# Patient Record
Sex: Female | Born: 1970 | Race: White | Hispanic: No | Marital: Married | Smoking: Former smoker
Health system: Southern US, Community
[De-identification: ages and names within clinical notes are randomized; demographics above are authoritative.]

## PROBLEM LIST (undated history)

## (undated) DIAGNOSIS — K829 Disease of gallbladder, unspecified: Secondary | ICD-10-CM

## (undated) DIAGNOSIS — G4730 Sleep apnea, unspecified: Secondary | ICD-10-CM

## (undated) DIAGNOSIS — Z8489 Family history of other specified conditions: Secondary | ICD-10-CM

## (undated) DIAGNOSIS — E282 Polycystic ovarian syndrome: Secondary | ICD-10-CM

## (undated) DIAGNOSIS — E785 Hyperlipidemia, unspecified: Secondary | ICD-10-CM

## (undated) DIAGNOSIS — N979 Female infertility, unspecified: Secondary | ICD-10-CM

## (undated) DIAGNOSIS — K219 Gastro-esophageal reflux disease without esophagitis: Secondary | ICD-10-CM

## (undated) DIAGNOSIS — R112 Nausea with vomiting, unspecified: Secondary | ICD-10-CM

## (undated) DIAGNOSIS — E669 Obesity, unspecified: Secondary | ICD-10-CM

## (undated) DIAGNOSIS — L738 Other specified follicular disorders: Secondary | ICD-10-CM

## (undated) DIAGNOSIS — N946 Dysmenorrhea, unspecified: Secondary | ICD-10-CM

## (undated) DIAGNOSIS — Z9889 Other specified postprocedural states: Secondary | ICD-10-CM

## (undated) DIAGNOSIS — J189 Pneumonia, unspecified organism: Secondary | ICD-10-CM

## (undated) DIAGNOSIS — F32A Depression, unspecified: Secondary | ICD-10-CM

## (undated) DIAGNOSIS — I456 Pre-excitation syndrome: Secondary | ICD-10-CM

## (undated) DIAGNOSIS — Z87442 Personal history of urinary calculi: Secondary | ICD-10-CM

## (undated) DIAGNOSIS — M1990 Unspecified osteoarthritis, unspecified site: Secondary | ICD-10-CM

## (undated) DIAGNOSIS — R519 Headache, unspecified: Secondary | ICD-10-CM

## (undated) HISTORY — DX: Obesity, unspecified: E66.9

## (undated) HISTORY — PX: LAPAROSCOPIC CHOLECYSTECTOMY: SUR755

## (undated) HISTORY — DX: Other specified follicular disorders: L73.8

## (undated) HISTORY — DX: Disease of gallbladder, unspecified: K82.9

## (undated) HISTORY — DX: Hyperlipidemia, unspecified: E78.5

## (undated) HISTORY — DX: Dysmenorrhea, unspecified: N94.6

## (undated) HISTORY — DX: Female infertility, unspecified: N97.9

## (undated) HISTORY — PX: OTHER SURGICAL HISTORY: SHX169

## (undated) HISTORY — DX: Polycystic ovarian syndrome: E28.2

## (undated) HISTORY — PX: SLEEVE GASTROPLASTY: SHX1101

---

## 2006-04-14 ENCOUNTER — Ambulatory Visit: Payer: Self-pay | Admitting: Family Medicine

## 2006-05-19 DIAGNOSIS — I456 Pre-excitation syndrome: Secondary | ICD-10-CM | POA: Insufficient documentation

## 2006-05-19 DIAGNOSIS — F339 Major depressive disorder, recurrent, unspecified: Secondary | ICD-10-CM | POA: Insufficient documentation

## 2006-05-19 DIAGNOSIS — E282 Polycystic ovarian syndrome: Secondary | ICD-10-CM | POA: Insufficient documentation

## 2006-05-19 DIAGNOSIS — E669 Obesity, unspecified: Secondary | ICD-10-CM | POA: Insufficient documentation

## 2006-07-07 ENCOUNTER — Ambulatory Visit: Payer: Self-pay | Admitting: Family Medicine

## 2006-08-21 ENCOUNTER — Encounter: Payer: Self-pay | Admitting: Family Medicine

## 2006-08-21 LAB — CONVERTED CEMR LAB
AST: 20 units/L
Albumin: 3.9 g/dL
Alkaline Phosphatase: 88 units/L
BUN: 7 mg/dL
Calcium: 8.7 mg/dL
HCT: 44 %
Hemoglobin: 15.2 g/dL
LDL Cholesterol: 111 mg/dL
Potassium: 4.4 meq/L
Sodium: 144 meq/L

## 2006-08-24 ENCOUNTER — Ambulatory Visit: Payer: Self-pay | Admitting: Family Medicine

## 2006-09-02 ENCOUNTER — Ambulatory Visit: Payer: Self-pay | Admitting: Cardiology

## 2006-09-02 ENCOUNTER — Encounter: Payer: Self-pay | Admitting: Family Medicine

## 2006-09-09 ENCOUNTER — Ambulatory Visit: Payer: Self-pay | Admitting: Cardiology

## 2006-09-15 ENCOUNTER — Encounter: Payer: Self-pay | Admitting: Cardiology

## 2006-09-15 ENCOUNTER — Ambulatory Visit: Payer: Self-pay

## 2006-09-15 HISTORY — PX: OTHER SURGICAL HISTORY: SHX169

## 2006-10-02 ENCOUNTER — Encounter: Payer: Self-pay | Admitting: Family Medicine

## 2006-10-07 ENCOUNTER — Ambulatory Visit: Payer: Self-pay | Admitting: Family Medicine

## 2006-10-12 ENCOUNTER — Encounter: Payer: Self-pay | Admitting: Family Medicine

## 2006-11-04 ENCOUNTER — Telehealth: Payer: Self-pay | Admitting: Family Medicine

## 2006-11-04 ENCOUNTER — Ambulatory Visit: Payer: Self-pay | Admitting: Cardiology

## 2006-11-05 ENCOUNTER — Telehealth: Payer: Self-pay | Admitting: Family Medicine

## 2006-11-24 ENCOUNTER — Ambulatory Visit: Payer: Self-pay | Admitting: Internal Medicine

## 2006-12-03 ENCOUNTER — Ambulatory Visit: Payer: Self-pay | Admitting: Family Medicine

## 2007-11-02 ENCOUNTER — Encounter: Payer: Self-pay | Admitting: Family Medicine

## 2008-07-26 ENCOUNTER — Encounter: Payer: Self-pay | Admitting: Family Medicine

## 2008-08-31 ENCOUNTER — Ambulatory Visit: Payer: Self-pay | Admitting: Family Medicine

## 2008-08-31 LAB — CONVERTED CEMR LAB: Rapid Strep: NEGATIVE

## 2008-11-29 ENCOUNTER — Ambulatory Visit: Payer: Self-pay | Admitting: Family Medicine

## 2008-11-29 DIAGNOSIS — L919 Hypertrophic disorder of the skin, unspecified: Secondary | ICD-10-CM

## 2008-11-29 DIAGNOSIS — L909 Atrophic disorder of skin, unspecified: Secondary | ICD-10-CM | POA: Insufficient documentation

## 2009-04-20 ENCOUNTER — Ambulatory Visit: Payer: Self-pay | Admitting: Family Medicine

## 2009-05-16 ENCOUNTER — Encounter: Payer: Self-pay | Admitting: Family Medicine

## 2009-05-17 ENCOUNTER — Encounter: Payer: Self-pay | Admitting: Family Medicine

## 2009-05-18 ENCOUNTER — Encounter: Payer: Self-pay | Admitting: Family Medicine

## 2009-06-05 ENCOUNTER — Ambulatory Visit: Payer: Self-pay | Admitting: Family Medicine

## 2009-07-19 ENCOUNTER — Ambulatory Visit: Payer: Self-pay | Admitting: Family Medicine

## 2009-07-25 ENCOUNTER — Ambulatory Visit: Payer: Self-pay | Admitting: Family Medicine

## 2009-07-25 DIAGNOSIS — J029 Acute pharyngitis, unspecified: Secondary | ICD-10-CM | POA: Insufficient documentation

## 2009-07-25 DIAGNOSIS — M546 Pain in thoracic spine: Secondary | ICD-10-CM | POA: Insufficient documentation

## 2009-07-25 LAB — CONVERTED CEMR LAB: Rapid Strep: NEGATIVE

## 2009-07-26 ENCOUNTER — Encounter: Payer: Self-pay | Admitting: Family Medicine

## 2009-07-27 LAB — CONVERTED CEMR LAB
Alkaline Phosphatase: 72 units/L (ref 39–117)
BUN: 8 mg/dL (ref 6–23)
Basophils Absolute: 0 10*3/uL (ref 0.0–0.1)
Basophils Relative: 0 % (ref 0–1)
Creatinine, Ser: 0.87 mg/dL (ref 0.40–1.20)
Eosinophils Absolute: 0.1 10*3/uL (ref 0.0–0.7)
Eosinophils Relative: 2 % (ref 0–5)
Glucose, Bld: 97 mg/dL (ref 70–99)
Hemoglobin: 15.4 g/dL — ABNORMAL HIGH (ref 12.0–15.0)
MCHC: 34.4 g/dL (ref 30.0–36.0)
Monocytes Absolute: 0.4 10*3/uL (ref 0.1–1.0)
Neutro Abs: 4.7 10*3/uL (ref 1.7–7.7)
RDW: 12.5 % (ref 11.5–15.5)
Sodium: 137 meq/L (ref 135–145)
Total Bilirubin: 0.9 mg/dL (ref 0.3–1.2)

## 2009-07-31 ENCOUNTER — Telehealth: Payer: Self-pay | Admitting: Family Medicine

## 2009-08-14 ENCOUNTER — Encounter: Payer: Self-pay | Admitting: Family Medicine

## 2009-08-20 ENCOUNTER — Ambulatory Visit: Payer: Self-pay | Admitting: Family Medicine

## 2009-09-06 ENCOUNTER — Encounter: Payer: Self-pay | Admitting: Family Medicine

## 2009-09-07 LAB — CONVERTED CEMR LAB
Cholesterol: 163 mg/dL (ref 0–200)
HDL: 30 mg/dL — ABNORMAL LOW (ref >39)
Total CHOL/HDL Ratio: 5.4
Triglycerides: 182 mg/dL — ABNORMAL HIGH (ref <150)
VLDL: 36 mg/dL (ref 0–40)

## 2009-09-20 ENCOUNTER — Ambulatory Visit: Payer: Self-pay | Admitting: Family Medicine

## 2009-10-08 ENCOUNTER — Ambulatory Visit: Payer: Self-pay | Admitting: Family Medicine

## 2009-10-18 ENCOUNTER — Ambulatory Visit: Payer: Self-pay | Admitting: Family Medicine

## 2009-11-07 ENCOUNTER — Ambulatory Visit: Payer: Self-pay | Admitting: Internal Medicine

## 2009-11-13 ENCOUNTER — Encounter: Payer: Self-pay | Admitting: Family Medicine

## 2010-02-19 ENCOUNTER — Ambulatory Visit: Payer: Self-pay | Admitting: Family Medicine

## 2010-02-19 DIAGNOSIS — L0291 Cutaneous abscess, unspecified: Secondary | ICD-10-CM | POA: Insufficient documentation

## 2010-02-19 DIAGNOSIS — J209 Acute bronchitis, unspecified: Secondary | ICD-10-CM | POA: Insufficient documentation

## 2010-02-19 DIAGNOSIS — L0390 Cellulitis, unspecified: Secondary | ICD-10-CM

## 2010-03-06 ENCOUNTER — Ambulatory Visit: Payer: Self-pay | Admitting: Family Medicine

## 2010-03-06 DIAGNOSIS — D239 Other benign neoplasm of skin, unspecified: Secondary | ICD-10-CM | POA: Insufficient documentation

## 2010-04-23 ENCOUNTER — Ambulatory Visit: Payer: Self-pay | Admitting: Family Medicine

## 2010-04-23 DIAGNOSIS — J189 Pneumonia, unspecified organism: Secondary | ICD-10-CM | POA: Insufficient documentation

## 2010-05-01 ENCOUNTER — Ambulatory Visit: Payer: Self-pay | Admitting: Family Medicine

## 2010-05-01 ENCOUNTER — Encounter: Admission: RE | Admit: 2010-05-01 | Discharge: 2010-05-01 | Payer: Self-pay | Admitting: Family Medicine

## 2010-06-03 ENCOUNTER — Ambulatory Visit: Payer: Self-pay | Admitting: Family Medicine

## 2010-06-03 DIAGNOSIS — J309 Allergic rhinitis, unspecified: Secondary | ICD-10-CM | POA: Insufficient documentation

## 2010-06-03 DIAGNOSIS — J45909 Unspecified asthma, uncomplicated: Secondary | ICD-10-CM | POA: Insufficient documentation

## 2010-06-03 LAB — CONVERTED CEMR LAB
IgE (Immunoglobulin E), Serum: 66.6 intl units/mL (ref 0.0–180.0)
MCHC: 35.1 g/dL (ref 30.0–36.0)
MCV: 92 fL (ref 78.0–100.0)
Neutrophils Relative %: 66 % (ref 43–77)
Platelets: 252 10*3/uL (ref 150–400)

## 2010-09-10 NOTE — Assessment & Plan Note (Signed)
Summary: cough   Vital Signs:  Patient profile:   40 year old female Height:      65 inches Weight:      279 pounds BMI:     46.60 O2 Sat:      96 % on Room air Temp:     98.8 degrees F oral Pulse rate:   70 / minute BP sitting:   122 / 67  (left arm) Cuff size:   large  Vitals Entered By: Payton Spark CMA (May 01, 2010 9:57 AM)  O2 Flow:  Room air CC: Cough, chest congestion and fever.   Primary Care Provider:  Seymour Bars DO  CC:  Cough and chest congestion and fever.Marland Kitchen  History of Present Illness: Laura [name] is a 40 year-old female with a history of 4/5 of her children having pneumonia who was treated emperically for community acquired pneumonia last week.  She states that she has barely noticed any improvement of her symptoms after completing the anitbiotic course.  The patient notes that her fever has decreased some but still is having a lot of symptoms.  She says that she is starting to cough up yellowish phelgm, still feels very weak, has headaches, chills and chest tightness during her cough and breathing and a sore throat.  She denies any pain while breathing.   Current Medications (verified): 1)  Metformin Hcl 750 Mg Xr24h-Tab (Metformin Hcl) .... Take One Tablet By Mouth Twice Daily. 2)  Multivitamins   Tabs (Multiple Vitamin) .... Take One Tablet By Mouth Once Daily. 3)  Hydrocodone-Homatropine 5-1.5 Mg Tabs (Hydrocodone-Homatropine) .... 5 Ml By Mouth At Bedtime As Needed Cough  Allergies (verified): 1)  ! Pcn 2)  ! Neomycin  Past History:  Past Medical History: Reviewed history from 07/24/2008 and no changes required. 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  WPW FIRST DIAGNOSIS [id number]      SX WITH WPW:  RAPID HR, NO DYSPNEA, NO CHEST PAIN RECOMMENDATION AT [facility] 11/24/06 -- EP STUDY, RFCA OF WPW  --PT WILL LET us KNOW POLYCYSTIC OVARY DISEASE ALLERGIC RHINITIS DYSMENORRHEA DYSLIPIDEMIA OBESITY GALLBLADDER DISEASE INFERTILITY  [id number] - SINGLETON AND  QUADS!  Social History: Reviewed history from 07/24/2008 and no changes required. 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 QUIT SMOKING AFTER 5 PK/YR. UNHAPPY WITH WEIGHT GAIN.  NO EXERCISE.  DOES NOT WANT CONTRACEPTION.  Review of Systems      See HPI  Physical Exam  General:  alert, well-developed, well-nourished, and well-hydrated.   Head:  normocephalic and atraumatic.   Eyes:  conjunctiva clear Nose:  no nasal discharge.   Mouth:  pharynx pink and moist.   Neck:  no masses.   Lungs:  Normal respiratory effort, chest expands symmetrically. Lungs are clear to auscultation, no crackles, dry hacking cough with exp wheeze, nonlabored Heart:  normal rate, regular rhythm, and no murmur.   Skin:  color normal.   Cervical Nodes:  shotty tender submandibular LNs   Impression & Recommendations:  Problem # 1:  PNEUMONIA, ATYPICAL (ICD-486) Clinically, she was treated for atypical pneumonia last visit given her presentation and home exposure.  She took Zithromax from 9-13--> 9/18, so it may still be working for her.  Since she feels worse with SOB, will get a CXR today.  start on Prednisone to help with SOB + use the ProAir HFA given last time 4 x a day for the next wk.  F/U CXR results  this afternoon.   The following medications were removed from the medication list:    Zithromax Z-pak 250 Mg Tabs (Azithromycin) .Marland Kitchen... 2 tabs by mouth x 1 day then 1 tab by mouth daily x 4 days  Orders: T-DG Chest 2 View (71020)  Complete Medication List: 1)  Metformin Hcl 750 Mg Xr24h-tab (Metformin hcl) .... Take one tablet by mouth twice daily. 2)  Multivitamins Tabs (Multiple vitamin) .... Take one tablet by mouth once daily. 3)  Hydrocodone-homatropine 5-1.5 Mg Tabs (Hydrocodone-homatropine) .... 5 ml by mouth at bedtime as needed cough 4)  Prednisone 20 Mg Tabs (Prednisone) .... 2 tabs by mouth qam x 5 days 5)  Mucinex 600 Mg  Xr12h-tab (Guaifenesin) .... 2 tabs by mouth q 12 hrs as needed cough  Patient Instructions: 1)  CXR downstairs today. 2)  Will call you w/ results this afternoon. 3)  Add additional antibiotics if indicated. 4)  Start on Prednsione 40 mg every AM for shortness of breath x 5 days. 5)  Use Inhaler 2 puffs 4 x a day for the next wk. 6)  Use Mucinex 1-2 tabs every 12 hrs for chest congestion. Prescriptions: PREDNISONE 20 MG TABS (PREDNISONE) 2 tabs by mouth qAM x 5 days  #10 x 0   Entered and Authorized by:   Seymour Bars DO   Signed by:   Seymour Bars DO on 05/01/2010   Method used:   Electronically to        [facility] [phone]* (retail)       [address] [place], [place]  [address]       Ph: [phone] or [phone]       Fax: [phone]   RxID:   [id number]

## 2010-09-10 NOTE — Assessment & Plan Note (Signed)
Summary: mole removal   Vital Signs:  Patient profile:   40 year old female Height:      65 inches Weight:      272 pounds BMI:     45.43 O2 Sat:      97 % on Room air Pulse rate:   75 / minute BP sitting:   103 / 62  (left arm) Cuff size:   large  Vitals Entered By: Payton Spark CMA (March 06, 2010 9:57 AM)  O2 Flow:  Room air CC: Mole removal    CC:  Mole removal .  Current Medications (verified): 1)  Metformin Hcl 750 Mg Xr24h-Tab (Metformin Hcl) .... Take One Tablet By Mouth Twice Daily. 2)  Multivitamins   Tabs (Multiple Vitamin) .... Take One Tablet By Mouth Once Daily.  Allergies (verified): 1)  ! Pcn 2)  ! Neomycin   Impression & Recommendations:  Problem # 1:  NEVUS, ATYPICAL (ICD-216.9)  Shave biopsy done today w/o complication. F/U path results on Monday. Local wound care discussed.  Call if any problems with bleeding, redness, pus, fevers.  Orders: Shave Skin Lesion 0.6-1.0 cm/trunk/arm/leg (11301)  Complete Medication List: 1)  Metformin Hcl 750 Mg Xr24h-tab (Metformin hcl) .... Take one tablet by mouth twice daily. 2)  Multivitamins Tabs (Multiple vitamin) .... Take one tablet by mouth once daily.  Procedure Note  Biopsy: Biopsy Type: Skin The patient complains of irritation and changing lesion but denies pain and fever. Indication: changing lesion Consent signed: yes  Procedure # 1: shave biopsy    Size (in cm): 0.5 x 0.6    Region: L upper back    Comment: Changing raised mole, itchy x months.  Lesion cleaned with betadine and alchohol.  1.5 cc of 1% lidocain with epi placed for local anesthesia.  Lesion easily removed with 15 blade.  Hemostasis achieved with aluminum chloride.   Bactroban and a bandage applied.    Instrument used: #15 blade    Anesthesia: 1% lidocaine w/epinephrine [id number] ex 11/12  Cleaned and prepped with: alcohol and betadine Wound dressing: bacitracin and bandaid Instructions: daily dressing changes Additional  Instructions: Lesion sent to path.  Will f/u results next wk.  Clean daily soap and water.  Cover with bacitracin and a bandaid x 10 days.  Call if any sign of infection.

## 2010-09-10 NOTE — Letter (Signed)
Summary: [facility]  [facility]   Imported By: Lanelle Bal 12/05/2009 08:51:04  _____________________________________________________________________  External Attachment:    Type:   Image     Comment:   External Document

## 2010-09-10 NOTE — Assessment & Plan Note (Signed)
Summary: WEIGHT CHECK  Nurse Visit   Vital Signs:  Patient profile:   40 year old female Weight:      265 pounds BMI:     44.26 Pulse rate:   75 / minute BP sitting:   116 / 64  Vitals Entered By: Payton Spark CMA (October 18, 2009 9:04 AM)  Pt states she just restarted phentermine.  Allergies: 1)  ! Pcn 2)  ! Neomycin  Orders Added: 1)  Est. Patient Level I [82956]    Impression & Recommendations:  Problem # 1:  OBESITY, NOS (ICD-278.00) Weight unchanged.  BP good.  I will not RF her Phentermine due to lack of proven wt loss.  Orders: Est. Patient Level I (21308)  Complete Medication List: 1)  Phentermine Hcl 37.5 Mg Caps (Phentermine hcl) .Marland Kitchen.. 1 capsule by mouth daily on empty stomach 2)  Zithromax Z-pak 250 Mg Tabs (Azithromycin) .... 2 tabs by mouth x 1 day then 1 tab by mouth daily x 4 days   Appended Document: WEIGHT CHECK LMOM informing Pt

## 2010-09-10 NOTE — Assessment & Plan Note (Signed)
Summary: NURSE VISIT- weight check  Nurse Visit   Vital Signs:  Patient profile:   40 year old female Height:      65 inches Weight:      269 pounds  Vitals Entered By: Payton Spark CMA (August 20, 2009 9:05 AM)  Allergies: 1)  ! Pcn 2)  ! Neomycin  Orders Added: 1)  Est. Patient Level I [27253] 2)  T-Lipid Profile [[id number]] Prescriptions: PHENTERMINE HCL 37.5 MG CAPS (PHENTERMINE HCL) 1 capsule by mouth daily on empty stomach  #30 x 0   Entered and Authorized by:   Seymour Bars DO   Signed by:   Seymour Bars DO on 08/20/2009   Method used:   Printed then faxed to ...       [facility]  [place] [phone][facility] (retail)       [address] [place], [place]  [address]       Ph: [phone] or [phone]       Fax: [phone]   RxID:   [id number]    Weight check.   Impression & Recommendations:  Problem # 1:  OBESITY, NOS (ICD-278.00) Weight improved from 275--> 269 in 1 month with a good BP. Continue Phentermine for month #2. Schedule OV in 1 month.  Complete Medication List: 1)  Phentermine Hcl 37.5 Mg Caps (Phentermine hcl) .Marland Kitchen.. 1 capsule by mouth daily on empty stomach 2)  Betamethasone Valerate 0.1 % Oint (Betamethasone valerate) .... Apply to hand rash two times a day x 10 days 3)  Dukes Magic Mouthwash  .Marland Kitchen.. 10 ml swish and gargle 4 x a day as needed  Other Orders: T-Lipid Profile ([id number])   Patient Instructions: 1)  6 # lost from 12/9. 2)  275--> 269. 3)  Stay on Phentermine. 4)  Work on [employer], regular exercise 1 hr 5 days/ wk. 5)  Return for an office visit in 1 month.

## 2010-09-10 NOTE — Assessment & Plan Note (Signed)
Summary: dx:wpw/lg   Visit Type:  Follow-up Primary Provider:  Seymour Bars DO   History of Present Illness: Laura [name] returns today for additional evaluation and management of WPW syndrome.  I saw her last two years ago when she was referred by Dr. Jens Som.  She has longstanding palpitations and documented WPW for over 25 yrs.  She has recently become more symptomatic and presented to the outpatient center two weeks ago with SVT which ultimately converted before an ECG could be obtained.  She now would like to proceed with ablation.  Current Medications (verified): 1)  Metformin Hcl 750 Mg Xr24h-Tab (Metformin Hcl) .... Take One Tablet By Mouth Twice Daily. 2)  Multivitamins   Tabs (Multiple Vitamin) .... Take One Tablet By Mouth Once Daily.  Allergies (verified): 1)  ! Pcn 2)  ! 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  WPW FIRST DIAGNOSIS [phone]      SX WITH WPW:  RAPID HR, NO DYSPNEA, NO CHEST PAIN RECOMMENDATION AT [facility] 11/24/06 -- EP STUDY, RFCA OF WPW  --PT WILL LET us KNOW POLYCYSTIC OVARY DISEASE ALLERGIC RHINITIS DYSMENORRHEA DYSLIPIDEMIA OBESITY GALLBLADDER DISEASE INFERTILITY  [id number] - SINGLETON AND QUADS!  Past Surgical History: Last updated: 07/24/2008 lap chole  LTCS x 2  Review of Systems  The patient denies chest pain, syncope, dyspnea on exertion, and peripheral edema.    Vital Signs:  Patient profile:   40 year old female Height:      65 inches Weight:      266 pounds BMI:     44.42 Pulse rate:   84 / minute BP sitting:   102 / 80  (left arm)  Vitals Entered By: Laurance Flatten CMA (November 07, 2009 10:19 AM)  Physical Exam  General:  alert, appropriate dress, cooperative to examination, and overweight-appearing.   Head:  normocephalic and atraumatic.   Eyes:  conjunctiva clear Mouth:  pharyngeal erythema and postnasal drip.   Neck:  no masses.   Lungs:  normal respiratory  effort, no accessory muscle use,  Even chest expansion. normal respiratory effort, no accessory muscle use, normal breath sounds, dry cough with slight exp wheeze.   Heart:  RRR w/o tachycardia. Normal S1 and S2.  PMI is not enlarged or laterally displaced. Abdomen:  Bowel sounds positive,abdomen soft and non-tender without masses, organomegaly or hernias noted. Pulses:  R radial normal and L radial normal.   Extremities:  trace pedal edema Neurologic:  no tremor   EKG  Procedure date:  11/07/2009  Findings:      Normal sinus rhythm with rate of:  84 with WPW syndrome.  Impression & Recommendations:  Problem # 1:  WOLFF (WOLFE)-PARKINSON-WHITE (WPW) SYNDROME (ICD-426.7) The patient remains symptomatic. She wishes to proceed with ablation. I have discussed the risks/benefits/goals/expectations of EPS/RFA of WPW and she wishes to proceed.

## 2010-09-10 NOTE — Assessment & Plan Note (Signed)
Summary: f/u wt   Vital Signs:  Patient profile:   40 year old female Height:      65 inches Weight:      265 pounds BMI:     44.26 O2 Sat:      97 % on Room air Temp:     98.7 degrees F oral Pulse rate:   90 / minute BP sitting:   118 / 72  (right arm) Cuff size:   large  Vitals Entered By: Payton Spark CMA/april (September 20, 2009 10:57 AM)  O2 Flow:  Room air CC: f/u on weight   Primary Care Provider:  Seymour Bars DO  CC:  f/u on weight.  History of Present Illness: Laura [name] presents for f/u weight managment.  Her weight is down from 288 in Sept 2010.  She feels like she has more control over her eating habits.  She has cut back on portion sizes.  She has started going to the gym.  Her energy level has improved.  Eating less junk.  She has done a version of the [facility] Diet.  Denies palpitations, insomnia or tremor.  She has been on Phentermine for 2 mos now.  She was 135 prior to her pregnancy with the Quads.      Allergies: 1)  ! Pcn 2)  ! Neomycin  Past History:  Past Medical History: Reviewed history from 07/24/2008 and no changes required. 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  WPW FIRST DIAGNOSIS [phone]      SX WITH WPW:  RAPID HR, NO DYSPNEA, NO CHEST PAIN RECOMMENDATION AT [facility] 11/24/06 -- EP STUDY, RFCA OF WPW  --PT WILL LET us KNOW POLYCYSTIC OVARY DISEASE ALLERGIC RHINITIS DYSMENORRHEA DYSLIPIDEMIA OBESITY GALLBLADDER DISEASE INFERTILITY  [id number] - SINGLETON AND QUADS!  Social History: Reviewed history from 07/24/2008 and no changes required. 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 QUIT SMOKING AFTER 5 PK/YR. UNHAPPY WITH WEIGHT GAIN.  NO EXERCISE.  DOES NOT WANT CONTRACEPTION.  Review of Systems      See HPI  Physical Exam  General:  alert, well-developed, well-nourished, and well-hydrated.  obese Head:  normocephalic and atraumatic.   Mouth:  pharynx pink and moist.     Neck:  no masses.   Lungs:  Normal respiratory effort, chest expands symmetrically. Lungs are clear to auscultation, no crackles or wheezes. Heart:  Normal rate and regular rhythm. S1 and S2 normal without gallop, murmur, click, rub or other extra sounds. Neurologic:  no tremor Psych:  good eye contact, not anxious appearing, and not depressed appearing.     Impression & Recommendations:  Problem # 1:  OBESITY, NOS (ICD-278.00) Co Morbidities include acid reflux, back pain, PCOS.  BMI is down to 44.  Still class III obesity.  Wt loss has been slow, down 15 lbs in 5 mos.  Just completed 2nd month of Phentermine which has helped curb her appetite w/o adverse SE.  Will continue.  RTC next month for wt/ BP check.  Needs to step up the exercise.  Cotninue healthy diet, small portions, low carb.  Complete Medication List: 1)  Phentermine Hcl 37.5 Mg Caps (Phentermine hcl) .Marland Kitchen.. 1 capsule by mouth daily on empty stomach 2)  Betamethasone Valerate 0.1 % Oint (Betamethasone valerate) .... Apply to hand rash two times a day x 10 days 3)  Dukes Magic Mouthwash  .Marland Kitchen.. 10 ml swish and gargle 4 x a day as needed  Patient Instructions: 1)  RF Phentermine. 2)  Keep up the good work with low carb diet, sticking to  ~1400 kcal/ day.   3)  Try to exercise 1 hr (cardio + wts) 4 days/ wk. 4)  F/U in 1 month for a weight check. Prescriptions: PHENTERMINE HCL 37.5 MG CAPS (PHENTERMINE HCL) 1 capsule by mouth daily on empty stomach  #30 x 0   Entered and Authorized by:   Seymour Bars DO   Signed by:   Seymour Bars DO on 09/20/2009   Method used:   Printed then faxed to ...       [facility]  [place] [phone][facility] (retail)       [address] [place], [place]  [address]       Ph: [phone] or [phone]       Fax: [phone]   RxID:   [id number]

## 2010-09-10 NOTE — Assessment & Plan Note (Signed)
Summary: bronchitis/ boil   Vital Signs:  Patient profile:   40 year old female Height:      65 inches Weight:      269 pounds BMI:     44.93 O2 Sat:      96 % on Room air Temp:     98.9 degrees F oral Pulse rate:   84 / minute BP sitting:   107 / 62  (left arm) Cuff size:   large  Vitals Entered By: Payton Spark CMA (February 19, 2010 11:42 AM)  O2 Flow:  Room air CC: Chest congestion, boil on L breast and check mole.    Primary Care Provider:  Seymour Bars DO  CC:  Chest congestion and boil on L breast and check mole. Marland Kitchen  History of Present Illness: 40 yo WF presents for chest congestion and cough x 5 days.  No sore throat or runny nose.  Had a fever last night - subjective with sweating at night.  Having chills.  No production with cough.  Has some chest tightness.  No SOB.  She is taking only Advil.  has a HA.  Denies any rash or sick contacts.  Denies sinus pressure.  She has a boil on her L breast under the nipple that started about 12 days ago.  It has ruptured and is draining clear fluid.  It is slightly tender.  She is keeping it clean and using topical bacitracin ointment.  She also has a irritating mole on her back.  Current Medications (verified): 1)  Metformin Hcl 750 Mg Xr24h-Tab (Metformin Hcl) .... Take One Tablet By Mouth Twice Daily. 2)  Multivitamins   Tabs (Multiple Vitamin) .... Take One Tablet By Mouth Once Daily.  Allergies (verified): 1)  ! Pcn 2)  ! Neomycin  Past History:  Past Medical History: Reviewed history from 07/24/2008 and no changes required. 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  WPW FIRST DIAGNOSIS [phone]      SX WITH WPW:  RAPID HR, NO DYSPNEA, NO CHEST PAIN RECOMMENDATION AT [facility] 11/24/06 -- EP STUDY, RFCA OF WPW  --PT WILL LET us KNOW POLYCYSTIC OVARY DISEASE ALLERGIC RHINITIS DYSMENORRHEA DYSLIPIDEMIA OBESITY GALLBLADDER DISEASE INFERTILITY  [id number] - SINGLETON AND QUADS!  Past Surgical History: Reviewed history from 07/24/2008  and no changes required. lap chole  LTCS x 2  Social History: Reviewed history from 07/24/2008 and no changes required. 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 QUIT SMOKING AFTER 5 PK/YR. UNHAPPY WITH WEIGHT GAIN.  NO EXERCISE.  DOES NOT WANT CONTRACEPTION.  Review of Systems      See HPI  Physical Exam  General:  alert, appropriate dress, cooperative to examination, and overweight-appearing.   Head:  normocephalic and atraumatic.  sinuses NTTP Eyes:  conjunctiva clear Ears:  EACs patent; TMs translucent and gray with good cone of light and bony landmarks.  Nose:  no nasal discharge.   Mouth:  pharynx pink and moist.   Neck:  no masses.   Chest Wall:  no tenderness.   Lungs:  Normal respiratory effort, chest expands symmetrically. Lungs are clear to auscultation, no crackles or wheezes.  + bibasilar rhonchi with cough Heart:  normal rate, regular rhythm, and no murmur.   Skin:  color normal.   healing abscess under the L areola, scaley and pink, draining clear fluid flesh colored raised mole on the L upper back. Cervical Nodes:  No lymphadenopathy noted   Impression &  Recommendations:  Problem # 1:  ACUTE BRONCHITIS (ICD-466.0) Will cover both bronchitis (though probably viral) + abscess with Doxy x 7 days.  Work on supportive care measures as listed under pt instructions.  Call if cough / breathing worsens or not improved in 7 days. Her updated medication list for this problem includes:    Doxycycline Hyclate 100 Mg Tabs (Doxycycline hyclate) .Marland Kitchen... 1 tab by mouth two times a day x 7 days  Problem # 2:  CELLULITIS AND ABSCESS OF UNSPECIFIED SITE (ICD-682.9) Spontaneously draining abscess on L breast w/o erthema or pain.  Cover with 7 days of Doxy, keep clean with soap and water.  Call if any worsening. Her updated medication list for this problem includes:    Doxycycline Hyclate 100 Mg Tabs (Doxycycline  hyclate) .Marland Kitchen... 1 tab by mouth two times a day x 7 days  Complete Medication List: 1)  Metformin Hcl 750 Mg Xr24h-tab (Metformin hcl) .... Take one tablet by mouth twice daily. 2)  Multivitamins Tabs (Multiple vitamin) .... Take one tablet by mouth once daily. 3)  Doxycycline Hyclate 100 Mg Tabs (Doxycycline hyclate) .Marland Kitchen.. 1 tab by mouth two times a day x 7 days  Patient Instructions: 1)  Take Doxycycline 2 x a day x 7 days for bronchitis and healing breast abscess. 2)  Keep clean with soap and water and use topical bacitracin. 3)  OK to use OTC Tylenol Cold and Flu for symptomatic relief. 4)  Call if not improved in 7 days. 5)  Schedule MOLE REMOVAL ( 30 min visit) in 2 wks. Prescriptions: DOXYCYCLINE HYCLATE 100 MG TABS (DOXYCYCLINE HYCLATE) 1 tab by mouth two times a day x 7 days  #14 x 0   Entered and Authorized by:   Seymour Bars DO   Signed by:   Seymour Bars DO on 02/19/2010   Method used:   Electronically to        [facility] [phone]* (retail)       [address] [place], [place]  [address]       Ph: [phone] or [phone]       Fax: [phone]   RxID:   [id number]

## 2010-09-10 NOTE — Assessment & Plan Note (Signed)
Summary: cough   Vital Signs:  Patient profile:   40 year old female Height:      65 inches Weight:      284 pounds BMI:     47.43 O2 Sat:      97 % on Room air Temp:     97.9 degrees F oral Pulse rate:   72 / minute BP sitting:   123 / 69  (left arm) Cuff size:   large  Vitals Entered By: Payton Spark CMA (June 03, 2010 10:43 AM)  O2 Flow:  Room air  Serial Vital Signs/Assessments:  Comments: 11:15 AM Peak Flows [address] By: Payton Spark CMA   CC: Chest congestion and cough   Primary Care Provider:  Seymour Bars DO  CC:  Chest congestion and cough.  History of Present Illness: 40 yo WF presents for a reoccurence of chest congestion.  She started on Mucinex.  She has a dry cough.  This started 2 days ago.  No fevers or chills.  Has fatigue.  She has some nasal congestion.  No sore throat.  Denies SOB or wheezing.  She is not taking anything for allergies.  She was tested 2-3 yrs ago.  She was treated with antibiotics in the past 4 wks for atypical pneumonia and did improve.  She also took a Prednisone burst which helped.  Denies sputum production or hemoptysis.  She is not a smoker.  Had a CXR done recently that was c/w a viral pneumonia picture.     Current Medications (verified): 1)  Metformin Hcl 750 Mg Xr24h-Tab (Metformin Hcl) .... Take One Tablet By Mouth Twice Daily. 2)  Multivitamins   Tabs (Multiple Vitamin) .... Take One Tablet By Mouth Once Daily. 3)  Hydrocodone-Homatropine 5-1.5 Mg Tabs (Hydrocodone-Homatropine) .... 5 Ml By Mouth At Bedtime As Needed Cough 4)  Mucinex 600 Mg Xr12h-Tab (Guaifenesin) .... 2 Tabs By Mouth Q 12 Hrs As Needed Cough  Allergies (verified): 1)  ! Pcn 2)  ! Neomycin  Past History:  Past Medical History: Reviewed history from 07/24/2008 and no changes required. 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  WPW FIRST DIAGNOSIS [phone]      SX WITH WPW:  RAPID HR, NO DYSPNEA, NO CHEST PAIN RECOMMENDATION AT [facility] 11/24/06 --  EP STUDY, RFCA OF WPW  --PT WILL LET us KNOW POLYCYSTIC OVARY DISEASE ALLERGIC RHINITIS DYSMENORRHEA DYSLIPIDEMIA OBESITY GALLBLADDER DISEASE INFERTILITY  [id number] - SINGLETON AND QUADS!  Social History: Reviewed history from 07/24/2008 and no changes required. 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 QUIT SMOKING AFTER 5 PK/YR. UNHAPPY WITH WEIGHT GAIN.  NO EXERCISE.  DOES NOT WANT CONTRACEPTION.  Review of Systems      See HPI  Physical Exam  General:  alert, well-developed, well-nourished, and well-hydrated.  obese Head:  normocephalic and atraumatic.   Eyes:  conjunctiva clear Ears:  EACs patent; TMs translucent and gray with good cone of light and bony landmarks.  Nose:  nasal congestion with red boggy turbinates present Mouth:  o/p injected diffusely with postnasal drip Neck:  no masses.   Chest Wall:  no tenderness.   Lungs:  dry hacking cough, normal respiratory effort, no accessory muscle use, normal breath sounds, no dullness, no crackles, and no wheezes.   Heart:  normal rate, regular rhythm, and no murmur.   Skin:  color normal.   Cervical Nodes:  No lymphadenopathy noted   Impression & Recommendations:  Problem # 1:  BRONCHITIS, ALLERGIC (ICD-493.90)  Allergies explains why she has continued to have congestion and cough for > 1 month now.  I do think that she has an infectious source at the onset but this was adequately treated with Zithromax and then steroids and her f/u CXR was + for a viral process.  Her PFs are in the green zone today so I held off on inhalers or more steroids.  I think that Mucinex DM and Zyrtec are OK.  She may need to go back to the allergies for PFTs/ allergy testing.    CBC done todya just to r/o a left shift since she has household contact with PNA and an upcoming trip to [place]. Call back if feeling any worse.   The following medications were removed from the medication  list:    Prednisone 20 Mg Tabs (Prednisone) .Marland Kitchen... 2 tabs by mouth qam x 5 days  Orders: Peak Flow Rate (94150)  Complete Medication List: 1)  Metformin Hcl 750 Mg Xr24h-tab (Metformin hcl) .... Take one tablet by mouth twice daily. 2)  Multivitamins Tabs (Multiple vitamin) .... Take one tablet by mouth once daily. 3)  Hydrocodone-homatropine 5-1.5 Mg Tabs (Hydrocodone-homatropine) .... 5 ml by mouth at bedtime as needed cough 4)  Mucinex 600 Mg Xr12h-tab (Guaifenesin) .... 2 tabs by mouth q 12 hrs as needed cough  Other Orders: T-CBC w/Diff ([id number]) T-Immunoglobulin E ([id number])  Patient Instructions: 1)  Will get labs today to help guide treatment: viral vs allergic vs bacterial etiology of your bronchitis. 2)  Will call you w/ results tonight. 3)  Use Mucinex DM for cough/ chest congestion with Zyrtec in the evenings for allergies.     Orders Added: 1)  T-CBC w/Diff [[id number]] 2)  T-Immunoglobulin E [[id number]] 3)  Est. Patient Level III [75643] 4)  Peak Flow Rate [94150]

## 2010-09-10 NOTE — Assessment & Plan Note (Signed)
Summary: flu   Vital Signs:  Patient profile:   40 year old female Height:      65 inches Weight:      265 pounds BMI:     44.26 O2 Sat:      97 % on Room air Temp:     98.5 degrees F oral Pulse rate:   81 / minute BP sitting:   106 / 71  (left arm) Cuff size:   large  Vitals Entered By: Payton Spark CMA (October 08, 2009 10:56 AM)  O2 Flow:  Room air CC: ? FLU. Body aches x 4-5 days   Primary Care Provider:  Seymour Bars DO  CC:  ? FLU. Body aches x 4-5 days.  History of Present Illness: 79 yr WF presents with flu-like symptoms. Headache, sore throat, body aches, chest congestion, and cough x5 days. She has 5 children at home that had the flu last week. She had a fever Thursday and Friday and took ibuprofen. She has taken dayquil daily since Thrusday.  On Friday she took Nyquil which caused her heart to speed up. History of WPW. Marland Kitchen She went to Urgent care Sunday and was not seen, they sent her to the ER. While in route to the ER, she felt her HR slow down and went home. She denies chest pain or SOB.  She say Dr Ladona Ridgel at [facility] in [facility] but wanted to put off ablation.    Current Medications (verified): 1)  Phentermine Hcl 37.5 Mg Caps (Phentermine Hcl) .Marland Kitchen.. 1 Capsule By Mouth Daily On Empty Stomach  Allergies (verified): 1)  ! Pcn 2)  ! Neomycin  Past History:  Past Medical History: Reviewed history from 07/24/2008 and no changes required. 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  WPW FIRST DIAGNOSIS [phone]      SX WITH WPW:  RAPID HR, NO DYSPNEA, NO CHEST PAIN RECOMMENDATION AT [facility] 11/24/06 -- EP STUDY, RFCA OF WPW  --PT WILL LET us KNOW POLYCYSTIC OVARY DISEASE ALLERGIC RHINITIS DYSMENORRHEA DYSLIPIDEMIA OBESITY GALLBLADDER DISEASE INFERTILITY  [id number] - SINGLETON AND QUADS!  Family History: Reviewed history from 07/24/2008 and no changes required.   BROTHER AND SISTER WITH THYROID DISEASE FATHER HTN MOTHER COLON CA AT 57, AMI LATE 50'S, DM,CVA,  DEPRESSION, BREAST CA  Social History: Reviewed history from 07/24/2008 and no changes required. 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 QUIT SMOKING AFTER 5 PK/YR. UNHAPPY WITH WEIGHT GAIN.  NO EXERCISE.  DOES NOT WANT CONTRACEPTION.  Review of Systems      See HPI  Physical Exam  General:  alert, appropriate dress, cooperative to examination, and overweight-appearing.   Head:  normocephalic and atraumatic.   Eyes:  conjunctiva clear Ears:  L ear normal and R TM with pronounced blood vessel (pinpoint) behind the TM. Nose:  nasal congestion Mouth:  pharyngeal erythema and postnasal drip.   Neck:  no masses.   Lungs:  normal respiratory effort, no accessory muscle use,  Even chest expansion. normal respiratory effort, no accessory muscle use, normal breath sounds, dry cough with slight exp wheeze.   Heart:  RRR w/o tachycardia Pulses:  R radial normal and L radial normal.   Skin:  color normal.   Cervical Nodes:  no anterior cervical adenopathy and no posterior cervical adenopathy.   Psych:  Oriented X3 and good eye contact.     Impression & Recommendations:  Problem # 1:  INFLUENZA, WITH RESPIRATORY SYMPTOMS (  ICD-487.1) Assessment New Clinically, appears to be at the tail end of influenza like illness, complicated by dry hacking cough and secondary WPW tachycardia from cold meds.  Due to this, will go ahead and treat her with Zithromax x 5 days.  Avoid systemic decongestants.  Call if not improved in 7 days. Zithromax x 5 days. OTC Robitussin DM and Tyelnol/Advil PRN.  Problem # 2:  WOLFF (WOLFE)-PARKINSON-WHITE (WPW) SYNDROME (ICD-426.7) Will see Dr. Ladona Ridgel to discuss possible ablation.  Last seen 20008.  Stable today.  Complete Medication List: 1)  Phentermine Hcl 37.5 Mg Caps (Phentermine hcl) .Marland Kitchen.. 1 capsule by mouth daily on empty stomach 2)  Zithromax Z-pak 250 Mg Tabs (Azithromycin) .... 2 tabs by mouth x  1 day then 1 tab by mouth daily x 4 days  Patient Instructions: 1)  Take Zithromax x 5 days. 2)  Take OTC Robitussin DM and Tyelnol or Advil as needed for remaining symptoms. 3)  Will get you back in with Dr Ladona Ridgel for WPW. Prescriptions: ZITHROMAX Z-PAK 250 MG TABS (AZITHROMYCIN) 2 tabs by mouth x 1 day then 1 tab by mouth daily x 4 days  #1 pack x 0   Entered and Authorized by:   Seymour Bars DO   Signed by:   Seymour Bars DO on 10/08/2009   Method used:   [facility]        [phone][facility] (retail)       [address] [place], [place]  [address]       Ph: [phone] or [phone]       Fax: [phone]   RxID:   [id number]

## 2010-09-10 NOTE — Consult Note (Signed)
Summary: [facility]  [facility]   Imported By: Lanelle Bal 08/23/2009 12:31:45  _____________________________________________________________________  External Attachment:    Type:   Image     Comment:   External Document

## 2010-09-10 NOTE — Miscellaneous (Signed)
Summary: Consent to Special Procedure  Consent to Special Procedure   Imported By: Lanelle Bal 03/15/2010 11:25:34  _____________________________________________________________________  External Attachment:    Type:   Image     Comment:   External Document

## 2010-09-10 NOTE — Assessment & Plan Note (Signed)
Summary: atypical pneumonia   Vital Signs:  Patient profile:   40 year old female Height:      65 inches Weight:      278 pounds BMI:     46.43 O2 Sat:      96 % on Room air Temp:     98.2 degrees F oral Pulse rate:   79 / minute BP sitting:   117 / 65  (left arm) Cuff size:   large  Vitals Entered By: Payton Spark CMA (April 23, 2010 9:54 AM)  O2 Flow:  Room air CC: fever, cough and ST. Children all w/ strep and pneumonia   Primary Care Aidin Doane:  Seymour Bars DO  CC:  fever and cough and ST. Children all w/ strep and pneumonia.  History of Present Illness: 40 yo WF presents for fever, sore throat, cough and bodyaches x 1 day. Has some runny nose that started today.  Her cough is deep but dry.  She has chest tightness and SOB.  Several of her kids have pneumonia and ST.  She took Delsym last night it did not help.  Taking Advil for fevers.  She has malaise.  Had a migraine last wk.      Current Medications (verified): 1)  Metformin Hcl 750 Mg Xr24h-Tab (Metformin Hcl) .... Take One Tablet By Mouth Twice Daily. 2)  Multivitamins   Tabs (Multiple Vitamin) .... Take One Tablet By Mouth Once Daily.  Allergies (verified): 1)  ! Pcn 2)  ! Neomycin  Past History:  Past Medical History: Reviewed history from 07/24/2008 and no changes required. 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  WPW FIRST DIAGNOSIS [phone]      SX WITH WPW:  RAPID HR, NO DYSPNEA, NO CHEST PAIN RECOMMENDATION AT [facility] 11/24/06 -- EP STUDY, RFCA OF WPW  --PT WILL LET us KNOW POLYCYSTIC OVARY DISEASE ALLERGIC RHINITIS DYSMENORRHEA DYSLIPIDEMIA OBESITY GALLBLADDER DISEASE INFERTILITY  [id number] - SINGLETON AND QUADS!  Past Surgical History: Reviewed history from 07/24/2008 and no changes required. lap chole  LTCS x 2  Social History: Reviewed history from 07/24/2008 and no changes required. 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 QUIT  SMOKING AFTER 5 PK/YR. UNHAPPY WITH WEIGHT GAIN.  NO EXERCISE.  DOES NOT WANT CONTRACEPTION.  Review of Systems      See HPI  Physical Exam  General:  alert, well-developed, well-nourished, and well-hydrated.  obese Head:  normocephalic and atraumatic.   Eyes:  conjunctiva clear Ears:  retracted R TM, o/w normal bilat Nose:  scant  nasal congestion with boggy turbinates Mouth:  o/p injected w/o exudates or vesicles Neck:  no masses.   Lungs:  Normal respiratory effort, chest expands symmetrically. Lungs are clear to auscultation, no crackles, dry hacking cough with exp wheeze, nonlabored Heart:  normal rate, regular rhythm, and no murmur.   Skin:  color normal.   Cervical Nodes:  shotty anterior cervical chain LA   Impression & Recommendations:  Problem # 1:  PNEUMONIA, ATYPICAL (ICD-486) Will treat for presumed atypical PNA given her exposure at home and constellation of symptoms. Treat with 5 days of Zithromax, supportive care and nighttime Hycodan.  RX ProAIr sample given to use 2 puffs 4 x a day for the next wk for wheezing and SOB.   Call if not starting to improve in 48 hrs or if feeling worse.   Her updated medication list for this problem includes:    Zithromax  Z-pak 250 Mg Tabs (Azithromycin) .Marland Kitchen... 2 tabs by mouth x 1 day then 1 tab by mouth daily x 4 days  Complete Medication List: 1)  Metformin Hcl 750 Mg Xr24h-tab (Metformin hcl) .... Take one tablet by mouth twice daily. 2)  Multivitamins Tabs (Multiple vitamin) .... Take one tablet by mouth once daily. 3)  Zithromax Z-pak 250 Mg Tabs (Azithromycin) .... 2 tabs by mouth x 1 day then 1 tab by mouth daily x 4 days 4)  Hydrocodone-homatropine 5-1.5 Mg Tabs (Hydrocodone-homatropine) .... 5 ml by mouth at bedtime as needed cough  Other Orders: Rapid Strep (16109)  Patient Instructions: 1)  Take Zithromax x 5 days for 'walking pneumonia'. 2)  Use OTC Advil Cold and Flu during the day and RX Hycodan at night. 3)  Use  sample PROAIR inhaler 2 puffs 4 x a day for the next wk. 4)  Call if not seeing some improvements after 48 hrs or if feeling worse. Prescriptions: HYDROCODONE-HOMATROPINE 5-1.5 MG TABS (HYDROCODONE-HOMATROPINE) 5 ml by mouth at bedtime as needed cough  #100 ml x 0   Entered and Authorized by:   Seymour Bars DO   Signed by:   Seymour Bars DO on 04/23/2010   Method used:   Printed then faxed to ...       [facility] [phone][facility] (retail)       [address] [place], [place]  [address]       Ph: [phone] or [phone]       Fax: [phone]   RxID:   [id number] ZITHROMAX Z-PAK 250 MG TABS (AZITHROMYCIN) 2 tabs by mouth x 1 day then 1 tab by mouth daily x 4 days  #1 pack x 0   Entered and Authorized by:   Seymour Bars DO   Signed by:   Seymour Bars DO on 04/23/2010   Method used:   Electronically to        [facility] [phone]* (retail)       [address] [place], [place]  [address]       Ph: [phone] or [phone]       Fax: [phone]   RxID:   [id number]   Laboratory Results    Other Tests  Rapid Strep: negative

## 2010-12-27 NOTE — Assessment & Plan Note (Signed)
[facility]                            CARDIOLOGY OFFICE NOTE   [name], [name]                       MRN:          [id number]  DATE:09/02/2006                            DOB:          24-Mar-1971    Laura [name] is a very pleasant 40 year old female with a past medical  history of Wolff-Parkinson-White Syndrome, whom we were asked to  evaluate for that and supraventricular tachycardia.  The patient's  cardiac history dates back to 11.  She apparently was pregnant and had  sustained palpitations with a heart rate up to 300.  She was found to  have WPW on electrocardiogram.  She underwent a full cardiac evaluation  at that time, including Holter monitor, echocardiogram, and stress test,  which was performed in [place].  These apparently were unremarkable  per her report.  She does not know what the results of the Holter were.  She also had increased palpitations on her last pregnancy, which was  quadruplets.  She has mild dyspnea on exertion, which she attributes to  her weight.  However, there is no orthopnea or PND.  She occasionally  has mild pedal edema.  She does not have chest pain.  She has had  intermittent palpitations for most of her life.  They are sudden in  onset and described as her heart racing.  There is no associated  shortness of breath, nausea or vomiting, chest pain, or syncope.  Because of her palpitations and WPW we were asked to further evaluate.  Note, she has these approximately 2 to 3 times per month.  She also  takes propranolol as needed, which seems to help with these problems.   She has an allergy to PENICILLIN and NEOMYCIN.   MEDICATIONS:  1. Include multivitamins.  2. Propranolol 10 mg p.o. b.i.d. p.r.n.  3. Celexa.  4. Metformin 500 mg p.o. b.i.d.   SOCIAL HISTORY:  She does not smoke and only rarely consumes alcohol.   FAMILY HISTORY:  Positive for coronary artery disease.   PAST MEDICAL HISTORY:   Negative for hypertension or diabetes mellitus.  There is mild hyperlipidemia that was recently diagnosed.  She has  polycystic ovarian disease.  She has had a prior cholecystectomy,  Cesarean section.  She has a history of WPW as described in the HPI.   REVIEW OF SYSTEMS:  She denies any headaches, fevers or chills.  There  is no productive cough or hemoptysis.  There is no dysphagia,  odynophagia, melena, or hematochezia.  There is no dysuria or hematuria.  There is no rash or seizure activity.  There is no orthopnea or PND, but  there is mild pedal edema.  The remaining systems are negative.   PHYSICAL EXAM:  Blood pressure 127/88, pulse 70.  She weighs 270 pounds.  She is well-developed, well-nourished in no acute distress.  SKIN:  Warm and dry.  She does not appear to be depressed.  There is no peripheral clubbing.  HEENT:  Unremarkable with normal eyelids.  NECK:  Supple with normal upstroke bilaterally.  I cannot appreciate  bruits.  There is  no jugular venous distension.  No thyromegaly noted.  CHEST:  Clear to auscultation with normal expansion.  CARDIOVASCULAR:  Regular rate and rhythm with normal S1, S2.  I cannot  appreciate murmurs, rubs, or gallops.  Her PMI is nondisplaced.  ABDOMEN:  Nontender.  Nondistended.  Positive bowel sounds.  No  hepatosplenomegaly.  No masses appreciated.  There is no abdominal  bruit.  She does have abdominal striae.  She has 2+ femoral pulses  bilaterally.  No bruits.  EXTREMITIES:  Trace edema bilaterally but there are no cords palpated.  She has 2+ dorsalis pedis pulses bilaterally.  NEUROLOGIC:  Grossly intact.   ELECTROCARDIOGRAM:  Sinus rhythm at a rate of 70.  The axis is normal.  There is early transition and there is evidence of a delta wave in V1  through V4.   DIAGNOSES:  1. Wolff-Parkinson-White Syndrome.  2. Probable supraventricular tachycardia.  3. Polycystic ovarian disease.  4. History of mild hyperlipidemia.   PLAN:   Laura [name] presents for evaluation of WPW and SVT.  She has  palpitations at least 3 times monthly, but these are short-lived, and  she takes propranolol as needed.  Ablation has been recommended in the  past, but she has declined and she is concerned about the possible  risks.  We again discussed this today.  We will check an  electrocardiogram to quantify her left ventricular function, and an  event monitor.  She will consider an ablation and I will see her back in  4 to 6 weeks.  If indeed she is agreeable, then we will refer her to 1  of our Electrophysiologists.     Madolyn Frieze Jens Som, MD, [facility]  Electronically Signed    BSC/MedQ  DD: 09/02/2006  DT: 09/02/2006  Job #: [id number]   cc:   Seymour Bars, D.O.

## 2010-12-27 NOTE — Assessment & Plan Note (Signed)
[facility]                         ELECTROPHYSIOLOGY OFFICE NOTE   Laura [name], NYDAM                       MRN:          [id number]  DATE:11/24/2006                            DOB:          03-25-1971    REFERRING PHYSICIAN:  Madolyn Frieze. Jens Som, MD, [facility]   The patient is referred today by Dr. Olga Millers for evaluation of  WPW syndrome.   HISTORY OF PRESENT ILLNESS:  The patient is a very pleasant, 40 year old  woman with a longstanding history of tachypalpitations and elevated  heart rates. She has a history of polycystic ovary disease and  dyslipidemia and obesity. She was diagnosed with WPW syndrome initially  approximately 15 years ago. At that time even though she was  symptomatic, it was recommended that she not proceed with ablation  because of concerns about the procedure's safety and success rate. Since  then, she has had intermittent episodes of palpitations and take beta  blockers on p.r.n. basis. Despite this, she continued to have  palpitations although they are not particularly severe. The patient  states that she has experienced rapid heart rates for a long time but  she does not get chest pain or shortness of breath. She has never had  syncope with an episode.   MEDICATIONS:  1. Propranolol 10 mg twice a day.  2. Celexa.  3. Metformin.   PAST MEDICAL HISTORY:  As noted above. She has polycystic ovary  syndrome. She has obesity. She has a history of gallbladder disease. She  is status post C section. 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 Her quadruplets  are 41 years old.   FAMILY HISTORY:  Notable for both parents being alive. No significant  medical problems except for a father with hypertension.   SOCIAL HISTORY:  The patient works as a Oceanographer. She denies chest  pain. She has a history of tobacco use but stopped smoking in 2009. She  denies alcohol abuse  and rarely drinks any alcoholic beverages.   REVIEW OF SYSTEMS:  Notable for occasional problems with fatigue,  depression, and dysmenorrhea. She has a history of seasonal allergic  rhinitis. The rest of her review of systems were negative except as  noted in the HPI.   PHYSICAL EXAMINATION:  GENERAL:  She is a pleasant, well-appearing,  obese woman in no acute distress.  VITAL SIGNS:  The blood pressure was 137/89, the pulse 86 and regular,  the respirations were 18, the weight was 282 pounds.  HEENT:  Normocephalic, atraumatic. Pupils equal and round. The  oropharynx was moist. Sclera anicteric. She did have some hirsutism.  NECK:  Revealed no jugular venous distention. There was no thyromegaly.  The trachea was midline, the carotids are 2+ and symmetric.  LUNGS:  Clear bilaterally to auscultation. There are no wheezes, rales  or rhonchi. No increased work of breathing.  CARDIOVASCULAR:  Revealed a regular rate and rhythm with normal S1 and  S2. I did not appreciate an S3 or S4 gallop. The PMI was not enlarged or  laterally displaced.  ABDOMEN:  Obese, nontender, nondistended. There was no organomegaly.  Bowel sounds present. There was no rebound or guarding.  EXTREMITIES:  No cyanosis, clubbing or edema. The pulses were 2+ and  symmetric.  SKIN:  Normal.  NEUROLOGIC:  Alert and oriented x3 with cranial nerves intact. The  strength was 5/5 and symmetric.   The EKG demonstrates sinus rhythm with a short PR interval and marked  ventricular preexcitation consistent with a left lateral accessory  pathway.   IMPRESSION:  1. Manifest Wolff-Parkinson-White syndrome.  2. History of supraventricular tachycardia.  3. Polycystic ovary syndrome.   DISCUSSION:  I discussed the treatment options with the patient with  regard to procedural options. I have recommended proceeding with  electrophysiologic study and catheter ablation to the patient, she is  considering her options and she will  call us if she decides to proceed  with ablation therapy. For now, she will continue on her p.r.n. beta  blockers.     Doylene Canning. Ladona Ridgel, MD  Electronically Signed    GWT/MedQ  DD: 11/24/2006  DT: 11/24/2006  Job #: [id number]   cc:   Madolyn Frieze. Jens Som, MD, [facility]  Seymour Bars, D.O.

## 2010-12-27 NOTE — Assessment & Plan Note (Signed)
[name]                            CARDIOLOGY OFFICE NOTE   Laura, [name]                       MRN:          [id number]  DATE:11/04/2006                            DOB:          1971-02-22    Laura [name] returns for followup today.  Please refer to my note of  September 02, 2006 for details.  We did schedule her to have an echo.  This showed normal LV function as well as mild left atrial enlargement.  She had an event monitor that showed essentially sinus rhythm, and sinus  tachycardia with occasional PACs.  Note, during the monitor she did not  have her sustained palpitations, but merely a skip.  She denies any  dyspnea or chest pain.   MEDICATIONS:  Include:  1. Multivitamin.  2. Propranolol 10 mg p.o. b.i.d. p.r.n.  3. Celexa.  4. Metformin 500 mg p.o. b.i.d.   PHYSICAL EXAMINATION:  Shows a blood pressure of 138/86.  Pulse is 95.  She weighs 276 pounds.  CHEST:  Clear.  CARDIOVASCULAR:  Exam shows a regular rate and rhythm.  EXTREMITIES:  Show no edema.   DIAGNOSES:  1. Wolff-Parkinson-White.  2. Polycystic ovarian disease.  3. History of mild hyperlipidemia.   PLAN:  Laura [name] returns for followup today.  She continues to have  occasional palpitations, but she did not have the sustained palpitations  that she has had in the past when she has described her elevated heart  rates in the 250 to 300 range.  Her previous electrocardiogram clearly  shows preexcitation.  I am concerned about the possibility of sudden  death, albeit small.  She has stated that she would consider ablation if  indicated.  I have asked her see Laura [name] for further evaluation and  management of this issue.  I will see her back in approximately 6  months.  In the meantime, we will arrange the appointment with Dr.  Graciela [name].     Laura [name] Laura Som, MD, [name]  Electronically Signed    BSC/MedQ  DD: 11/04/2006  DT: 11/04/2006  Job #: [id number]   cc:   Laura [name], D.O.

## 2011-06-05 ENCOUNTER — Telehealth: Payer: Self-pay | Admitting: Internal Medicine

## 2011-06-05 NOTE — Telephone Encounter (Signed)
Pt ready to schedule ablation, has had two episodes recently, said she saw the dr a few months ago, emt shows last ov 11-07-2009, does she need another appt first?

## 2011-06-06 NOTE — Telephone Encounter (Signed)
Will have her set up to come in and see Dr Ladona Ridgel

## 2011-07-16 ENCOUNTER — Encounter: Payer: Self-pay | Admitting: Internal Medicine

## 2011-07-18 ENCOUNTER — Ambulatory Visit (INDEPENDENT_AMBULATORY_CARE_PROVIDER_SITE_OTHER): Payer: Self-pay | Admitting: Internal Medicine

## 2011-07-18 ENCOUNTER — Encounter (HOSPITAL_COMMUNITY): Payer: Self-pay | Admitting: Pharmacy Technician

## 2011-07-18 ENCOUNTER — Encounter: Payer: Self-pay | Admitting: Internal Medicine

## 2011-07-18 VITALS — BP 122/84 | HR 60 | Ht 64.0 in | Wt 286.0 lb

## 2011-07-18 DIAGNOSIS — I498 Other specified cardiac arrhythmias: Secondary | ICD-10-CM

## 2011-07-18 DIAGNOSIS — I471 Supraventricular tachycardia: Secondary | ICD-10-CM

## 2011-07-18 DIAGNOSIS — I456 Pre-excitation syndrome: Secondary | ICD-10-CM

## 2011-07-18 LAB — CBC WITH DIFFERENTIAL/PLATELET
Basophils Absolute: 0 10*3/uL (ref 0.0–0.1)
Basophils Relative: 0.4 % (ref 0.0–3.0)
HCT: 42.3 % (ref 36.0–46.0)
Hemoglobin: 14.5 g/dL (ref 12.0–15.0)
Lymphs Abs: 2 10*3/uL (ref 0.7–4.0)
Monocytes Relative: 3.5 % (ref 3.0–12.0)
Neutro Abs: 5 10*3/uL (ref 1.4–7.7)
RBC: 4.47 Mil/uL (ref 3.87–5.11)
RDW: 13.4 % (ref 11.5–14.6)

## 2011-07-18 LAB — BASIC METABOLIC PANEL
CO2: 28 mEq/L (ref 19–32)
GFR: 86.35 mL/min (ref >60.00)
Glucose, Bld: 123 mg/dL — ABNORMAL HIGH (ref 70–99)
Potassium: 3.8 mEq/L (ref 3.5–5.1)
Sodium: 140 mEq/L (ref 135–145)

## 2011-07-18 NOTE — Patient Instructions (Signed)

## 2011-07-21 ENCOUNTER — Other Ambulatory Visit: Payer: Self-pay | Admitting: *Deleted

## 2011-07-21 DIAGNOSIS — I471 Supraventricular tachycardia: Secondary | ICD-10-CM

## 2011-07-22 MED ORDER — SODIUM CHLORIDE 0.9 % IV SOLN: INTRAVENOUS | Status: DC

## 2011-07-22 NOTE — Progress Notes (Signed)
HPI Mr. Turnbaugh is referred today for evaluation of SVT. The patient has a long-standing history of tachycardia palpitations and documented SVT at rates of over 180 beats per minute. These episodes start and stop suddenly. They're associated with palpitations. She has had no frank syncope. Medical therapy has not been particularly successful in preventing her symptoms. She is now referred for consideration for catheter ablation. Allergies  Allergen Reactions  . Neomycin   . Penicillins      Current Outpatient Prescriptions  Medication Sig Dispense Refill  . ibuprofen (ADVIL,MOTRIN) 400 MG tablet Take 400 mg by mouth every 6 (six) hours as needed. For pain/fever         Past Medical History  Diagnosis Date  . 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 Polycystic ovary disease   . Allergic rhinitis   . Dysmenorrhea   . Dyslipidemia   . Obesity   . Gall bladder disease   . Infertility, female     ROS:   All systems reviewed and negative except as noted in the HPI.   Past Surgical History  Procedure Date  . Laparoscopic cholecystectomy   . Transthoracic echcardiogram 09/15/06     Family History  Problem Relation Age of Onset  . Thyroid disease Brother   . Thyroid disease Sister   . Hypertension Father   . Colon cancer Mother 43  . Breast cancer Mother   . Diabetes Mother   . Heart attack Mother     AMI, in late 24's  . Depression Mother   . Heart disease Mother      History   Social History  . Marital Status: Married    Spouse Name: N/A    Number of Children: 5  . Years of Education: N/A   Occupational History  . 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 Smoking status: Former Smoker    Quit date: 07/15/2002  . Smokeless tobacco: Not on file  . Alcohol Use: Not on file  . Drug Use: Not on file  . Sexually Active: Not on file   Other Topics Concern  . Not on file   Social History Narrative  . No narrative on file      BP 122/84  Pulse 60  Ht 5\' 4"  (1.626 m)  Wt 129.729 kg (286 lb)  BMI 49.09 kg/m2  Physical Exam:  Obese, Well appearing NAD HEENT: Unremarkable Neck:  No JVD, no thyromegally Lymphatics:  No adenopathy Back:  No CVA tenderness Lungs:  Clear with no wheezes, rales, or rhonchi. HEART:  Regular rate rhythm, no murmurs, no rubs, no clicks Abd:  Abuse, soft, positive bowel sounds, no organomegally, no rebound, no guarding Ext:  2 plus pulses, no edema, no cyanosis, no clubbing Skin:  No rashes no nodules Neuro:  CN II through XII intact, motor grossly intact  EKG Normal sinus rhythm with minimal ventricular preexcitation.  Assess/Plan:

## 2011-07-22 NOTE — Assessment & Plan Note (Signed)
The patient has evidence of ventricular preexcitation by 12-lead EKG. She has documented SVT. I discussed the treatment options with the patient. The risks, goals, benefits, and expectations of catheter ablation have been discused with the patient and she wishes to proceed.

## 2011-07-23 ENCOUNTER — Ambulatory Visit (HOSPITAL_COMMUNITY)
Admission: RE | Admit: 2011-07-23 | Discharge: 2011-07-24 | Disposition: A | Discharge status: Home / Self Care | Payer: Managed Care, Other (non HMO) | Source: Ambulatory Visit | Attending: Internal Medicine | Admitting: Internal Medicine

## 2011-07-23 ENCOUNTER — Encounter (HOSPITAL_COMMUNITY)
Admission: RE | Disposition: A | Discharge status: Home / Self Care | Payer: Self-pay | Source: Ambulatory Visit | Attending: Internal Medicine

## 2011-07-23 DIAGNOSIS — I498 Other specified cardiac arrhythmias: Secondary | ICD-10-CM | POA: Insufficient documentation

## 2011-07-23 DIAGNOSIS — I471 Supraventricular tachycardia: Secondary | ICD-10-CM

## 2011-07-23 DIAGNOSIS — E669 Obesity, unspecified: Secondary | ICD-10-CM | POA: Insufficient documentation

## 2011-07-23 DIAGNOSIS — I456 Pre-excitation syndrome: Secondary | ICD-10-CM | POA: Insufficient documentation

## 2011-07-23 HISTORY — PX: SUPRAVENTRICULAR TACHYCARDIA ABLATION: SHX5492

## 2011-07-23 LAB — POCT ACTIVATED CLOTTING TIME: Activated Clotting Time: 199 seconds

## 2011-07-23 LAB — APTT: aPTT: 31 seconds (ref 24–37)

## 2011-07-23 SURGERY — SUPRAVENTRICULAR TACHYCARDIA ABLATION
Anesthesia: LOCAL

## 2011-07-23 MED ORDER — FENTANYL CITRATE 0.05 MG/ML IJ SOLN
INTRAMUSCULAR | Status: AC
  Filled 2011-07-23: qty 2

## 2011-07-23 MED ORDER — FENTANYL CITRATE 0.05 MG/ML IJ SOLN: 25.0000 ug | INTRAMUSCULAR | Status: DC | PRN

## 2011-07-23 MED ORDER — ONDANSETRON HCL 4 MG/2ML IJ SOLN: 4.0000 mg | Freq: Four times a day (QID) | INTRAMUSCULAR | Status: DC | PRN

## 2011-07-23 MED ORDER — MIDAZOLAM HCL 5 MG/5ML IJ SOLN
INTRAMUSCULAR | Status: AC
  Filled 2011-07-23: qty 5

## 2011-07-23 MED ORDER — ACETAMINOPHEN 325 MG PO TABS: 650.0000 mg | ORAL_TABLET | ORAL | Status: DC | PRN

## 2011-07-23 MED ORDER — BUPIVACAINE HCL (PF) 0.25 % IJ SOLN
INTRAMUSCULAR | Status: AC
  Filled 2011-07-23: qty 30

## 2011-07-23 MED ORDER — IBUPROFEN 400 MG PO TABS
400.0000 mg | ORAL_TABLET | Freq: Four times a day (QID) | ORAL | Status: DC | PRN
  Filled 2011-07-23: qty 1

## 2011-07-23 MED ORDER — ATROPINE SULFATE 1 MG/ML IJ SOLN
INTRAMUSCULAR | Status: AC
  Filled 2011-07-23: qty 1

## 2011-07-23 MED ORDER — SODIUM CHLORIDE 0.9 % IV SOLN: 250.0000 mL | INTRAVENOUS | Status: AC

## 2011-07-23 NOTE — Op Note (Signed)
EPS/RFA of manifest WPW syndrome with inducible AVRT without immediate complications. [id number].

## 2011-07-23 NOTE — Op Note (Signed)
NAMELEVORA, WERDEN NO.:  [id number]  MEDICAL RECORD NO.:  [id number]  LOCATION:  4703                         FACILITY:  [facility]  PHYSICIAN:  Doylene Canning. 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  INTRODUCTION:  The patient is a 40 year old woman with a longstanding history of tachy palpitations.  She had documented SVT at rates of over 200 beats per minute.  At baseline EKG, she has pre-excitation consistent with a left lateral accessory pathway, and she is now referred for catheter ablation.  PROCEDURE:  After informed consent was obtained, the patient was taken to the diagnostic EP lab in a fasting state.  After usual preparation and draping, intravenous fentanyl and midazolam was given for sedation. A 6-French hexapolar catheter was inserted percutaneously into the right jugular vein and advanced to the coronary sinus.  A 6-French quadripolar catheter was inserted percutaneously in the right femoral vein and advanced to the right ventricle.  A 6-French quadripolar catheter was inserted percutaneously in the right femoral vein and advanced to the His bundle region.  After measurement of the basic intervals, rapid ventricular pacing was carried out from the right ventricle demonstrating VA Wenckebach at 300 msec.  During rapid ventricular pacing, the atrial activation sequence was eccentric and initially nondecremental.  During rapid ventricular pacing, there was inducible SVT.  Next, programmed ventricular stimulation was carried out from the right ventricle at a base drive cycle length of 161 msec.  The S1-S2 interval was stepwise decreased from 440 msec down to 250 msec where ventricular refractoriness was  observed.  During programmed ventricular stimulation, the atrial activation sequence was eccentric and initially nondecremental.  Next, a programmed atrial stimulation was carried out from the coronary sinus at a base drive cycle length of 096 msec.  The S1-S2 interval was stepwise decreased from 440 msec down to atrial refractoriness.  During programmed atrial stimulation, there was inducible SVT.  It should be noted that during induction of SVT, there was block in the accessory pathway  antegrade and conduction through the AV node resulting in the initiation of tachycardia.  Next, rapid atrial pacing was carried out from the coronary sinus at a base drive cycle length of 045 msec and stepwise decreased down to 310 msec resulting in the initiation of SVT.  During rapid atrial pacing, there was maximal pre-excitation until antegrade block in the pathway was demonstrated resulting in the initiation of SVT.  At this point, SVT could be reproducibly induced with rapid atrial pacing and programmed atrial stimulation.  Because the initial coronary sinus catheter could not be advanced far enough out into the coronary sinus due to a valve, a decapolar coronary sinus catheter was advanced by way of the right femoral vein into the coronary sinus.  This allowed for blackening of the patient's SVT to be demonstrated.  Mapping of the patient's accessory pathway demonstrated a far left lateral accessory pathway at a location approximately 3  o'clock on the mitral valve anulus when viewed in the LAO projection.  At this point, the right femoral artery was punctured, and a 7-French quadripolar ablation catheter was advanced into the left ventricle.  Prior to this, heparin 6000 units was administered intravenously.  Mapping was then carried out of the accessory pathway retrograde across the aortic valve along the mitral valve anulus.  Both the atrial and ventricular insertion of the accessory pathway were  mapped.  Again, this demonstrated that the accessory pathway conduction was present at a location between 3 and 4 o'clock on the mitral valve anulus.  A total of 4 RF energy applications including a bonus RF energy applications were subsequently delivered to the accessory pathway.  Both atrial and ventricular insertions of the pathway were targeted.  During RF energy application,  accessory pathway conduction terminated and following this, the patient was observed for over 30 minutes.  Rapid ventricular pacing then demonstrated VA dissociation, and rapid atrial pacing demonstrated no evidence of any residual pre-excitation, and no evidence of any inducible arrhythmias. The catheters were then removed, hemostasis was assured, and the patient was returned to her room in satisfactory condition.  COMPLICATIONS:  There were no immediate procedure complications.  RESULTS:  A.  Baseline ECG.  The baseline ECG demonstrates sinus rhythm with ventricular pre-excitation. B.  Baseline intervals.  Sinus node cycle length was 813 msec, QRS duration initially was 160 msec.  Following ablation was 87 msec.  The HV interval was negative at 8 msec prior to ablation, and 34 msec after ablation.  The AH interval was 115 msec. C.  Rapid ventricular pacing.  Rapid ventricular pacing prior to ablation demonstrated eccentric atrial activation which was initially nondecremental.  Following ablation, there was VA dissociation at 600 msec. D.  Programmed ventricular stimulation.  Programmed ventricular stimulation was carried out from the right ventricle at a base drive cycle length of 956 msec.  The S1-S2 interval was stepwise decreased from 440 msec down to 250 msec where ventricular refractoriness was observed.  During programmed ventricular stimulation prior to ablation, the atrial activation was eccentric and initially nondecremental. Following ablation, there was VA dissociation. E.  Rapid atrial pacing.   Rapid atrial pacing was carried out from the coronary sinus at a pacing cycle length of 590 msec, stepwise decreased down to 300 msec resulting in an AV Wenckebach.  During rapid atrial pacing, the PR interval was never greater than the RR interval.  During rapid atrial pacing prior to ablation, there was maximal pre-excitation demonstrated.  Following ablation, there was no evidence of pre- excitation.  Following ablation, the AV conduction was decremental. F.  Programmed atrial stimulation.  Programmed atrial stimulation was carried out from the coronary sinus at a base drive cycle length of 213 msec.  The S1-S2 interval was stepwise decreased down to 270 msec resulting in the initiation of SVT.  Following ablation, programmed atrial stimulation demonstrated no evidence of any inducible SVT and no evidence of any residual accessory pathway conduction. G.  Arrhythmias observed. 1. AV reentrant tachycardia initiation was with programmed atrial and     ventricular stimulation as well as with rapid atrial pacing, the     duration was sustained, termination was spontaneous.     a.     Mapping.  Mapping of the accessory pathway demonstrated the      pathway to be located at a location approximately 3 o'clock in the      LAO projection on the mitral valve  anulus.     b.     RF energy application.  A total of 4 RF energy applications      were delivered including 1 bonus RF energy application resulting      in termination of accessory pathway conduction, and rendering the      tachycardia noninducible.  CONCLUSION:  This study demonstrates successful electrophysiologic study and RF catheter ablation of a manifest left lateral accessory pathway resulting in easily inducible SVT.  Following catheter ablation, both on the atrial and ventricular insertions of the pathway, there was no inducible SVT and no evidence of any residual accessory pathway conduction.     Doylene Canning. Ladona Ridgel,  MD    GWT/MEDQ  D:  07/23/2011  T:  07/23/2011  Job:  [id number]

## 2011-07-23 NOTE — Progress Notes (Signed)
Addended by: Erin Hearing on: 07/23/2011 03:15 PM   Modules accepted: Orders

## 2011-07-23 NOTE — Interval H&P Note (Signed)
History and Physical Interval Note:  07/23/2011 8:40 AM  Laura [name]  has presented today for surgery, with the diagnosis of svt  The various methods of treatment have been discussed with the patient and family. After consideration of risks, benefits and other options for treatment, the patient has consented to  Procedure(s): SUPRAVENTRICULAR TACHYCARDIA ABLATION as a surgical intervention .  The patients' history has been reviewed, patient examined, no change in status, stable for surgery.  I have reviewed the patients' chart and labs.  Questions were answered to the patient's satisfaction.     Lewayne Bunting

## 2011-07-23 NOTE — Plan of Care (Signed)
Problem: Consults Goal: EP Study Patient Education (See Patient Education module for education specifics.) Outcome: Completed/Met Date Met:  07/23/11 Pt had ablation of SVT 07/23/11 considered successful per progress note and what pt told me MD. Laura [name] her

## 2011-07-23 NOTE — H&P (View-Only) (Signed)
HPI [name] is referred today for evaluation of SVT. The patient has a long-standing history of tachycardia palpitations and documented SVT at rates of over 180 beats per minute. These episodes start and stop suddenly. They're associated with palpitations. She has had no frank syncope. Medical therapy has not been particularly successful in preventing her symptoms. She is now referred for consideration for catheter ablation. Allergies  Allergen Reactions  . Neomycin   . Penicillins      Current Outpatient Prescriptions  Medication Sig Dispense Refill  . ibuprofen (ADVIL,MOTRIN) 400 MG tablet Take 400 mg by mouth every 6 (six) hours as needed. For pain/fever         Past Medical History  Diagnosis Date  . 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 Polycystic ovary disease   . Allergic rhinitis   . Dysmenorrhea   . Dyslipidemia   . Obesity   . Gall bladder disease   . Infertility, female     ROS:   All systems reviewed and negative except as noted in the HPI.   Past Surgical History  Procedure Date  . Laparoscopic cholecystectomy   . Transthoracic echcardiogram 09/15/06     Family History  Problem Relation Age of Onset  . Thyroid disease Brother   . Thyroid disease Sister   . Hypertension Father   . Colon cancer Mother 57  . Breast cancer Mother   . Diabetes Mother   . Heart attack Mother     AMI, in late 50's  . Depression Mother   . Heart disease Mother      History   Social History  . Marital Status: Married    Spouse Name: N/A    Number of Children: 5  . Years of Education: N/A   Occupational History  . 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 Smoking status: Former Smoker    Quit date: 07/15/2002  . Smokeless tobacco: Not on file  . Alcohol Use: Not on file  . Drug Use: Not on file  . Sexually Active: Not on file   Other Topics Concern  . Not on file   Social History Narrative  . No narrative on file      BP 122/84  Pulse 60  Ht 5' 4" (1.626 m)  Wt 129.729 kg (286 lb)  BMI 49.09 kg/m2  Physical Exam:  Obese, Well appearing NAD HEENT: Unremarkable Neck:  No JVD, no thyromegally Lymphatics:  No adenopathy Back:  No CVA tenderness Lungs:  Clear with no wheezes, rales, or rhonchi. HEART:  Regular rate rhythm, no murmurs, no rubs, no clicks Abd:  Abuse, soft, positive bowel sounds, no organomegally, no rebound, no guarding Ext:  2 plus pulses, no edema, no cyanosis, no clubbing Skin:  No rashes no nodules Neuro:  CN II through XII intact, motor grossly intact  EKG Normal sinus rhythm with minimal ventricular preexcitation.  Assess/Plan:    

## 2011-07-24 ENCOUNTER — Other Ambulatory Visit: Payer: Self-pay

## 2011-07-24 NOTE — Discharge Summary (Signed)
ELECTROPHYSIOLOGY PROCEDURE DISCHARGE SUMMARY    Patient ID: Laura [name],  MRN: [id number], DOB/AGE: [birth year] 41 y.o.  Admit date: 07/23/2011 Discharge date: 07/24/2011  Primary Care Physician: Laura [name] Primary Electrophysiologist:  Laura [name]  Primary Discharge Diagnosis:  Wolfe-Parkinson-White syndrome status post ablation this admission  Secondary Discharge Diagnosis:  . 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 Polycystic ovary disease   . Allergic rhinitis   . Dysmenorrhea   . Dyslipidemia   . Obesity   . Gall bladder disease   . Infertility, female     Procedures This Admission:  1.  Electrophysiology study and radiofrequency catheter ablation of supraventricular tachycardia on 07-23-11 by Dr Laura [name].  This demonstrated successful ablation of a manifest left lateral accessory pathway resulting in easily inducible SVT.  Following catheter ablation, both on the atrial and ventricular insertions of the pathway, there was no inducible SVT and no evidence of any residual accessory pathway conduction.   Brief HPI:  Laura [name] is a 40 year old female with a longstanding history of tachypalpitations.  She had documented SVT at rates of over 200 beats per minute.  At baseline EKG, she has pre-excitation consistent with a left lateral accessory pathway and was referred for catheter ablation.  Risks, benefits, and alternatives to ablation were reviewed with the patient and she wished to proceed.  Hospital Course: The patient was admitted on 07-22-11 for planned ablation of SVT.  This was carried out by Dr Laura [name] with details as outlined above.  She was monitored on telemetry overnight which demonstrated sinus rhythm.  Her groin and neck incisions were without hematoma or bruit.  EKG was obtained which demonstrated sinus rhythm with intervals of .15/.08/.46.  Pre-excitation that was noted on previous EKG's is no longer present.  Dr Laura [name] considered the patient stable for  discharge to home.  She was advised not to drive for 2 days and to avoid strenuous activity for 1 week.  She will follow up with Dr Laura [name] in the office in 8 weeks.  The patient verbalized understanding of discharge instructions.   Discharge Vitals: Blood pressure 146/86, pulse 80, temperature 98.5 F (36.9 C), temperature source Oral, resp. rate 16, height 5\' 4"  (1.626 m), weight 279 lb 6.4 oz (126.735 kg), last menstrual period 07/03/2011, SpO2 98.00%.    Labs:   Lab Results  Component Value Date   WBC 7.4 07/18/2011   HGB 14.5 07/18/2011   HCT 42.3 07/18/2011   MCV 94.8 07/18/2011   PLT 246.0 07/18/2011    Lab 07/18/11 1203  NA 140  K 3.8  CL 106  CO2 28  BUN 8  CREATININE 0.8  CALCIUM 8.6  PROT --  BILITOT --  ALKPHOS --  ALT --  AST --  GLUCOSE 123*    Discharge Medications: Current Discharge Medication List    CONTINUE these medications which have NOT CHANGED   Details  ibuprofen (ADVIL,MOTRIN) 400 MG tablet Take 400 mg by mouth every 6 (six) hours as needed. For pain/fever        Disposition:  Discharge Orders    Future Appointments: Provider: Department: Dept Phone: Center:   09/12/2011 12:00 PM Laura Bunting, MD [facility] [phone] [facility]     Future Orders Please Complete By Expires   Diet - low sodium heart healthy      Increase activity slowly      Scheduling Instructions:   No driving for 1 day. No lifting over 5  lbs for 3 days.     Discharge wound care:      Comments:   Keep procedure site clean & dry. If you notice increased pain, swelling, bleeding or pus, call/return!  You may shower, but no soaking baths/hot tubs/pools for 1 week.       Follow-up Information    Follow up with Laura Bunting, MD. (09/12/11 at 12:00pm)    Contact information:   [address] [place] [address] [phone]          Duration of Discharge Encounter: Greater than 30 minutes including physician  time.  Signed, Laura Balsam, RN, BSN 07/24/2011, 8:46 AM

## 2011-07-24 NOTE — Progress Notes (Signed)
   ELECTROPHYSIOLOGY ROUNDING NOTE    Patient Name: Laura [name] Date of Encounter: 07-24-2011    SUBJECTIVE:Patient feels well.  Status post SVT ablation 07-23-11 by Dr Ladona Ridgel.  No chest pain or shortness of breath.  Groin and neck incisions feel okay.  TELEMETRY: Reviewed telemetry pt in sinus rhythm.  EKG:  Sinus rhythm with intervals of .15/.08/.46.  Pre-excitation that was noted on previous EKG's is no longer present.   Filed Vitals:   07/23/11 1730 07/23/11 1900 07/23/11 2033 07/24/11 0300  BP: 108/64  126/75 113/74  Pulse: 74  73 81  Temp:   97.4 F (36.3 C) 98.5 F (36.9 C)  TempSrc:   Oral Oral  Resp: 18  19 20   Height:      Weight:  280 lb 3.3 oz (127.1 kg)  279 lb 6.4 oz (126.735 kg)  SpO2: 95%  96% 97%    Intake/Output Summary (Last 24 hours) at 07/24/11 0744 Last data filed at 07/24/11 1610  Gross per 24 hour  Intake   1150 ml  Output   2375 ml  Net  -1225 ml   Exam - normal. Right groin and neck without hematoma.  Discharge education complete including wound care and restrictions.    A/P 1. WPW with SVT - s/p ablation. Will discharge with usual follow. No additional beta blocker at this point.

## 2011-07-24 NOTE — Plan of Care (Signed)
Problem: Phase I Progression Outcomes Goal: Vascular site scale level 0 - I Vascular Site Scale Level 0: No bruising/bleeding/hematoma Level I (Mild): Bruising/Ecchymosis, minimal bleeding/ooozing, palpable hematoma < 3 cm Level II (Moderate): Bleeding not affecting hemodynamic parameters, pseudoaneurysm, palpable hematoma > 3 cm Outcome: Completed/Met Date Met:  07/24/11 Right femoral/right jugular level 0

## 2011-07-25 ENCOUNTER — Telehealth: Payer: Self-pay | Admitting: Internal Medicine

## 2011-07-25 NOTE — Telephone Encounter (Signed)
New Msg: Pt calling stating that she having slight/faint bruising and wants to discuss it with Md/nurse. Please return pt call to discuss further.

## 2011-07-28 ENCOUNTER — Telehealth: Payer: Self-pay | Admitting: Internal Medicine

## 2011-07-28 NOTE — Telephone Encounter (Signed)
lmom for patient that this is normal and to return my call if needed

## 2011-07-28 NOTE — Telephone Encounter (Signed)
Pt rtn your call

## 2011-07-28 NOTE — Telephone Encounter (Signed)
Called patient again and we discussed the bruising in her leg and groining  She was up a lot on her feet a lot this weekend and this could be part of her problem.  She is going to keep an eye on things and if it does not continue to get better she will call me

## 2011-09-02 ENCOUNTER — Encounter: Payer: Self-pay | Admitting: Family Medicine

## 2011-09-02 ENCOUNTER — Ambulatory Visit (INDEPENDENT_AMBULATORY_CARE_PROVIDER_SITE_OTHER): Payer: Managed Care, Other (non HMO) | Admitting: Family Medicine

## 2011-09-02 VITALS — BP 109/71 | HR 67 | Temp 98.2°F | Wt 288.0 lb

## 2011-09-02 DIAGNOSIS — I456 Pre-excitation syndrome: Secondary | ICD-10-CM

## 2011-09-02 DIAGNOSIS — L738 Other specified follicular disorders: Secondary | ICD-10-CM

## 2011-09-02 DIAGNOSIS — E282 Polycystic ovarian syndrome: Secondary | ICD-10-CM

## 2011-09-02 MED ORDER — METFORMIN HCL 500 MG PO TABS: 500.0000 mg | ORAL_TABLET | Freq: Two times a day (BID) | ORAL | Status: DC

## 2011-09-02 MED ORDER — TRETINOIN MICROSPHERE 0.1 % EX GEL: Freq: Every day | CUTANEOUS | Status: DC

## 2011-09-02 NOTE — Progress Notes (Signed)
  Subjective:    Patient ID: Laura [name], female    DOB: 09-Sep-1970, 41 y.o.   MRN: [id number]  HPI Rash  On face for several months. Seems to be getting worse.  She said she went to her dermatologist every year for very similar lesions but only had a few at the time. She said a couple were present and then one was biopsied. She was given a Retin-A cream. She said she ran out of it a long time ago. She is more concerned because she's had multiple lesions pop up very briskly over the last month. She says typically they're very red and irritated when they start initially until sober period but then they become more flesh-colored. No active drainage.  Would like to restat hre metformin for her PCOS.   I also updated her problem is. She has had ablation therapy for her Wolff-Parkinson-White. Fortunately this went well. She is excited because she is now back in the gym working out.  Review of Systems     Objective:   Physical Exam  She does have some flesh-colored papules diffusely on her face. They appear to be enlarged sebaceous glands. A few of them appear to have a coarse liver but most of them do not. She does have a larger lesion on her right cheek which has more of a warty-like appearance.      Assessment & Plan:  Sebaceous Hyperplasia - we'll restart her topical Retin-A cream and follow up in 6 weeks to see if her symptoms are improving. Can also consider perform electrocautery to treat areas as well.  I reminded her that she is due for her Pap smear. She said she will try to schedule her GYN exam.  PCOS.-she would like to restart her metformin. I sent  a new prescription for her. She is back in the gym and working out now she has had her ablation for her Wolff-Parkinson-White syndrome.

## 2011-09-12 ENCOUNTER — Ambulatory Visit (INDEPENDENT_AMBULATORY_CARE_PROVIDER_SITE_OTHER): Payer: Managed Care, Other (non HMO) | Admitting: Internal Medicine

## 2011-09-12 ENCOUNTER — Encounter: Payer: Self-pay | Admitting: Internal Medicine

## 2011-09-12 VITALS — BP 112/78 | HR 65 | Wt 287.8 lb

## 2011-09-12 DIAGNOSIS — I456 Pre-excitation syndrome: Secondary | ICD-10-CM

## 2011-09-12 DIAGNOSIS — I471 Supraventricular tachycardia: Secondary | ICD-10-CM

## 2011-09-12 NOTE — Patient Instructions (Signed)
Your physician recommends that you schedule a follow-up appointment as needed  

## 2011-09-12 NOTE — Assessment & Plan Note (Signed)
The patient has no evidence of any recurrent WPW. ECG demonstrates no preexcitation.

## 2011-09-12 NOTE — Progress Notes (Signed)
HPI [name] returns today for followup. She is a pleasant 41 year old woman with recurrent SVT and documented WPW syndrome. She underwent electrophysiologic study and catheter ablation of a left lateral accessory pathway several weeks ago. She has done well since. She denies recurrent tachypalpitations or SVT. No chest pain or shortness of breath. Allergies  Allergen Reactions  . Neomycin   . Penicillins      Current Outpatient Prescriptions  Medication Sig Dispense Refill  . ibuprofen (ADVIL,MOTRIN) 400 MG tablet Take 400 mg by mouth every 6 (six) hours as needed. For pain/fever      . metFORMIN (GLUCOPHAGE) 500 MG tablet Take 1 tablet (500 mg total) by mouth 2 (two) times daily with a meal.  60 tablet  3     Past Medical History  Diagnosis Date  . 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 Polycystic ovary disease   . Allergic rhinitis   . Dysmenorrhea   . Dyslipidemia   . Obesity   . Gall bladder disease   . Infertility, female     ROS:   All systems reviewed and negative except as noted in the HPI.   Past Surgical History  Procedure Date  . Laparoscopic cholecystectomy   . Transthoracic echcardiogram 09/15/06     Family History  Problem Relation Age of Onset  . Thyroid disease Brother   . Thyroid disease Sister   . Hypertension Father   . Colon cancer Mother 43  . Breast cancer Mother   . Diabetes Mother   . Heart attack Mother     AMI, in late 70's  . Depression Mother   . Heart disease Mother      History   Social History  . Marital Status: Married    Spouse Name: N/A    Number of Children: 5  . Years of Education: N/A   Occupational History  . 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 Smoking status: Former Smoker    Quit date: 07/15/2002  . Smokeless tobacco: Not on file  . Alcohol Use: Not on file  . Drug Use: Not on file  . Sexually Active: Not on file   Other Topics Concern  . Not on file   Social  History Narrative  . No narrative on file     BP 112/78  Pulse 65  Wt 130.545 kg (287 lb 12.8 oz)  Physical Exam:  Well appearing obese, middle-aged woman, NAD HEENT: Unremarkable Neck:  No JVD, no thyromegally Lymphatics:  No adenopathy Back:  No CVA tenderness Lungs:  Clear with no wheezes, rales, or rhonchi. HEART:  Regular rate rhythm, no murmurs, no rubs, no clicks Abd:  Obese, soft, positive bowel sounds, no organomegally, no rebound, no guarding Ext:  2 plus pulses, no edema, no cyanosis, no clubbing Skin:  No rashes no nodules Neuro:  CN II through XII intact, motor grossly intact  EKG Normal sinus rhythm with no ventricular preexcitation.  Assess/Plan:

## 2011-10-24 ENCOUNTER — Encounter: Payer: Self-pay | Admitting: Family Medicine

## 2011-10-24 ENCOUNTER — Ambulatory Visit (INDEPENDENT_AMBULATORY_CARE_PROVIDER_SITE_OTHER): Payer: Managed Care, Other (non HMO) | Admitting: Family Medicine

## 2011-10-24 VITALS — BP 107/71 | HR 70 | Ht 64.0 in | Wt 288.0 lb

## 2011-10-24 DIAGNOSIS — Z803 Family history of malignant neoplasm of breast: Secondary | ICD-10-CM

## 2011-10-24 DIAGNOSIS — N644 Mastodynia: Secondary | ICD-10-CM

## 2011-10-24 DIAGNOSIS — L738 Other specified follicular disorders: Secondary | ICD-10-CM

## 2011-10-24 NOTE — Progress Notes (Signed)
  Subjective:    Patient ID: Laura [name], female    DOB: [birth year], 41 y.o.   MRN: [id number]  HPI  Left breast pain started about a month ago but then went away.  Says started again 2 weeks ago. No lumps.  Mother with hx of BrCa and she is fearful of this.  Mom dx in her last 60s. Last mammo was 37 for baseline.  No change in breast size per se but has gotten bigger but has put on weight.  Mom also had colon cancer at age 19.  Last colonoscopy was age 14.  No drianage from the nipple.  Not taking any OTC meds. Feels like a stabbing and pinching pain.   Review of Systems     Objective:   Physical Exam  Pulmonary/Chest: Chest wall is not dull to percussion. She exhibits no mass, no tenderness, no bony tenderness, no laceration, no crepitus, no edema, no deformity, no swelling and no retraction. Right breast exhibits no inverted nipple, no mass, no nipple discharge, no skin change and no tenderness. Left breast exhibits no inverted nipple, no mass, no nipple discharge, no skin change and no tenderness. Breasts are symmetrical.   Right breast is larger than left.        Assessment & Plan:  Left breast pain - no lesions on exam.  Discussed will refer for diagnostic mammo. Prefers WS for further evaluation.  Will schedule.  Call if any concners. Alsow make sure wearing good fitting bra and avoid caffeine.

## 2011-10-24 NOTE — Patient Instructions (Signed)
We will call with the referral. 

## 2011-11-05 ENCOUNTER — Encounter: Payer: Self-pay | Admitting: *Deleted

## 2012-01-28 DIAGNOSIS — R3129 Other microscopic hematuria: Secondary | ICD-10-CM | POA: Insufficient documentation

## 2012-01-28 DIAGNOSIS — Z87442 Personal history of urinary calculi: Secondary | ICD-10-CM | POA: Insufficient documentation

## 2012-01-28 DIAGNOSIS — R109 Unspecified abdominal pain: Secondary | ICD-10-CM | POA: Insufficient documentation

## 2012-05-07 ENCOUNTER — Encounter: Payer: Self-pay | Admitting: Physician Assistant

## 2012-05-07 ENCOUNTER — Ambulatory Visit (INDEPENDENT_AMBULATORY_CARE_PROVIDER_SITE_OTHER): Payer: Managed Care, Other (non HMO) | Admitting: Physician Assistant

## 2012-05-07 VITALS — BP 141/74 | HR 80 | Temp 97.6°F | Ht 64.0 in | Wt 299.0 lb

## 2012-05-07 DIAGNOSIS — H6090 Unspecified otitis externa, unspecified ear: Secondary | ICD-10-CM

## 2012-05-07 DIAGNOSIS — R609 Edema, unspecified: Secondary | ICD-10-CM

## 2012-05-07 DIAGNOSIS — R030 Elevated blood-pressure reading, without diagnosis of hypertension: Secondary | ICD-10-CM

## 2012-05-07 DIAGNOSIS — H60399 Other infective otitis externa, unspecified ear: Secondary | ICD-10-CM

## 2012-05-07 DIAGNOSIS — J019 Acute sinusitis, unspecified: Secondary | ICD-10-CM

## 2012-05-07 DIAGNOSIS — R600 Localized edema: Secondary | ICD-10-CM

## 2012-05-07 DIAGNOSIS — IMO0001 Reserved for inherently not codable concepts without codable children: Secondary | ICD-10-CM

## 2012-05-07 MED ORDER — AZITHROMYCIN 250 MG PO TABS: ORAL_TABLET | ORAL | Status: DC

## 2012-05-07 MED ORDER — CIPROFLOXACIN-DEXAMETHASONE 0.3-0.1 % OT SUSP: 4.0000 [drp] | Freq: Two times a day (BID) | OTIC | Status: DC

## 2012-05-07 MED ORDER — HYDROCHLOROTHIAZIDE 12.5 MG PO CAPS: 12.5000 mg | ORAL_CAPSULE | Freq: Every day | ORAL | Status: DC

## 2012-05-07 NOTE — Progress Notes (Signed)
Subjective:    Patient ID: Laura [name], female    DOB: 1970-09-21, 41 y.o.   MRN: [id number]  HPI Patient is a 41 year old female who presents to the clinic with 2 weeks of sinus pressure and upper respiratory symptoms. Her fever is off and on and has been as high as 102.1. she seems to be able to control this with Tylenol and Motrin. She has had occasional shortness of breath mostly at night before bed. She does not have a history of asthma nor does she have a rescue inhaler. She does have some left ear pain and drainage. About one month ago she did have otitis externa and needed eardrops. She does have a history of eczema in her ears. She denies any GI symptoms or nausea vomiting. She's had a lot of sinus pressure her maxillary area. She has tried Mucinex D., decongestant, Tylenol and Motrin. She continues to get worse. She denies any sore throat, chest pain, palpitations. Her blood pressure is slightly elevated today which has not usual for her. She has been taking a lot of decongestants. She's also having intermittent swelling of both of her ankles. She does work third shift and it is worse after she gets off. She has not tried anything to make better. Resting does make her own home. They're not as bad today as they are sometimes. 102.1. No N/V.sinus p. SOB   Review of Systems     Objective:   Physical Exam  Constitutional: She appears well-developed and well-nourished.  HENT:  Head: Normocephalic and atraumatic.  Right Ear: External ear normal.  Nose: Nose normal.  Mouth/Throat: Oropharynx is clear and moist. No oropharyngeal exudate.       TMs normal bilaterally. Right external canal normal. Left external canal presents with erythema and yellowish white drainage with clumps of yellow throughout canal.  Tenderness to palpation of the maxillary sinuses.  Eyes: Conjunctivae normal are normal.  Neck: Normal range of motion. Neck supple.       No tenderness but enlargement of anterior  cervical lymph nodes bilaterally.  Cardiovascular: Normal rate, regular rhythm and normal heart sounds.   Pulmonary/Chest: Effort normal and breath sounds normal. She has no wheezes.  Lymphadenopathy:    She has cervical adenopathy.  Skin: Skin is dry.       1+ pitting edema bilateral legs slightly past ankle. No redness or warmth coming from legs. No skin decomposition. Pedal pulses were 2+ bilaterally.  Psychiatric: She has a normal mood and affect. Her behavior is normal.          Assessment & Plan:   sinusitis/elevated blood pressure-patient is allergic to penicillin therefore we will give Zpak. I suspect blood pressure elevations do to decongestant use. I recommend stopping decongestants. Stay hydrated continue to use Motrin and Tylenol for fever and/or headaches/sinus pressure. Can use Mucinex without D. Consider using a Pott to help clean out the sinuses. Followup if not improving on Monday. May need steroids if not improving.  Left otalgia/external otitis-will treat with Cipro to for 7 days. This should help with history of eczema in the ear and infection.  Bilateral lower leg edema-encouraged patient to get compression stockings and wear on her third shift job. Discussed importance of the elevation once off work.. Discussed importance of low salt diet. Did give low dose HCTZ to take as needed for edema. Followup if swelling worsening or any other changes. 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

## 2012-05-07 NOTE — Patient Instructions (Addendum)
Start Zpak and Ciprodex. Call if not improving Monday.   Use HCTZ for lower leg swelling. Compression stocking and elevation at night.  Sinusitis Sinuses are air pockets within the bones of your face. The growth of bacteria within a sinus leads to infection. The infection prevents the sinuses from draining. This infection is called sinusitis. SYMPTOMS  There will be different areas of pain depending on which sinuses have become infected.  The maxillary sinuses often produce pain beneath the eyes.   Frontal sinusitis may cause pain in the middle of the forehead and above the eyes.  Other problems (symptoms) include:  Toothaches.   Colored, pus-like (purulent) drainage from the nose.   Swelling, warmth, and tenderness over the sinus areas may be signs of infection.  TREATMENT  Sinusitis is most often determined by an exam.X-rays may be taken. If x-rays have been taken, make sure you obtain your results or find out how you are to obtain them. Your caregiver may give you medications (antibiotics). These are medications that will help kill the bacteria causing the infection. You may also be given a medication (decongestant) that helps to reduce sinus swelling.  HOME CARE INSTRUCTIONS   Only take over-the-counter or prescription medicines for pain, discomfort, or fever as directed by your caregiver.   Drink extra fluids. Fluids help thin the mucus so your sinuses can drain more easily.   Applying either moist heat or ice packs to the sinus areas may help relieve discomfort.   Use saline nasal sprays to help moisten your sinuses. The sprays can be found at your local drugstore.  SEEK IMMEDIATE MEDICAL CARE IF:  You have a fever.   You have increasing pain, severe headaches, or toothache.   You have nausea, vomiting, or drowsiness.   You develop unusual swelling around the face or trouble seeing.  MAKE SURE YOU:   Understand these instructions.   Will watch your condition.   Will  get help right away if you are not doing well or get worse.  Document Released: 07/28/2005 Document Revised: 07/17/2011 Document Reviewed: 02/24/2007 [facility] Patient Information 2012 [place], [place].

## 2012-05-18 ENCOUNTER — Ambulatory Visit (INDEPENDENT_AMBULATORY_CARE_PROVIDER_SITE_OTHER): Payer: Managed Care, Other (non HMO) | Admitting: Sports Medicine

## 2012-05-18 ENCOUNTER — Encounter: Payer: Self-pay | Admitting: Sports Medicine

## 2012-05-18 VITALS — BP 125/74 | HR 67 | Temp 97.4°F | Wt 303.0 lb

## 2012-05-18 DIAGNOSIS — N951 Menopausal and female climacteric states: Secondary | ICD-10-CM | POA: Insufficient documentation

## 2012-05-18 DIAGNOSIS — M7989 Other specified soft tissue disorders: Secondary | ICD-10-CM

## 2012-05-18 DIAGNOSIS — J029 Acute pharyngitis, unspecified: Secondary | ICD-10-CM

## 2012-05-18 LAB — CBC
HCT: 41.2 % (ref 36.0–46.0)
Hemoglobin: 13.9 g/dL (ref 12.0–15.0)
MCH: 31.2 pg (ref 26.0–34.0)
MCHC: 33.7 g/dL (ref 30.0–36.0)
MCV: 92.4 fL (ref 78.0–100.0)
Platelets: 244 K/uL (ref 150–400)
RBC: 4.46 MIL/uL (ref 3.87–5.11)
RDW: 12.6 % (ref 11.5–15.5)
WBC: 7.3 10*3/uL (ref 4.0–10.5)

## 2012-05-18 LAB — COMPREHENSIVE METABOLIC PANEL
Albumin: 3.5 g/dL (ref 3.5–5.2)
BUN: 10 mg/dL (ref 6–23)
Calcium: 8.7 mg/dL (ref 8.4–10.5)
Chloride: 105 mEq/L (ref 96–112)
Glucose, Bld: 134 mg/dL — ABNORMAL HIGH (ref 70–99)
Potassium: 4.1 mEq/L (ref 3.5–5.3)
Sodium: 138 mEq/L (ref 135–145)
Total Protein: 6.2 g/dL (ref 6.0–8.3)

## 2012-05-18 LAB — COMPREHENSIVE METABOLIC PANEL WITH GFR
ALT: 28 U/L (ref 0–35)
AST: 23 U/L (ref 0–37)
Alkaline Phosphatase: 71 U/L (ref 39–117)
CO2: 26 meq/L (ref 19–32)
Creat: 0.88 mg/dL (ref 0.50–1.10)
Total Bilirubin: 0.4 mg/dL (ref 0.3–1.2)

## 2012-05-18 LAB — TSH: TSH: 2.676 u[IU]/mL (ref 0.350–4.500)

## 2012-05-18 MED ORDER — FLUTICASONE PROPIONATE 50 MCG/ACT NA SUSP: NASAL | Status: DC

## 2012-05-18 MED ORDER — FLUOCINOLONE ACETONIDE 0.01 % OT OIL: 5.0000 [drp] | TOPICAL_OIL | Freq: Two times a day (BID) | OTIC | Status: DC | PRN

## 2012-05-18 NOTE — Progress Notes (Signed)
Subjective:    CC: Sore throat  HPI: Laura [name] is a very pleasant 41 year old female with a history of PCOS, as well as Wolff-Parkinson-White syndrome status post ablation. who comes in after 4 weeks of symptoms. After 2 weeks of sore throat, and facial pressure, as well as left ear pain she was seen and treated for acute sinusitis, as well as an otitis externa with azithromycin, and Ciprodex. All her symptoms resolved, however until recently she started to have more fullness in her throat as well as runny nose. She also has significant itching in her left ear. Unfortunately, over the past month she's also noted significant swelling of both lower extremities, symmetric, this has never happened before.  She denies any recurrent fevers, chills, night sweats, weight loss, chest pain, or shortness of breath.  Past medical history, Surgical history, Family history, Social history, Allergies, and medications have been entered into the medical record, reviewed, and no changes needed.   Review of Systems: No fevers, chills, night sweats, weight loss, chest pain, or shortness of breath.   Objective:    General: Well Developed, well nourished, and in no acute distress.  Neuro: Alert and oriented x3, extra-ocular muscles intact.  HEENT: Normocephalic, atraumatic, pupils equal round reactive to light, neck supple, no masses, no lymphadenopathy, thyroid nonpalpable. Oropharynx is erythematous with signs of postnasal drip. She also has some erythema, as well as scaling in her left external auditory canal.  Skin: Warm and dry, no rashes. Cardiac: Regular rate and rhythm, no murmurs rubs or gallops.  Respiratory: Clear to auscultation bilaterally. Not using accessory muscles, speaking in full sentences. Lower extremities show 2+ pitting edema to the knees.   Impression and Recommendations:

## 2012-05-18 NOTE — Assessment & Plan Note (Addendum)
Likely due to dependent edema. Will go ahead and check CBC, CMET, BNP, TSH She should wear compression hose in the meantime. She also has some hydrochlorothiazide which she will take for a week. If this doesn't improve her swelling, I would recommend Demadex 20 mg daily for a week.

## 2012-05-18 NOTE — Assessment & Plan Note (Signed)
She has just gotten over a bout of acute sinusitis, and is status post azithromycin. She also is status post Ciprodex treatment for an otitis externa. I think symptoms now are related to postnasal drip. We'll add fluticasone nasal. She also has some atopic dermatitis in her external auditory meatus, I will add otic steroids.

## 2012-05-19 ENCOUNTER — Ambulatory Visit: Payer: Managed Care, Other (non HMO) | Admitting: Family Medicine

## 2012-05-19 ENCOUNTER — Encounter: Payer: Self-pay | Admitting: Sports Medicine

## 2012-05-19 LAB — BRAIN NATRIURETIC PEPTIDE: Brain Natriuretic Peptide: 11.5 pg/mL (ref 0.0–100.0)

## 2012-08-08 ENCOUNTER — Encounter: Payer: Self-pay | Admitting: *Deleted

## 2012-08-08 ENCOUNTER — Emergency Department (INDEPENDENT_AMBULATORY_CARE_PROVIDER_SITE_OTHER)
Admission: EM | Admit: 2012-08-08 | Discharge: 2012-08-08 | Disposition: A | Discharge status: Home / Self Care | Payer: Managed Care, Other (non HMO) | Source: Home / Self Care | Attending: Emergency Medicine | Admitting: Emergency Medicine

## 2012-08-08 DIAGNOSIS — H6092 Unspecified otitis externa, left ear: Secondary | ICD-10-CM

## 2012-08-08 DIAGNOSIS — H60399 Other infective otitis externa, unspecified ear: Secondary | ICD-10-CM

## 2012-08-08 DIAGNOSIS — H9210 Otorrhea, unspecified ear: Secondary | ICD-10-CM

## 2012-08-08 MED ORDER — DOXYCYCLINE HYCLATE 100 MG PO CAPS: 100.0000 mg | ORAL_CAPSULE | Freq: Two times a day (BID) | ORAL | Status: DC

## 2012-08-08 MED ORDER — CIPROFLOXACIN-HYDROCORTISONE 0.2-1 % OT SUSP: 3.0000 [drp] | Freq: Two times a day (BID) | OTIC | Status: DC

## 2012-08-08 NOTE — ED Notes (Signed)
Patient c/o ear drainage, pain changes in hearing x 3 wks. States she is on Augmentin for sinus infection but the ear pain got worse.

## 2012-08-08 NOTE — ED Provider Notes (Signed)
History     CSN: [id number]  Arrival date & time 08/08/12  1137   First MD Initiated Contact with Patient 08/08/12 1152      Chief Complaint  Patient presents with  . Otitis Media     Patient is a 41 y.o. female presenting with ear drainage. The history is provided by the patient.  Ear Drainage This is a new problem. Episode onset: about 10 days ago. The problem occurs hourly. The problem has been gradually worsening. Pertinent negatives include no chest pain, no abdominal pain, no headaches and no shortness of breath. Exacerbated by: laying on left ear. Nothing relieves the symptoms. Treatments tried: completed a seven-day course of PO Augmentin[facility], and sinus symptoms have resolved. The treatment provided mild relief.   She states the symptoms of sinusitis have resolved. No longer has any nasal congestion or rhinorrhea or facial pain. No fever or chills or neck pain or cough. She states her only concern is a persistent drainage from left ear. She states that she had MRSA left external canal 12 years ago, and she requests culture of the discharge from Left ear to rule out MRSA. She's had some chronic problems of dry skin left external canal. Past Medical History  Diagnosis Date  . 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 Polycystic ovary disease   . Allergic rhinitis   . Dysmenorrhea   . Dyslipidemia   . Obesity   . Gall bladder disease   . Infertility, female     Past Surgical History  Procedure Date  . Laparoscopic cholecystectomy   . Transthoracic echcardiogram 09/15/06    Family History  Problem Relation Age of Onset  . Thyroid disease Brother   . Thyroid disease Sister   . Hypertension Father   . Colon cancer Mother 28  . Breast cancer Mother   . Diabetes Mother   . Heart attack Mother     AMI, in late 68's  . Depression Mother   . Heart disease Mother     History  Substance Use Topics  . Smoking status:  Former Smoker    Quit date: 07/15/2002  . Smokeless tobacco: Not on file  . Alcohol Use: Not on file    OB History    Grav Para Term Preterm Abortions TAB SAB Ect Mult Living                  Review of Systems  Constitutional: Negative for fever.  HENT: Negative for nosebleeds, congestion, sore throat, facial swelling, rhinorrhea, sneezing and neck pain.   Eyes: Negative.   Respiratory: Negative for cough, shortness of breath and wheezing.   Cardiovascular: Negative for chest pain, palpitations and leg swelling.  Gastrointestinal: Negative for abdominal pain.  Genitourinary: Negative.   Musculoskeletal: Negative.   Neurological: Negative.  Negative for headaches.  Hematological: Negative.   Psychiatric/Behavioral: Negative.     Allergies  Neomycin  Home Medications   Current Outpatient Rx  Name  Route  Sig  Dispense  Refill  . CIPROFLOXACIN-HYDROCORTISONE 0.2-1 % OT SUSP   Left Ear   Place 3 drops into the left ear 2 (two) times daily. For 7 days   10 mL   0   . DOXYCYCLINE HYCLATE 100 MG PO CAPS   Oral   Take 1 capsule (100 mg total) by mouth 2 (two) times daily.   20 capsule   0   . FLUOCINOLONE ACETONIDE 0.01 %  OT OIL   Otic   Place 5 drops in ear(s) 2 (two) times daily as needed.   20 mL   3   . FLUTICASONE PROPIONATE 50 MCG/ACT NA SUSP      One spray in each nostril twice a day, use left hand for right nostril, and right hand for left nostril.   48 g   3   . HYDROCHLOROTHIAZIDE 12.5 MG PO CAPS   Oral   Take 1 capsule (12.5 mg total) by mouth daily. PRN for lower leg edema.   30 capsule   1   . IBUPROFEN 400 MG PO TABS   Oral   Take 400 mg by mouth every 6 (six) hours as needed. For pain/fever         . METFORMIN HCL 500 MG PO TABS   Oral   Take 1 tablet (500 mg total) by mouth 2 (two) times daily with a meal.   60 tablet   3     BP 117/72  Pulse 78  Temp 97.8 F (36.6 C) (Oral)  Resp 16  Ht 5\' 5"  (1.651 m)  Wt 298 lb (135.172 kg)   BMI 49.59 kg/m2  SpO2 98%  Physical Exam  Nursing note and vitals reviewed. Constitutional: She is oriented to person, place, and time. She appears well-developed and well-nourished. No distress.  HENT:  Head: Normocephalic and atraumatic.  Right Ear: Tympanic membrane normal.  Left Ear: Tympanic membrane normal.  Nose: Nose normal. No rhinorrhea. Right sinus exhibits no maxillary sinus tenderness and no frontal sinus tenderness. Left sinus exhibits no maxillary sinus tenderness and no frontal sinus tenderness.  Mouth/Throat: Oropharynx is clear and moist. No oral lesions.       no Tenderness over RIGHT external ear. no Swelling RIGHT external ear canal. no Discharge/debris in RIGHT external ear canal.  + Tenderness over LEFT external ear. + minimal Swelling LEFT external ear canal. + whitish, yellow Discharge/debris in LEFT external ear canal.   Eyes: Conjunctivae normal are normal.  Neck: Neck supple.  Cardiovascular: Normal heart sounds.   Pulmonary/Chest: Breath sounds normal.  Lymphadenopathy:    She has no cervical adenopathy.  Neurological: She is alert and oriented to person, place, and time.  Skin: Skin is warm and dry. No rash (except slightly dry skin left external canal) noted.    ED Course  Procedures (including critical care time)   Labs Reviewed  WOUND CULTURE   No results found.   1. Left otitis externa   2. Ear drainage       MDM  Discussed the diagnosis of left otitis externa. Workup and treatment options discussed. I swabbed the discharge from left external canal, sent for culture. After risk, benefits, alternative discussed, begin PO doxycycline 100 mg PID for 10 days. This is to particularly cover staph, MRSA, and some other bacterial causes.-She no longer has signs/symptoms of sinusitis. No evidence of otitis media. Cipro Otic HC prescribed,  three drops in left ear BID times seven days.-explained that Cipro has excellent Pseudomonas coverage,  and good coverage of some other bacterial causes. Other symptomatic care discussed. Red flags discussed. If no better in one week, see ENT. She voiced understanding and agreement with the above plans.         Lajean Manes, MD 08/08/12 1901

## 2012-08-12 ENCOUNTER — Telehealth: Payer: Self-pay | Admitting: *Deleted

## 2012-08-12 LAB — WOUND CULTURE
Gram Stain: NONE SEEN
Gram Stain: NONE SEEN

## 2013-04-20 ENCOUNTER — Encounter: Payer: Self-pay | Admitting: Family Medicine

## 2013-04-20 ENCOUNTER — Ambulatory Visit (INDEPENDENT_AMBULATORY_CARE_PROVIDER_SITE_OTHER): Payer: Managed Care, Other (non HMO) | Admitting: Family Medicine

## 2013-04-20 VITALS — BP 110/68 | HR 65 | Temp 97.9°F | Wt 297.0 lb

## 2013-04-20 DIAGNOSIS — J019 Acute sinusitis, unspecified: Secondary | ICD-10-CM

## 2013-04-20 DIAGNOSIS — J209 Acute bronchitis, unspecified: Secondary | ICD-10-CM

## 2013-04-20 MED ORDER — AZITHROMYCIN 250 MG PO TABS: ORAL_TABLET | ORAL | Status: DC

## 2013-04-20 NOTE — Progress Notes (Signed)
  Subjective:    Patient ID: Laura [name], female    DOB: 11/07/1970, 42 y.o.   MRN: [id number]  HPI Nonproductive cough and sinus congestion x 2 weeks. She's using DayQuil.  Vomited the first day.  No HA. No sinus pain  Clogged ear.  ST initially.  Course is worse at night and gets more productive during the night.  No more GI sxs.  No fever. Son was sick too.    Review of Systems     Objective:   Physical Exam  Constitutional: She is oriented to person, place, and time. She appears well-developed and well-nourished.  HENT:  Head: Normocephalic and atraumatic.  Right Ear: External ear normal.  Left Ear: External ear normal.  Nose: Nose normal.  Mouth/Throat: Oropharynx is clear and moist.  TMs and canals are clear.   Eyes: Conjunctivae and EOM are normal. Pupils are equal, round, and reactive to light.  Neck: Neck supple. No thyromegaly present.  Cardiovascular: Normal rate, regular rhythm and normal heart sounds.   Pulmonary/Chest: Effort normal and breath sounds normal. She has no wheezes.  Lymphadenopathy:    She has no cervical adenopathy.  Neurological: She is alert and oriented to person, place, and time.  Skin: Skin is warm and dry.  Psychiatric: She has a normal mood and affect.          Assessment & Plan:  Acute sinusitis/bronchitis - will tx zpack since PCN allergy.  Call if not better in 5-7 days.  Continue symptomatic care.

## 2013-04-20 NOTE — Patient Instructions (Signed)
Sinusitis Sinusitis is redness, soreness, and swelling (inflammation) of the paranasal sinuses. Paranasal sinuses are air pockets within the bones of your face (beneath the eyes, the middle of the forehead, or above the eyes). In healthy paranasal sinuses, mucus is able to drain out, and air is able to circulate through them by way of your nose. However, when your paranasal sinuses are inflamed, mucus and air can become trapped. This can allow bacteria and other germs to grow and cause infection. Sinusitis can develop quickly and last only a short time (acute) or continue over a long period (chronic). Sinusitis that lasts for more than 12 weeks is considered chronic.  CAUSES  Causes of sinusitis include:  Allergies.  Structural abnormalities, such as displacement of the cartilage that separates your nostrils (deviated septum), which can decrease the air flow through your nose and sinuses and affect sinus drainage.  Functional abnormalities, such as when the small hairs (cilia) that line your sinuses and help remove mucus do not work properly or are not present. SYMPTOMS  Symptoms of acute and chronic sinusitis are the same. The primary symptoms are pain and pressure around the affected sinuses. Other symptoms include:  Upper toothache.  Earache.  Headache.  Bad breath.  Decreased sense of smell and taste.  A cough, which worsens when you are lying flat.  Fatigue.  Fever.  Thick drainage from your nose, which often is green and may contain pus (purulent).  Swelling and warmth over the affected sinuses. DIAGNOSIS  Your caregiver will perform a physical exam. During the exam, your caregiver may:  Look in your nose for signs of abnormal growths in your nostrils (nasal polyps).  Tap over the affected sinus to check for signs of infection.  View the inside of your sinuses (endoscopy) with a special imaging device with a light attached (endoscope), which is inserted into your  sinuses. If your caregiver suspects that you have chronic sinusitis, one or more of the following tests may be recommended:  Allergy tests.  Nasal culture A sample of mucus is taken from your nose and sent to a lab and screened for bacteria.  Nasal cytology A sample of mucus is taken from your nose and examined by your caregiver to determine if your sinusitis is related to an allergy. TREATMENT  Most cases of acute sinusitis are related to a viral infection and will resolve on their own within 10 days. Sometimes medicines are prescribed to help relieve symptoms (pain medicine, decongestants, nasal steroid sprays, or saline sprays).  However, for sinusitis related to a bacterial infection, your caregiver will prescribe antibiotic medicines. These are medicines that will help kill the bacteria causing the infection.  Rarely, sinusitis is caused by a fungal infection. In theses cases, your caregiver will prescribe antifungal medicine. For some cases of chronic sinusitis, surgery is needed. Generally, these are cases in which sinusitis recurs more than 3 times per year, despite other treatments. HOME CARE INSTRUCTIONS   Drink plenty of water. Water helps thin the mucus so your sinuses can drain more easily.  Use a humidifier.  Inhale steam 3 to 4 times a day (for example, sit in the bathroom with the shower running).  Apply a warm, moist washcloth to your face 3 to 4 times a day, or as directed by your caregiver.  Use saline nasal sprays to help moisten and clean your sinuses.  Take over-the-counter or prescription medicines for pain, discomfort, or fever only as directed by your caregiver. SEEK IMMEDIATE MEDICAL   CARE IF:  You have increasing pain or severe headaches.  You have nausea, vomiting, or drowsiness.  You have swelling around your face.  You have vision problems.  You have a stiff neck.  You have difficulty breathing. MAKE SURE YOU:   Understand these  instructions.  Will watch your condition.  Will get help right away if you are not doing well or get worse. Document Released: 07/28/2005 Document Revised: 10/20/2011 Document Reviewed: 08/12/2011 [facility] Patient Information 2014 [place], [place]. Acute Bronchitis You have acute bronchitis. This means you have a chest cold. The airways in your lungs are red and sore (inflamed). Acute means it is sudden onset.  CAUSES Bronchitis is most often caused by the same virus that causes a cold. SYMPTOMS   Body aches.  Chest congestion.  Chills.  Cough.  Fever.  Shortness of breath.  Sore throat. TREATMENT  Acute bronchitis is usually treated with rest, fluids, and medicines for relief of fever or cough. Most symptoms should go away after a few days or a week. Increased fluids may help thin your secretions and will prevent dehydration. Your caregiver may give you an inhaler to improve your symptoms. The inhaler reduces shortness of breath and helps control cough. You can take over-the-counter pain relievers or cough medicine to decrease coughing, pain, or fever. A cool-air vaporizer may help thin bronchial secretions and make it easier to clear your chest. Antibiotics are usually not needed but can be prescribed if you smoke, are seriously ill, have chronic lung problems, are elderly, or you are at higher risk for developing complications.Allergies and asthma can make bronchitis worse. Repeated episodes of bronchitis may cause longstanding lung problems. Avoid smoking and secondhand smoke.Exposure to cigarette smoke or irritating chemicals will make bronchitis worse. If you are a cigarette smoker, consider using nicotine gum or skin patches to help control withdrawal symptoms. Quitting smoking will help your lungs heal faster. Recovery from bronchitis is often slow, but you should start feeling better after 2 to 3 days. Cough from bronchitis frequently lasts for 3 to 4 weeks. To prevent  another bout of acute bronchitis:  Quit smoking.  Wash your hands frequently to get rid of viruses or use a hand sanitizer.  Avoid other people with cold or virus symptoms.  Try not to touch your hands to your mouth, nose, or eyes. SEEK IMMEDIATE MEDICAL CARE IF:  You develop increased fever, chills, or chest pain.  You have severe shortness of breath or bloody sputum.  You develop dehydration, fainting, repeated vomiting, or a severe headache.  You have no improvement after 1 week of treatment or you get worse. MAKE SURE YOU:   Understand these instructions.  Will watch your condition.  Will get help right away if you are not doing well or get worse. Document Released: 09/04/2004 Document Revised: 10/20/2011 Document Reviewed: 11/20/2010 [facility] Patient Information 2014 [place], [place].

## 2013-05-17 LAB — HM PAP SMEAR: HM Pap smear: NEGATIVE

## 2013-08-26 ENCOUNTER — Ambulatory Visit (INDEPENDENT_AMBULATORY_CARE_PROVIDER_SITE_OTHER): Payer: Managed Care, Other (non HMO) | Admitting: Family Medicine

## 2013-08-26 ENCOUNTER — Encounter: Payer: Self-pay | Admitting: Family Medicine

## 2013-08-26 VITALS — BP 118/69 | HR 78 | Temp 97.9°F | Wt 297.0 lb

## 2013-08-26 DIAGNOSIS — S161XXA Strain of muscle, fascia and tendon at neck level, initial encounter: Secondary | ICD-10-CM

## 2013-08-26 DIAGNOSIS — J069 Acute upper respiratory infection, unspecified: Secondary | ICD-10-CM

## 2013-08-26 DIAGNOSIS — S139XXA Sprain of joints and ligaments of unspecified parts of neck, initial encounter: Secondary | ICD-10-CM

## 2013-08-26 MED ORDER — METHOCARBAMOL 500 MG PO TABS: 500.0000 mg | ORAL_TABLET | Freq: Three times a day (TID) | ORAL | Status: DC | PRN

## 2013-08-26 NOTE — Progress Notes (Signed)
CC: Laura [name] is a 43 y.o. female is here for Neck Pain   Subjective: HPI:  Complains of right-sided posterior neck pain that has been present for the past 3 days with a gradual onset mild to moderate in severity worse with turning her head to the right described as a pain when doing so. When turning to the left she feels a tightness in the same location. It is nonradiating. Has not responded to ibuprofen 800 mg no other interventions. It is significantly improved temporarily with massages. She's never had this before. She does report low-grade subjective fever over the past 12 hours accompanied by nasal congestion. She denies photophobia, rash, chills, night sweats, dysphagia, cough, shortness of breath.   Review Of Systems Outlined In HPI  Past Medical History  Diagnosis Date  . 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 Polycystic ovary disease   . Allergic rhinitis   . Dysmenorrhea   . Dyslipidemia   . Obesity   . Gall bladder disease   . Infertility, female      Family History  Problem Relation Age of Onset  . Thyroid disease Brother   . Thyroid disease Sister   . Hypertension Father   . Colon cancer Mother 13  . Breast cancer Mother   . Diabetes Mother   . Heart attack Mother     AMI, in late 22's  . Depression Mother   . Heart disease Mother      History  Substance Use Topics  . Smoking status: Former Smoker    Quit date: 07/15/2002  . Smokeless tobacco: Not on file  . Alcohol Use: Not on file     Objective: Filed Vitals:   08/26/13 1458  BP: 118/69  Pulse: 78  Temp: 97.9 F (36.6 C)    General: Alert and Oriented, No Acute Distress HEENT: Pupils equal, round, reactive to light. Conjunctivae clear.  External ears unremarkable, canals clear with intact TMs with appropriate landmarks.  Middle ear appears open without effusion. Pink inferior turbinates.  Moist mucous membranes, pharynx without inflammation nor lesions.  Neck supple without palpable  lymphadenopathy nor abnormal masses. Lungs: Clear to auscultation bilaterally, no wheezing/ronchi/rales.  Comfortable work of breathing. Good air movement. Cardiac: Regular rate and rhythm. Normal S1/S2.  No murmurs, rubs, nor gallops.   Back: Full flexion and extension of the C-spine full range of motion of the C-spine been rotating left however patient hesitant to rotate to the right greater than 45 due to discomfort. No midline spinous process tenderness in the C-spine. Pain is reproduced with palpation of the upper trapezius on the right which is mildly hypertonic. Mental Status: No depression, anxiety, nor agitation. Skin: Warm and dry.  Assessment & Plan: Shilpa was seen today for neck pain.  Diagnoses and associated orders for this visit:  Neck strain - methocarbamol (ROBAXIN) 500 MG tablet; Take 1 tablet (500 mg total) by mouth every 8 (eight) hours as needed (Neck Pain).  Viral URI    Neck strain: Start Robaxin, encouraged heat, she was given a handout on range of motion exercises and she plans on going to get a massage every other day for the next few days which I think is a great idea as long as it does not exacerbate her pain. Viral URI: Symptomatic care including Alka-Seltzer cold and sinus call if symptoms are persistent for greater than one week.  Return if symptoms worsen or fail to improve.

## 2013-08-30 ENCOUNTER — Telehealth: Payer: Self-pay | Admitting: Family Medicine

## 2013-08-30 NOTE — Telephone Encounter (Signed)
I spoke to patient but this message should have been routed to Dr. Ileene Rubens nurse because that is who she seen,  Dr. Darene Lamer has not seen this patient since 2013. Patient has been transferred up front to schedule appt with Dr. Ileene Rubens.

## 2013-08-31 ENCOUNTER — Ambulatory Visit (INDEPENDENT_AMBULATORY_CARE_PROVIDER_SITE_OTHER): Payer: Managed Care, Other (non HMO) | Admitting: Family Medicine

## 2013-08-31 ENCOUNTER — Ambulatory Visit (INDEPENDENT_AMBULATORY_CARE_PROVIDER_SITE_OTHER): Payer: Managed Care, Other (non HMO)

## 2013-08-31 ENCOUNTER — Encounter: Payer: Self-pay | Admitting: Family Medicine

## 2013-08-31 VITALS — BP 136/71 | HR 73 | Wt 297.0 lb

## 2013-08-31 DIAGNOSIS — M542 Cervicalgia: Secondary | ICD-10-CM

## 2013-08-31 DIAGNOSIS — M6520 Calcific tendinitis, unspecified site: Secondary | ICD-10-CM

## 2013-08-31 MED ORDER — MELOXICAM 15 MG PO TABS: 15.0000 mg | ORAL_TABLET | Freq: Every day | ORAL | Status: DC

## 2013-08-31 MED ORDER — HYDROCODONE-ACETAMINOPHEN 5-325 MG PO TABS: 1.0000 | ORAL_TABLET | Freq: Four times a day (QID) | ORAL | Status: DC | PRN

## 2013-08-31 NOTE — Progress Notes (Signed)
CC: Laura [name] is a 43 y.o. female is here for Neck Pain   Subjective: HPI:  Complains of continued posterior neck pain localized mostly on the right side that has not been present for about a week no significant improvement since being seen last week. She has been taking ibuprofen and Robaxin getting 25% relief for one to 2 hours after taking medication. It is helped with range of motion and pain however temporarily. Pain is described as a tightness and soreness radiating from the inferior of her scalp to the base of her neck and to the right posterior shoulder. She denies joint pain. Pain is worse with rotation to the right tightness reproduced with rotation to the left. She denies any decline in symptoms, fevers, chills, photophobia, motor sensory disturbances, rashes, difficulty swallowing nor any pulmonary complaints   Review Of Systems Outlined In HPI  Past Medical History  Diagnosis Date  . 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 Polycystic ovary disease   . Allergic rhinitis   . Dysmenorrhea   . Dyslipidemia   . Obesity   . Gall bladder disease   . Infertility, female      Family History  Problem Relation Age of Onset  . Thyroid disease Brother   . Thyroid disease Sister   . Hypertension Father   . Colon cancer Mother 27  . Breast cancer Mother   . Diabetes Mother   . Heart attack Mother     AMI, in late 43's  . Depression Mother   . Heart disease Mother      History  Substance Use Topics  . Smoking status: Former Smoker    Quit date: 07/15/2002  . Smokeless tobacco: Not on file  . Alcohol Use: Not on file     Objective: Filed Vitals:   08/31/13 1117  BP: 136/71  Pulse: 73    General: Alert and Oriented, No Acute Distress HEENT: Pupils equal, round, reactive to light. Conjunctivae clear.  External ears unremarkable, canals clear with intact TMs with appropriate landmarks.  Middle ear appears open without effusion. Pink inferior turbinates.  Moist  mucous membranes, pharynx without inflammation nor lesions.  Neck supple without palpable lymphadenopathy nor abnormal masses. Back: Mild cervical spine midline tenderness to palpation at approximately C6 however no tenderness otherwise, pain is reproduced with paraspinal musculature palpation especially on the right paracervical musculature, again pain is reproduced with palpation of medial upper trapezius. No overlying skin changes. Spurling's is negative bilaterally Lungs: Clear to auscultation bilaterally, no wheezing/ronchi/rales.  Comfortable work of breathing. Good air movement. Extremities: No peripheral edema.  Strong peripheral pulses. Full range of motion and strength of both upper extremities from the shoulders distally along with C6-C5 DTR two over four bilaterally Mental Status: No depression, anxiety, nor agitation. Skin: Warm and dry.  Assessment & Plan: [name] was seen today for neck pain.  Diagnoses and associated orders for this visit:  Neck pain - DG Cervical Spine Complete; Future - HYDROcodone-acetaminophen (NORCO/VICODIN) 5-325 MG per tablet; Take 1 tablet by mouth every 6 (six) hours as needed for moderate pain. - meloxicam (MOBIC) 15 MG tablet; Take 1 tablet (15 mg total) by mouth daily. For neck pain.    Neck pain: Discussed with patient that pain is still most likely due to muscular strain however given her concern and lack of improvement plain films of the cervical spine were obtained today without any significant abnormality of the spine or discs to contribute to her  pain. I encouraged her to continue with Robaxin, switch from ibuprofen to Mobic, Vicodin to help with getting through home rehabilitation plan. If not improved by Monday or she wishes to seek a second opinion I strongly encouraged her to see Dr. Darene Lamer. in sports medicine for sports medicine referral  25 minutes spent face-to-face during visit today of which at least 50% was counseling or coordinating care  regarding: 1. Neck pain      Return in about 1 week (around 09/07/2013) for Dr. Darene Lamer Sports Medicine Referral.

## 2013-09-28 ENCOUNTER — Encounter: Payer: Self-pay | Admitting: Physician Assistant

## 2013-09-28 ENCOUNTER — Ambulatory Visit (INDEPENDENT_AMBULATORY_CARE_PROVIDER_SITE_OTHER): Payer: Managed Care, Other (non HMO) | Admitting: Physician Assistant

## 2013-09-28 VITALS — BP 120/68 | HR 63 | Wt 300.0 lb

## 2013-09-28 DIAGNOSIS — H00019 Hordeolum externum unspecified eye, unspecified eyelid: Secondary | ICD-10-CM

## 2013-09-28 MED ORDER — ERYTHROMYCIN 5 MG/GM OP OINT: 1.0000 "application " | TOPICAL_OINTMENT | Freq: Four times a day (QID) | OPHTHALMIC | Status: AC

## 2013-09-28 NOTE — Patient Instructions (Addendum)
Warm compresses.  Erythromycin ointment for 5 days.  Sty A sty (hordeolum) is an infection of a gland in the eyelid located at the base of the eyelash. A sty may develop a white or yellow head of pus. It can be puffy (swollen). Usually, the sty will burst and pus will come out on its own. They do not leave lumps in the eyelid once they drain. A sty is often confused with another form of cyst of the eyelid called a chalazion. Chalazions occur within the eyelid and not on the edge where the bases of the eyelashes are. They often are red, sore and then form firm lumps in the eyelid. CAUSES   Germs (bacteria).  Lasting (chronic) eyelid inflammation. SYMPTOMS   Tenderness, redness and swelling along the edge of the eyelid at the base of the eyelashes.  Sometimes, there is a white or yellow head of pus. It may or may not drain. DIAGNOSIS  An ophthalmologist will be able to distinguish between a sty and a chalazion and treat the condition appropriately.  TREATMENT   Styes are typically treated with warm packs (compresses) until drainage occurs.  In rare cases, medicines that kill germs (antibiotics) may be prescribed. These antibiotics may be in the form of drops, cream or pills.  If a hard lump has formed, it is generally necessary to do a small incision and remove the hardened contents of the cyst in a minor surgical procedure done in the office.  In suspicious cases, your caregiver may send the contents of the cyst to the lab to be certain that it is not a rare, but dangerous form of cancer of the glands of the eyelid. HOME CARE INSTRUCTIONS   Wash your hands often and dry them with a clean towel. Avoid touching your eyelid. This may spread the infection to other parts of the eye.  Apply heat to your eyelid for 10 to 20 minutes, several times a day, to ease pain and help to heal it faster.  Do not squeeze the sty. Allow it to drain on its own. Wash your eyelid carefully 3 to 4 times per  day to remove any pus. SEEK IMMEDIATE MEDICAL CARE IF:   Your eye becomes painful or puffy (swollen).  Your vision changes.  Your sty does not drain by itself within 3 days.  Your sty comes back within a short period of time, even with treatment.  You have redness (inflammation) around the eye.  You have a fever. Document Released: 05/07/2005 Document Revised: 10/20/2011 Document Reviewed: 01/09/2009 [facility] Patient Information 2014 [place].

## 2013-09-28 NOTE — Progress Notes (Signed)
   Subjective:    Patient ID: Laura [name], female    DOB: [birth year], 43 y.o.   MRN: [id number]  HPI Pt is a 43 yo female who presents to the clinic with red, swollen right upper eyelid. Yesterday her daughter noticed her eye looked funny. This morning she woke up and felt like her eye was bruised and felt like it was heavy. Vision is a little blurry but no vision loss. No drainage. No fever, chills.     Review of Systems     Objective:   Physical Exam  Constitutional: She is oriented to person, place, and time. She appears well-developed and well-nourished.  HENT:  Head: Normocephalic and atraumatic.  Eyes: Conjunctivae and EOM are normal. Pupils are equal, round, and reactive to light. Right eye exhibits no discharge. Left eye exhibits no discharge.    Neck: Normal range of motion. Neck supple.  Lymphadenopathy:    She has no cervical adenopathy.  Neurological: She is alert and oriented to person, place, and time.  Psychiatric: She has a normal mood and affect. Her behavior is normal.          Assessment & Plan:  Right upper eyelid sty- Reassured pt there is no head today. Recommended warm compresses and sent erythromycin ointment 4 times a day for 5 days. ibuprofen for discomfort of pain. Call if not improving or worsening. There was some redness potential concern for cellulitis developing.

## 2013-10-04 ENCOUNTER — Other Ambulatory Visit: Payer: Self-pay | Admitting: Physician Assistant

## 2013-10-04 ENCOUNTER — Telehealth: Payer: Self-pay | Admitting: *Deleted

## 2013-10-04 MED ORDER — DOXYCYCLINE HYCLATE 100 MG PO TABS
100.0000 mg | ORAL_TABLET | Freq: Two times a day (BID) | ORAL | Status: DC
Start: 2013-10-04 — End: 2014-04-05

## 2013-10-04 NOTE — Telephone Encounter (Signed)
Pt left message that her eye lid got better for a couple of days but now it has gotten worse.  It's red again & really tender & painful.

## 2013-10-04 NOTE — Telephone Encounter (Signed)
Continue with warm compresses. Will send over oral abx. Call if not improving.

## 2013-10-04 NOTE — Telephone Encounter (Signed)
Pt.notified

## 2013-12-07 DIAGNOSIS — R002 Palpitations: Secondary | ICD-10-CM | POA: Insufficient documentation

## 2014-01-11 DIAGNOSIS — E559 Vitamin D deficiency, unspecified: Secondary | ICD-10-CM | POA: Insufficient documentation

## 2014-01-11 DIAGNOSIS — E161 Other hypoglycemia: Secondary | ICD-10-CM | POA: Insufficient documentation

## 2014-03-16 ENCOUNTER — Other Ambulatory Visit: Payer: Self-pay | Admitting: Physician Assistant

## 2014-04-05 ENCOUNTER — Encounter: Payer: Self-pay | Admitting: Family Medicine

## 2014-04-05 ENCOUNTER — Ambulatory Visit (INDEPENDENT_AMBULATORY_CARE_PROVIDER_SITE_OTHER): Payer: Managed Care, Other (non HMO) | Admitting: Family Medicine

## 2014-04-05 VITALS — BP 122/78 | HR 79 | Wt 287.0 lb

## 2014-04-05 DIAGNOSIS — R030 Elevated blood-pressure reading, without diagnosis of hypertension: Secondary | ICD-10-CM

## 2014-04-05 DIAGNOSIS — H539 Unspecified visual disturbance: Secondary | ICD-10-CM

## 2014-04-05 DIAGNOSIS — IMO0001 Reserved for inherently not codable concepts without codable children: Secondary | ICD-10-CM

## 2014-04-05 DIAGNOSIS — F329 Major depressive disorder, single episode, unspecified: Secondary | ICD-10-CM

## 2014-04-05 DIAGNOSIS — M25473 Effusion, unspecified ankle: Secondary | ICD-10-CM

## 2014-04-05 DIAGNOSIS — F3289 Other specified depressive episodes: Secondary | ICD-10-CM

## 2014-04-05 DIAGNOSIS — F4321 Adjustment disorder with depressed mood: Secondary | ICD-10-CM

## 2014-04-05 DIAGNOSIS — F32A Depression, unspecified: Secondary | ICD-10-CM

## 2014-04-05 DIAGNOSIS — M25476 Effusion, unspecified foot: Secondary | ICD-10-CM

## 2014-04-05 MED ORDER — HYDROCHLOROTHIAZIDE 12.5 MG PO CAPS: 12.5000 mg | ORAL_CAPSULE | Freq: Every day | ORAL | Status: DC

## 2014-04-05 MED ORDER — ESCITALOPRAM OXALATE 10 MG PO TABS: 10.0000 mg | ORAL_TABLET | Freq: Every day | ORAL | Status: DC

## 2014-04-05 NOTE — Progress Notes (Signed)
Subjective:    Patient ID: Laura [name], female    DOB: 03/03/1971, 43 y.o.   MRN: [id number]  Hypertension   Patient had one episode of blurry vision. She says it lasted about an hour. She had her daughter took her blood pressure and at that time her systolic blood pressure was in the 190s. They checked a little bit later and it came down to 140. The next day she actually felt fine. Has not happened again since then. She does take Hydrocort Dyazide only as needed when traveling for lower [place] swelling and not on a regular basis.  Onset was very sudden.  She was with her father and they were going over life insurance paperwork.  She does work nights. Thought it could be anxiety or lack of sleep.  Feels like her eye s are strained. Eyes are dry but not painful. Has had more LE swelling. Has been driving more than usual.   She's also been under a lot of stress lately. Her mother passed away and she is selling her home. Her father is moving in with her. She's having difficulty with her teenage son. She feels that her husband has checked out somewhat. Herself and her son is also a type I diabetic and is not really taking good care of himself. She just feels overwhelmed. Plus she's working at night and only getting 3-4 hours of sleep at night because of the jaw. Not because of difficulty sleeping. She says she's really starting to feel down and sad and depressed. She complains of little interest or pleasure in doing things several days a week and feeling down and depressed and hopeless several days a week. She also feels completely exhausted and feels bad about herself is having difficulty concentrating.   Review of Systems     Objective:   Physical Exam  Constitutional: She is oriented to person, place, and time. She appears well-developed and well-nourished.  HENT:  Head: Normocephalic and atraumatic.  Right Ear: External ear normal.  Left Ear: External ear normal.  Nose: Nose normal.   Mouth/Throat: Oropharynx is clear and moist.  TMs and canals are clear.   Eyes: Conjunctivae and EOM are normal. Pupils are equal, round, and reactive to light.  Neck: Neck supple. No thyromegaly present.  Cardiovascular: Normal rate, regular rhythm and normal heart sounds.   Pulmonary/Chest: Effort normal and breath sounds normal. She has no wheezes.  Lymphadenopathy:    She has no cervical adenopathy.  Neurological: She is alert and oriented to person, place, and time.  Skin: Skin is warm and dry.  Psychiatric: She has a normal mood and affect.          Assessment & Plan:  Elevated blood pressure-her blood pressure looks fantastic today. It looks at goal. Not sure me a call the sudden surge in her pressure. It may have actually been a panic attack or anxiety. We will continue to keep an eye on this. Next  Vision change-do recommend a full eye exam especially since she's noticing that she's been straining a little bit more than usual. But certainly the acute blood pressure elevation could have caused vision change and vice versa anxiety or panic may have caused vision changes well. visoin 20/40 but same in both eyes.    Grieving/depression-PHQ 9 score of 9 today. Not well controlled. She said years ago she took Lexapro for postpartum depression and did well with that. We discussed the option of counseling or therapy which I think  would be very helpful versus medication. At this time she said she would like to try starting the Lexapro again but wants to hold off on therapy or counseling. I will see her back in one month.  Ankle swelling-continue hydrochlorothiazide as needed. Nearly fills. She's had one prescription lasts for almost 2 years.

## 2014-04-10 DIAGNOSIS — E782 Mixed hyperlipidemia: Secondary | ICD-10-CM | POA: Insufficient documentation

## 2014-05-04 ENCOUNTER — Other Ambulatory Visit: Payer: Self-pay

## 2014-05-04 MED ORDER — HYDROCHLOROTHIAZIDE 12.5 MG PO CAPS: 12.5000 mg | ORAL_CAPSULE | Freq: Every day | ORAL | Status: DC

## 2014-05-04 MED ORDER — ESCITALOPRAM OXALATE 10 MG PO TABS: 10.0000 mg | ORAL_TABLET | Freq: Every day | ORAL | Status: DC

## 2014-05-08 ENCOUNTER — Ambulatory Visit: Payer: Managed Care, Other (non HMO) | Admitting: Family Medicine

## 2014-05-08 ENCOUNTER — Telehealth: Payer: Self-pay | Admitting: Family Medicine

## 2014-05-08 DIAGNOSIS — Z0289 Encounter for other administrative examinations: Secondary | ICD-10-CM

## 2014-05-08 NOTE — Telephone Encounter (Signed)
Patient has been deemed a "no-show" for today's scheduled appointment.  Based on chief complaint and my chart review:  -Follow-up advised.  Contacting patient and urged to re-schedule. .  If necessary this was communicated to the patient via phone or mail on:

## 2014-05-09 NOTE — Telephone Encounter (Signed)
I left vm for Laura [name] to call back to reschedule her appt.

## 2014-05-11 DIAGNOSIS — Z23 Encounter for immunization: Secondary | ICD-10-CM | POA: Insufficient documentation

## 2014-05-11 DIAGNOSIS — R0683 Snoring: Secondary | ICD-10-CM | POA: Insufficient documentation

## 2014-05-11 DIAGNOSIS — R5383 Other fatigue: Secondary | ICD-10-CM | POA: Insufficient documentation

## 2014-05-11 NOTE — Telephone Encounter (Signed)
Appt scheduled for 10/2 at 9:15.

## 2014-05-12 ENCOUNTER — Encounter: Payer: Self-pay | Admitting: Family Medicine

## 2014-05-12 ENCOUNTER — Ambulatory Visit (INDEPENDENT_AMBULATORY_CARE_PROVIDER_SITE_OTHER): Payer: Managed Care, Other (non HMO) | Admitting: Family Medicine

## 2014-05-12 VITALS — BP 142/69 | HR 71 | Wt 286.0 lb

## 2014-05-12 DIAGNOSIS — Z9289 Personal history of other medical treatment: Secondary | ICD-10-CM

## 2014-05-12 DIAGNOSIS — F32A Depression, unspecified: Secondary | ICD-10-CM

## 2014-05-12 DIAGNOSIS — Z1231 Encounter for screening mammogram for malignant neoplasm of breast: Secondary | ICD-10-CM

## 2014-05-12 DIAGNOSIS — Z803 Family history of malignant neoplasm of breast: Secondary | ICD-10-CM

## 2014-05-12 DIAGNOSIS — F329 Major depressive disorder, single episode, unspecified: Secondary | ICD-10-CM

## 2014-05-12 MED ORDER — METFORMIN HCL 500 MG PO TABS: 500.0000 mg | ORAL_TABLET | Freq: Two times a day (BID) | ORAL | Status: DC

## 2014-05-12 NOTE — Progress Notes (Signed)
   Subjective:    Patient ID: Laura [name], female    DOB: November 06, 1970, 43 y.o.   MRN: [id number]  HPI F/U depression - seen 4 weeks ago and started on Lexapro. She had been on it before.  She does complain of feeling down and depressed several days a week. Difficulty with sleep and trouble concentrating. She also complains of feeling easily annoyed and irritable and trouble relaxing. He denies feeling nervous and anxious. She is crying less. Stil doesn't get much cleep bc of her job.   Due for mammogram due to family history or BrCa.   Obesity/BMI 47-she's been going to the bariatric clinic here locally. She is actually planning on getting weight loss surgery. She needs a notes from 2014. Insulin so that appointment Dr. Stanford Breed to get cleared for surgery.   Review of Systems     Objective:   Physical Exam  Constitutional: She is oriented to person, place, and time. She appears well-developed and well-nourished.  HENT:  Head: Normocephalic and atraumatic.  Cardiovascular: Normal rate, regular rhythm and normal heart sounds.   Pulmonary/Chest: Effort normal and breath sounds normal.  Neurological: She is alert and oriented to person, place, and time.  Skin: Skin is warm and dry.  Psychiatric: She has a normal mood and affect. Her behavior is normal.          Assessment & Plan:  Depression - PHQ-9 score of 6 today, down from previous of 9.  GAD- 7 score of 3.  Will continue Lexapro 22m.  Continue work on sleep.    Obesity/BMI 47-planned for weight loss surgery.   Mammo ordered.

## 2014-05-23 ENCOUNTER — Telehealth: Payer: Self-pay | Admitting: Cardiology

## 2014-05-23 NOTE — Telephone Encounter (Signed)
Pt needs clarence for bariatric surgery. She have not been seen by a cardiologist since 2013. Will she need appt before she is cleared?

## 2014-05-23 NOTE — Telephone Encounter (Signed)
Yes, dr Stanford Breed will not give clearance without being seen in one year.

## 2014-06-15 ENCOUNTER — Ambulatory Visit (INDEPENDENT_AMBULATORY_CARE_PROVIDER_SITE_OTHER): Payer: Managed Care, Other (non HMO)

## 2014-06-15 DIAGNOSIS — Z9289 Personal history of other medical treatment: Secondary | ICD-10-CM

## 2014-06-15 DIAGNOSIS — Z803 Family history of malignant neoplasm of breast: Secondary | ICD-10-CM

## 2014-06-15 DIAGNOSIS — Z1231 Encounter for screening mammogram for malignant neoplasm of breast: Secondary | ICD-10-CM

## 2014-06-19 ENCOUNTER — Encounter: Payer: Self-pay | Admitting: Family Medicine

## 2014-06-19 ENCOUNTER — Telehealth: Payer: Self-pay | Admitting: *Deleted

## 2014-06-19 ENCOUNTER — Telehealth: Payer: Self-pay | Admitting: Family Medicine

## 2014-06-19 ENCOUNTER — Ambulatory Visit (INDEPENDENT_AMBULATORY_CARE_PROVIDER_SITE_OTHER): Payer: Managed Care, Other (non HMO) | Admitting: Family Medicine

## 2014-06-19 VITALS — BP 103/55 | HR 73 | Temp 97.8°F | Ht 64.5 in | Wt 285.0 lb

## 2014-06-19 DIAGNOSIS — Z23 Encounter for immunization: Secondary | ICD-10-CM

## 2014-06-19 DIAGNOSIS — K219 Gastro-esophageal reflux disease without esophagitis: Secondary | ICD-10-CM

## 2014-06-19 DIAGNOSIS — Z01419 Encounter for gynecological examination (general) (routine) without abnormal findings: Secondary | ICD-10-CM

## 2014-06-19 DIAGNOSIS — G4733 Obstructive sleep apnea (adult) (pediatric): Secondary | ICD-10-CM

## 2014-06-19 DIAGNOSIS — Z9989 Dependence on other enabling machines and devices: Secondary | ICD-10-CM | POA: Insufficient documentation

## 2014-06-19 NOTE — Telephone Encounter (Signed)
Please call sleep lab with Dr. Silvano Rusk office and get copy of sleep study.

## 2014-06-19 NOTE — Progress Notes (Signed)
  Subjective:     Laura [name] is a 43 y.o. female and is here for a comprehensive physical exam. The patient reports no problems.She's planning on having gastric sleeve bariatric surgery and the next couple of months. She just has to finish her as day. In the mid may go ahead and put her on a liver shrieking diet. She also has a medical clearance letter for today for me to complete and fax over to her office.  History   Social History  . Marital Status: Married    Spouse Name: Laura [name]    Number of Children: 5  . Years of Education: N/A   Occupational History  . 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 Smoking status: Former Smoker    Quit date: 07/15/2002  . Smokeless tobacco: Not on file  . Alcohol Use: No  . Drug Use: No  . Sexual Activity:    Partners: Male   Other Topics Concern  . Not on file   Social History Narrative   Some exercise.  In bariatric program at [facility].    Health Maintenance  Topic Date Due  . TETANUS/TDAP  08/11/2012  . INFLUENZA VACCINE  03/12/2015  . PAP SMEAR  05/12/2016    The following portions of the patient's history were reviewed and updated as appropriate: allergies, current medications, past family history, past medical history, past social history, past surgical history and problem list.  Review of Systems A comprehensive review of systems was negative.   Objective:    BP 103/55 mmHg  Pulse 73  Temp(Src) 97.8 F (36.6 C)  Ht 5' 4.5" (1.638 m)  Wt 285 lb (129.275 kg)  BMI 48.18 kg/m2  SpO2 96%  LMP 06/01/2014 (Approximate) General appearance: alert, cooperative and appears stated age Head: Normocephalic, without obvious abnormality, atraumatic Eyes: conjunctiva clear, extraocular movements intact, PERRLA Ears: normal TM's and external ear canals both ears Nose: Nares normal. Septum midline. Mucosa normal. No drainage or sinus tenderness. Throat: lips, mucosa, and tongue normal; teeth and gums  normal Neck: no adenopathy, no carotid bruit, no JVD, supple, symmetrical, trachea midline and thyroid not enlarged, symmetric, no tenderness/mass/nodules Back: symmetric, no curvature. ROM normal. No CVA tenderness. Lungs: clear to auscultation bilaterally Heart: regular rate and rhythm, S1, S2 normal, no murmur, click, rub or gallop Abdomen: soft, non-tender; bowel sounds normal; no masses,  no organomegaly Pelvic: deferred Extremities: extremities normal, atraumatic, no cyanosis or edema Pulses: 2+ and symmetric Skin: Skin color, texture, turgor normal. No rashes or lesions Lymph nodes: Cervical adenopathy: Normal and Axillary adenopathy: Normal Neurologic: Alert and oriented X 3, normal strength and tone. Normal symmetric reflexes. Normal coordination and gait    Assessment:    Healthy female exam.      Plan:     See After Visit Summary for Counseling Recommendations  Keep up a regular exercise program and make sure you are eating a healthy diet Try to eat 4 servings of dairy a day, or if you are lactose intolerant take a calcium with vitamin D daily.  Your vaccines are up to date.   Severe obesity, BMI greater than 40-medical clearance letter completed today. Plans on getting back on track with her diet. She was drinking a lot of shakes that sort of got off track.  We held off on labwork today because she said she is actually going to gain a lot of blood work preoperatively. She thinks they will check her cholesterol as well.

## 2014-06-19 NOTE — Addendum Note (Signed)
Addended by: Teddy Spike on: 06/19/2014 05:03 PM   Modules accepted: Orders

## 2014-06-23 ENCOUNTER — Ambulatory Visit: Payer: Managed Care, Other (non HMO) | Admitting: Family Medicine

## 2014-06-23 NOTE — Telephone Encounter (Signed)
Left detailed message for [place] Address: [address] [place], [place] [address] [phone] to fax sleep study or call back.

## 2014-06-26 NOTE — Telephone Encounter (Signed)
Fax received today at 5:34 pm.

## 2014-06-27 NOTE — Telephone Encounter (Signed)
error 

## 2014-07-19 ENCOUNTER — Encounter (HOSPITAL_COMMUNITY): Payer: Self-pay | Admitting: Internal Medicine

## 2014-07-31 ENCOUNTER — Other Ambulatory Visit: Payer: Self-pay | Admitting: Family Medicine

## 2014-08-07 ENCOUNTER — Ambulatory Visit (INDEPENDENT_AMBULATORY_CARE_PROVIDER_SITE_OTHER): Payer: Managed Care, Other (non HMO) | Admitting: Family Medicine

## 2014-08-07 ENCOUNTER — Other Ambulatory Visit: Payer: Self-pay | Admitting: Family Medicine

## 2014-08-07 ENCOUNTER — Encounter: Payer: Self-pay | Admitting: Family Medicine

## 2014-08-07 VITALS — BP 116/69 | HR 69 | Ht 64.5 in | Wt 290.0 lb

## 2014-08-07 DIAGNOSIS — L723 Sebaceous cyst: Secondary | ICD-10-CM

## 2014-08-07 DIAGNOSIS — L089 Local infection of the skin and subcutaneous tissue, unspecified: Secondary | ICD-10-CM

## 2014-08-07 MED ORDER — SULFAMETHOXAZOLE-TRIMETHOPRIM 800-160 MG PO TABS: 1.0000 | ORAL_TABLET | Freq: Two times a day (BID) | ORAL | Status: DC

## 2014-08-07 NOTE — Addendum Note (Signed)
Addended by: Beatrice Lecher D on: 08/07/2014 11:42 AM   Modules accepted: Level of Service

## 2014-08-07 NOTE — Addendum Note (Signed)
Addended by: Teddy Spike on: 08/07/2014 12:25 PM   Modules accepted: Orders

## 2014-08-07 NOTE — Progress Notes (Signed)
   Subjective:    Patient ID: Laura [name], female    DOB: 1971-06-23, 43 y.o.   MRN: [id number]  HPI  Lump near R breast at bra strap she reports that on saturday it had gotten bigger and is now red and warm. Says noticed it today.  No fever, chills or sweats. She is scheduled for bariatric surgery in 3 weeks. Starting her liver shrinking diet today.    Review of Systems     Objective:   Physical Exam  Constitutional: She appears well-developed and well-nourished.  HENT:  Head: Normocephalic and atraumatic.  Abdominal:    Skin: Skin is warm and dry.  Psychiatric: She has a normal mood and affect. Her behavior is normal.          Assessment & Plan:  infected sebaceous cyst vs abscess - recommended I& D today.    Incision and Drainage Procedure Note  Pre-operative Diagnosis: infected seb cyst  Post-operative Diagnosis: same  Indications: pain, redness, swelling   Anesthesia: 1% lidocaine with epinephrine  Procedure Details  The procedure, risks and complications have been discussed in detail (including, but not limited to airway compromise, infection, bleeding) with the patient, and the patient has signed consent to the procedure.  The skin was sterilely prepped and draped over the affected area in the usual fashion. After adequate local anesthesia, I&D with a #11 blade was performed on the right upper abdomen in the epigastric area. Purulent drainage and sebum: present The patient was observed until stable.  Findings: Culture obtained.   EBL: 1 cc's  Drains: gauze placed.    Condition: Tolerated procedure well   Complications: none.

## 2014-08-10 LAB — WOUND CULTURE
Gram Stain: NONE SEEN
Gram Stain: NONE SEEN
ORGANISM ID, BACTERIA: NO GROWTH

## 2014-10-06 DIAGNOSIS — Z903 Acquired absence of stomach [part of]: Secondary | ICD-10-CM | POA: Insufficient documentation

## 2014-11-14 ENCOUNTER — Ambulatory Visit (INDEPENDENT_AMBULATORY_CARE_PROVIDER_SITE_OTHER): Payer: Managed Care, Other (non HMO) | Admitting: Family Medicine

## 2014-11-14 ENCOUNTER — Encounter: Payer: Self-pay | Admitting: Family Medicine

## 2014-11-14 VITALS — BP 114/67 | HR 67 | Temp 97.5°F | Wt 250.0 lb

## 2014-11-14 DIAGNOSIS — R05 Cough: Secondary | ICD-10-CM

## 2014-11-14 DIAGNOSIS — J329 Chronic sinusitis, unspecified: Secondary | ICD-10-CM | POA: Diagnosis not present

## 2014-11-14 DIAGNOSIS — B9689 Other specified bacterial agents as the cause of diseases classified elsewhere: Secondary | ICD-10-CM

## 2014-11-14 DIAGNOSIS — A499 Bacterial infection, unspecified: Secondary | ICD-10-CM

## 2014-11-14 DIAGNOSIS — R059 Cough, unspecified: Secondary | ICD-10-CM

## 2014-11-14 MED ORDER — BENZONATATE 100 MG PO CAPS: 100.0000 mg | ORAL_CAPSULE | Freq: Two times a day (BID) | ORAL | Status: DC | PRN

## 2014-11-14 MED ORDER — DOXYCYCLINE HYCLATE 100 MG PO TABS: ORAL_TABLET | ORAL | Status: AC

## 2014-11-14 NOTE — Progress Notes (Signed)
CC: Laura [name] is a 44 y.o. female is here for Cough   Subjective: HPI:  Complains of facial pressure behind the eyes and beneath the eyes with nasal congestion and postnasal drip. This began a little over a week ago and at rest into a cough over the last 5 days. Cough is nonproductive worse at night when lying down. Worse the more she talks. No benefit from over-the-counter antihistamines or cough and cold medication. Symptoms are moderate in severity and interfering with quality of life. She had subjective fevers and chills yesterday. Denies wheezing, blood in sputum, chest pain, confusion or rash.   Review Of Systems Outlined In HPI  Past Medical History  Diagnosis Date  . 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 Polycystic ovary disease   . Allergic rhinitis   . Dysmenorrhea   . Dyslipidemia   . Obesity   . Gall bladder disease   . Infertility, female     Past Surgical History  Procedure Laterality Date  . Laparoscopic cholecystectomy    . Transthoracic echcardiogram  09/15/06  . Supraventricular tachycardia ablation N/A 07/23/2011    Procedure: SUPRAVENTRICULAR TACHYCARDIA ABLATION;  Surgeon: Evans Lance, MD;  [facility] C[facility] LAB;  Service: Cardiovascular;  Laterality: N/A;   Family History  Problem Relation Age of Onset  . Thyroid disease Brother   . Thyroid disease Sister   . Hypertension Father   . Colon cancer Mother 39  . Breast cancer Mother   . Diabetes Mother   . Heart attack Mother     AMI, in late 34's  . Depression Mother   . Heart disease Mother     History   Social History  . Marital Status: Married    Spouse Name: matthew  . Number of Children: 5  . Years of Education: N/A   Occupational History  . 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 Smoking status: Former Smoker    Quit date: 07/15/2002  . Smokeless tobacco: Not on file  . Alcohol Use: No  . Drug Use: No  . Sexual Activity:    Partners: Male    Other Topics Concern  . Not on file   Social History Narrative   Some exercise.  In bariatric program at [facility].      Objective: BP 114/67 mmHg  Pulse 67  Temp(Src) 97.5 F (36.4 C) (Oral)  Wt 250 lb (113.399 kg)  SpO2 96%  General: Alert and Oriented, No Acute Distress HEENT: Pupils equal, round, reactive to light. Conjunctivae clear.  External ears unremarkable, canals clear with intact TMs with appropriate landmarks.  Middle ear appears open without effusion. Pink inferior turbinates.  Moist mucous membranes, pharynx without inflammation nor lesions other than postnasal drip.  Neck supple without palpable lymphadenopathy nor abnormal masses. Lungs: Clear to auscultation bilaterally, no wheezing/ronchi/rales.  Comfortable work of breathing. Good air movement.frequent coughing Extremities: No peripheral edema.  Strong peripheral pulses.  Mental Status: No depression, anxiety, nor agitation. Skin: Warm and dry.  Assessment & Plan: Laura [name] was seen today for cough.  Diagnoses and all orders for this visit:  Bacterial sinusitis Orders: -     doxycycline (VIBRA-TABS) 100 MG tablet; One by mouth twice a day for ten days. -     benzonatate (TESSALON) 100 MG capsule; Take 1 capsule (100 mg total) by mouth 2 (two) times daily as needed for cough.  Cough Orders: -     doxycycline (VIBRA-TABS)  100 MG tablet; One by mouth twice a day for ten days. -     benzonatate (TESSALON) 100 MG capsule; Take 1 capsule (100 mg total) by mouth 2 (two) times daily as needed for cough.   Bacterial sinusitis: Start doxycycline consider nasal saline washes and Tessalon Perles for cough that is caused by postnasal drip.call if Tessalon Perles are ineffective, she would like to avoid any sedating cough medication  No Follow-up on file.

## 2014-11-25 ENCOUNTER — Other Ambulatory Visit: Payer: Self-pay | Admitting: Family Medicine

## 2014-11-27 NOTE — Telephone Encounter (Signed)
appt needed before future refills

## 2014-12-20 ENCOUNTER — Telehealth: Payer: Self-pay | Admitting: Family Medicine

## 2014-12-20 NOTE — Telephone Encounter (Signed)
Received fax for refill on Escitalopram 10mg , attempted to call Pt to schedule required f/u. No answer, left voicemail and callback information.

## 2015-02-06 DIAGNOSIS — Z9884 Bariatric surgery status: Secondary | ICD-10-CM | POA: Insufficient documentation

## 2015-02-15 ENCOUNTER — Ambulatory Visit (INDEPENDENT_AMBULATORY_CARE_PROVIDER_SITE_OTHER): Payer: Managed Care, Other (non HMO)

## 2015-02-15 ENCOUNTER — Encounter: Payer: Self-pay | Admitting: Sports Medicine

## 2015-02-15 ENCOUNTER — Ambulatory Visit (INDEPENDENT_AMBULATORY_CARE_PROVIDER_SITE_OTHER): Payer: Managed Care, Other (non HMO) | Admitting: Sports Medicine

## 2015-02-15 VITALS — BP 85/53 | HR 65 | Ht 65.0 in | Wt 230.0 lb

## 2015-02-15 DIAGNOSIS — M7061 Trochanteric bursitis, right hip: Secondary | ICD-10-CM | POA: Insufficient documentation

## 2015-02-15 DIAGNOSIS — M1732 Unilateral post-traumatic osteoarthritis, left knee: Secondary | ICD-10-CM

## 2015-02-15 DIAGNOSIS — M25562 Pain in left knee: Secondary | ICD-10-CM

## 2015-02-15 DIAGNOSIS — M7062 Trochanteric bursitis, left hip: Secondary | ICD-10-CM | POA: Diagnosis not present

## 2015-02-15 DIAGNOSIS — M25561 Pain in right knee: Secondary | ICD-10-CM

## 2015-02-15 NOTE — Assessment & Plan Note (Signed)
Formal physical therapy to work extensively on hip abductor's, return in 4 weeks, bilateral injections if no better.

## 2015-02-15 NOTE — Assessment & Plan Note (Signed)
Unable to use NSAIDs due to gastric sleeve surgery.  Tylenol has been ineffective. Left knee injection, formal physical therapy, x-rays, return to see me for custom orthotics.

## 2015-02-15 NOTE — Progress Notes (Signed)
   Subjective:    I'm seeing this patient as a consultation for:  Dr. Madilyn Fireman  CC: left knee pain  HPI: This is a pleasant 44 year old female, she has a history of a spiral tibial fracture that seems to have extended into the joint, she tells me she had to have the joint tapped in the past when she was a teenager. Now she has pain that is moderate, persistent, localized under the kneecap and at the posterior medial joint line, with gelling and stiffness in the mornings, she is post-gastric sleeve surgery and is unable to take oral NSAIDs.  She also asked pain that she localizes on the lateral aspect of both hips, moderate, persistent without radiation, difficult to lay and sleep on the ipsilateral side.  Past medical history, Surgical history, Family history not pertinant except as noted below, Social history, Allergies, and medications have been entered into the medical record, reviewed, and no changes needed.   Review of Systems: No headache, visual changes, nausea, vomiting, diarrhea, constipation, dizziness, abdominal pain, skin rash, fevers, chills, night sweats, weight loss, swollen lymph nodes, body aches, joint swelling, muscle aches, chest pain, shortness of breath, mood changes, visual or auditory hallucinations.   Objective:   General: Well Developed, well nourished, and in no acute distress.  Neuro/Psych: Alert and oriented x3, extra-ocular muscles intact, able to move all 4 extremities, sensation grossly intact. Skin: Warm and dry, no rashes noted.  Respiratory: Not using accessory muscles, speaking in full sentences, trachea midline.  Cardiovascular: Pulses palpable, no extremity edema. Abdomen: Does not appear distended. Left Knee: Minimal palpable effusion, tender to palpation at the joint lines and under the medial and lateral patellar facets ROM normal in flexion and extension and lower leg rotation. Ligaments with solid consistent endpoints including ACL, PCL, LCL,  MCL. Negative Mcmurray's and provocative meniscal tests. Non painful patellar compression. Patellar and quadriceps tendons unremarkable. Hamstring and quadriceps strength is normal. Bilateral hips: ROM IR: 60 Deg, ER: 60 Deg, Flexion: 120 Deg, Extension: 100 Deg, Abduction: 45 Deg, Adduction: 45 Deg Strength IR: 5/5, ER: 5/5, Flexion: 5/5, Extension: 5/5, Abduction: 5/5, Adduction: 5/5 Pelvic alignment unremarkable to inspection and palpation. Standing hip rotation and gait without trendelenburg / unsteadiness. Greater trochanter with tenderness bilaterally No tenderness over piriformis. No SI joint tenderness and normal minimal SI movement.  Procedure: Real-time Ultrasound Guided Injection of left knee Device: GE Logiq E  Verbal informed consent obtained.  Time-out conducted.  Noted no overlying erythema, induration, or other signs of local infection.  Skin prepped in a sterile fashion.  Local anesthesia: Topical Ethyl chloride.  With sterile technique and under real time ultrasound guidance:  2 mL kenalog 40, 4 mL lidocaine injected easily. Completed without difficulty  Pain immediately resolved suggesting accurate placement of the medication.  Advised to call if fevers/chills, erythema, induration, drainage, or persistent bleeding.  Images permanently stored and available for review in the ultrasound unit.  Impression: Technically successful ultrasound guided injection.  X-ray show bicompartmental osteoarthritis.  Impression and Recommendations:   This case required medical decision making of moderate complexity.

## 2015-02-22 ENCOUNTER — Ambulatory Visit: Payer: Managed Care, Other (non HMO) | Admitting: Sports Medicine

## 2015-03-12 ENCOUNTER — Encounter: Payer: Self-pay | Admitting: Sports Medicine

## 2015-03-12 ENCOUNTER — Ambulatory Visit (INDEPENDENT_AMBULATORY_CARE_PROVIDER_SITE_OTHER): Payer: Managed Care, Other (non HMO) | Admitting: Sports Medicine

## 2015-03-12 VITALS — BP 88/53 | HR 66 | Ht 65.0 in | Wt 229.0 lb

## 2015-03-12 DIAGNOSIS — M1732 Unilateral post-traumatic osteoarthritis, left knee: Secondary | ICD-10-CM

## 2015-03-12 NOTE — Progress Notes (Signed)

## 2015-03-12 NOTE — Assessment & Plan Note (Signed)
Excellent response to injection, custom orthotics as above.

## 2015-03-20 ENCOUNTER — Encounter: Payer: Self-pay | Admitting: Physical Therapy

## 2015-03-20 ENCOUNTER — Encounter (INDEPENDENT_AMBULATORY_CARE_PROVIDER_SITE_OTHER): Payer: Self-pay

## 2015-03-20 ENCOUNTER — Ambulatory Visit (INDEPENDENT_AMBULATORY_CARE_PROVIDER_SITE_OTHER): Payer: Managed Care, Other (non HMO) | Admitting: Physical Therapy

## 2015-03-20 DIAGNOSIS — R262 Difficulty in walking, not elsewhere classified: Secondary | ICD-10-CM | POA: Diagnosis not present

## 2015-03-20 DIAGNOSIS — M25562 Pain in left knee: Secondary | ICD-10-CM | POA: Diagnosis not present

## 2015-03-20 DIAGNOSIS — R531 Weakness: Secondary | ICD-10-CM

## 2015-03-20 NOTE — Patient Instructions (Signed)
KNEE: Quadriceps - Prone   [place] [phone]   Place strap around ankle. Bring ankle toward buttocks. Press hip into surface. Hold _30__ seconds. _1__ reps per set, _1__ sets per day, _7__ days per week  Outer Hip Stretch: Reclined IT Band Stretch (Strap)   Strap around opposite foot, pull across only as far as possible with shoulders on mat. Have arms do the work and the leg relax as much as possible Hold for __30__ secs. Repeat _2__ times each leg.  Straight Leg Raise: With External Leg Rotation   Lie on back with right leg straight, opposite leg bent. Rotate straight leg out and lift about half way up to the opposite knee. Repeat _10___ times per set. Do __3__ sets per session. Do __1__ sessions per day.  Strengthening: Hip Abduction (Side-Lying)   Tighten muscles on front of left thigh, then lift leg _10-12___ inches from surface, keeping knee locked.  Repeat _10__ times per set. Do _3___ sets per session. Do _1__ sessions per day. Repeat on the other leg.   Copyright  VHI. All rights reserved.

## 2015-03-20 NOTE — Therapy (Signed)
[place], [place], [address] Phone: [phone]   Fax:  [phone]  Physical Therapy Evaluation  Patient Details  Name: Laura [name] MRN: [id number] Date of Birth: 23-Jan-1971 Referring Provider:  Silverio Decamp,*  Encounter Date: 03/20/2015      PT End of Session - 03/20/15 1156    Visit Number 1   Number of Visits 6   Date for PT Re-Evaluation 05/01/15   PT Start Time 1109   PT Stop Time 1205   PT Time Calculation (min) 56 min      Past Medical History  Diagnosis Date  . 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 Polycystic ovary disease   . Allergic rhinitis   . Dysmenorrhea   . Dyslipidemia   . Obesity   . Gall bladder disease   . Infertility, female     Past Surgical History  Procedure Laterality Date  . Laparoscopic cholecystectomy    . Transthoracic echcardiogram  09/15/06  . Supraventricular tachycardia ablation N/A 07/23/2011    Procedure: SUPRAVENTRICULAR TACHYCARDIA ABLATION;  Surgeon: Evans Lance, MD;  Location[facility] CATH LAB;  Service: Cardiovascular;  Laterality: N/A;  . Sleeve gastroplasty      There were no vitals filed for this visit.  Visit Diagnosis:  Pain in knee joint, left - Plan: PT plan of care cert/re-cert  Weakness generalized - Plan: PT plan of care cert/re-cert  Difficulty walking - Plan: PT plan of care cert/re-cert      Subjective Assessment - 03/20/15 1115    Subjective Pt reports the Lt knee was aggrivated the end of April when she tripped and fell on her knee at home at the gate for her puppies. Had arm and Lt knee pain , the knee got progressively worse with swelling.. Had an injection and this helped   Pertinent History Lt patellar fx and tib/fib spiral fx when 45 yo, was casted for 6 mos and had PT after. 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 . She also had a Rt pelvic fx during labor and delivery 18 yrs ago.  Had PT for this also and still has some soreness there. Pt had multiple birth 64yrs later and this aggrivated a lot of her hip symtoms.    Diagnostic tests x-ray showed arthritis    Patient Stated Goals return to gym, walk dogs, recumbent bike   Currently in Pain? Yes   Pain Score 4    Pain Location Knee   Pain Orientation Left;Medial   Pain Descriptors / Indicators Aching   Pain Type Acute pain   Pain Frequency Constant   Aggravating Factors  being still, sleeping   Pain Relieving Factors moving works the pain out some, can only sleep comfortably on her belly   Multiple Pain Sites --  Rt hip pain is only on initial movement            [facility] PT Assessment - 03/20/15 0001    Assessment   Medical Diagnosis post traumatic OA Lt knee   Onset Date/Surgical Date 11/18/14   Hand Dominance Right   Next MD Visit 04/09/15   Prior Therapy none   Precautions   Precautions None   Balance Screen   Has the patient fallen in the past 6 months Yes   How many times? 1  at time of injury   Has the patient had a decrease in activity level because of  a fear of falling?  No   Is the patient reluctant to leave their home because of a fear of falling?  No   Home Environment   Living Environment Private residence   Additional Comments has some difficulty on steps if she initially gets up, in AM will sometimes take one step at a time   Prior Function   Level of Independence Independent   Vocation Full time employment   Sales executive, works from home.    Leisure take vacation, wishes to return to a gym program. Has a membership at [employer] and she is fearful because of the knee pain.    Observation/Other Assessments   Focus on Therapeutic Outcomes (FOTO)  45% limited   Posture/Postural Control   Posture/Postural Control Postural limitations   Postural Limitations --  extra abdominal girth   Posture Comments wearing orthotics   ROM / Strength   AROM / PROM / Strength  AROM;Strength   AROM   Overall AROM Comments LE WNL   Strength   Overall Strength Comments good contraction of multifidis, poor TA as pt had long incision when she had her quadruplets.    Strength Assessment Site Knee;Hip   Right/Left Hip Right;Left   Right Hip Flexion 4+/5   Right Hip Extension --  5-/5   Right Hip ABduction 4+/5   Left Hip Flexion --  5-/5   Left Hip Extension --  5-/5   Left Hip ABduction 4/5   Right/Left Knee Right;Left   Right Knee Flexion 5/5   Right Knee Extension 5/5   Left Knee Flexion 4+/5   Left Knee Extension 4/5   Flexibility   Soft Tissue Assessment /Muscle Length yes   Hamstrings WNL   Quadriceps tight bilat with prone stretch Rt > Lt   Palpation   Palpation comment tender in Lt vastus lateralis distally. Rt lateral hip and gluts                   [facility] Adult PT Treatment/Exercise - 03/20/15 0001    Exercises   Exercises Knee/Hip   Knee/Hip Exercises: Stretches   Quad Stretch Both;30 seconds  prone with strap   ITB Stretch Both;2 reps;30 seconds  with strap, cross body, supine   Knee/Hip Exercises: Supine   Straight Leg Raise with External Rotation Strengthening;Both;3 sets;10 reps  VC for form   Knee/Hip Exercises: Sidelying   Hip ABduction Strengthening;Both;3 sets;10 reps  VC for form                PT Education - 03/20/15 1155    Education provided Yes   Education Details HEP   Person(s) Educated Patient   Methods Explanation;Handout   Comprehension Verbalized understanding;Returned demonstration             PT Long Term Goals - 03/20/15 1234    PT LONG TERM GOAL #1   Title I with advance HEP to include the gym ( 05/01/15)   Time 6   Period Weeks   Status New   PT LONG TERM GOAL #2   Title increase strength bilat hips =/> 5-/5 ( 05/01/15)   Time 6   Period Weeks   Status New   PT LONG TERM GOAL #3   Title improve prone quad flexibility to allow ankle to touch buttocks ( 05/01/15)    Time 6    Period Weeks   Status New   PT LONG TERM GOAL #4   Title report =/> 50%  reduction in pain/feelings of stiffness in Lt knee (05/01/15)   Time 6   Period Weeks   Status New   PT LONG TERM GOAL #5   Title improve FOTO =/< 36% limited ( 05/01/15)    Time 6   Period Weeks   Status New               Plan - 03/20/15 1223    Clinical Impression Statement 44 yo female presents with Lt knee OA/pain.  She also has had multiple other factors that may be contributing to her knee issues. H/o Lt patellar and tib/fib fx, rt pelvis fx and the fall in April.  She has lower body weakness and tight quads. Pt requests once a week visits due to financial concerns   Pt will benefit from skilled therapeutic intervention in order to improve on the following deficits Decreased strength;Pain;Obesity   Rehab Potential Good   PT Frequency 1x / week  for 3wks then every other if patient needs   PT Duration 6 weeks   PT Treatment/Interventions Moist Heat;Ultrasound;Therapeutic exercise;Vasopneumatic Device;Manual techniques;Neuromuscular re-education;Cryotherapy;Glass blower/designer   PT Next Visit Plan progress strengthening   Consulted and Agree with Plan of Care Patient         Problem List Patient Active Problem List   Diagnosis Date Noted  . Post-traumatic osteoarthritis of left knee 02/15/2015  . Trochanteric bursitis of both hips 02/15/2015  . History of bariatric surgery 02/06/2015  . GERD (gastroesophageal reflux disease) 06/19/2014  . OSA on CPAP 06/19/2014  . Leg swelling 05/18/2012  . Sebaceous hyperplasia 10/24/2011  . BACK PAIN, THORACIC REGION 07/25/2009  . 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/19/2006  . OBESITY, NOS 05/19/2006  . DEPRESSION, MAJOR, RECURRENT 05/19/2006  . 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    Jeral Pinch PT 03/20/2015, 12:40 PM  [facility] [place], [place],  [address] Phone: [phone]   Fax:  [phone]

## 2015-03-28 ENCOUNTER — Ambulatory Visit (INDEPENDENT_AMBULATORY_CARE_PROVIDER_SITE_OTHER): Payer: Managed Care, Other (non HMO) | Admitting: Physical Therapy

## 2015-03-28 DIAGNOSIS — R531 Weakness: Secondary | ICD-10-CM | POA: Diagnosis not present

## 2015-03-28 DIAGNOSIS — R262 Difficulty in walking, not elsewhere classified: Secondary | ICD-10-CM | POA: Diagnosis not present

## 2015-03-28 DIAGNOSIS — M25562 Pain in left knee: Secondary | ICD-10-CM

## 2015-03-28 NOTE — Therapy (Signed)
[place], [place], [address] Phone: [phone]   Fax:  [phone]  Physical Therapy Treatment  Patient Details  Name: Laura [name] MRN: [id number] Date of Birth: 09-Mar-1971 Referring Provider:  Silverio Decamp,*  Encounter Date: 03/28/2015      PT End of Session - 03/28/15 1409    Visit Number 2   Number of Visits 6   Date for PT Re-Evaluation 05/01/15   PT Start Time 0131   PT Stop Time 1445   PT Time Calculation (min) 40 min      Past Medical History  Diagnosis Date  . 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 Polycystic ovary disease   . Allergic rhinitis   . Dysmenorrhea   . Dyslipidemia   . Obesity   . Gall bladder disease   . Infertility, female     Past Surgical History  Procedure Laterality Date  . Laparoscopic cholecystectomy    . Transthoracic echcardiogram  09/15/06  . Supraventricular tachycardia ablation N/A 07/23/2011    Procedure: SUPRAVENTRICULAR TACHYCARDIA ABLATION;  Surgeon: Evans Lance, MD;  Location: [facility] CATH LAB;  Service: Cardiovascular;  Laterality: N/A;  . Sleeve gastroplasty      There were no vitals filed for this visit.  Visit Diagnosis:  Pain in knee joint, left  Weakness generalized  Difficulty walking      Subjective Assessment - 03/28/15 1405    Subjective Pt reports she fell two days after her last visit after sliding on ice in the kitchen.  Iced  and rest to help. She is able to perform her HEP especially likes the prone quad stretch and ITB stretch with strap.    Currently in Pain? No/denies  has pain with transitioning to a stand from sit.  Knee only hurts when it is touched in the bruised area                         [facility] Adult PT Treatment/Exercise - 03/28/15 0001    Exercises   Exercises Knee/Hip   Knee/Hip Exercises: Stretches   ITB Stretch Both;2 reps;30 seconds   Other Knee/Hip Stretches soft  roller on outside of each hip   Knee/Hip Exercises: Aerobic   Nustep L3x6'   Knee/Hip Exercises: Machines for Strengthening   Cybex Leg Press seat 5, 5 plates, 3x10   Knee/Hip Exercises: Standing   Heel Raises Both;2 sets;10 reps  off edge of step.    SLS 10 reps toe taps FWD/side/BWD each side   Knee/Hip Exercises: Seated   Long Arc Quad Strengthening;Both;3 sets;10 reps;Weights   Long Arc Quad Weight 5 lbs.   Knee/Hip Exercises: Supine   Bridges Strengthening;2 sets;10 reps   Other Supine Knee/Hip Exercises iosmetric hip flexion in hooklying for abdominal work   Knee/Hip Exercises: Sidelying   Clams 3x8 with green band around knees each side                     PT Long Term Goals - 03/28/15 1411    PT LONG TERM GOAL #1   Title I with advance HEP to include the gym ( 05/01/15)   Status On-going   PT LONG TERM GOAL #2   Title increase strength bilat hips =/> 5-/5 ( 05/01/15)   Status On-going   PT LONG TERM GOAL #3   Title improve prone quad flexibility to allow ankle to touch buttocks (  05/01/15)    Status On-going   PT LONG TERM GOAL #4   Title report =/> 50% reduction in pain/feelings of stiffness in Lt knee (05/01/15)   Status On-going   PT LONG TERM GOAL #5   Title improve FOTO =/< 36% limited ( 05/01/15)    Status On-going               Plan - 03/28/15 1440    Clinical Impression Statement This is patients second visit, no goals have been met.  She is tolerating her initial HEP well.  Slight set back in her knee pain due to falling two days after evaluation.     Pt will benefit from skilled therapeutic intervention in order to improve on the following deficits Decreased strength;Pain;Obesity   Rehab Potential Good   PT Frequency 1x / week   PT Duration 6 weeks   PT Treatment/Interventions Moist Heat;Ultrasound;Therapeutic exercise;Vasopneumatic Device;Manual techniques;Neuromuscular re-education;Cryotherapy;Glass blower/designer   PT  Next Visit Plan progress strengthening   Consulted and Agree with Plan of Care Patient        Problem List Patient Active Problem List   Diagnosis Date Noted  . Post-traumatic osteoarthritis of left knee 02/15/2015  . Trochanteric bursitis of both hips 02/15/2015  . History of bariatric surgery 02/06/2015  . GERD (gastroesophageal reflux disease) 06/19/2014  . OSA on CPAP 06/19/2014  . Leg swelling 05/18/2012  . Sebaceous hyperplasia 10/24/2011  . BACK PAIN, THORACIC REGION 07/25/2009  . 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/19/2006  . OBESITY, NOS 05/19/2006  . DEPRESSION, MAJOR, RECURRENT 05/19/2006  . 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    Jeral Pinch PT 03/28/2015, 2:49 PM  [facility] [place], [place], [address] Phone: [phone]   Fax:  [phone]

## 2015-04-04 ENCOUNTER — Ambulatory Visit (INDEPENDENT_AMBULATORY_CARE_PROVIDER_SITE_OTHER): Payer: Managed Care, Other (non HMO) | Admitting: Rehabilitative and Restorative Service Providers"

## 2015-04-04 ENCOUNTER — Encounter: Payer: Self-pay | Admitting: Rehabilitative and Restorative Service Providers"

## 2015-04-04 DIAGNOSIS — R262 Difficulty in walking, not elsewhere classified: Secondary | ICD-10-CM | POA: Diagnosis not present

## 2015-04-04 DIAGNOSIS — M25562 Pain in left knee: Secondary | ICD-10-CM

## 2015-04-04 DIAGNOSIS — R531 Weakness: Secondary | ICD-10-CM | POA: Diagnosis not present

## 2015-04-04 NOTE — Therapy (Signed)
[place], [place], [address] Phone: [phone]   Fax:  [phone]  Physical Therapy Treatment  Patient Details  Name: Laura [name] MRN: [id number] Date of Birth: 07/22/1971 Referring Provider:  Silverio Decamp,*  Encounter Date: 04/04/2015      PT End of Session - 04/04/15 1409    Visit Number 3   Number of Visits 6   Date for PT Re-Evaluation 05/01/15   PT Start Time 0208   PT Stop Time 0240   PT Time Calculation (min) 32 min   Activity Tolerance Patient tolerated treatment well      Past Medical History  Diagnosis Date  . 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 Polycystic ovary disease   . Allergic rhinitis   . Dysmenorrhea   . Dyslipidemia   . Obesity   . Gall bladder disease   . Infertility, female     Past Surgical History  Procedure Laterality Date  . Laparoscopic cholecystectomy    . Transthoracic echcardiogram  09/15/06  . Supraventricular tachycardia ablation N/A 07/23/2011    Procedure: SUPRAVENTRICULAR TACHYCARDIA ABLATION;  Surgeon: Evans Lance, MD;  Location: [facility] CATH LAB;  Service: Cardiovascular;  Laterality: N/A;  . Sleeve gastroplasty      There were no vitals filed for this visit.  Visit Diagnosis:  Pain in knee joint, left  Weakness generalized  Difficulty walking      Subjective Assessment - 04/04/15 1410    Subjective Patinet reports that she is doing her exercises at home and generally pain is less. Having pain in the anterior Lt thigh with no known cause. 2/10 nagging type pain.    Currently in Pain? No/denies            [facility] PT Assessment - 04/04/15 0001    Strength   Left Hip Flexion --  5-/5   Left Hip ABduction 4+/5   Left Knee Flexion 4+/5   Left Knee Extension 4+/5                     [facility] Adult PT Treatment/Exercise - 04/04/15 0001    Self-Care   Self-Care --  roller/ball for myofacial release quad  1-2 min   Exercises   Exercises Knee/Hip   Knee/Hip Exercises: Stretches   Passive Hamstring Stretch 2 reps;30 seconds   Quad Stretch 2 reps;30 seconds   ITB Stretch Both;2 reps;30 seconds   Knee/Hip Exercises: Aerobic   Nustep L3x5'   Knee/Hip Exercises: Machines for Strengthening   Cybex Leg Press seat 5, 5 plates, 3x10   Knee/Hip Exercises: Standing   SLS SLS with foam roller 5-6 reps ~20 sec hold   SLS with Vectors 10 toe taps FWD/SIDE   Knee/Hip Exercises: Supine   Bridges Strengthening;2 sets;10 reps                PT Education - 04/04/15 1655    Education provided Yes   Education Details myofacial release using ball and stick, trial of wall slide increased pain ant knee - stopped   Person(s) Educated Patient   Methods Explanation;Demonstration;Verbal cues   Comprehension Verbalized understanding;Returned demonstration             PT Long Term Goals - 04/04/15 1659    PT LONG TERM GOAL #1   Title I with advance HEP to include the gym ( 05/01/15)   Time 6   Period Weeks   Status  On-going   PT LONG TERM GOAL #2   Title increase strength bilat hips =/> 5-/5 ( 05/01/15)   Baseline increasing LE strength    Time 6   Period Weeks   Status On-going   PT LONG TERM GOAL #3   Title improve prone quad flexibility to allow ankle to touch buttocks ( 05/01/15)    Time 6   Period Weeks   Status On-going   PT LONG TERM GOAL #4   Title report =/> 50% reduction in pain/feelings of stiffness in Lt knee (05/01/15)   Time 6   Period Weeks   Status On-going   PT LONG TERM GOAL #5   Title improve FOTO =/< 36% limited ( 05/01/15)    Time 6   Period Weeks   Status On-going               Plan - 04/04/15 1657    Clinical Impression Statement Patient reports that she generally has less pain in Lt knee but continues to have intermittent symptoms. She is working on [employer]. Progressing toward stated goals of therapy.    Pt will benefit from skilled therapeutic  intervention in order to improve on the following deficits Decreased strength;Pain;Obesity   Rehab Potential Good   PT Frequency 1x / week   PT Duration 6 weeks   PT Treatment/Interventions Moist Heat;Ultrasound;Therapeutic exercise;Vasopneumatic Device;Manual techniques;Neuromuscular re-education;Cryotherapy;Glass blower/designer   PT Next Visit Plan progress strengthening   PT Home Exercise Plan add myofacila release work Lt ant/lat quad   Consulted and Agree with Plan of Care Patient        Problem List Patient Active Problem List   Diagnosis Date Noted  . Post-traumatic osteoarthritis of left knee 02/15/2015  . Trochanteric bursitis of both hips 02/15/2015  . History of bariatric surgery 02/06/2015  . GERD (gastroesophageal reflux disease) 06/19/2014  . OSA on CPAP 06/19/2014  . Leg swelling 05/18/2012  . Sebaceous hyperplasia 10/24/2011  . BACK PAIN, THORACIC REGION 07/25/2009  . 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/19/2006  . OBESITY, NOS 05/19/2006  . DEPRESSION, MAJOR, RECURRENT 05/19/2006  . 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    Tomekia Helton Nilda Simmer, PT, MPH 04/04/2015, 5:01 PM  [facility] [place], [place], [address] Phone: [phone]   Fax:  [phone]

## 2015-04-09 ENCOUNTER — Encounter: Payer: Self-pay | Admitting: Sports Medicine

## 2015-04-09 ENCOUNTER — Ambulatory Visit (INDEPENDENT_AMBULATORY_CARE_PROVIDER_SITE_OTHER): Payer: Managed Care, Other (non HMO)

## 2015-04-09 ENCOUNTER — Telehealth: Payer: Self-pay | Admitting: Sports Medicine

## 2015-04-09 ENCOUNTER — Ambulatory Visit (INDEPENDENT_AMBULATORY_CARE_PROVIDER_SITE_OTHER): Payer: Managed Care, Other (non HMO) | Admitting: Sports Medicine

## 2015-04-09 VITALS — BP 104/50 | HR 57 | Ht 64.0 in | Wt 232.0 lb

## 2015-04-09 DIAGNOSIS — M1732 Unilateral post-traumatic osteoarthritis, left knee: Secondary | ICD-10-CM

## 2015-04-09 DIAGNOSIS — M25762 Osteophyte, left knee: Secondary | ICD-10-CM

## 2015-04-09 DIAGNOSIS — M25761 Osteophyte, right knee: Secondary | ICD-10-CM | POA: Diagnosis not present

## 2015-04-09 MED ORDER — TRAMADOL HCL 50 MG PO TABS: ORAL_TABLET | ORAL | Status: DC

## 2015-04-09 NOTE — Assessment & Plan Note (Addendum)
Was doing extremely well with physical therapy, orthotics, and after injection 2 months ago. Unfortunately had a fall now with pain and bruising over the tibial tuberosity. Knee brace applied, x-rays, tramadol given for pain. We are going to get her set up for viscous supplementation. Since the fall she has developed a bit of bilateral trochanteric bursitis. Physical therapy will work on this.

## 2015-04-09 NOTE — Telephone Encounter (Signed)
Submitted for approval on Orth[employer]. Awaiting confirmation.  

## 2015-04-09 NOTE — Progress Notes (Signed)
  Subjective:    CC: left knee pain  HPI: This is a pleasant 44 year old female with left knee osteoarthritis, she was doing well after an injection, orthotics, and physical therapy almost 2 months ago. Unfortunate she had a recent fall, fell on tile directly on her left knee, immediate pain, bruising, has not had x-rays, this was 2 weeks ago. Pain is localized anteriorly over the tibial tubercle. No mechanical symptoms, minimal swelling.  Past medical history, Surgical history, Family history not pertinant except as noted below, Social history, Allergies, and medications have been entered into the medical record, reviewed, and no changes needed.   Review of Systems: No fevers, chills, night sweats, weight loss, chest pain, or shortness of breath.   Objective:    General: Well Developed, well nourished, and in no acute distress.  Neuro: Alert and oriented x3, extra-ocular muscles intact, sensation grossly intact.  HEENT: Normocephalic, atraumatic, pupils equal round reactive to light, neck supple, no masses, no lymphadenopathy, thyroid nonpalpable.  Skin: Warm and dry, no rashes. Cardiac: Regular rate and rhythm, no murmurs rubs or gallops, no lower extremity edema.  Respiratory: Clear to auscultation bilaterally. Not using accessory muscles, speaking in full sentences. Left Knee: Normal to inspection. Tender to palpation directly over the tibial tubercle, only minimal pain at the joint lines, minimal effusion. ROM normal in flexion and extension and lower leg rotation. Ligaments with solid consistent endpoints including ACL, PCL, LCL, MCL. Negative Mcmurray's and provocative meniscal tests. Non painful patellar compression. Patellar and quadriceps tendons unremarkable. Hamstring and quadriceps strength is normal.  Impression and Recommendations:    I spent 25 minutes with this patient, greater than 50% was face-to-face time counseling regarding the above diagnoses

## 2015-04-09 NOTE — Telephone Encounter (Signed)
-----   Message from Silverio Decamp, MD sent at 04/09/2015 11:31 AM EDT ----- [place], [employer] Approval. XR confirmed OA, failed steroid injection. Left knee orthovisc. ___________________________________________ Gwen Her. Dianah Field, M.D., ABFM., CAQSM. Primary Care and [place] Instructor of [employer] [facility] [employer]   Preesh!

## 2015-04-11 ENCOUNTER — Other Ambulatory Visit: Payer: Self-pay | Admitting: Sports Medicine

## 2015-04-11 DIAGNOSIS — M1732 Unilateral post-traumatic osteoarthritis, left knee: Secondary | ICD-10-CM

## 2015-04-11 MED ORDER — SODIUM HYALURONATE (VISCOSUP) 25 MG/2.5ML IX SOSY: PREFILLED_SYRINGE | INTRA_ARTICULAR | Status: DC

## 2015-04-11 NOTE — Telephone Encounter (Signed)
Received benefit detail from [facility]. Pre-Cert required. Called [phone] to begin pre-cert,  Was transferred to [place] (P: [phone], F:[phone]). Spoke with Tammy regarding Pre-Auth and [facility] is not preferred, Jacklyn Shell is the preferred. Went ahead and proceeded with Auth on Sup[employer]. Auth #: [id number]. Valid: 04/11/15-06/11/15.  Will route to Dr. Dianah Field for new Rx order to be faxed to [facility] at [phone].

## 2015-04-11 NOTE — Addendum Note (Signed)
Addended by: Silverio Decamp on: 04/11/2015 12:16 PM   Modules accepted: Orders

## 2015-04-11 NOTE — Telephone Encounter (Signed)
Thanks for doing all the work!  Rx sent to [facility] for supartz.

## 2015-04-12 ENCOUNTER — Telehealth: Payer: Self-pay | Admitting: Sports Medicine

## 2015-04-12 NOTE — Telephone Encounter (Signed)
Received fax from [facility] and as long as the member remains covered by their current prescription plan they are approved for Supartz from 04/11/2015 - 06/10/2015 - CF

## 2015-04-18 ENCOUNTER — Ambulatory Visit (INDEPENDENT_AMBULATORY_CARE_PROVIDER_SITE_OTHER): Payer: Managed Care, Other (non HMO) | Admitting: Rehabilitative and Restorative Service Providers"

## 2015-04-18 ENCOUNTER — Encounter: Payer: Self-pay | Admitting: Rehabilitative and Restorative Service Providers"

## 2015-04-18 DIAGNOSIS — M25562 Pain in left knee: Secondary | ICD-10-CM

## 2015-04-18 DIAGNOSIS — R531 Weakness: Secondary | ICD-10-CM | POA: Diagnosis not present

## 2015-04-18 DIAGNOSIS — R262 Difficulty in walking, not elsewhere classified: Secondary | ICD-10-CM

## 2015-04-18 NOTE — Therapy (Addendum)
[place], [place], [address] Phone: [phone]   Fax:  [phone]  Physical Therapy Treatment  Patient Details  Name: Laura [name] MRN: [id number] Date of Birth: 04-24-1971 Referring Provider:  Silverio Decamp,*  Encounter Date: 04/18/2015      PT End of Session - 04/18/15 1519    Visit Number 4   Number of Visits 6   Date for PT Re-Evaluation 05/01/15   PT Start Time 0230   PT Stop Time 0320   PT Time Calculation (min) 50 min   Activity Tolerance Patient tolerated treatment well      Past Medical History  Diagnosis Date  . 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 Polycystic ovary disease   . Allergic rhinitis   . Dysmenorrhea   . Dyslipidemia   . Obesity   . Gall bladder disease   . Infertility, female     Past Surgical History  Procedure Laterality Date  . Laparoscopic cholecystectomy    . Transthoracic echcardiogram  09/15/06  . Supraventricular tachycardia ablation N/A 07/23/2011    Procedure: SUPRAVENTRICULAR TACHYCARDIA ABLATION;  Surgeon: Evans Lance, MD;  Location: [facility] CATH LAB;  Service: Cardiovascular;  Laterality: N/A;  . Sleeve gastroplasty      There were no vitals filed for this visit.  Visit Diagnosis:  Pain in knee joint, left  Weakness generalized  Difficulty walking      Subjective Assessment - 04/18/15 1430    Subjective patient reports that she was seen by MD for hip pain following fall. He wants her to continue with PT. He also gave her a brace for her knee which she uses at times. She reports that the brace is hot and falls down but does seem to help. Pt reports that she is having less pain and feels that she is getting stronger. She does not hav eht knee paiin in the mornings as much.   Currently in Pain? No/denies            [facility] PT Assessment - 04/18/15 0001    Strength   Right Hip Flexion --  5-/5   Right Hip Extension 5/5    Right Hip ABduction 5/5   Left Hip Flexion --  5-/5   Left Hip Extension --  5-/5   Left Hip ABduction --  5-/5   Left Knee Flexion 4+/5   Left Knee Extension 4+/5                     [facility] Adult PT Treatment/Exercise - 04/18/15 0001    Exercises   Exercises Knee/Hip   Knee/Hip Exercises: Stretches   Passive Hamstring Stretch 2 reps;30 seconds   Quad Stretch 2 reps;30 seconds   ITB Stretch Both;2 reps;30 seconds   Knee/Hip Exercises: Aerobic   Nustep L4x5'   Knee/Hip Exercises: Machines for Strengthening   Cybex Leg Press seat 5,6 plates, 3x10   Knee/Hip Exercises: Standing   SLS SLS with foam roller 5-6 reps ~20 sec hold   SLS with Vectors 10 toe taps FWD/SIDE   Other Standing Knee Exercises heel touch 4 inch step 10 x2 sets   Knee/Hip Exercises: Seated   Long Arc Quad --  short arc quad 10 x 2 sets    Sit to [employer] 10 reps;5 reps;without UE support   Knee/Hip Exercises: Supine   Short Arc Quad Sets Strengthening;Left;2 sets;10 reps   [facility] sets;10 reps  Knee/Hip Exercises: Prone   Hip Extension Strengthening;Both;2 sets;10 reps                PT Education - 04/18/15 1518    Education provided Yes   Education Details core stabilization with HEP; added exercises   Person(s) Educated Patient   Methods Explanation;Demonstration;Tactile cues;Verbal cues;Handout   Comprehension Verbalized understanding;Returned demonstration;Verbal cues required;Tactile cues required             PT Long Term Goals - 04/04/15 1659    PT LONG TERM GOAL #1   Title I with advance HEP to include the gym ( 05/01/15)   Time 6   Period Weeks   Status On-going   PT LONG TERM GOAL #2   Title increase strength bilat hips =/> 5-/5 ( 05/01/15)   Baseline increasing LE strength    Time 6   Period Weeks   Status On-going   PT LONG TERM GOAL #3   Title improve prone quad flexibility to allow ankle to touch buttocks ( 05/01/15)    Time 6   Period Weeks    Status On-going   PT LONG TERM GOAL #4   Title report =/> 50% reduction in pain/feelings of stiffness in Lt knee (05/01/15)   Time 6   Period Weeks   Status On-going   PT LONG TERM GOAL #5   Title improve FOTO =/< 36% limited ( 05/01/15)    Time 6   Period Weeks   Status On-going               Plan - 04/18/15 1519    Clinical Impression Statement Decreased pain; incresaed strength; improveing functional activity level with less limitation. Progressing well. Now working in the gym as well as at home.   Rehab Potential Good   PT Frequency 1x / week   PT Duration 6 weeks   PT Treatment/Interventions Moist Heat;Ultrasound;Therapeutic exercise;Vasopneumatic Device;Manual techniques;Neuromuscular re-education;Cryotherapy;Glass blower/designer   PT Next Visit Plan progress strengthening   PT Home Exercise Plan add myofacila release work Lt ant/lat quad   Consulted and Agree with Plan of Care Patient        Problem List Patient Active Problem List   Diagnosis Date Noted  . Post-traumatic osteoarthritis of left knee 02/15/2015  . Trochanteric bursitis of both hips 02/15/2015  . History of bariatric surgery 02/06/2015  . GERD (gastroesophageal reflux disease) 06/19/2014  . OSA on CPAP 06/19/2014  . Leg swelling 05/18/2012  . Sebaceous hyperplasia 10/24/2011  . BACK PAIN, THORACIC REGION 07/25/2009  . 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/19/2006  . OBESITY, NOS 05/19/2006  . DEPRESSION, MAJOR, RECURRENT 05/19/2006  . 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    Margo Lama Nilda Simmer PT, MPH 04/18/2015, 4:53 PM  [facility] [place], [place], [address] Phone: [phone]   Fax:  [phone]     PHYSICAL THERAPY DISCHARGE SUMMARY  Visits from Start of Care: 4  Current functional level related to goals / functional outcomes: Progressing well toward stated goals of therapy. She was scheduled  to continue PT to progress with strengthening but failed to return for further treatment.   Remaining deficits:  weakness    Education / Equipment: HEP  Plan: Patient agrees to discharge.  Patient goals were partially met. Patient is being discharged due to not returning since the last visit.  ?????   Kieren Adkison P. Helene Kelp PT, MPH 06/06/2015 10:34 AM

## 2015-04-18 NOTE — Patient Instructions (Signed)
Abdominal Bracing With Pelvic Floor (Hook-Lying)   With neutral spine, tighten pelvic floor and abdominals, tighten muscles in back at waist. Hold 10 sec Repeat _10__ times. Do _several__ times a day. Progress to do this exercise in sitting, standing, walking and functional activities. Engage core with exercises for hips and knees!    Strengthening: Hip Extension (Prone)   Tighten muscles on front of left thigh, then lift leg _8-10___ inches from surface, keeping knee locked. Repeat _10___ times per set. Do __1-3__ sets per session. Do _1-2___ sessions per day.   Functional Quadriceps: Sit to Stand   Sit on edge of chair, feet flat on floor. Stand upright, extending knees fully. Repeat _10___ times per set. Do ___1-2_ sets per session. Do __1-2__ sessions per day.   Short Arc Quad   With bolster under knee of sound limb, tighten muscle on top of thigh and straighten leg. Maintain contact with bolster. Hold __10__ seconds. Repeat _10__ times. Do ___1-2_ sessions per day.

## 2015-04-26 ENCOUNTER — Encounter: Payer: Self-pay | Admitting: Family Medicine

## 2015-04-26 ENCOUNTER — Ambulatory Visit (INDEPENDENT_AMBULATORY_CARE_PROVIDER_SITE_OTHER): Payer: Managed Care, Other (non HMO) | Admitting: Family Medicine

## 2015-04-26 VITALS — BP 109/66 | HR 57 | Temp 98.1°F | Wt 229.0 lb

## 2015-04-26 DIAGNOSIS — H60391 Other infective otitis externa, right ear: Secondary | ICD-10-CM

## 2015-04-26 DIAGNOSIS — H60399 Other infective otitis externa, unspecified ear: Secondary | ICD-10-CM | POA: Insufficient documentation

## 2015-04-26 MED ORDER — DEXAMETHASONE 0.1 % OP SUSP: 4.0000 [drp] | Freq: Two times a day (BID) | OPHTHALMIC | Status: DC

## 2015-04-26 MED ORDER — CIPROFLOXACIN-DEXAMETHASONE 0.3-0.1 % OT SUSP: 4.0000 [drp] | Freq: Two times a day (BID) | OTIC | Status: AC

## 2015-04-26 NOTE — Progress Notes (Signed)
Laura [name] is a 44 y.o. female who presents to [facility]  today for right ear crusting and discharge present for the last few days. This is happening in the past. She Ciprodex in the past which tends to help. No fevers chills nausea vomiting diarrhea or decreased hearing.   Past Medical History  Diagnosis Date  . 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 Polycystic ovary disease   . Allergic rhinitis   . Dysmenorrhea   . Dyslipidemia   . Obesity   . Gall bladder disease   . Infertility, female    Past Surgical History  Procedure Laterality Date  . Laparoscopic cholecystectomy    . Transthoracic echcardiogram  09/15/06  . Supraventricular tachycardia ablation N/A 07/23/2011    Procedure: SUPRAVENTRICULAR TACHYCARDIA ABLATION;  Surgeon: Evans Lance, MD;  Location: [facility] CATH LAB;  Service: Cardiovascular;  Laterality: N/A;  . Sleeve gastroplasty     Social History  Substance Use Topics  . Smoking status: Former Smoker    Quit date: 07/15/2002  . Smokeless tobacco: Not on file  . Alcohol Use: No   family history includes Breast cancer in her mother; Colon cancer (age of onset: 31) in her mother; Depression in her mother; Diabetes in her mother; Heart attack in her mother; Heart disease in her mother; Hypertension in her father; Thyroid disease in her brother and sister.  ROS as above Medications: Current Outpatient Prescriptions  Medication Sig Dispense Refill  . escitalopram (LEXAPRO) 10 MG tablet TAKE 1 TABLET BY MOUTH EVERY DAY 30 tablet 0  . fluticasone (FLONASE) 50 MCG/ACT nasal spray One spray in each nostril twice a day, use left hand for right nostril, and right hand for left nostril. 48 g 3  . hydrochlorothiazide (MICROZIDE) 12.5 MG capsule TAKE ONE CAPSULE BY MOUTH EVERY DAY AS NEEDED FOR LOWER LEG EDEMA 90 capsule 0  . metFORMIN (GLUCOPHAGE) 500 MG tablet Take 1 tablet (500 mg total) by mouth 2 (two) times daily with a meal. 180  tablet 1  . metoCLOPramide (REGLAN) 10 MG tablet Take one tab the morning of surgery    . nystatin (MYCOSTATIN) 100000 UNIT/ML suspension   1  . omeprazole (PRILOSEC) 20 MG capsule Take one tab the night before surgery then one tab twice a day    . ondansetron (ZOFRAN-ODT) 8 MG disintegrating tablet Let one tablet melt on your tongue every 6 hours as needed for nausea    . Sodium Hyaluronate (SUPARTZ) 25 MG/2.5ML SOSY Injected intra-articular weekly for 5 weeks, diagnosis: Primary osteoarthritis of the knee 5 Syringe 0  . traMADol (ULTRAM) 50 MG tablet 1-2 tabs by mouth Q8 hours, maximum 6 tabs per day. 30 tablet 0  . tretinoin (RETIN-A) 0.025 % gel Apply topically.    . Vitamin D, Ergocalciferol, (DRISDOL) 50000 UNITS CAPS capsule   0  . ciprofloxacin-dexamethasone (CIPRODEX) otic suspension Place 4 drops into the right ear 2 (two) times daily. 1 mL 3  . dexamethasone (DECADRON) 0.1 % ophthalmic suspension Place 4 drops into the right ear 2 (two) times daily. 15 mL 3   No current facility-administered medications for this visit.   Allergies  Allergen Reactions  . Neomycin   . Penicillins Hives     Exam:  BP 109/66 mmHg  Pulse 57  Temp(Src) 98.1 F (36.7 C) (Oral)  Wt 229 lb (103.874 kg) Gen: Well NAD HEENT: EOMI,  MMM right ear canal is crusted and erythematous with  discharge. Tympanic membrane is barely visible without erythema. Lungs: Normal work of breathing. CTABL Heart: RRR no MRG Abd: NABS, Soft. Nondistended, Nontender Exts: Brisk capillary refill, warm and well perfused.   No results found for this or any previous visit (from the past 24 hour(s)). No results found.   Please see individual assessment and plan sections.

## 2015-04-26 NOTE — Assessment & Plan Note (Signed)
Treat with Ciprodex and topical dexamethasone. Return as needed.

## 2015-04-26 NOTE — Patient Instructions (Signed)
Thank you for coming in today. Use the ciprodex until better.  Then just use the regular dexamethasone daily.   Return as needed.   Otitis Externa Otitis externa is a bacterial or fungal infection of the outer ear canal. This is the area from the eardrum to the outside of the ear. Otitis externa is sometimes called "swimmer's ear." CAUSES  Possible causes of infection include:  Swimming in dirty water.  Moisture remaining in the ear after swimming or bathing.  Mild injury (trauma) to the ear.  Objects stuck in the ear (foreign body).  Cuts or scrapes (abrasions) on the outside of the ear. SIGNS AND SYMPTOMS  The first symptom of infection is often itching in the ear canal. Later signs and symptoms may include swelling and redness of the ear canal, ear pain, and yellowish-white fluid (pus) coming from the ear. The ear pain may be worse when pulling on the earlobe. DIAGNOSIS  Your health care provider will perform a physical exam. A sample of fluid may be taken from the ear and examined for bacteria or fungi. TREATMENT  Antibiotic ear drops are often given for 10 to 14 days. Treatment may also include pain medicine or corticosteroids to reduce itching and swelling. HOME CARE INSTRUCTIONS   Apply antibiotic ear drops to the ear canal as prescribed by your health care provider.  Take medicines only as directed by your health care provider.  If you have diabetes, follow any additional treatment instructions from your health care provider.  Keep all follow-up visits as directed by your health care provider. PREVENTION   Keep your ear dry. Use the corner of a towel to absorb water out of the ear canal after swimming or bathing.  Avoid scratching or putting objects inside your ear. This can damage the ear canal or remove the protective wax that lines the canal. This makes it easier for bacteria and fungi to grow.  Avoid swimming in lakes, polluted water, or poorly chlorinated  pools.  You may use ear drops made of rubbing alcohol and vinegar after swimming. Combine equal parts of white vinegar and alcohol in a bottle. Put 3 or 4 drops into each ear after swimming. SEEK MEDICAL CARE IF:   You have a fever.  Your ear is still red, swollen, painful, or draining pus after 3 days.  Your redness, swelling, or pain gets worse.  You have a severe headache.  You have redness, swelling, pain, or tenderness in the area behind your ear. MAKE SURE YOU:   Understand these instructions.  Will watch your condition.  Will get help right away if you are not doing well or get worse. Document Released: 07/28/2005 Document Revised: 12/12/2013 Document Reviewed: 08/14/2011 [facility] Patient Information 2015 [place], [place]. This information is not intended to replace advice given to you by your health care provider. Make sure you discuss any questions you have with your health care provider.

## 2015-05-02 ENCOUNTER — Encounter: Payer: Self-pay | Admitting: Rehabilitative and Restorative Service Providers"

## 2015-05-30 ENCOUNTER — Ambulatory Visit (INDEPENDENT_AMBULATORY_CARE_PROVIDER_SITE_OTHER): Payer: Managed Care, Other (non HMO) | Admitting: Family Medicine

## 2015-05-30 VITALS — Temp 98.5°F

## 2015-05-30 DIAGNOSIS — Z23 Encounter for immunization: Secondary | ICD-10-CM | POA: Diagnosis not present

## 2015-07-09 NOTE — Telephone Encounter (Signed)
Spoke with [facility], they are putting Supartz in the mail. Injections should arrive on 07/12/15. Pt notified. She will call to schedule beginning next week.

## 2015-07-23 ENCOUNTER — Telehealth: Payer: Self-pay | Admitting: Sports Medicine

## 2015-07-23 ENCOUNTER — Telehealth: Payer: Self-pay | Admitting: Family Medicine

## 2015-07-23 NOTE — Telephone Encounter (Signed)
Received fax from [facility] stating as long as patient remains on current insurance plan the medication will be covered from 07/18/2015 - 09/16/2015. - CF

## 2015-07-23 NOTE — Telephone Encounter (Signed)
Error

## 2015-08-01 ENCOUNTER — Ambulatory Visit (INDEPENDENT_AMBULATORY_CARE_PROVIDER_SITE_OTHER): Payer: Managed Care, Other (non HMO) | Admitting: Sports Medicine

## 2015-08-01 ENCOUNTER — Encounter: Payer: Self-pay | Admitting: Sports Medicine

## 2015-08-01 VITALS — BP 101/61 | HR 64 | Temp 97.8°F | Resp 16 | Wt 227.1 lb

## 2015-08-01 DIAGNOSIS — M1732 Unilateral post-traumatic osteoarthritis, left knee: Secondary | ICD-10-CM

## 2015-08-01 NOTE — Assessment & Plan Note (Signed)
Still have not obtained Supartz syringes, regular injection today, return once obtain Supartz syringes to start Viscosupplementation.

## 2015-08-01 NOTE — Progress Notes (Signed)
  Subjective:    CC: left knee pain  HPI: This is a pleasant 44 year old female, she is here to discuss recurrent knee pain, we injected her about 4 months ago and she did well, subsequently she had a return of pain and we decided to proceed with viscous supplementation, Jacklyn Shell was approved however we have not received to delivery, and neither has she. She thought she would be able to do her injections today. Pain is moderate, persistent and localized at the joint lines with gelling, no mechanical symptoms.  Past medical history, Surgical history, Family history not pertinant except as noted below, Social history, Allergies, and medications have been entered into the medical record, reviewed, and no changes needed.   Review of Systems: No fevers, chills, night sweats, weight loss, chest pain, or shortness of breath.   Objective:    General: Well Developed, well nourished, and in no acute distress.  Neuro: Alert and oriented x3, extra-ocular muscles intact, sensation grossly intact.  HEENT: Normocephalic, atraumatic, pupils equal round reactive to light, neck supple, no masses, no lymphadenopathy, thyroid nonpalpable.  Skin: Warm and dry, no rashes. Cardiac: Regular rate and rhythm, no murmurs rubs or gallops, no lower extremity edema.  Respiratory: Clear to auscultation bilaterally. Not using accessory muscles, speaking in full sentences.  Procedure: Real-time Ultrasound Guided Injection of left knee Device: GE Logiq E  Verbal informed consent obtained.  Time-out conducted.  Noted no overlying erythema, induration, or other signs of local infection.  Skin prepped in a sterile fashion.  Local anesthesia: Topical Ethyl chloride.  With sterile technique and under real time ultrasound guidance:  2 mL lidocaine, 2 mL Marcaine, 2 mL kenalog 40 injected easily. Completed without difficulty  Pain immediately resolved suggesting accurate placement of the medication.  Advised to call if  fevers/chills, erythema, induration, drainage, or persistent bleeding.  Images permanently stored and available for review in the ultrasound unit.  Impression: Technically successful ultrasound guided injection.  Impression and Recommendations:

## 2015-08-02 ENCOUNTER — Telehealth: Payer: Self-pay

## 2015-08-02 ENCOUNTER — Other Ambulatory Visit: Payer: Self-pay | Admitting: Family Medicine

## 2015-08-02 DIAGNOSIS — Z139 Encounter for screening, unspecified: Secondary | ICD-10-CM

## 2015-08-02 NOTE — Telephone Encounter (Signed)
Pt left VM stating her knee injections were recalled and that's why we have not received them yet. Pt states she will let us know when the shipment is mailed.

## 2015-08-08 ENCOUNTER — Ambulatory Visit: Payer: Managed Care, Other (non HMO)

## 2015-08-09 ENCOUNTER — Encounter: Payer: Self-pay | Admitting: Family Medicine

## 2015-08-09 ENCOUNTER — Ambulatory Visit (INDEPENDENT_AMBULATORY_CARE_PROVIDER_SITE_OTHER): Payer: Managed Care, Other (non HMO) | Admitting: Family Medicine

## 2015-08-09 ENCOUNTER — Ambulatory Visit (INDEPENDENT_AMBULATORY_CARE_PROVIDER_SITE_OTHER): Payer: Managed Care, Other (non HMO)

## 2015-08-09 VITALS — BP 130/59 | HR 62 | Wt 224.0 lb

## 2015-08-09 DIAGNOSIS — M5412 Radiculopathy, cervical region: Secondary | ICD-10-CM | POA: Insufficient documentation

## 2015-08-09 DIAGNOSIS — M546 Pain in thoracic spine: Secondary | ICD-10-CM

## 2015-08-09 DIAGNOSIS — M542 Cervicalgia: Secondary | ICD-10-CM | POA: Diagnosis not present

## 2015-08-09 NOTE — Assessment & Plan Note (Signed)
Likely contusion. Treatment with physical therapy.

## 2015-08-09 NOTE — Progress Notes (Signed)
Quick Note:  Normal, no changes. ______ 
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
## 2015-08-09 NOTE — Assessment & Plan Note (Signed)
Likely strain of trapezius. Treat with physical therapy. Return in a few weeks.

## 2015-08-09 NOTE — Patient Instructions (Signed)
Thank you for coming in today. Come back or go to the emergency room if you notice new weakness new numbness problems walking or bowel or bladder problems.  TENS UNIT: This is helpful for muscle pain and spasm.   Search and Purchase a TENS 7000 2nd edition at www.tenspros.com. It should be less than $30.     TENS unit instructions: Do not shower or bathe with the unit on Turn the unit off before removing electrodes or batteries If the electrodes lose stickiness add a drop of water to the electrodes after they are disconnected from the unit and place on plastic sheet. If you continued to have difficulty, call the TENS unit company to purchase more electrodes. Do not apply lotion on the skin area prior to use. Make sure the skin is clean and dry as this will help prolong the life of the electrodes. After use, always check skin for unusual red areas, rash or other skin difficulties. If there are any skin problems, does not apply electrodes to the same area. Never remove the electrodes from the unit by pulling the wires. Do not use the TENS unit or electrodes other than as directed. Do not change electrode placement without consultating your therapist or physician. Keep 2 fingers with between each electrode. Wear time ratio is 2:1, on to off times.    For example on for 30 minutes off for 15 minutes and then on for 30 minutes off for 15 minutes  Attend PT.   Recheck in a few weeks.

## 2015-08-10 ENCOUNTER — Ambulatory Visit (INDEPENDENT_AMBULATORY_CARE_PROVIDER_SITE_OTHER): Payer: Managed Care, Other (non HMO)

## 2015-08-10 DIAGNOSIS — Z1231 Encounter for screening mammogram for malignant neoplasm of breast: Secondary | ICD-10-CM

## 2015-08-10 DIAGNOSIS — Z139 Encounter for screening, unspecified: Secondary | ICD-10-CM

## 2015-08-14 ENCOUNTER — Encounter: Payer: Self-pay | Admitting: Physician Assistant

## 2015-08-14 ENCOUNTER — Ambulatory Visit (INDEPENDENT_AMBULATORY_CARE_PROVIDER_SITE_OTHER): Payer: Managed Care, Other (non HMO) | Admitting: Physician Assistant

## 2015-08-14 VITALS — BP 138/56 | HR 64 | Ht 64.0 in | Wt 224.0 lb

## 2015-08-14 DIAGNOSIS — N921 Excessive and frequent menstruation with irregular cycle: Secondary | ICD-10-CM
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
## 2015-08-15 ENCOUNTER — Encounter: Payer: Self-pay | Admitting: Physician Assistant

## 2015-08-15 ENCOUNTER — Ambulatory Visit (INDEPENDENT_AMBULATORY_CARE_PROVIDER_SITE_OTHER): Payer: Managed Care, Other (non HMO)

## 2015-08-15 DIAGNOSIS — N888 Other specified noninflammatory disorders of cervix uteri: Secondary | ICD-10-CM | POA: Diagnosis not present

## 2015-08-15 DIAGNOSIS — D259 Leiomyoma of uterus, unspecified: Secondary | ICD-10-CM | POA: Insufficient documentation

## 2015-08-15 MED ORDER — MEDROXYPROGESTERONE ACETATE 10 MG PO TABS
10.0000 mg | ORAL_TABLET | Freq: Every day | ORAL | Status: DC
Start: 2015-08-15 — End: 2015-09-19

## 2015-08-15 NOTE — Addendum Note (Signed)
Addended by: Donella Stade on: 08/15/2015 01:12 PM   Modules accepted: Orders

## 2015-09-05 ENCOUNTER — Ambulatory Visit (INDEPENDENT_AMBULATORY_CARE_PROVIDER_SITE_OTHER): Payer: Managed Care, Other (non HMO) | Admitting: Sports Medicine

## 2015-09-05 VITALS — BP 108/64 | HR 70 | Temp 97.7°F | Resp 18 | Wt 226.4 lb

## 2015-09-05 DIAGNOSIS — M1732 Unilateral post-traumatic osteoarthritis, left knee: Secondary | ICD-10-CM

## 2015-09-05 DIAGNOSIS — M545 Low back pain, unspecified: Secondary | ICD-10-CM | POA: Insufficient documentation

## 2015-09-05 NOTE — Addendum Note (Signed)
Addended by: Elizabeth Sauer on: 09/05/2015 09:07 AM   Modules accepted: Orders, Medications

## 2015-09-05 NOTE — Assessment & Plan Note (Signed)
Supartz injection #1 of 5 into the left knee, return in one week for #2.

## 2015-09-05 NOTE — Assessment & Plan Note (Signed)
Started when coming off of progesterone for abnormal uterine bleeding. This suggests that there is an aspect of endometriosis. I would like her to touch base with OB/GYN for further evaluation, and if they do not think there is a gynecologic cause we will additionally workup her lumbar spine, next step Would be an MRI.

## 2015-09-05 NOTE — Progress Notes (Signed)
  Procedure: Real-time Ultrasound Guided Injection of left knee Device: GE Logiq E  Verbal informed consent obtained.  Time-out conducted.  Noted no overlying erythema, induration, or other signs of local infection.  Skin prepped in a sterile fashion.  Local anesthesia: Topical Ethyl chloride.  With sterile technique and under real time ultrasound guidance: 25 mg/2.5 mL of Supartz (sodium hyaluronate) in a prefilled syringe was injected easily into the knee through a 22-gauge needle. Completed without difficulty  Pain immediately resolved suggesting accurate placement of the medication.  Advised to call if fevers/chills, erythema, induration, drainage, or persistent bleeding.  Images permanently stored and available for review in the ultrasound unit.  Impression: Technically successful ultrasound guided injection. 

## 2015-09-06 ENCOUNTER — Telehealth: Payer: Self-pay | Admitting: Sports Medicine

## 2015-09-06 NOTE — Telephone Encounter (Signed)
Received information from [facility] to complete a PA on Supartz injections. Tried to submit this online and Pt could not be located. Left message for Pt to see if her insurance we have on file is still up to date. Callback information provided for return call.

## 2015-09-12 ENCOUNTER — Ambulatory Visit (INDEPENDENT_AMBULATORY_CARE_PROVIDER_SITE_OTHER): Payer: Managed Care, Other (non HMO) | Admitting: Sports Medicine

## 2015-09-12 VITALS — BP 110/69 | HR 64 | Resp 16

## 2015-09-12 DIAGNOSIS — M1732 Unilateral post-traumatic osteoarthritis, left knee: Secondary | ICD-10-CM

## 2015-09-12 NOTE — Addendum Note (Signed)
Addended by: Elizabeth Sauer on: 09/12/2015 09:38 AM   Modules accepted: Medications

## 2015-09-12 NOTE — Assessment & Plan Note (Signed)
Supartz injection #2 of 5 into the left knee, return in one week for #3

## 2015-09-12 NOTE — Progress Notes (Signed)
  Procedure: Real-time Ultrasound Guided Injection of left knee Device: GE Logiq E  Verbal informed consent obtained.  Time-out conducted.  Noted no overlying erythema, induration, or other signs of local infection.  Skin prepped in a sterile fashion.  Local anesthesia: Topical Ethyl chloride.  With sterile technique and under real time ultrasound guidance: 25 mg/2.5 mL of Supartz (sodium hyaluronate) in a prefilled syringe was injected easily into the knee through a 22-gauge needle. Completed without difficulty  Pain immediately resolved suggesting accurate placement of the medication.  Advised to call if fevers/chills, erythema, induration, drainage, or persistent bleeding.  Images permanently stored and available for review in the ultrasound unit.  Impression: Technically successful ultrasound guided injection. 

## 2015-09-19 ENCOUNTER — Ambulatory Visit (INDEPENDENT_AMBULATORY_CARE_PROVIDER_SITE_OTHER): Payer: Managed Care, Other (non HMO) | Admitting: Sports Medicine

## 2015-09-19 ENCOUNTER — Encounter: Payer: Self-pay | Admitting: Sports Medicine

## 2015-09-19 VITALS — BP 108/67 | HR 69 | Resp 18 | Wt 227.4 lb

## 2015-09-19 DIAGNOSIS — M1732 Unilateral post-traumatic osteoarthritis, left knee: Secondary | ICD-10-CM | POA: Diagnosis not present

## 2015-09-19 NOTE — Progress Notes (Signed)
  Procedure: Real-time Ultrasound Guided Injection of left knee Device: GE Logiq E  Verbal informed consent obtained.  Time-out conducted.  Noted no overlying erythema, induration, or other signs of local infection.  Skin prepped in a sterile fashion.  Local anesthesia: Topical Ethyl chloride.  With sterile technique and under real time ultrasound guidance: 25 mg/2.5 mL of Supartz (sodium hyaluronate) in a prefilled syringe was injected easily into the knee through a 22-gauge needle. Completed without difficulty  Pain immediately resolved suggesting accurate placement of the medication.  Advised to call if fevers/chills, erythema, induration, drainage, or persistent bleeding.  Images permanently stored and available for review in the ultrasound unit.  Impression: Technically successful ultrasound guided injection. 

## 2015-09-19 NOTE — Addendum Note (Signed)
Addended by: Elizabeth Sauer on: 09/19/2015 10:09 AM   Modules accepted: Orders, Medications

## 2015-09-19 NOTE — Assessment & Plan Note (Signed)
Supartz injection #3 into the left knee, return in one week for #4 of 5.

## 2015-09-26 ENCOUNTER — Ambulatory Visit (INDEPENDENT_AMBULATORY_CARE_PROVIDER_SITE_OTHER): Payer: Managed Care, Other (non HMO) | Admitting: Sports Medicine

## 2015-09-26 VITALS — BP 115/71 | HR 77 | Wt 233.0 lb

## 2015-09-26 DIAGNOSIS — M1732 Unilateral post-traumatic osteoarthritis, left knee: Secondary | ICD-10-CM | POA: Diagnosis not present

## 2015-09-26 DIAGNOSIS — M5412 Radiculopathy, cervical region: Secondary | ICD-10-CM | POA: Diagnosis not present

## 2015-09-26 NOTE — Assessment & Plan Note (Signed)
Supartz injection #4 into the left knee, currently pain-free. Return in one week for injection #5.
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
## 2015-10-02 LAB — HM PAP SMEAR: HM Pap smear: NEGATIVE

## 2015-10-03 ENCOUNTER — Ambulatory Visit (INDEPENDENT_AMBULATORY_CARE_PROVIDER_SITE_OTHER): Payer: Managed Care, Other (non HMO) | Admitting: Sports Medicine

## 2015-10-03 ENCOUNTER — Encounter: Payer: Self-pay | Admitting: Sports Medicine

## 2015-10-03 VITALS — BP 106/68 | HR 70 | Resp 16

## 2015-10-03 DIAGNOSIS — M1732 Unilateral post-traumatic osteoarthritis, left knee: Secondary | ICD-10-CM

## 2015-10-03 NOTE — Progress Notes (Signed)
  Procedure: Real-time Ultrasound Guided Injection of left knee Device: GE Logiq E  Verbal informed consent obtained.  Time-out conducted.  Noted no overlying erythema, induration, or other signs of local infection.  Skin prepped in a sterile fashion.  Local anesthesia: Topical Ethyl chloride.  With sterile technique and under real time ultrasound guidance:   25 mg/2.5 mL of Supartz (sodium hyaluronate) in a prefilled syringe was injected easily into the knee through a 22-gauge needle. Completed without difficulty  Pain immediately resolved suggesting accurate placement of the medication.  Advised to call if fevers/chills, erythema, induration, drainage, or persistent bleeding.  Images permanently stored and available for review in the ultrasound unit.  Impression: Technically successful ultrasound guided injection.  Impression and Recommendations:

## 2015-10-03 NOTE — Assessment & Plan Note (Signed)
Supartz injection #5, left knee.

## 2016-04-11 ENCOUNTER — Emergency Department (INDEPENDENT_AMBULATORY_CARE_PROVIDER_SITE_OTHER)
Admission: EM | Admit: 2016-04-11 | Discharge: 2016-04-11 | Disposition: A | Discharge status: Other Acute Inpatient Hospital | Payer: Managed Care, Other (non HMO) | Source: Home / Self Care | Attending: Family Medicine | Admitting: Family Medicine

## 2016-04-11 DIAGNOSIS — N201 Calculus of ureter: Secondary | ICD-10-CM | POA: Insufficient documentation

## 2016-04-11 DIAGNOSIS — R1012 Left upper quadrant pain: Secondary | ICD-10-CM | POA: Diagnosis not present

## 2016-04-11 DIAGNOSIS — R110 Nausea: Secondary | ICD-10-CM

## 2016-04-11 DIAGNOSIS — R109 Unspecified abdominal pain: Secondary | ICD-10-CM

## 2016-04-11 MED ORDER — ONDANSETRON 4 MG PO TBDP: 4.0000 mg | ORAL_TABLET | Freq: Once | ORAL | Status: DC

## 2016-04-11 NOTE — ED Triage Notes (Signed)
Pt stated that left lower back pain radiating around to her groin started this am.  She had relief for a few hours then it started back.  She is having pain level 10, nausea, dizziness.  Pt has a history of kidney stones, and she feels that this may be one.

## 2016-04-11 NOTE — ED Notes (Signed)
Dr. Assunta Found sent patient to [facility].

## 2016-04-20 NOTE — ED Provider Notes (Signed)
[name]    CSN: [id number] Arrival date & time: 04/11/16  1312  First Provider Contact:  First MD Initiated Contact with Patient 04/11/16 1406        History   Chief Complaint Chief Complaint  Patient presents with  . Back Pain    left side  . Groin Pain    HPI Laura [name] is a 45 y.o. female.   Patient was awakened at 5:30am today by left flank pain radiating to her left groin.  The pain resolved, and recurred one hour ago.  She has also had nausea without vomiting.  She is presently not able to urinate.  She has a past history of kidney stones, with her last episode two years ago.   The history is provided by the patient.  Flank Pain  This is a recurrent problem. The current episode started 6 to 12 hours ago. The problem occurs constantly. The problem has been gradually worsening. Pertinent negatives include no abdominal pain. Associated symptoms comments: Nausea and dizziness. Nothing aggravates the symptoms. Nothing relieves the symptoms. She has tried nothing for the symptoms.    Past Medical History:  Diagnosis Date  . Allergic rhinitis   . Dyslipidemia   . Dysmenorrhea   . Gall bladder disease   . Infertility, female   . Obesity   . Polycystic ovary disease   . Sebaceous hyperplasia     on tretinoin    Patient Active Problem List   Diagnosis Date Noted  . Low back pain 09/05/2015  . Uterine fibroid 08/15/2015  . Menorrhagia with irregular cycle 08/14/2015  . Radiculitis of left cervical region 08/09/2015  . Post-traumatic osteoarthritis of left knee 02/15/2015  . History of bariatric surgery 02/06/2015  . GERD (gastroesophageal reflux disease) 06/19/2014  . OSA on CPAP 06/19/2014  . Leg swelling 05/18/2012  . Sebaceous hyperplasia 10/24/2011  . BACK PAIN, THORACIC REGION 07/25/2009  . 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/19/2006  . OBESITY, NOS 05/19/2006  . DEPRESSION, MAJOR, RECURRENT 05/19/2006  . 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 LAPAROSCOPIC CHOLECYSTECTOMY    . SLEEVE GASTROPLASTY    . SUPRAVENTRICULAR TACHYCARDIA ABLATION N/A 07/23/2011   Procedure: SUPRAVENTRICULAR TACHYCARDIA ABLATION;  Surgeon: Evans Lance, MD;  Location: [facility] CATH LAB;  Service: Cardiovascular;  Laterality: N/A;  . transthoracic echcardiogram  09/15/06    OB History    No data available       Home Medications    Prior to Admission medications   Medication Sig Start Date End Date Taking? Authorizing Provider  dexamethasone (DECADRON) 0.1 % ophthalmic suspension Place 4 drops into the right ear 2 (two) times daily. 04/26/15   Gregor Hams, MD  fluticasone (FLONASE) 50 MCG/ACT nasal spray One spray in each nostril twice a day, use left hand for right nostril, and right hand for left nostril. 05/18/12   Silverio Decamp, MD  hydrochlorothiazide (MICROZIDE) 12.5 MG capsule TAKE ONE CAPSULE BY MOUTH EVERY DAY AS NEEDED FOR LOWER LEG EDEMA 11/27/14   Hali Marry, MD  Sodium Hyaluronate (SUPARTZ) 25 MG/2.5ML SOSY Injected intra-articular weekly for 5 weeks, diagnosis: Primary osteoarthritis of the knee 04/11/15   Silverio Decamp, MD  traMADol (ULTRAM) 50 MG tablet 1-2 tabs by mouth Q8 hours, maximum 6 tabs per day. 04/09/15   Silverio Decamp, MD  Vitamin D, Ergocalciferol, (DRISDOL) 50000 UNITS CAPS capsule Reported on 08/14/2015 05/05/14   Historical Provider, MD  Family History Family History  Problem Relation Age of Onset  . Colon cancer Mother 55  . Breast cancer Mother   . Diabetes Mother   . Heart attack Mother     AMI, in late 65's  . Depression Mother   . Heart disease Mother   . Hypertension Father   . Thyroid disease Brother   . Thyroid disease Sister     Social History Social History  Substance Use Topics  . Smoking status: Former Smoker    Quit date: 07/15/2002  . Smokeless tobacco: Not on file  . Alcohol use No     Allergies    Neomycin and Penicillins   Review of Systems Review of Systems  Gastrointestinal: Negative for abdominal pain.  Genitourinary: Positive for flank pain.     Physical Exam Triage Vital Signs ED Triage Vitals  Enc Vitals Group     BP 04/11/16 1359 124/77     Pulse Rate 04/11/16 1359 60     Resp --      Temp --      Temp src --      SpO2 --      Weight 04/11/16 1400 230 lb (104.3 kg)     Height 04/11/16 1400 5\' 4"  (1.626 m)     Head Circumference --      Peak Flow --      Pain Score 04/11/16 1405 10     Pain Loc --      Pain Edu? --      Excl. in [place]? --    No data found.   Updated Vital Signs BP 124/77 (BP Location: Right Arm)   Pulse 60   Ht 5\' 4"  (1.626 m)   Wt 230 lb (104.3 kg)   BMI 39.48 kg/m   Visual Acuity Right Eye Distance:   Left Eye Distance:   Bilateral Distance:    Right Eye Near:   Left Eye Near:    Bilateral Near:     Physical Exam  Constitutional: She appears well-developed and well-nourished. She appears distressed.  HENT:  Head: Normocephalic.  Right Ear: External ear normal.  Left Ear: External ear normal.  Nose: Nose normal.  Mouth/Throat: Oropharynx is clear and moist.  Eyes: Conjunctivae are normal. Pupils are equal, round, and reactive to light.  Neck: Neck supple.  Cardiovascular: Normal heart sounds.   Pulmonary/Chest: Breath sounds normal.  Abdominal: Bowel sounds are normal. There is tenderness.  Left flank tenderness present  Musculoskeletal: She exhibits no edema.  Neurological: She is alert.  Skin: Skin is warm and dry. She is not diaphoretic.  Nursing note and vitals reviewed.    UC Treatments / Results  Labs (all labs ordered are listed, but only abnormal results are displayed) Labs Reviewed - No data to display  EKG  EKG Interpretation None       Radiology No results found.  Procedures Procedures (including critical care time)  Medications Ordered in UC Medications - No data to display   Initial  Impression / Assessment and Plan / UC Course  I have reviewed the triage vital signs and the nursing notes.  Pertinent labs & imaging results that were available during my care of the patient were reviewed by me and considered in my medical decision making (see chart for details).  Clinical Course     Suspect recurrent nephrolithiasis.  Patient unable to urinate.  Recommend that she proceed to local hospital emergency department for further evaluation and treatment.  Her  vital signs are stable and she is safe to be transported by private vehicle.    Final Clinical Impressions(s) / UC Diagnoses   Final diagnoses:  Acute left flank pain  Nausea    New Prescriptions Discharge Medication List as of 04/11/2016  2:10 PM       Kandra Nicolas, MD 04/20/16 1428

## 2016-07-18 ENCOUNTER — Ambulatory Visit (INDEPENDENT_AMBULATORY_CARE_PROVIDER_SITE_OTHER): Payer: Managed Care, Other (non HMO)

## 2016-07-18 DIAGNOSIS — Z23 Encounter for immunization: Secondary | ICD-10-CM

## 2016-07-23 ENCOUNTER — Telehealth: Payer: Self-pay

## 2016-07-23 DIAGNOSIS — M1732 Unilateral post-traumatic osteoarthritis, left knee: Secondary | ICD-10-CM

## 2016-07-23 NOTE — Telephone Encounter (Signed)
Pt left VM stating that she would like to have Supartz reordered for her left knee. Please assist.

## 2016-07-24 MED ORDER — SODIUM HYALURONATE (VISCOSUP) 25 MG/2.5ML IX SOSY: PREFILLED_SYRINGE | INTRA_ARTICULAR | 0 refills | Status: DC

## 2016-07-24 NOTE — Telephone Encounter (Signed)
Pt notified order sent and we're awaiting approval.

## 2016-07-24 NOTE — Telephone Encounter (Signed)
Reordered and sent to mail order pharmacy

## 2016-07-29 NOTE — Telephone Encounter (Signed)
Received fax from pharmacy requiring completion of PA request. Form completed, signed, and faxed back.

## 2016-09-25 NOTE — Telephone Encounter (Signed)
[facility], spoke with Olivia Mackie, regarding status update on Supartz. Was advised the Supartz order was cancelled on 08/06/16 due to the Pt not enrolling in the [facility] mail [facility]. There is documentation that the Pt was informed of cancelled order.

## 2016-10-17 ENCOUNTER — Ambulatory Visit (INDEPENDENT_AMBULATORY_CARE_PROVIDER_SITE_OTHER): Payer: Managed Care, Other (non HMO) | Admitting: Sports Medicine

## 2016-10-17 DIAGNOSIS — M1611 Unilateral primary osteoarthritis, right hip: Secondary | ICD-10-CM | POA: Insufficient documentation

## 2016-10-17 DIAGNOSIS — M7062 Trochanteric bursitis, left hip: Secondary | ICD-10-CM | POA: Diagnosis not present

## 2016-10-17 DIAGNOSIS — M25551 Pain in right hip: Secondary | ICD-10-CM | POA: Insufficient documentation

## 2016-10-17 DIAGNOSIS — M7061 Trochanteric bursitis, right hip: Secondary | ICD-10-CM | POA: Insufficient documentation

## 2016-10-17 NOTE — Assessment & Plan Note (Signed)
Bursa injection as above, hip abductor rehabilitation. Return to see me in one month retest hip strength.

## 2016-10-17 NOTE — Progress Notes (Signed)
   Subjective:    I'm seeing this patient as a consultation for:  Dr. Beatrice Lecher  CC: Left hip pain  HPI:  For weeks this pleasant 46 year old female has had increasing pain over the lateral hip, moderate, persistent without radiation, worse with laying on the lateral side, she has been in the gym doing a lot of exercising.  Past medical history:  Negative.  See flowsheet/record as well for more information.  Surgical history: Negative.  See flowsheet/record as well for more information.  Family history: Negative.  See flowsheet/record as well for more information.  Social history: Negative.  See flowsheet/record as well for more information.  Allergies, and medications have been entered into the medical record, reviewed, and no changes needed.   Review of Systems: No headache, visual changes, nausea, vomiting, diarrhea, constipation, dizziness, abdominal pain, skin rash, fevers, chills, night sweats, weight loss, swollen lymph nodes, body aches, joint swelling, muscle aches, chest pain, shortness of breath, mood changes, visual or auditory hallucinations.   Objective:   General: Well Developed, well nourished, and in no acute distress.  Neuro/Psych: Alert and oriented x3, extra-ocular muscles intact, able to move all 4 extremities, sensation grossly intact. Skin: Warm and dry, no rashes noted.  Respiratory: Not using accessory muscles, speaking in full sentences, trachea midline.  Cardiovascular: Pulses palpable, no extremity edema. Abdomen: Does not appear distended. Left Hip: ROM IR: 60 Deg, ER: 60 Deg, Flexion: 120 Deg, Extension: 100 Deg, Abduction: 45 Deg, Adduction: 45 Deg Strength IR: 5/5, ER: 5/5, Flexion: 5/5, Extension: 5/5, Abduction: 5/5, Adduction: 5/5 Pelvic alignment unremarkable to inspection and palpation. Standing hip rotation and gait without trendelenburg / unsteadiness. Greater trochanter with tenderness to palpation, hip abductor's are weak. No  tenderness over piriformis. No SI joint tenderness and normal minimal SI movement.  Procedure: Real-time Ultrasound Guided Injection of left greater trochanteric bursa Device: GE Logiq E  Verbal informed consent obtained.  Time-out conducted.  Noted no overlying erythema, induration, or other signs of local infection.  Skin prepped in a sterile fashion.  Local anesthesia: Topical Ethyl chloride.  With sterile technique and under real time ultrasound guidance:  25-gauge needle advanced to the greater trochanter, 1 mL kenalog 40, 2 mL lidocaine, 2 mL Marcaine injected easily. Completed without difficulty  Pain immediately resolved suggesting accurate placement of the medication.  Advised to call if fevers/chills, erythema, induration, drainage, or persistent bleeding.  Images permanently stored and available for review in the ultrasound unit.  Impression: Technically successful ultrasound guided injection.  Impression and Recommendations:   This case required medical decision making of moderate complexity.  Trochanteric bursitis, left hip Bursa injection as above, hip abductor rehabilitation. Return to see me in one month retest hip strength.

## 2016-11-14 ENCOUNTER — Ambulatory Visit (INDEPENDENT_AMBULATORY_CARE_PROVIDER_SITE_OTHER): Payer: Managed Care, Other (non HMO)

## 2016-11-14 ENCOUNTER — Ambulatory Visit (INDEPENDENT_AMBULATORY_CARE_PROVIDER_SITE_OTHER): Payer: Managed Care, Other (non HMO) | Admitting: Sports Medicine

## 2016-11-14 DIAGNOSIS — M7062 Trochanteric bursitis, left hip: Secondary | ICD-10-CM

## 2016-11-14 DIAGNOSIS — M25552 Pain in left hip: Secondary | ICD-10-CM | POA: Diagnosis not present

## 2016-11-14 DIAGNOSIS — M1612 Unilateral primary osteoarthritis, left hip: Secondary | ICD-10-CM | POA: Diagnosis not present

## 2016-11-14 NOTE — Assessment & Plan Note (Signed)
Hip joint injection, pelvic x-rays.  Return to see me in one month.

## 2016-11-14 NOTE — Progress Notes (Signed)
  Subjective:    CC: Follow-up  HPI: Rheda returns, we injected her left trochanteric bursa at the last visit, she had a good response to some degree but had persistent pain that she localized over the anterior thigh and groin, worse with going up stairs. Moderate, persistent with radiation to the buttock. No mechanical symptoms, no trauma.  Past medical history:  Negative.  See flowsheet/record as well for more information.  Surgical history: Negative.  See flowsheet/record as well for more information.  Family history: Negative.  See flowsheet/record as well for more information.  Social history: Negative.  See flowsheet/record as well for more information.  Allergies, and medications have been entered into the medical record, reviewed, and no changes needed.   Review of Systems: No fevers, chills, night sweats, weight loss, chest pain, or shortness of breath.   Objective:    General: Well Developed, well nourished, and in no acute distress.  Neuro: Alert and oriented x3, extra-ocular muscles intact, sensation grossly intact.  HEENT: Normocephalic, atraumatic, pupils equal round reactive to light, neck supple, no masses, no lymphadenopathy, thyroid nonpalpable.  Skin: Warm and dry, no rashes. Cardiac: Regular rate and rhythm, no murmurs rubs or gallops, no lower extremity edema.  Respiratory: Clear to auscultation bilaterally. Not using accessory muscles, speaking in full sentences. Left Hip: ROM IR: 60 Deg, ER: 60 Deg, Flexion: 120 Deg, Extension: 100 Deg, Abduction: 45 Deg, Adduction: 10 Deg, reproduction of pain with internal rotation, this time at the groin Strength IR: 5/5, ER: 5/5, Flexion: 5/5, Extension: 5/5, Abduction: 5/5, Adduction: 5/5 Pelvic alignment unremarkable to inspection and palpation. Standing hip rotation and gait without trendelenburg / unsteadiness. Greater trochanter without tenderness to palpation. No tenderness over piriformis. No SI joint tenderness and  normal minimal SI movement.  Procedure: Real-time Ultrasound Guided Injection of left hip joint Device: GE Logiq E  Verbal informed consent obtained.  Time-out conducted.  Noted no overlying erythema, induration, or other signs of local infection.  Skin prepped in a sterile fashion.  Local anesthesia: Topical Ethyl chloride.  With sterile technique and under real time ultrasound guidance:  22-gauge spinal needle advanced to the femoral head/neck junction, bone contacted and 1 mL kenalog 40, 2 mL lidocaine, 2 mL bupivacaine injected easily. Completed without difficulty  Pain immediately resolved suggesting accurate placement of the medication.  Advised to call if fevers/chills, erythema, induration, drainage, or persistent bleeding.  Images permanently stored and available for review in the ultrasound unit.  Impression: Technically successful ultrasound guided injection.  Impression and Recommendations:    Primary osteoarthritis of left hip Hip joint injection, pelvic x-rays.  Return to see me in one month.  Trochanteric bursitis, left hip Resolved with injection and rehabilitation exercises.

## 2016-11-14 NOTE — Assessment & Plan Note (Signed)
Resolved with injection and rehabilitation exercises.

## 2016-12-05 ENCOUNTER — Emergency Department (INDEPENDENT_AMBULATORY_CARE_PROVIDER_SITE_OTHER)
Admission: EM | Admit: 2016-12-05 | Discharge: 2016-12-05 | Disposition: A | Discharge status: Home / Self Care | Payer: Managed Care, Other (non HMO) | Source: Home / Self Care | Attending: Family Medicine | Admitting: Family Medicine

## 2016-12-05 ENCOUNTER — Encounter: Payer: Self-pay | Admitting: *Deleted

## 2016-12-05 DIAGNOSIS — J302 Other seasonal allergic rhinitis: Secondary | ICD-10-CM

## 2016-12-05 DIAGNOSIS — J069 Acute upper respiratory infection, unspecified: Secondary | ICD-10-CM

## 2016-12-05 DIAGNOSIS — B9789 Other viral agents as the cause of diseases classified elsewhere: Secondary | ICD-10-CM

## 2016-12-05 LAB — POCT RAPID STREP A (OFFICE): RAPID STREP A SCREEN: NEGATIVE

## 2016-12-05 MED ORDER — BENZONATATE 200 MG PO CAPS: ORAL_CAPSULE | ORAL | 0 refills | Status: DC

## 2016-12-05 MED ORDER — PREDNISONE 20 MG PO TABS: ORAL_TABLET | ORAL | 0 refills | Status: DC

## 2016-12-05 MED ORDER — AZITHROMYCIN 250 MG PO TABS: ORAL_TABLET | ORAL | 0 refills | Status: DC

## 2016-12-05 NOTE — ED Provider Notes (Signed)
[name]    CSN: [id number] Arrival date & time: 12/05/16  0901     History   Chief Complaint Chief Complaint  Patient presents with  . Sore Throat  . Nasal Congestion  . Cough    HPI Laura [name] is a 46 y.o. female.   Patient complains of three day history of typical cold-like symptoms developing over several days, including mild sore throat, sinus congestion, headache, fatigue, and cough.  She has felt somewhat winded with activity but denies wheezing or pleuritic pain.  Her ears feel clogged without earache.  She has a history of seasonal rhinitis, but has had no improvement with Zyrtec and Nasacort. She has a past history of pneumonia six years ago.   The history is provided by the patient.    Past Medical History:  Diagnosis Date  . Allergic rhinitis   . Dyslipidemia   . Dysmenorrhea   . Gall bladder disease   . Infertility, female   . Obesity   . Polycystic ovary disease   . Sebaceous hyperplasia     on tretinoin    Patient Active Problem List   Diagnosis Date Noted  . Primary osteoarthritis of left hip 11/14/2016  . Trochanteric bursitis, left hip 10/17/2016  . Low back pain 09/05/2015  . Uterine fibroid 08/15/2015  . Menorrhagia with irregular cycle 08/14/2015  . Radiculitis of left cervical region 08/09/2015  . Post-traumatic osteoarthritis of left knee 02/15/2015  . History of bariatric surgery 02/06/2015  . GERD (gastroesophageal reflux disease) 06/19/2014  . OSA on CPAP 06/19/2014  . Leg swelling 05/18/2012  . Sebaceous hyperplasia 10/24/2011  . BACK PAIN, THORACIC REGION 07/25/2009  . 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/19/2006  . OBESITY, NOS 05/19/2006  . DEPRESSION, MAJOR, RECURRENT 05/19/2006  . 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    Past Surgical History:  Procedure Laterality Date  . LAPAROSCOPIC CHOLECYSTECTOMY    . SLEEVE GASTROPLASTY    . SUPRAVENTRICULAR TACHYCARDIA ABLATION N/A 07/23/2011   Procedure:  SUPRAVENTRICULAR TACHYCARDIA ABLATION;  Surgeon: Evans Lance, MD;  Location: [facility] CATH [facility];  Service: Cardiovascular;  Laterality: N/A;  . transthoracic echcardiogram  09/15/06    OB History    No data available       Home Medications    Prior to Admission medications   Medication Sig Start Date End Date Taking? Authorizing Provider  azithromycin (ZITHROMAX Z-PAK) 250 MG tablet Take 2 tabs today; then begin one tab once daily for 4 more days. (Rx void after 12/13/16) 12/05/16   Kandra Nicolas, MD  benzonatate (TESSALON) 200 MG capsule Take one cap by mouth at bedtime as needed for cough.  May repeat in 4 to 6 hours 12/05/16   Kandra Nicolas, MD  dexamethasone (DECADRON) 0.1 % ophthalmic suspension Place 4 drops into the right ear 2 (two) times daily. 04/26/15   Gregor Hams, MD  fluticasone (FLONASE) 50 MCG/ACT nasal spray One spray in each nostril twice a day, use left hand for right nostril, and right hand for left nostril. 05/18/12   Silverio Decamp, MD  iron polysaccharides (NIFEREX) 150 MG capsule Take by mouth. 10/10/16 10/10/17  Historical Provider, MD  Multiple Vitamin (THERA) TABS Take by mouth.    Historical Provider, MD  predniSONE (DELTASONE) 20 MG tablet Take one tab by mouth twice daily for 5 days, then one daily for 3 days. Take with food. 12/05/16   Kandra Nicolas, MD  Vitamin D, Ergocalciferol, (DRISDOL) 50000 UNITS CAPS capsule Reported  on 08/14/2015 05/05/14   Historical Provider, MD    Family History Family History  Problem Relation Age of Onset  . Colon cancer Mother 23  . Breast cancer Mother   . Diabetes Mother   . Heart attack Mother     AMI, in late 53's  . Depression Mother   . Heart disease Mother   . Hypertension Father   . Thyroid disease Brother   . Thyroid disease Sister     Social History Social History  Substance Use Topics  . Smoking status: Former Smoker    Quit date: 07/15/2002  . Smokeless tobacco: Never Used  . Alcohol use No      Allergies   Neomycin and Penicillins   Review of Systems Review of Systems + sore throat + cough No pleuritic pain No wheezing + nasal congestion + post-nasal drainage No sinus pain/pressure No itchy/red eyes No earache, but ears feel clogged No hemoptysis ? SOB No fever, + chills No nausea No vomiting No abdominal pain No diarrhea No urinary symptoms No skin rash + fatigue No myalgias + headache Used OTC meds without relief   Physical Exam Triage Vital Signs ED Triage Vitals  Enc Vitals Group     BP 12/05/16 0924 122/82     Pulse Rate 12/05/16 0924 84     Resp 12/05/16 0924 16     Temp 12/05/16 0924 99.5 F (37.5 C)     Temp Source 12/05/16 0924 Oral     SpO2 12/05/16 0924 98 %     Weight 12/05/16 0926 238 lb (108 kg)     Height --      Head Circumference --      Peak Flow --      Pain Score 12/05/16 0926 4     Pain Loc --      Pain Edu? --      Excl. in [place]? --    No data found.   Updated Vital Signs BP 122/82 (BP Location: Left Arm)   Pulse 84   Temp 99.5 F (37.5 C) (Oral)   Resp 16   Wt 238 lb (108 kg)   LMP 11/15/2016   SpO2 98%   BMI 40.85 kg/m   Visual Acuity Right Eye Distance:   Left Eye Distance:   Bilateral Distance:    Right Eye Near:   Left Eye Near:    Bilateral Near:     Physical Exam Nursing notes and Vital Signs reviewed. Appearance:  Patient appears stated age, and in no acute distress Eyes:  Pupils are equal, round, and reactive to light and accomodation.  Extraocular movement is intact.  Conjunctivae are not inflamed  Ears:  Canals normal.  Tympanic membranes normal.  Nose:  Congested turbinates.  No sinus tenderness. Pharynx:  Erythematous uvula. Neck:  Supple.  Tender enlarged posterior/lateral nodes are palpated bilaterally.  Tender prominent tonsillar nodes.  Lungs:  Clear to auscultation.  Breath sounds are equal.  Moving air well. Heart:  Regular rate and rhythm without murmurs, rubs, or gallops.   Abdomen:  Nontender without masses or hepatosplenomegaly.  Bowel sounds are present.  No CVA or flank tenderness.  Extremities:  No edema.  Skin:  No rash present.    UC Treatments / Results  Labs (all labs ordered are listed, but only abnormal results are displayed) Labs Reviewed  POCT RAPID STREP A (OFFICE) negative Tympanometry:  Right ear tympanogram normal; Left ear tympanogram normal    EKG  EKG Interpretation  None       Radiology No results found.  Procedures Procedures (including critical care time)  Medications Ordered in UC Medications - No data to display   Initial Impression / Assessment and Plan / UC Course  I have reviewed the triage vital signs and the nursing notes.  Pertinent labs & imaging results that were available during my care of the patient were reviewed by me and considered in my medical decision making (see chart for details).    There is no evidence of bacterial infection today.   Treat symptomatically for now  Prescription written for Benzonatate (Tessalon) to take at bedtime for night-time cough.  Begin prednisone burst/taper. Take plain guaifenesin (1200mg  extended release tabs such as Mucinex) twice daily, with plenty of water, for cough and congestion.  May add Pseudoephedrine (30mg , one or two every 4 to 6 hours) for sinus congestion.  Get adequate rest.   May use Afrin nasal spray (or generic oxymetazoline) each morning for about 5 days and then discontinue.  Also recommend using saline nasal spray several times daily and saline nasal irrigation (AYR is a common brand).  Use Nasacort nasal spray each morning after using Afrin nasal spray and saline nasal irrigation. Try warm salt water gargles for sore throat.  Stop all antihistamines for now, and other non-prescription cough/cold preparations. Begin Azithromycin if not improving about one week or if persistent fever develops (Given a prescription to hold, with an expiration date)   Follow-up with family doctor if not improving about10 days.     Final Clinical Impressions(s) / UC Diagnoses   Final diagnoses:  Viral URI with cough  Seasonal allergic rhinitis, unspecified trigger    New Prescriptions New Prescriptions   AZITHROMYCIN (ZITHROMAX Z-PAK) 250 MG TABLET    Take 2 tabs today; then begin one tab once daily for 4 more days. (Rx void after 12/13/16)   BENZONATATE (TESSALON) 200 MG CAPSULE    Take one cap by mouth at bedtime as needed for cough.  May repeat in 4 to 6 hours   PREDNISONE (DELTASONE) 20 MG TABLET    Take one tab by mouth twice daily for 5 days, then one daily for 3 days. Take with food.     Kandra Nicolas, MD 12/05/16 1030

## 2016-12-05 NOTE — ED Triage Notes (Signed)
Patient c/o 2 days of burning sinuses and throat and dry cough. T-max 100.3. H/o seasonal allergies.

## 2016-12-05 NOTE — Discharge Instructions (Signed)
Take plain guaifenesin (1200mg  extended release tabs such as Mucinex) twice daily, with plenty of water, for cough and congestion.  May add Pseudoephedrine (30mg , one or two every 4 to 6 hours) for sinus congestion.  Get adequate rest.   May use Afrin nasal spray (or generic oxymetazoline) each morning for about 5 days and then discontinue.  Also recommend using saline nasal spray several times daily and saline nasal irrigation (AYR is a common brand).  Use Nasacort nasal spray each morning after using Afrin nasal spray and saline nasal irrigation. Try warm salt water gargles for sore throat.  Stop all antihistamines for now, and other non-prescription cough/cold preparations. Begin Azithromycin if not improving about one week or if persistent fever develops   Follow-up with family doctor if not improving about10 days.

## 2016-12-10 ENCOUNTER — Encounter: Payer: Self-pay | Admitting: Sports Medicine

## 2016-12-10 ENCOUNTER — Ambulatory Visit (INDEPENDENT_AMBULATORY_CARE_PROVIDER_SITE_OTHER): Payer: Managed Care, Other (non HMO) | Admitting: Sports Medicine

## 2016-12-10 DIAGNOSIS — M7062 Trochanteric bursitis, left hip: Secondary | ICD-10-CM | POA: Diagnosis not present

## 2016-12-10 DIAGNOSIS — M1612 Unilateral primary osteoarthritis, left hip: Secondary | ICD-10-CM

## 2016-12-10 MED ORDER — BENZONATATE 200 MG PO CAPS: 200.0000 mg | ORAL_CAPSULE | Freq: Three times a day (TID) | ORAL | 0 refills | Status: DC | PRN

## 2016-12-10 NOTE — Progress Notes (Signed)
  Subjective:    CC: Follow-up  HPI: Left hip pain: Anterior joint related pain has resolved after hip joint injection. She still has some pain over the lateral hip. We did a trochanteric bursa injection about 2 months ago. She is currently working on her home hip abductor rehabilitation regimen but things are going slowly. No mechanical symptoms, pain shoots down laterally from the trochanteric bursa to the lateral thigh when going up and down stairs.  Past medical history:  Negative.  See flowsheet/record as well for more information.  Surgical history: Negative.  See flowsheet/record as well for more information.  Family history: Negative.  See flowsheet/record as well for more information.  Social history: Negative.  See flowsheet/record as well for more information.  Allergies, and medications have been entered into the medical record, reviewed, and no changes needed.   Review of Systems: No fevers, chills, night sweats, weight loss, chest pain, or shortness of breath.   Objective:    General: Well Developed, well nourished, and in no acute distress.  Neuro: Alert and oriented x3, extra-ocular muscles intact, sensation grossly intact.  HEENT: Normocephalic, atraumatic, pupils equal round reactive to light, neck supple, no masses, no lymphadenopathy, thyroid nonpalpable.  Skin: Warm and dry, no rashes. Cardiac: Regular rate and rhythm, no murmurs rubs or gallops, no lower extremity edema.  Respiratory: Clear to auscultation bilaterally. Not using accessory muscles, speaking in full sentences. Left Hip: ROM IR: 60 Deg, ER: 60 Deg, Flexion: 120 Deg, Extension: 100 Deg, Abduction: 45 Deg, Adduction: 45 Deg Strength IR: 5/5, ER: 5/5, Flexion: 5/5, Extension: 5/5, Abduction: 5/5, Adduction: 5/5 Pelvic alignment unremarkable to inspection and palpation. Standing hip rotation and gait without trendelenburg / unsteadiness. Greater trochanter with tenderness to palpation. No tenderness over  piriformis. No SI joint tenderness and normal minimal SI movement. She did have severe pain with attempted side step up on the affected side.  Impression and Recommendations:    Primary osteoarthritis of left hip Hip joint pain is resolved after injection.  Trochanteric bursitis, left hip Recurrence of pain after injection 2 months ago. I did advise months of aggressive hip abductor rehabilitation, she did have significant pain with attempting side step. Protocol given, return in 6 weeks for this.

## 2016-12-10 NOTE — Patient Instructions (Signed)
Hip Rehabilitation Protocol:  1.  Side leg raises.  3x30 with no weight, then 3x15 with 2 lb ankle weight, then 3x15 with 5 lb ankle weight 2.  Standing hip rotation.  3x30 with no weight, then 3x15 with 2 lb ankle weight, then 3x15 with 5 lb ankle weight. 3.  Side step ups.  3x30 with no weight, then 3x15 with 5 lbs in backpack, then 3x15 with 10 lbs in backpack. 

## 2016-12-10 NOTE — Assessment & Plan Note (Signed)
Hip joint pain is resolved after injection.

## 2016-12-10 NOTE — Assessment & Plan Note (Addendum)
Recurrence of pain after injection 2 months ago. I did advise months of aggressive hip abductor rehabilitation, she did have significant pain with attempting side step. Protocol given, return in 6 weeks for this.

## 2017-01-21 LAB — HM PAP SMEAR: HM Pap smear: NEGATIVE

## 2017-01-22 ENCOUNTER — Ambulatory Visit (INDEPENDENT_AMBULATORY_CARE_PROVIDER_SITE_OTHER): Payer: Managed Care, Other (non HMO) | Admitting: Sports Medicine

## 2017-01-22 DIAGNOSIS — M7062 Trochanteric bursitis, left hip: Secondary | ICD-10-CM

## 2017-01-22 DIAGNOSIS — E6609 Other obesity due to excess calories: Secondary | ICD-10-CM | POA: Diagnosis not present

## 2017-01-22 MED ORDER — TOPIRAMATE 50 MG PO TABS: ORAL_TABLET | ORAL | 3 refills | Status: DC

## 2017-01-22 NOTE — Assessment & Plan Note (Signed)
Post gastric sleep, good weight loss, gain some of it back. Now on Belviq with bariatrics, adding Topamax to help her with weight loss. Ultimately she is going to proceed with plastic surgery referral for abdominoplasty.

## 2017-01-22 NOTE — Progress Notes (Signed)
  Subjective:    CC: Follow-up  HPI: Trochanteric bursitis: Fantastic improvements with the injection 3-1/2 months ago and aggressive home physical therapy. She has not yet plateaued in terms of her pain relief.  Obesity: Is post Pfannenstiel incision for cesarean section as well as longitudinal laparotomy for emergency cesarean, she's also had a gastric sleep. Has lost a lot of weight, would like to lose some more weight and then consider abdominoplasty.  Past medical history:  Negative.  See flowsheet/record as well for more information.  Surgical history: Negative.  See flowsheet/record as well for more information.  Family history: Negative.  See flowsheet/record as well for more information.  Social history: Negative.  See flowsheet/record as well for more information.  Allergies, and medications have been entered into the medical record, reviewed, and no changes needed.   Review of Systems: No fevers, chills, night sweats, weight loss, chest pain, or shortness of breath.   Objective:    General: Well Developed, well nourished, and in no acute distress.  Neuro: Alert and oriented x3, extra-ocular muscles intact, sensation grossly intact.  HEENT: Normocephalic, atraumatic, pupils equal round reactive to light, neck supple, no masses, no lymphadenopathy, thyroid nonpalpable.  Skin: Warm and dry, no rashes. Cardiac: Regular rate and rhythm, no murmurs rubs or gallops, no lower extremity edema.  Respiratory: Clear to auscultation bilaterally. Not using accessory muscles, speaking in full sentences.  Impression and Recommendations:    Trochanteric bursitis, left hip Fantastic improvements, previous injection was 3-1/2 months ago. She will continue with aggressive rehabilitation exercises which seem to be taking her most of the way. Return as needed for this, if she does plateau in terms of pain relief I do think we need to get an MRI for pelvis before proceeding with any further  interventional treatment.  OBESITY, NOS Post gastric sleep, good weight loss, gain some of it back. Now on Belviq with bariatrics, adding Topamax to help her with weight loss. Ultimately she is going to proceed with plastic surgery referral for abdominoplasty.  I spent 25 minutes with this patient, greater than 50% was face-to-face time counseling regarding the above diagnoses

## 2017-01-22 NOTE — Assessment & Plan Note (Signed)
Fantastic improvements, previous injection was 3-1/2 months ago. She will continue with aggressive rehabilitation exercises which seem to be taking her most of the way. Return as needed for this, if she does plateau in terms of pain relief I do think we need to get an MRI for pelvis before proceeding with any further interventional treatment.

## 2017-02-21 ENCOUNTER — Encounter: Payer: Self-pay | Admitting: Family Medicine

## 2017-03-23 ENCOUNTER — Other Ambulatory Visit: Payer: Self-pay | Admitting: Sports Medicine

## 2017-03-23 DIAGNOSIS — E6609 Other obesity due to excess calories: Secondary | ICD-10-CM

## 2017-03-23 MED ORDER — TOPIRAMATE 50 MG PO TABS: 50.0000 mg | ORAL_TABLET | Freq: Two times a day (BID) | ORAL | 3 refills | Status: DC

## 2017-06-15 ENCOUNTER — Ambulatory Visit (INDEPENDENT_AMBULATORY_CARE_PROVIDER_SITE_OTHER): Payer: Managed Care, Other (non HMO) | Admitting: Sports Medicine

## 2017-06-15 ENCOUNTER — Encounter: Payer: Self-pay | Admitting: Sports Medicine

## 2017-06-15 DIAGNOSIS — M7062 Trochanteric bursitis, left hip: Secondary | ICD-10-CM

## 2017-06-15 DIAGNOSIS — M7061 Trochanteric bursitis, right hip: Secondary | ICD-10-CM | POA: Diagnosis not present

## 2017-06-15 NOTE — Assessment & Plan Note (Signed)
Bilateral injections, previous injection on the left was about 8 months ago. Advised aggressive rehabilitation exercises, can return to see me in a month.

## 2017-06-15 NOTE — Progress Notes (Signed)
Subjective:    CC: Bilateral hip pain  HPI: This is a pleasant 46 year old female with known bilateral trochanteric bursitis, I injected both of her hips back in March of this year, she did well and is recently having recurrence of pain.  Has really not been all that diligent with the rehab exercises, pain is bilateral over the greater trochanters, moderate, persistent without radiation.  Desires repeat interventional treatment today.  Past medical history:  Negative.  See flowsheet/record as well for more information.  Surgical history: Negative.  See flowsheet/record as well for more information.  Family history: Negative.  See flowsheet/record as well for more information.  Social history: Negative.  See flowsheet/record as well for more information.  Allergies, and medications have been entered into the medical record, reviewed, and no changes needed.   Review of Systems: No fevers, chills, night sweats, weight loss, chest pain, or shortness of breath.   Objective:    General: Well Developed, well nourished, and in no acute distress.  Neuro: Alert and oriented x3, extra-ocular muscles intact, sensation grossly intact.  HEENT: Normocephalic, atraumatic, pupils equal round reactive to light, neck supple, no masses, no lymphadenopathy, thyroid nonpalpable.  Skin: Warm and dry, no rashes. Cardiac: Regular rate and rhythm, no murmurs rubs or gallops, no lower extremity edema.  Respiratory: Clear to auscultation bilaterally. Not using accessory muscles, speaking in full sentences. Hip bilateral hips: ROM IR: 60 Deg, ER: 60 Deg, Flexion: 120 Deg, Extension: 100 Deg, Abduction: 45 Deg, Adduction: 45 Deg Strength IR: 5/5, ER: 5/5, Flexion: 5/5, Extension: 5/5, Abduction: 5/5, Adduction: 5/5 Pelvic alignment unremarkable to inspection and palpation. Standing hip rotation and gait without trendelenburg / unsteadiness. Greater trochanter with severe tenderness to palpation. No tenderness  over piriformis. No SI joint tenderness and normal minimal SI movement.  Procedure: Real-time Ultrasound Guided Injection of left greater trochanteric bursa Device: GE Logiq E  Verbal informed consent obtained.  Time-out conducted.  Noted no overlying erythema, induration, or other signs of local infection.  Skin prepped in a sterile fashion.  Local anesthesia: Topical Ethyl chloride.  With sterile technique and under real time ultrasound guidance: Using a 22-gauge spinal needle advanced into the greater trochanteric bursa, I then injected 1 cc kenalog 40, 2 cc lidocaine, 2 cc bupivacaine. Completed without difficulty  Pain immediately resolved suggesting accurate placement of the medication.  Advised to call if fevers/chills, erythema, induration, drainage, or persistent bleeding.  Images permanently stored and available for review in the ultrasound unit.  Impression: Technically successful ultrasound guided injection.  Procedure: Real-time Ultrasound Guided Injection of right greater trochanteric bursa Device: GE Logiq E  Verbal informed consent obtained.  Time-out conducted.  Noted no overlying erythema, induration, or other signs of local infection.  Skin prepped in a sterile fashion.  Local anesthesia: Topical Ethyl chloride.  With sterile technique and under real time ultrasound guidance: Using a 22-gauge spinal needle advanced into the greater trochanteric bursa, I then injected 1 cc kenalog 40, 2 cc lidocaine, 2 cc bupivacaine. Completed without difficulty  Pain immediately resolved suggesting accurate placement of the medication.  Advised to call if fevers/chills, erythema, induration, drainage, or persistent bleeding.  Images permanently stored and available for review in the ultrasound unit.  Impression: Technically successful ultrasound guided injection.  Impression and Recommendations:    Trochanteric bursitis of both hips Bilateral injections, previous injection on the  left was about 8 months ago. Advised aggressive rehabilitation exercises, can return to see me in a month.  ___________________________________________  Gwen Her. Dianah Field, M.D., ABFM., CAQSM. Primary Care and [place] Instructor of [employer] [facility] [employer]

## 2017-10-27 ENCOUNTER — Encounter: Payer: Self-pay | Admitting: Family Medicine

## 2017-10-27 ENCOUNTER — Ambulatory Visit (INDEPENDENT_AMBULATORY_CARE_PROVIDER_SITE_OTHER): Payer: Managed Care, Other (non HMO) | Admitting: Family Medicine

## 2017-10-27 VITALS — BP 138/72 | HR 84 | Ht 64.0 in | Wt 249.0 lb

## 2017-10-27 DIAGNOSIS — R208 Other disturbances of skin sensation: Secondary | ICD-10-CM | POA: Diagnosis not present

## 2017-10-27 DIAGNOSIS — H6121 Impacted cerumen, right ear: Secondary | ICD-10-CM | POA: Diagnosis not present

## 2017-10-27 NOTE — Patient Instructions (Signed)
Call if nasal symptoms get worse over the week or if not improving with the use of Claritin.

## 2017-10-27 NOTE — Progress Notes (Signed)
   Subjective:    Patient ID: Laura [name], female    DOB: Jul 31, 1971, 47 y.o.   MRN: [id number]  HPI 47 year old female comes in today for upper respiratory symptoms.  She said last night she started getting a deep burning in her nose no fevers.  But then she noticed when she woke up this morning she could not hear out of her right ear.  It just feels full.  No pain.  She did take Claritin and feels like the burning sensation in her nose is actually a little better.  No fevers chills or sweats.   Review of Systems     Objective:   Physical Exam  Constitutional: She is oriented to person, place, and time. She appears well-developed and well-nourished.  HENT:  Head: Normocephalic and atraumatic.  Right Ear: External ear normal.  Left Ear: External ear normal.  Nose: Nose normal.  Mouth/Throat: Oropharynx is clear and moist.  TMs and canals are clear.   Eyes: Conjunctivae and EOM are normal. Pupils are equal, round, and reactive to light.  Neck: Neck supple. No thyromegaly present.  Cardiovascular: Normal rate, regular rhythm and normal heart sounds.  Pulmonary/Chest: Effort normal and breath sounds normal. She has no wheezes.  Lymphadenopathy:    She has no cervical adenopathy.  Neurological: She is alert and oriented to person, place, and time.  Skin: Skin is warm and dry.  Psychiatric: She has a normal mood and affect.          Assessment & Plan:  Burning in the nose could be allergic rhinitis versus the beginning of an upper respiratory infection.  Just encouraged her to keep an eye on symptoms and okay to use the Claritin daily for the next couple of weeks.  Hearing loss secondary to cerumen impaction in the right ear-exam confirms cerumen impaction-irrigation performed.  Patient tolerated well.  If Symptoms do not improve then please let us know. She felt some immediate improvement after irrigation.   Indication: Cerumen impaction of the ear(s) Medical necessity  statement: On physical examination, cerumen impairs clinically significant portions of the external auditory canal, and tympanic membrane. Noted obstructive, copious cerumen that cannot be removed without magnification and instrumentations requiring physician skills Consent: Discussed benefits and risks of procedure and verbal consent obtained Procedure: Patient was prepped for the procedure. Utilized an otoscope to assess and take note of the ear canal, the tympanic membrane, and the presence, amount, and placement of the cerumen. Gentle water irrigation and  was utilized to remove cerumen.  Post procedure examination: shows cerumen was completely removed. Patient tolerated procedure well. The patient is made aware that they may experience temporary vertigo, temporary hearing loss, and temporary discomfort. If these symptom last for more than 24 hours to call the clinic or proceed to the ED.

## 2017-12-03 DIAGNOSIS — E46 Unspecified protein-calorie malnutrition: Secondary | ICD-10-CM | POA: Insufficient documentation

## 2017-12-14 ENCOUNTER — Ambulatory Visit (INDEPENDENT_AMBULATORY_CARE_PROVIDER_SITE_OTHER): Payer: Managed Care, Other (non HMO) | Admitting: Sports Medicine

## 2017-12-14 ENCOUNTER — Ambulatory Visit (INDEPENDENT_AMBULATORY_CARE_PROVIDER_SITE_OTHER): Payer: Managed Care, Other (non HMO)

## 2017-12-14 ENCOUNTER — Encounter: Payer: Self-pay | Admitting: Sports Medicine

## 2017-12-14 DIAGNOSIS — R1013 Epigastric pain: Secondary | ICD-10-CM

## 2017-12-14 DIAGNOSIS — Z87442 Personal history of urinary calculi: Secondary | ICD-10-CM

## 2017-12-14 DIAGNOSIS — R1032 Left lower quadrant pain: Secondary | ICD-10-CM | POA: Diagnosis not present

## 2017-12-14 DIAGNOSIS — M76891 Other specified enthesopathies of right lower limb, excluding foot: Secondary | ICD-10-CM | POA: Insufficient documentation

## 2017-12-14 LAB — CBC WITH DIFFERENTIAL/PLATELET
Basophils Absolute: 20 cells/uL (ref 0–200)
Basophils Relative: 0.3 %
Eosinophils Absolute: 20 cells/uL (ref 15–500)
Eosinophils Relative: 0.3 %
HCT: 38.8 % (ref 35.0–45.0)
Hemoglobin: 13.3 g/dL (ref 11.7–15.5)
Lymphs Abs: 1670 cells/uL (ref 850–3900)
MCH: 30.4 pg (ref 27.0–33.0)
MCHC: 34.3 g/dL (ref 32.0–36.0)
MCV: 88.8 fL (ref 80.0–100.0)
MPV: 9.7 fL (ref 7.5–12.5)
Monocytes Relative: 4.3 %
Neutro Abs: 4607 cells/uL (ref 1500–7800)
Neutrophils Relative %: 69.8 %
Platelets: 221 Thousand/uL (ref 140–400)
RBC: 4.37 Million/uL (ref 3.80–5.10)
RDW: 11.8 % (ref 11.0–15.0)
Total Lymphocyte: 25.3 %
WBC mixed population: 284 cells/uL (ref 200–950)
WBC: 6.6 Thousand/uL (ref 3.8–10.8)

## 2017-12-14 LAB — COMPREHENSIVE METABOLIC PANEL WITH GFR
AST: 17 U/L (ref 10–35)
Albumin: 3.6 g/dL (ref 3.6–5.1)
Alkaline phosphatase (APISO): 60 U/L (ref 33–115)
BUN: 9 mg/dL (ref 7–25)
Glucose, Bld: 93 mg/dL (ref 65–99)

## 2017-12-14 LAB — COMPREHENSIVE METABOLIC PANEL
AG Ratio: 1.6 (calc) (ref 1.0–2.5)
ALT: 21 U/L (ref 6–29)
CO2: 30 mmol/L (ref 20–32)
Calcium: 8.5 mg/dL — ABNORMAL LOW (ref 8.6–10.2)
Chloride: 105 mmol/L (ref 98–110)
Creat: 0.88 mg/dL (ref 0.50–1.10)
Globulin: 2.2 g/dL (calc) (ref 1.9–3.7)
Potassium: 4.5 mmol/L (ref 3.5–5.3)
Sodium: 139 mmol/L (ref 135–146)
Total Bilirubin: 0.5 mg/dL (ref 0.2–1.2)
Total Protein: 5.8 g/dL — ABNORMAL LOW (ref 6.1–8.1)

## 2017-12-14 LAB — POCT URINALYSIS DIPSTICK
Bilirubin, UA: NEGATIVE
Blood, UA: NEGATIVE
Glucose, UA: NEGATIVE
Ketones, UA: NEGATIVE
Leukocytes, UA: NEGATIVE
Nitrite, UA: NEGATIVE
Protein, UA: NEGATIVE
Spec Grav, UA: 1.02 (ref 1.010–1.025)
Urobilinogen, UA: 1 U/dL
pH, UA: 7 (ref 5.0–8.0)

## 2017-12-14 LAB — LIPASE: Lipase: 24 U/L (ref 7–60)

## 2017-12-14 LAB — AMYLASE: Amylase: 27 U/L (ref 21–101)

## 2017-12-14 MED ORDER — TAMSULOSIN HCL 0.4 MG PO CAPS: 0.4000 mg | ORAL_CAPSULE | Freq: Every day | ORAL | 3 refills | Status: DC

## 2017-12-14 MED ORDER — KETOROLAC TROMETHAMINE 30 MG/ML IJ SOLN
30.0000 mg | Freq: Once | INTRAMUSCULAR | Status: AC
  Administered 2017-12-14: 30 mg via INTRAMUSCULAR

## 2017-12-14 MED ORDER — GI COCKTAIL ~~LOC~~
30.0000 mL | Freq: Once | ORAL | Status: AC
  Administered 2017-12-14: 30 mL via ORAL

## 2017-12-14 MED ORDER — HYDROCODONE-ACETAMINOPHEN 10-325 MG PO TABS: 1.0000 | ORAL_TABLET | Freq: Three times a day (TID) | ORAL | 0 refills | Status: DC | PRN

## 2017-12-14 NOTE — Assessment & Plan Note (Addendum)
Right loin to groin. She does have a history of nephrolithiasis, no blood in urinalysis in the emergency department, bilateral nonobstructing nephroliths were seen on CT without contrast but these may be moving to the ureter. Normal CBC, CMP, urinalysis in the emergency department. Toradol 30 for severe pain, increasing to hydrocodone 10/325. Continue Zofran for nausea. GI cocktail here to rule out gastric cause. I do still think there may be some nephrolithiasis considering her loin to groin pain. Adding Flomax, repeat urinalysis was negative. Repeat labs including amylase and lipase KUB.  In addition considering reproduction of pain with resisted hip flexion there may be an element of hip flexor tendinopathy.

## 2017-12-14 NOTE — Progress Notes (Signed)
Subjective:    CC: Acute abdominal pain  HPI: This is a pleasant 47 year old female, she has a history of nephrolithiasis.  Late last week and over the weekend she developed severe and worsening abdominal pain, right flank radiating to the right groin.  No hematuria, mild nausea, no vomiting, diarrhea, constitutional symptoms.  As the pain worsened she went to the emergency department.  CBC, CMP were both negative, she had a CT of the abdomen and pelvis without contrast, as well as urinalysis both of which were negative with the exception of nonobstructive renal nephrolithiasis without ureterolithiasis.  She was discharged with some pain medication appropriately.  On further questioning her pain is in the right groin, severe enough to cause nausea, and is worse when trying to take a step and lift her right leg as in getting into the car.  I reviewed the past medical history, family history, social history, surgical history, and allergies today and no changes were needed.  Please see the problem list section below in epic for further details.  Past Medical History: Past Medical History:  Diagnosis Date  . Allergic rhinitis   . Dyslipidemia   . Dysmenorrhea   . Gall bladder disease   . Infertility, female   . Obesity   . Polycystic ovary disease   . 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 LAPAROSCOPIC CHOLECYSTECTOMY    . SLEEVE GASTROPLASTY    . SUPRAVENTRICULAR TACHYCARDIA ABLATION N/A 07/23/2011   Procedure: SUPRAVENTRICULAR TACHYCARDIA ABLATION;  Surgeon: Evans Lance, MD;  Location: [facility] CATH LAB;  Service: Cardiovascular;  Laterality: N/A;  . transthoracic echcardiogram  09/15/06   Social History: Social History   Socioeconomic History  . Marital status: Married    Spouse name: matthew  . Number of children: 5  . Years of education: Not on file  . Highest education level: Not on file  Occupational  History  . Occupation: Surveyor, quantity: [employer]  . Financial resource strain: Not on file  . Food insecurity:    Worry: Not on file    Inability: Not on file  . Transportation needs:    Medical: Not on file    Non-medical: Not on file  Tobacco Use  . Smoking status: Former Smoker    Last attempt to quit: 07/15/2002    Years since quitting: 15.4  . Smokeless tobacco: Never Used  Substance and Sexual Activity  . Alcohol use: No    Alcohol/week: 0.0 oz  . Drug use: No  . Sexual activity: Yes    Partners: Male  Lifestyle  . Physical activity:    Days per week: Not on file    Minutes per session: Not on file  . Stress: Not on file  Relationships  . Social connections:    Talks on phone: Not on file    Gets together: Not on file    Attends religious service: Not on file    Active member of club or organization: Not on file    Attends meetings of clubs or organizations: Not on file    Relationship status: Not on file  Other Topics Concern  . Not on file  Social History Narrative   Some exercise.  In bariatric program at [facility].    Family History: Family History  Problem Relation Age of Onset  . Colon cancer Mother 24  . Breast cancer  Mother   . Diabetes Mother   . Heart attack Mother        AMI, in late 85's  . Depression Mother   . Heart disease Mother   . Hypertension Father   . Thyroid disease Brother   . Thyroid disease Sister    Allergies: Allergies  Allergen Reactions  . Morphine Itching  . Neomycin   . Penicillins Hives   Medications: See med rec.  Review of Systems: No fevers, chills, night sweats, weight loss, chest pain, or shortness of breath.   Objective:    General: Well Developed, well nourished, and in no acute distress.  Neuro: Alert and oriented x3, extra-ocular muscles intact, sensation grossly intact.  HEENT: Normocephalic, atraumatic, pupils equal round reactive to light, neck supple, no masses, no  lymphadenopathy, thyroid nonpalpable.  Skin: Warm and dry, no rashes. Cardiac: Regular rate and rhythm, no murmurs rubs or gallops, no lower extremity edema.  Respiratory: Clear to auscultation bilaterally. Not using accessory muscles, speaking in full sentences. Abdomen: Soft, uncomfortable to palpation in the epigastrium with reproduction of nausea.  Otherwise no tenderness in the right lower quadrant, no guarding, rigidity, rebound tenderness, minimal right costovertebral angle pain. Right hip: ROM IR: 68 Deg with no pain, ER: 60 Deg, flexion: Limited to about 90 degrees, Extension: 100 Deg, Abduction: 45 Deg, Adduction: 45 Deg Strength IR: 5/5 with no pain, ER: 5/5, Flexion: 3/5, resisted flexion does reproduce severe right groin pain, Extension: 5/5, Abduction: 5/5, Adduction: 5/5 Pelvic alignment unremarkable to inspection and palpation. Standing hip rotation and gait without trendelenburg / unsteadiness. Greater trochanter without tenderness to palpation. No tenderness over piriformis. No SI joint tenderness and normal minimal SI movement.  Urinalysis reviewed and completely negative.  GI cocktail did not improve any of her pain  Impression and Recommendations:    Abdominal pain Right loin to groin. She does have a history of nephrolithiasis, no blood in urinalysis in the emergency department, bilateral nonobstructing nephroliths were seen on CT without contrast but these may be moving to the ureter. Normal CBC, CMP, urinalysis in the emergency department. Toradol 30 for severe pain, increasing to hydrocodone 10/325. Continue Zofran for nausea. GI cocktail here to rule out gastric cause. I do still think there may be some nephrolithiasis considering her loin to groin pain. Adding Flomax, repeat urinalysis was negative. Repeat labs including amylase and lipase KUB.  In addition considering reproduction of pain with resisted hip flexion there may be an element of hip flexor  tendinopathy.  I spent 40 minutes with this patient, greater than 50% was face-to-face time counseling regarding the above diagnoses ___________________________________________ Gwen Her. Dianah Field, M.D., ABFM., CAQSM. Primary Care and [place] Instructor of [place] of [facility] of Medicine

## 2017-12-17 ENCOUNTER — Encounter: Payer: Self-pay | Admitting: Sports Medicine

## 2017-12-17 ENCOUNTER — Ambulatory Visit (INDEPENDENT_AMBULATORY_CARE_PROVIDER_SITE_OTHER): Payer: Managed Care, Other (non HMO) | Admitting: Sports Medicine

## 2017-12-17 DIAGNOSIS — M25551 Pain in right hip: Secondary | ICD-10-CM

## 2017-12-17 DIAGNOSIS — R1031 Right lower quadrant pain: Secondary | ICD-10-CM

## 2017-12-17 NOTE — Assessment & Plan Note (Signed)
Pain seems to be declaring itself as more hip flexor. Because of failure of the previous conservative treatment and the severity of her pain we are doing a right hip joint and right iliopsoas tendon sheath injection. Return to see me next week, if insufficient relief we will get an MRI of her pelvis.

## 2017-12-17 NOTE — Progress Notes (Signed)
Subjective:    CC: Right hip pain  HPI: Laura [name] returns, we saw her earlier in the week for severe right lower quadrant abdominal pain, epigastric pain, she had somewhat of a GI work-up including a CT of the abdomen and pelvis with no contrast that was overall negative, KUB that was negative, labs including urinalysis and urine culture were both negative.  On exam she had more pain in the right lower quadrant, worsened with resisted hip flexion.  We started some pain medication, and treated her conservatively.  Unfortunately she returns today with almost no pain relief.  Pain radiates from the back to the anterior groin.  I reviewed the past medical history, family history, social history, surgical history, and allergies today and no changes were needed.  Please see the problem list section below in epic for further details.  Past Medical History: Past Medical History:  Diagnosis Date  . Allergic rhinitis   . Dyslipidemia   . Dysmenorrhea   . Gall bladder disease   . Infertility, female   . Obesity   . Polycystic ovary disease   . 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 LAPAROSCOPIC CHOLECYSTECTOMY    . SLEEVE GASTROPLASTY    . SUPRAVENTRICULAR TACHYCARDIA ABLATION N/A 07/23/2011   Procedure: SUPRAVENTRICULAR TACHYCARDIA ABLATION;  Surgeon: Evans Lance, MD;  Location: [facility] CATH LAB;  Service: Cardiovascular;  Laterality: N/A;  . transthoracic echcardiogram  09/15/06   Social History: Social History   Socioeconomic History  . Marital status: Married    Spouse name: matthew  . Number of children: 5  . Years of education: Not on file  . Highest education level: Not on file  Occupational History  . Occupation: Surveyor, quantity: [employer]  . Financial resource strain: Not on file  . Food insecurity:    Worry: Not on file    Inability: Not on file  .  Transportation needs:    Medical: Not on file    Non-medical: Not on file  Tobacco Use  . Smoking status: Former Smoker    Last attempt to quit: 07/15/2002    Years since quitting: 15.4  . Smokeless tobacco: Never Used  Substance and Sexual Activity  . Alcohol use: No    Alcohol/week: 0.0 oz  . Drug use: No  . Sexual activity: Yes    Partners: Male  Lifestyle  . Physical activity:    Days per week: Not on file    Minutes per session: Not on file  . Stress: Not on file  Relationships  . Social connections:    Talks on phone: Not on file    Gets together: Not on file    Attends religious service: Not on file    Active member of club or organization: Not on file    Attends meetings of clubs or organizations: Not on file    Relationship status: Not on file  Other Topics Concern  . Not on file  Social History Narrative   Some exercise.  In bariatric program at [facility].    Family History: Family History  Problem Relation Age of Onset  . Colon cancer Mother 33  . Breast cancer Mother   . Diabetes Mother   . Heart attack Mother        AMI, in late 23's  . Depression Mother   . Heart disease Mother   .  Hypertension Father   . Thyroid disease Brother   . Thyroid disease Sister    Allergies: Allergies  Allergen Reactions  . Morphine Itching  . Neomycin   . Penicillins Hives   Medications: See med rec.  Review of Systems: No fevers, chills, night sweats, weight loss, chest pain, or shortness of breath.   Objective:    General: Well Developed, well nourished, and in no acute distress.  Neuro: Alert and oriented x3, extra-ocular muscles intact, sensation grossly intact.  HEENT: Normocephalic, atraumatic, pupils equal round reactive to light, neck supple, no masses, no lymphadenopathy, thyroid nonpalpable.  Skin: Warm and dry, no rashes. Cardiac: Regular rate and rhythm, no murmurs rubs or gallops, no lower extremity edema.  Respiratory: Clear to auscultation  bilaterally. Not using accessory muscles, speaking in full sentences. Right hip: ROM IR: 60 Deg, ER: 60 Deg, Flexion: 120 Deg, Extension: 100 Deg, Abduction: 45 Deg, Adduction: 45 Deg Strength IR: 5/5, ER: 5/5, Flexion: 4-/5, with reproduction of pain, Extension: 5/5, Abduction: 5/5, Adduction: 5/5 Pelvic alignment unremarkable to inspection and palpation. Standing hip rotation and gait without trendelenburg / unsteadiness. Greater trochanter without tenderness to palpation. No tenderness over piriformis. No SI joint tenderness and normal minimal SI movement.  Procedure: Real-time Ultrasound Guided Injection of right hip joint and iliopsoas tendon sheath Device: GE Logiq E  Verbal informed consent obtained.  Time-out conducted.  Noted no overlying erythema, induration, or other signs of local infection.  Skin prepped in a sterile fashion.  Local anesthesia: Topical Ethyl chloride.  With sterile technique and under real time ultrasound guidance: I visualize the neurovascular structures medial to the site of injection, shifted the probe laterally and visualize the hip joint, only minimal effusion in the hip joint itself.  I advanced a 7 inch 22-gauge spinal needle towards the joint, contacted bone and injected some medication, I then withdrew the needle, advanced into the iliopsoas tendon sheath overlying the hip joint capsule and injected further medication for a total of 1 cc kenalog 40, 2 cc lidocaine, 2 cc bupivacaine. Completed without difficulty  Approximately 5 minutes after injection the majority of her pain was gone and she was able to flex her hip without much discomfort. Advised to call if fevers/chills, erythema, induration, drainage, or persistent bleeding.  Images permanently stored and available for review in the ultrasound unit.  Impression: Technically successful ultrasound guided injection.  Impression and Recommendations:    Abdominal pain Pain seems to be declaring  itself as more hip flexor. Because of failure of the previous conservative treatment and the severity of her pain we are doing a right hip joint and right iliopsoas tendon sheath injection. Return to see me next week, if insufficient relief we will get an MRI of her pelvis. ___________________________________________ Gwen Her. Dianah Field, M.D., ABFM., CAQSM. Primary Care and [place] Instructor of [place] of [facility] of Medicine

## 2017-12-31 ENCOUNTER — Encounter: Payer: Self-pay | Admitting: Sports Medicine

## 2017-12-31 ENCOUNTER — Ambulatory Visit (INDEPENDENT_AMBULATORY_CARE_PROVIDER_SITE_OTHER): Payer: Managed Care, Other (non HMO) | Admitting: Sports Medicine

## 2017-12-31 DIAGNOSIS — M76891 Other specified enthesopathies of right lower limb, excluding foot: Secondary | ICD-10-CM | POA: Diagnosis not present

## 2017-12-31 NOTE — Assessment & Plan Note (Signed)
Pain now resolved after iliopsoas flexor tendon sheath injection, I advanced the needle through the iliopsoas and into the hip joint as well at the last visit, adding hip flexor rehab exercises, return as needed.

## 2017-12-31 NOTE — Progress Notes (Signed)
Subjective:    CC: Follow-up  HPI: Laura [name] returns, I sort the last visit, she had been to the emergency department for severe right lower quadrant abdominal pain, initially suspected to be nephrolithiasis, CT was negative.  At the last visit her pain was more reproducible with hip flexion, I did an iliopsoas flexor tendon sheath as well as hip joint injection, she returns today with pain essentially completely resolved.  I reviewed the past medical history, family history, social history, surgical history, and allergies today and no changes were needed.  Please see the problem list section below in epic for further details.  Past Medical History: Past Medical History:  Diagnosis Date  . Allergic rhinitis   . Dyslipidemia   . Dysmenorrhea   . Gall bladder disease   . Infertility, female   . Obesity   . Polycystic ovary disease   . 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 LAPAROSCOPIC CHOLECYSTECTOMY    . SLEEVE GASTROPLASTY    . SUPRAVENTRICULAR TACHYCARDIA ABLATION N/A 07/23/2011   Procedure: SUPRAVENTRICULAR TACHYCARDIA ABLATION;  Surgeon: Evans Lance, MD;  Location: [facility] CATH LAB;  Service: Cardiovascular;  Laterality: N/A;  . transthoracic echcardiogram  09/15/06   Social History: Social History   Socioeconomic History  . Marital status: Married    Spouse name: matthew  . Number of children: 5  . Years of education: Not on file  . Highest education level: Not on file  Occupational History  . Occupation: Surveyor, quantity: [employer]  . Financial resource strain: Not on file  . Food insecurity:    Worry: Not on file    Inability: Not on file  . Transportation needs:    Medical: Not on file    Non-medical: Not on file  Tobacco Use  . Smoking status: Former Smoker    Last attempt to quit: 07/15/2002    Years since quitting: 15.4  . Smokeless tobacco:  Never Used  Substance and Sexual Activity  . Alcohol use: No    Alcohol/week: 0.0 oz  . Drug use: No  . Sexual activity: Yes    Partners: Male  Lifestyle  . Physical activity:    Days per week: Not on file    Minutes per session: Not on file  . Stress: Not on file  Relationships  . Social connections:    Talks on phone: Not on file    Gets together: Not on file    Attends religious service: Not on file    Active member of club or organization: Not on file    Attends meetings of clubs or organizations: Not on file    Relationship status: Not on file  Other Topics Concern  . Not on file  Social History Narrative   Some exercise.  In bariatric program at [facility].    Family History: Family History  Problem Relation Age of Onset  . Colon cancer Mother 43  . Breast cancer Mother   . Diabetes Mother   . Heart attack Mother        AMI, in late 58's  . Depression Mother   . Heart disease Mother   . Hypertension Father   . Thyroid disease Brother   . Thyroid disease Sister    Allergies: Allergies  Allergen Reactions  . Morphine Itching  . Neomycin   . Penicillins Hives   Medications: See  med rec.  Review of Systems: No fevers, chills, night sweats, weight loss, chest pain, or shortness of breath.   Objective:    General: Well Developed, well nourished, and in no acute distress.  Neuro: Alert and oriented x3, extra-ocular muscles intact, sensation grossly intact.  HEENT: Normocephalic, atraumatic, pupils equal round reactive to light, neck supple, no masses, no lymphadenopathy, thyroid nonpalpable.  Skin: Warm and dry, no rashes. Cardiac: Regular rate and rhythm, no murmurs rubs or gallops, no lower extremity edema.  Respiratory: Clear to auscultation bilaterally. Not using accessory muscles, speaking in full sentences. Right hip: ROM IR: 60 Deg, ER: 60 Deg, Flexion: 120 Deg, Extension: 100 Deg, Abduction: 45 Deg, Adduction: 45 Deg Strength IR: 5/5, ER: 5/5, Flexion:  5/5, Extension: 5/5, Abduction: 5/5, Adduction: 5/5 Pelvic alignment unremarkable to inspection and palpation. Standing hip rotation and gait without trendelenburg / unsteadiness. Greater trochanter without tenderness to palpation. No tenderness over piriformis. No SI joint tenderness and normal minimal SI movement.  Impression and Recommendations:    Tendinitis of right hip flexor Pain now resolved after iliopsoas flexor tendon sheath injection, I advanced the needle through the iliopsoas and into the hip joint as well at the last visit, adding hip flexor rehab exercises, return as needed. ___________________________________________ Gwen Her. Dianah Field, M.D., ABFM., CAQSM. Primary Care and [place] Instructor of [place] of [facility] of Medicine

## 2018-01-24 ENCOUNTER — Emergency Department (INDEPENDENT_AMBULATORY_CARE_PROVIDER_SITE_OTHER): Payer: Managed Care, Other (non HMO)

## 2018-01-24 ENCOUNTER — Encounter: Payer: Self-pay | Admitting: Emergency Medicine

## 2018-01-24 ENCOUNTER — Emergency Department (INDEPENDENT_AMBULATORY_CARE_PROVIDER_SITE_OTHER)
Admission: EM | Admit: 2018-01-24 | Discharge: 2018-01-24 | Disposition: A | Discharge status: Home / Self Care | Payer: Managed Care, Other (non HMO) | Source: Home / Self Care | Attending: Family Medicine | Admitting: Family Medicine

## 2018-01-24 DIAGNOSIS — B9789 Other viral agents as the cause of diseases classified elsewhere: Secondary | ICD-10-CM

## 2018-01-24 DIAGNOSIS — R05 Cough: Secondary | ICD-10-CM

## 2018-01-24 DIAGNOSIS — R509 Fever, unspecified: Secondary | ICD-10-CM

## 2018-01-24 DIAGNOSIS — J069 Acute upper respiratory infection, unspecified: Secondary | ICD-10-CM

## 2018-01-24 MED ORDER — IPRATROPIUM BROMIDE 0.06 % NA SOLN: 2.0000 | Freq: Four times a day (QID) | NASAL | 1 refills | Status: DC

## 2018-01-24 MED ORDER — BENZONATATE 100 MG PO CAPS: 100.0000 mg | ORAL_CAPSULE | Freq: Three times a day (TID) | ORAL | 0 refills | Status: DC

## 2018-01-24 NOTE — ED Triage Notes (Signed)
Patient c/o non-productive cough x 3 days, fever of 101 on Friday and Saturday, wheezing, taking Nyquil, bilateral ear pain, runny nose and body aches.

## 2018-01-24 NOTE — ED Provider Notes (Signed)
Laura [name] CARE    CSN: [id number] Arrival date & time: 01/24/18  1147     History   Chief Complaint Chief Complaint  Patient presents with  . Cough    HPI Laura [name] is a 47 y.o. female.   HPI Laura [name] is a 47 y.o. female presenting to UC with c/o 3 days of non-productive cough, fever Tmax 101*F, bilateral ear pain, rhinorrhea and body aches. Denies sore throat. She has taken Dayquil and Nyquil with minimal relief. No known sick contacts. Denies chest pain or SOB. Denies hx of asthma or COPD. Hx of pneumonia in the past.   Past Medical History:  Diagnosis Date  . Allergic rhinitis   . Dyslipidemia   . Dysmenorrhea   . Gall bladder disease   . Infertility, female   . Obesity   . Polycystic ovary disease   . Sebaceous hyperplasia     on tretinoin    Patient Active Problem List   Diagnosis Date Noted  . Tendinitis of right hip flexor 12/14/2017  . Primary osteoarthritis of left hip 11/14/2016  . Trochanteric bursitis of both hips 10/17/2016  . Low back pain 09/05/2015  . Uterine fibroid 08/15/2015  . Menorrhagia with irregular cycle 08/14/2015  . Radiculitis of left cervical region 08/09/2015  . Post-traumatic osteoarthritis of left knee 02/15/2015  . History of bariatric surgery 02/06/2015  . GERD (gastroesophageal reflux disease) 06/19/2014  . OSA on CPAP 06/19/2014  . Leg swelling 05/18/2012  . Sebaceous hyperplasia 10/24/2011  . BACK PAIN, THORACIC REGION 07/25/2009  . 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/19/2006  . OBESITY, NOS 05/19/2006  . DEPRESSION, MAJOR, RECURRENT 05/19/2006  . 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    Past Surgical History:  Procedure Laterality Date  . LAPAROSCOPIC CHOLECYSTECTOMY    . SLEEVE GASTROPLASTY    . SUPRAVENTRICULAR TACHYCARDIA ABLATION N/A 07/23/2011   Procedure: SUPRAVENTRICULAR TACHYCARDIA ABLATION;  Surgeon: Evans Lance, MD;  Location: [facility] CATH LAB;  Service: Cardiovascular;  Laterality: N/A;   . transthoracic echcardiogram  09/15/06    OB History   None      Home Medications    Prior to Admission medications   Medication Sig Start Date End Date Taking? Authorizing Provider  benzonatate (TESSALON) 100 MG capsule Take 1-2 capsules (100-200 mg total) by mouth every 8 (eight) hours. 01/24/18   Noe Gens, PA-C  ipratropium (ATROVENT) 0.06 % nasal spray Place 2 sprays into both nostrils 4 (four) times daily. 01/24/18   Noe Gens, PA-C  iron polysaccharides (NIFEREX) 150 MG capsule Take by mouth. 10/10/16 10/10/17  [provider]  Multiple Vitamin (THERA) TABS Take by mouth.    [provider]  Vitamin D, Ergocalciferol, (DRISDOL) 50000 UNITS CAPS capsule Reported on 08/14/2015 05/05/14   [provider]    Family History Family History  Problem Relation Age of Onset  . Colon cancer Mother 24  . Breast cancer Mother   . Diabetes Mother   . Heart attack Mother        AMI, in late 18's  . Depression Mother   . Heart disease Mother   . Hypertension Father   . Thyroid disease Brother   . Thyroid disease Sister     Social History Social History   Tobacco Use  . Smoking status: Former Smoker    Last attempt to quit: 07/15/2002    Years since quitting: 15.5  . Smokeless tobacco: Never Used  Substance Use Topics  . Alcohol use: No  Alcohol/week: 0.0 oz  . Drug use: No     Allergies   Morphine; Neomycin; and Penicillins   Review of Systems Review of Systems  Constitutional: Positive for fatigue and fever. Negative for chills.  HENT: Positive for congestion ( minimal) and rhinorrhea. Negative for ear pain, sore throat, trouble swallowing and voice change.   Respiratory: Positive for cough. Negative for shortness of breath.   Cardiovascular: Negative for chest pain and palpitations.  Gastrointestinal: Negative for abdominal pain, diarrhea, nausea and vomiting.  Musculoskeletal: Positive for arthralgias and myalgias. Negative for back  pain.  Skin: Negative for rash.     Physical Exam Triage Vital Signs ED Triage Vitals  Enc Vitals Group     BP 01/24/18 1301 122/83     Pulse Rate 01/24/18 1301 81     Resp --      Temp 01/24/18 1301 98.5 F (36.9 C)     Temp Source 01/24/18 1301 Oral     SpO2 01/24/18 1301 98 %     Weight 01/24/18 1302 247 lb 12 oz (112.4 kg)     Height 01/24/18 1302 5\' 4"  (1.626 m)     Head Circumference --      Peak Flow --      Pain Score 01/24/18 1302 2     Pain Loc --      Pain Edu? --      Excl. in [place]? --    No data found.  Updated Vital Signs BP 122/83 (BP Location: Right Arm)   Pulse 81   Temp 98.5 F (36.9 C) (Oral)   Ht 5\' 4"  (1.626 m)   Wt 247 lb 12 oz (112.4 kg)   LMP 01/15/2018   SpO2 98%   BMI 42.53 kg/m   Physical Exam  Constitutional: She is oriented to person, place, and time. She appears well-developed and well-nourished. No distress.  HENT:  Head: Normocephalic and atraumatic.  Right Ear: Tympanic membrane normal.  Left Ear: Tympanic membrane normal.  Nose: Rhinorrhea present. Right sinus exhibits no maxillary sinus tenderness and no frontal sinus tenderness. Left sinus exhibits no maxillary sinus tenderness and no frontal sinus tenderness.  Mouth/Throat: Uvula is midline, oropharynx is clear and moist and mucous membranes are normal.  Eyes: EOM are normal.  Neck: Normal range of motion. Neck supple.  Cardiovascular: Normal rate and regular rhythm.  Pulmonary/Chest: Effort normal and breath sounds normal. No stridor. No respiratory distress. She has no wheezes. She has no rales.  Musculoskeletal: Normal range of motion.  Lymphadenopathy:    She has no cervical adenopathy.  Neurological: She is alert and oriented to person, place, and time.  Skin: Skin is warm and dry. She is not diaphoretic.  Psychiatric: She has a normal mood and affect. Her behavior is normal.  Nursing note and vitals reviewed.    UC Treatments / Results  Labs (all labs ordered are  listed, but only abnormal results are displayed) Labs Reviewed - No data to display  EKG None  Radiology Dg Chest 2 View  Result Date: 01/24/2018 CLINICAL DATA:  Dry cough and fever for 3 days. EXAM: CHEST - 2 VIEW COMPARISON:  PA and lateral chest 08/09/2015. FINDINGS: The lungs are clear. Heart size is normal. No pneumothorax or pleural effusion. Acute or focal bony abnormality. IMPRESSION: Negative chest. Electronically Signed   By: Inge Rise M.D.   On: 01/24/2018 13:19    Procedures Procedures (including critical care time)  Medications Ordered in UC Medications -  No data to display  Initial Impression / Assessment and Plan / UC Course  I have reviewed the triage vital signs and the nursing notes.  Pertinent labs & imaging results that were available during my care of the patient were reviewed by me and considered in my medical decision making (see chart for details).     Discussed CXR with pt No indication for antibiotic at this time. Encouraged symptomatic treatment. Pt info packet provided.   Final Clinical Impressions(s) / UC Diagnoses   Final diagnoses:  Viral URI with cough     Discharge Instructions      You may take 500mg  acetaminophen every 4-6 hours or in combination with ibuprofen 400-600mg  every 6-8 hours as needed for pain, inflammation, and fever.  Be sure to drink at least eight 8oz glasses of water to stay well hydrated and get at least 8 hours of sleep at night, preferably more while sick.   Please follow up with family medicine in 5-7 days if not improving, sooner if worsening.    ED Prescriptions    Medication Sig Dispense Auth. Provider   benzonatate (TESSALON) 100 MG capsule Take 1-2 capsules (100-200 mg total) by mouth every 8 (eight) hours. 21 capsule Gerarda Fraction, Seana Underwood O, PA-C   ipratropium (ATROVENT) 0.06 % nasal spray Place 2 sprays into both nostrils 4 (four) times daily. 15 mL Noe Gens, PA-C     Controlled Substance  Prescriptions [place] Controlled Substance Registry consulted? Not Applicable   Tyrell Antonio 01/24/18 1737

## 2018-01-24 NOTE — Discharge Instructions (Signed)
°  You may take 500mg  acetaminophen every 4-6 hours or in combination with ibuprofen 400-600mg  every 6-8 hours as needed for pain, inflammation, and fever.  Be sure to drink at least eight 8oz glasses of water to stay well hydrated and get at least 8 hours of sleep at night, preferably more while sick.   Please follow up with family medicine in 5-7 days if not improving, sooner if worsening.

## 2018-01-28 ENCOUNTER — Encounter: Payer: Self-pay | Admitting: Family Medicine

## 2018-01-28 ENCOUNTER — Ambulatory Visit (INDEPENDENT_AMBULATORY_CARE_PROVIDER_SITE_OTHER): Payer: Managed Care, Other (non HMO) | Admitting: Family Medicine

## 2018-01-28 VITALS — BP 129/62 | HR 65 | Temp 98.5°F | Ht 64.0 in | Wt 250.0 lb

## 2018-01-28 DIAGNOSIS — J0100 Acute maxillary sinusitis, unspecified: Secondary | ICD-10-CM

## 2018-01-28 MED ORDER — AZITHROMYCIN 250 MG PO TABS: ORAL_TABLET | ORAL | 0 refills | Status: AC

## 2018-01-28 MED ORDER — HYDROCODONE-HOMATROPINE 5-1.5 MG/5ML PO SYRP: 5.0000 mL | ORAL_SOLUTION | Freq: Every evening | ORAL | 0 refills | Status: DC | PRN

## 2018-01-28 NOTE — Progress Notes (Signed)
   Subjective:    Patient ID: Laura [name], female    DOB: [birth year], 47 y.o.   MRN: [id number]  HPI 47 year old female comes in today complaining of cough and intermittent fever that started  a week ago.  In fact she went to urgent care on June 16 and had been sick for 3 days at that point.  It was felt to be viral so over-the-counter medications were recommended.  Cough is mostly dry but occasionally productive. Last fever was 2 nights ago.  She has been using Dayquail.  She feels shaky.  Hx of pneumonia in the past. She has had a lot of sinus congestion a nd drainage that is thick. + HA and pressure.  Cough keeping her up at night.    Review of Systems     Objective:   Physical Exam  Constitutional: She is oriented to person, place, and time. She appears well-developed and well-nourished.  HENT:  Head: Normocephalic and atraumatic.  Right Ear: External ear normal.  Left Ear: External ear normal.  Nose: Nose normal.  Mouth/Throat: Oropharynx is clear and moist.  TMs and canals are clear.   Eyes: Pupils are equal, round, and reactive to light. Conjunctivae and EOM are normal.  Neck: Neck supple. No thyromegaly present.  Cardiovascular: Normal rate, regular rhythm and normal heart sounds.  Pulmonary/Chest: Effort normal and breath sounds normal. She has no wheezes.  Lymphadenopathy:    She has no cervical adenopathy.  Neurological: She is alert and oriented to person, place, and time.  Skin: Skin is warm and dry.  Psychiatric: She has a normal mood and affect.          Assessment & Plan:  Sinusitis, acute - will tx withe zpack and pt request cough med at bedtime.  Continue to hydrate and okay to use over-the-counter medications.  If not feeling significantly better after a week and then please let me know.

## 2018-03-19 ENCOUNTER — Other Ambulatory Visit: Payer: Self-pay | Admitting: Sports Medicine

## 2018-03-19 DIAGNOSIS — R1013 Epigastric pain: Secondary | ICD-10-CM

## 2018-06-24 ENCOUNTER — Encounter: Payer: Self-pay | Admitting: Family Medicine

## 2018-06-24 ENCOUNTER — Ambulatory Visit (INDEPENDENT_AMBULATORY_CARE_PROVIDER_SITE_OTHER): Payer: Managed Care, Other (non HMO) | Admitting: Family Medicine

## 2018-06-24 VITALS — BP 129/58 | HR 65 | Ht 63.43 in | Wt 257.0 lb

## 2018-06-24 DIAGNOSIS — Z1211 Encounter for screening for malignant neoplasm of colon: Secondary | ICD-10-CM

## 2018-06-24 DIAGNOSIS — E282 Polycystic ovarian syndrome: Secondary | ICD-10-CM | POA: Diagnosis not present

## 2018-06-24 DIAGNOSIS — Z0000 Encounter for general adult medical examination without abnormal findings: Secondary | ICD-10-CM | POA: Diagnosis not present

## 2018-06-24 DIAGNOSIS — F4321 Adjustment disorder with depressed mood: Secondary | ICD-10-CM

## 2018-06-24 DIAGNOSIS — Z1231 Encounter for screening mammogram for malignant neoplasm of breast: Secondary | ICD-10-CM | POA: Diagnosis not present

## 2018-06-24 MED ORDER — ESCITALOPRAM OXALATE 10 MG PO TABS: ORAL_TABLET | ORAL | 1 refills | Status: DC

## 2018-06-24 NOTE — Patient Instructions (Signed)

## 2018-06-24 NOTE — Progress Notes (Signed)
Subjective:     Laura [name] is a 47 y.o. female and is here for a comprehensive physical exam. The patient reports problems - feeling depressed.  Her OB/GYN appointment is scheduled for Monday at [facility].  She has been feeling more sad and down since her father passed away in 01/20/2023.  Her daughters went off to college this fall and so she is having a little bit of emptiness syndrome and she is been having some marital problems.  She would like to consider starting medication to help with her mood.  She says it almost feels a little bit like when she had postpartum depression years ago.  She was placed on Lexapro at that time and did well and was just on it for short period of time.  Social History   Socioeconomic History  . Marital status: Married    Spouse name: matthew  . Number of children: 5  . Years of education: Not on file  . Highest education level: Not on file  Occupational History  . Occupation: Surveyor, quantity: [employer]  . Financial resource strain: Not on file  . Food insecurity:    Worry: Not on file    Inability: Not on file  . Transportation needs:    Medical: Not on file    Non-medical: Not on file  Tobacco Use  . Smoking status: Former Smoker    Last attempt to quit: 07/15/2002    Years since quitting: 15.9  . Smokeless tobacco: Never Used  Substance and Sexual Activity  . Alcohol use: No    Alcohol/week: 0.0 standard drinks  . Drug use: No  . Sexual activity: Yes    Partners: Male  Lifestyle  . Physical activity:    Days per week: Not on file    Minutes per session: Not on file  . Stress: Not on file  Relationships  . Social connections:    Talks on phone: Not on file    Gets together: Not on file    Attends religious service: Not on file    Active member of club or organization: Not on file    Attends meetings of clubs or organizations: Not on file    Relationship status: Not on file  . Intimate partner  violence:    Fear of current or ex partner: Not on file    Emotionally abused: Not on file    Physically abused: Not on file    Forced sexual activity: Not on file  Other Topics Concern  . Not on file  Social History Narrative   Some exercise.  In bariatric program at [facility].    Health Maintenance  Topic Date Due  . HIV Screening  03/05/1985  . PAP SMEAR  01/22/2020  . TETANUS/TDAP  06/19/2024  . INFLUENZA VACCINE  Completed    The following portions of the patient's history were reviewed and updated as appropriate: allergies, current medications, past family history, past medical history, past social history, past surgical history and problem list.  Review of Systems A comprehensive review of systems was negative.   Objective:    BP (!) 129/58   Pulse 65   Ht 5' 3.43" (1.611 m)   Wt 257 lb (116.6 kg)   SpO2 100%   BMI 44.92 kg/m  General appearance: alert, cooperative and appears stated age Head: Normocephalic, without obvious abnormality, atraumatic Eyes: conj clear .EOMI, PEERLA Ears: normal TM's and external ear canals both ears  Nose: Nares normal. Septum midline. Mucosa normal. No drainage or sinus tenderness. Throat: lips, mucosa, and tongue normal; teeth and gums normal Neck: no adenopathy, no carotid bruit, no JVD, supple, symmetrical, trachea midline and thyroid not enlarged, symmetric, no tenderness/mass/nodules Back: symmetric, no curvature. ROM normal. No CVA tenderness. Lungs: clear to auscultation bilaterally Heart: regular rate and rhythm, S1, S2 normal, no murmur, click, rub or gallop Abdomen: soft, non-tender; bowel sounds normal; no masses,  no organomegaly Extremities: extremities normal, atraumatic, no cyanosis or edema Pulses: 2+ and symmetric Skin: Skin color, texture, turgor normal. No rashes or lesions Lymph nodes: Cervical, supraclavicular, and axillary nodes normal. Neurologic: Alert and oriented X 3, normal strength and tone. Normal symmetric  reflexes. Normal coordination and gait    Assessment:    Healthy female exam.      Plan:     See After Visit Summary for Counseling Recommendations   Keep up a regular exercise program and make sure you are eating a healthy diet Try to eat 4 servings of dairy a day, or if you are lactose intolerant take a calcium with vitamin D daily.  Your vaccines are up to date.  She has her OB/GYN appointment next week. Mammogram order placed. Referral to GI placed for colon cancer screening.  She is due for her repeat because of family history.  Depression, acute situational- PHQ 9 score of 8 and gad 7 score of 6.  We discussed options.  Will start Lexapro.  Follow-up in 3 to 4 weeks.  Discussed potential side effects.

## 2018-07-16 ENCOUNTER — Other Ambulatory Visit: Payer: Self-pay | Admitting: Family Medicine

## 2018-07-16 DIAGNOSIS — F4321 Adjustment disorder with depressed mood: Secondary | ICD-10-CM

## 2018-07-23 ENCOUNTER — Encounter: Payer: Self-pay | Admitting: Family Medicine

## 2018-07-23 ENCOUNTER — Ambulatory Visit (INDEPENDENT_AMBULATORY_CARE_PROVIDER_SITE_OTHER): Payer: Managed Care, Other (non HMO) | Admitting: Family Medicine

## 2018-07-23 VITALS — BP 124/62 | HR 56 | Ht 63.0 in | Wt 255.0 lb

## 2018-07-23 DIAGNOSIS — Z6841 Body Mass Index (BMI) 40.0 and over, adult: Secondary | ICD-10-CM

## 2018-07-23 DIAGNOSIS — L309 Dermatitis, unspecified: Secondary | ICD-10-CM

## 2018-07-23 DIAGNOSIS — Z9989 Dependence on other enabling machines and devices: Secondary | ICD-10-CM

## 2018-07-23 DIAGNOSIS — F4321 Adjustment disorder with depressed mood: Secondary | ICD-10-CM | POA: Diagnosis not present

## 2018-07-23 DIAGNOSIS — Z1231 Encounter for screening mammogram for malignant neoplasm of breast: Secondary | ICD-10-CM | POA: Diagnosis not present

## 2018-07-23 MED ORDER — CLOBETASOL PROPIONATE 0.05 % EX OINT: 1.0000 "application " | TOPICAL_OINTMENT | Freq: Two times a day (BID) | CUTANEOUS | 1 refills | Status: DC

## 2018-07-23 MED ORDER — ESCITALOPRAM OXALATE 10 MG PO TABS: 10.0000 mg | ORAL_TABLET | Freq: Every day | ORAL | 1 refills | Status: DC

## 2018-07-23 NOTE — Progress Notes (Signed)
Subjective:    CC: Obesity  HPI:  Morbid obesity-she has been going to [facility] health bariatric clinic and is being followed by Dr. Loyal Gambler. She is doing well. On vitamin D daily.    She is also here today to follow-up for mood-when I last saw her in November for her physical she been feeling a little bit more sad and down since her father had passed away and her daughters had gone to college.  A little bit of emptiness and loneliness and is also been unfortunately having some marital problems.  We decided to start Lexapro which she had taken previously in the past.  She is here today for a 4-week follow-up.  Wanted to discuss her skin particularly on her right hand compared to her left.  She says it gets very dry and itchy to the point where she will run it under real hot water at night because it makes it feel better.  She does use moisturizers and lotion and has some old clobetasol cream that has expired that she uses periodically.  She says after using it for couple days she does notice significant improvement but does not like it to use it too often because it does then her skin.  To get splitting and cracking of the fingertips and has some dystrophic nails on the right hand from inflammation of the cuticles [place]   BP 124/62   Pulse (!) 56   Ht 5\' 3"  (1.6 m)   Wt 255 lb (115.7 kg)   SpO2 100%   BMI 45.17 kg/m     Allergies  Allergen Reactions  . Morphine Itching  . Neomycin   . Penicillins Hives    Past Medical History:  Diagnosis Date  . Allergic rhinitis   . Dyslipidemia   . Dysmenorrhea   . Gall bladder disease   . Infertility, female   . Obesity   . Polycystic ovary disease   . 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 LAPAROSCOPIC CHOLECYSTECTOMY    . SLEEVE GASTROPLASTY    . SUPRAVENTRICULAR TACHYCARDIA ABLATION N/A 07/23/2011   Procedure: SUPRAVENTRICULAR TACHYCARDIA ABLATION;  Surgeon: Evans Lance,  MD;  Location: [facility] CATH LAB;  Service: Cardiovascular;  Laterality: N/A;  . transthoracic echcardiogram  09/15/06    Social History   Socioeconomic History  . Marital status: Married    Spouse name: matthew  . Number of children: 5  . Years of education: Not on file  . Highest education level: Not on file  Occupational History  . Occupation: Surveyor, quantity: [employer]  . Financial resource strain: Not on file  . Food insecurity:    Worry: Not on file    Inability: Not on file  . Transportation needs:    Medical: Not on file    Non-medical: Not on file  Tobacco Use  . Smoking status: Former Smoker    Last attempt to quit: 07/15/2002    Years since quitting: 16.0  . Smokeless tobacco: Never Used  Substance and Sexual Activity  . Alcohol use: No    Alcohol/week: 0.0 standard drinks  . Drug use: No  . Sexual activity: Yes    Partners: Male  Lifestyle  . Physical activity:    Days per week: Not on file    Minutes per session: Not on file  . Stress: Not on file  Relationships  .  Social connections:    Talks on phone: Not on file    Gets together: Not on file    Attends religious service: Not on file    Active member of club or organization: Not on file    Attends meetings of clubs or organizations: Not on file    Relationship status: Not on file  . Intimate partner violence:    Fear of current or ex partner: Not on file    Emotionally abused: Not on file    Physically abused: Not on file    Forced sexual activity: Not on file  Other Topics Concern  . Not on file  Social History Narrative   Some exercise.  In bariatric program at [facility].     Family History  Problem Relation Age of Onset  . Colon cancer Mother 66  . Breast cancer Mother   . Diabetes Mother   . Heart attack Mother        AMI, in late 21's  . Depression Mother   . Heart disease Mother   . Hypertension Father   . Thyroid disease Brother   . Thyroid disease  Sister     Outpatient Encounter Medications as of 07/23/2018  Medication Sig  . BIOTIN 5000 PO Take 1 capsule by mouth.  . Cholecalciferol 50 MCG (2000 UT) TABS Take 1 tablet by mouth daily.  . clobetasol ointment (TEMOVATE) 3.24 % Apply 1 application topically 2 (two) times daily.  Marland Kitchen escitalopram (LEXAPRO) 10 MG tablet Take 1 tablet (10 mg total) by mouth daily. 1/2 tab po QD x 6 days, the increase to whole tab daily  . ipratropium (ATROVENT) 0.06 % nasal spray Place 2 sprays into both nostrils 4 (four) times daily.  . iron polysaccharides (NIFEREX) 150 MG capsule Take by mouth.  . Multiple Vitamin (THERA) TABS Take by mouth.  . Vitamin D, Ergocalciferol, (DRISDOL) 50000 UNITS CAPS capsule Reported on 08/14/2015  . [DISCONTINUED] escitalopram (LEXAPRO) 10 MG tablet 1/2 tab po QD x 6 days, the increase to whole tab daily   No facility-administered encounter medications on file as of 07/23/2018.         Objective:    General: Well Developed, well nourished, and in no acute distress.  Neuro: Alert and oriented x3, extra-ocular muscles intact, sensation grossly intact.  HEENT: Normocephalic, atraumatic  Skin: Warm and dry, no rashes.  On her right hand in particular she has a lot of very dry cracked skin especially around the fingertips.  Her cuticles are little bit inflamed.  But no significant erythema drainage or sign of abscess or pus.  She has significant deformity of most of her nails on her right hand but her left hand actually looks pretty good. Cardiac: Regular rate and rhythm, no murmurs rubs or gallops, no lower extremity edema.  Respiratory: Clear to auscultation bilaterally. Not using accessory muscles, speaking in full sentences.   Impression and Recommendations:   Acute, situational depression - she is doing well on Lexapro. She is happy with current regimen.  We will continue her 10 mg dose.  She is not experiencing any concerning side effects.  Follow-up in 3  months.  Morbid obesity -  She is doing well. Still following with Dr. Valetta Close.   Her sleep apnea has resolved and she is now off of CPAP.  Eczema.  We discussed options.  We will send over new prescription for clobetasol.  The ointment that she is using has expired several years ago.  She knows  not to use it for extended periods of time as it does cause skin thinning.  Also discussed the importance of moisturizing really well and using creams instead of lotions and even trying things like Chapstick which can work really well.

## 2018-07-28 ENCOUNTER — Ambulatory Visit (INDEPENDENT_AMBULATORY_CARE_PROVIDER_SITE_OTHER): Payer: Managed Care, Other (non HMO)

## 2018-07-28 DIAGNOSIS — Z1231 Encounter for screening mammogram for malignant neoplasm of breast: Secondary | ICD-10-CM | POA: Diagnosis not present

## 2018-08-06 LAB — HM COLONOSCOPY

## 2018-08-10 ENCOUNTER — Encounter: Payer: Self-pay | Admitting: Physician Assistant

## 2018-08-10 ENCOUNTER — Ambulatory Visit (INDEPENDENT_AMBULATORY_CARE_PROVIDER_SITE_OTHER): Payer: Managed Care, Other (non HMO) | Admitting: Physician Assistant

## 2018-08-10 VITALS — BP 115/73 | HR 61 | Temp 97.9°F | Wt 252.0 lb

## 2018-08-10 DIAGNOSIS — J0110 Acute frontal sinusitis, unspecified: Secondary | ICD-10-CM

## 2018-08-10 MED ORDER — IPRATROPIUM BROMIDE 0.06 % NA SOLN: 2.0000 | Freq: Four times a day (QID) | NASAL | 1 refills | Status: DC

## 2018-08-10 MED ORDER — DOXYCYCLINE HYCLATE 100 MG PO TABS: 100.0000 mg | ORAL_TABLET | Freq: Two times a day (BID) | ORAL | 0 refills | Status: AC

## 2018-08-10 NOTE — Progress Notes (Signed)
HPI:                                                                Laura [name] is a 47 y.o. female who presents to [facility] today for URI symptoms  URI   This is a new problem. The current episode started 1 to 4 weeks ago (x 10 days). The problem has been unchanged. There has been no fever. Associated symptoms include congestion, coughing (nonproductive), headaches, a plugged ear sensation and a sore throat (worse in the morning). Pertinent negatives include no chest pain, rash or wheezing. She has tried decongestant and acetaminophen (Dayquil) for the symptoms.       Past Medical History:  Diagnosis Date  . Allergic rhinitis   . Dyslipidemia   . Dysmenorrhea   . Gall bladder disease   . Infertility, female   . Obesity   . Polycystic ovary disease   . 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 LAPAROSCOPIC CHOLECYSTECTOMY    . SLEEVE GASTROPLASTY    . SUPRAVENTRICULAR TACHYCARDIA ABLATION N/A 07/23/2011   Procedure: SUPRAVENTRICULAR TACHYCARDIA ABLATION;  Surgeon: Evans Lance, MD;  Location: [facility] CATH [facility];  Service: Cardiovascular;  Laterality: N/A;  . transthoracic echcardiogram  09/15/06   Social History   Tobacco Use  . Smoking status: Former Smoker    Last attempt to quit: 07/15/2002    Years since quitting: 16.0  . Smokeless tobacco: Never Used  Substance Use Topics  . Alcohol use: No    Alcohol/week: 0.0 standard drinks   family history includes Breast cancer in her mother; Colon cancer (age of onset: 75) in her mother; Depression in her mother; Diabetes in her mother; Heart attack in her mother; Heart disease in her mother; Hypertension in her father; Thyroid disease in her brother and sister.    ROS: negative except as noted in the HPI  Medications: Current Outpatient Medications  Medication Sig Dispense Refill  . BIOTIN 5000 PO Take 1 capsule by  mouth.    . Cholecalciferol 50 MCG (2000 UT) TABS Take 1 tablet by mouth daily.    . clobetasol ointment (TEMOVATE) 6.60 % Apply 1 application topically 2 (two) times daily. 60 g 1  . escitalopram (LEXAPRO) 10 MG tablet Take 1 tablet (10 mg total) by mouth daily. 1/2 tab po QD x 6 days, the increase to whole tab daily 90 tablet 1  . ipratropium (ATROVENT) 0.06 % nasal spray Place 2 sprays into both nostrils 4 (four) times daily.    . iron polysaccharides (NIFEREX) 150 MG capsule Take by mouth.    . Multiple Vitamin (THERA) TABS Take by mouth.    . Vitamin D, Ergocalciferol, (DRISDOL) 50000 UNITS CAPS capsule Reported on 08/14/2015  0   No current facility-administered medications for this visit.    Allergies  Allergen Reactions  . Morphine Itching  . Neomycin   . Penicillins Hives       Objective:  BP 115/73   Pulse 61   Temp 97.9 F (36.6 C) (Oral)   Wt 252 lb (114.3 kg)   SpO2 99%   BMI 44.64 kg/m  Gen:  alert, not ill-appearing, no distress,  appropriate for age 4: head normocephalic without obvious abnormality, conjunctiva and cornea clear, right TM with air-fluid level, left TM clear, there is frontal sinus tenderness, nasal mucosa edematous, oropharynx clear, tonsils absent, uvula midline, neck supple, no cervical adenopathy trachea midline Pulm: Normal work of breathing, normal phonation, clear to auscultation bilaterally, no wheezes, rales or rhonchi CV: Normal rate, regular rhythm, s1 and s2 distinct, no murmurs, clicks or rubs  Neuro: alert and oriented x 3, no tremor MSK: extremities atraumatic, normal gait and station Skin: intact, no rashes on exposed skin, no jaundice, no cyanosis    No results found for this or any previous visit (from the past 72 hour(s)). No results found.    Assessment and Plan: 47 y.o. female with   .[place] was seen today for uri.  Diagnoses and all orders for this visit:  Acute non-recurrent frontal sinusitis -      doxycycline (VIBRA-TABS) 100 MG tablet; Take 1 tablet (100 mg total) by mouth 2 (two) times daily for 7 days. -     ipratropium (ATROVENT) 0.06 % nasal spray; Place 2 sprays into both nostrils 4 (four) times daily.  Afebrile, no tachypnea, no tachycardia, pulse ox 99% on room air at rest, no adventitious lung sounds Given persistent symptoms for 10 days we will treat empirically for bacterial sinusitis Patient declined prescription cough suppressant Counseled on nasal toileting and supportive care  Patient education and anticipatory guidance given Patient agrees with treatment plan Follow-up as needed if symptoms worsen or fail to improve  Darlyne Russian PA-C

## 2018-08-10 NOTE — Patient Instructions (Addendum)
For nasal symptoms/sinusitis: - nasal saline rinses / netti pot several times per day (do this prior to nasal spray) - prescription Atrovent nasal spray: 2 sprays each nostril, up to 4 times per day as needed - you can use OTC nasal decongestant sprays like Afrin (oxymetazoline) BUT do not use for more than 3 days as it will cause worsening congestion/nasal symptoms) - warm facial compresses - oral decongestants and antihistamines like Claritin-D and Zyrtec-D may help dry up secretions (caution using decongestants if you have high blood pressure, heart disease or kidney disease) - for sinus headache: Tylenol 1000mg  every 8 hours as needed. Alternate with Ibuprofen 600mg  every 6 hours  For sore throat: - Tylenol 1000mg  every 8 hours as needed for throat pain. Alternate with Ibuprofen 600mg  every 6 hours - Cepacol throat lozenges and/or Chloraseptic spray - Warm salt water gargles  For cough: - Cough is a protective mechanism and an important part of fighting an infection. I encourage you to avoid suppressing your cough with medication during the day if possible.  - Mucinex with at least 8 oz. of water can loosen chest congestion and make cough more productive, which means you will actually cough less - Okay to use a cough suppressant at bedtime in order to rest (Nyquil, Delsym, Robitussin, etc.)  Note: follow package instructions for all over-the-counter medications. If using multi-symptom medications (Dayquil, Theraflu, etc.), check the label for duplicate drug ingredients.   Sinusitis, Adult Sinusitis is inflammation of your sinuses. Sinuses are hollow spaces in the bones around your face. Your sinuses are located:  Around your eyes.  In the middle of your forehead.  Behind your nose.  In your cheekbones. Mucus normally drains out of your sinuses. When your nasal tissues become inflamed or swollen, mucus can become trapped or blocked. This allows bacteria, viruses, and fungi to grow,  which leads to infection. Most infections of the sinuses are caused by a virus. Sinusitis can develop quickly. It can last for up to 4 weeks (acute) or for more than 12 weeks (chronic). Sinusitis often develops after a cold. What are the causes? This condition is caused by anything that creates swelling in the sinuses or stops mucus from draining. This includes:  Allergies.  Asthma.  Infection from bacteria or viruses.  Deformities or blockages in your nose or sinuses.  Abnormal growths in the nose (nasal polyps).  Pollutants, such as chemicals or irritants in the air.  Infection from fungi (rare). What increases the risk? You are more likely to develop this condition if you:  Have a weak body defense system (immune system).  Do a lot of swimming or diving.  Overuse nasal sprays.  Smoke. What are the signs or symptoms? The main symptoms of this condition are pain and a feeling of pressure around the affected sinuses. Other symptoms include:  Stuffy nose or congestion.  Thick drainage from your nose.  Swelling and warmth over the affected sinuses.  Headache.  Upper toothache.  A cough that may get worse at night.  Extra mucus that collects in the throat or the back of the nose (postnasal drip).  Decreased sense of smell and taste.  Fatigue.  A fever.  Sore throat.  Bad breath. How is this diagnosed? This condition is diagnosed based on:  Your symptoms.  Your medical history.  A physical exam.  Tests to find out if your condition is acute or chronic. This may include: ? Checking your nose for nasal polyps. ? Viewing your  sinuses using a device that has a light (endoscope). ? Testing for allergies or bacteria. ? Imaging tests, such as an MRI or CT scan. In rare cases, a bone biopsy may be done to rule out more serious types of fungal sinus disease. How is this treated? Treatment for sinusitis depends on the cause and whether your condition is chronic  or acute.  If caused by a virus, your symptoms should go away on their own within 10 days. You may be given medicines to relieve symptoms. They include: ? Medicines that shrink swollen nasal passages (topical intranasal decongestants). ? Medicines that treat allergies (antihistamines). ? A spray that eases inflammation of the nostrils (topical intranasal corticosteroids). ? Rinses that help get rid of thick mucus in your nose (nasal saline washes).  If caused by bacteria, your health care provider may recommend waiting to see if your symptoms improve. Most bacterial infections will get better without antibiotic medicine. You may be given antibiotics if you have: ? A severe infection. ? A weak immune system.  If caused by narrow nasal passages or nasal polyps, you may need to have surgery. Follow these instructions at home: Medicines  Take, use, or apply over-the-counter and prescription medicines only as told by your health care provider. These may include nasal sprays.  If you were prescribed an antibiotic medicine, take it as told by your health care provider. Do not stop taking the antibiotic even if you start to feel better. Hydrate and humidify   Drink enough fluid to keep your urine pale yellow. Staying hydrated will help to thin your mucus.  Use a cool mist humidifier to keep the humidity level in your home above 50%.  Inhale steam for 10-15 minutes, 3-4 times a day, or as told by your health care provider. You can do this in the bathroom while a hot shower is running.  Limit your exposure to cool or dry air. Rest  Rest as much as possible.  Sleep with your head raised (elevated).  Make sure you get enough sleep each night. General instructions   Apply a warm, moist washcloth to your face 3-4 times a day or as told by your health care provider. This will help with discomfort.  Wash your hands often with soap and water to reduce your exposure to germs. If soap and water  are not available, use hand sanitizer.  Do not smoke. Avoid being around people who are smoking (secondhand smoke).  Keep all follow-up visits as told by your health care provider. This is important. Contact a health care provider if:  You have a fever.  Your symptoms get worse.  Your symptoms do not improve within 10 days. Get help right away if:  You have a severe headache.  You have persistent vomiting.  You have severe pain or swelling around your face or eyes.  You have vision problems.  You develop confusion.  Your neck is stiff.  You have trouble breathing. Summary  Sinusitis is soreness and inflammation of your sinuses. Sinuses are hollow spaces in the bones around your face.  This condition is caused by nasal tissues that become inflamed or swollen. The swelling traps or blocks the flow of mucus. This allows bacteria, viruses, and fungi to grow, which leads to infection.  If you were prescribed an antibiotic medicine, take it as told by your health care provider. Do not stop taking the antibiotic even if you start to feel better.  Keep all follow-up visits as told by  your health care provider. This is important. This information is not intended to replace advice given to you by your health care provider. Make sure you discuss any questions you have with your health care provider. Document Released: 07/28/2005 Document Revised: 12/28/2017 Document Reviewed: 12/28/2017 Elsevier Interactive Patient Education  2019 [employer].

## 2018-08-12 ENCOUNTER — Encounter: Payer: Self-pay | Admitting: Family Medicine

## 2018-08-19 ENCOUNTER — Encounter: Payer: Self-pay | Admitting: Family Medicine

## 2018-10-22 ENCOUNTER — Other Ambulatory Visit: Payer: Self-pay

## 2018-10-22 ENCOUNTER — Encounter: Payer: Self-pay | Admitting: Family Medicine

## 2018-10-22 ENCOUNTER — Ambulatory Visit (INDEPENDENT_AMBULATORY_CARE_PROVIDER_SITE_OTHER): Payer: Managed Care, Other (non HMO) | Admitting: Family Medicine

## 2018-10-22 VITALS — BP 110/52 | HR 56 | Ht 63.0 in | Wt 257.0 lb

## 2018-10-22 DIAGNOSIS — M958 Other specified acquired deformities of musculoskeletal system: Secondary | ICD-10-CM

## 2018-10-22 DIAGNOSIS — F4321 Adjustment disorder with depressed mood: Secondary | ICD-10-CM | POA: Diagnosis not present

## 2018-10-22 DIAGNOSIS — N950 Postmenopausal bleeding: Secondary | ICD-10-CM

## 2018-10-22 MED ORDER — ESCITALOPRAM OXALATE 10 MG PO TABS: 10.0000 mg | ORAL_TABLET | Freq: Every day | ORAL | 1 refills | Status: DC

## 2018-10-22 NOTE — Progress Notes (Signed)
Subjective:    CC:   HPI: 48 year old female is here today to follow-up for major depression, single episode-she is seeing psychiatry over at [place].  She is currently taking Lexapro 10 mg daily. She is been with her regimen and feels like it is effective she denies any significant side effects or problems.    Also wanted to let me know that after not having had her period for a little over a year she recently went on a trip out of town and then in February actually had her period again.  She had Dr. Valetta Close do some labs.  Her FSH and LH were in the postmenopausal range.  She is not had any recurrent bleeding since then.  She follows with Fenton Malling over at [place].  She also wanted me to check her abdomen out.  She feels like she may have an abdominal wall hernia above the umbilicus.  She had a vertical incision when she had quadruplets.  She has been trying to exercise more recently she has not been doing anything like crunches but just notices in the shower that it feels like it is bulging a little bit more specially just right of the midline.  Past medical history, Surgical history, Family history not pertinant except as noted below, Social history, Allergies, and medications have been entered into the medical record, reviewed, and corrections made.   Review of Systems: No fevers, chills, night sweats, weight loss, chest pain, or shortness of breath.   Objective:    General: Well Developed, well nourished, and in no acute distress.  Neuro: Alert and oriented x3, extra-ocular muscles intact, sensation grossly intact.  HEENT: Normocephalic, atraumatic  Skin: Warm and dry, no rashes. Cardiac: Regular rate and rhythm, no murmurs rubs or gallops, no lower extremity edema.  Respiratory: Clear to auscultation bilaterally. Not using accessory muscles, speaking in full sentences. Abd:, Nontender.  I do not feel a discrete defect or hole in the wall but I do feel like there is a more  thinned weakened area.   Impression and Recommendations:   Major Depression -continuing visits with psychiatry.  We will continue with 10 mg of Lexapro.  Refill sent to pharmacy.  I will see her back in 6 months or sooner if she is having any problems.  Postmenopausal bleeding-I did encourage her to call [facility] and get an appointment.  I think based on the history that she is describing and recent lab work this is probably be considered is postmenopausal bleeding and needs to be worked up further with possible ultrasound and maybe even endometrial biopsy.  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

## 2018-12-20 ENCOUNTER — Other Ambulatory Visit: Payer: Self-pay

## 2018-12-20 ENCOUNTER — Encounter: Payer: Self-pay | Admitting: Sports Medicine

## 2018-12-20 ENCOUNTER — Ambulatory Visit (INDEPENDENT_AMBULATORY_CARE_PROVIDER_SITE_OTHER): Payer: Managed Care, Other (non HMO)

## 2018-12-20 ENCOUNTER — Ambulatory Visit (INDEPENDENT_AMBULATORY_CARE_PROVIDER_SITE_OTHER): Payer: Managed Care, Other (non HMO) | Admitting: Sports Medicine

## 2018-12-20 DIAGNOSIS — M79671 Pain in right foot: Secondary | ICD-10-CM | POA: Diagnosis not present

## 2018-12-20 DIAGNOSIS — M25461 Effusion, right knee: Secondary | ICD-10-CM

## 2018-12-20 DIAGNOSIS — M25561 Pain in right knee: Secondary | ICD-10-CM

## 2018-12-20 MED ORDER — NITROGLYCERIN 0.2 MG/HR TD PT24: MEDICATED_PATCH | TRANSDERMAL | 11 refills | Status: DC

## 2018-12-20 NOTE — Assessment & Plan Note (Signed)
Aspiration with crystal analysis, injection. X-rays.

## 2018-12-20 NOTE — Progress Notes (Signed)
Subjective:    CC: Right knee pain, right heel pain  HPI: 4 weeks this pleasant 48 year old female has had pain that she localizes on the posterior aspect of her right heel, worse with weightbearing, no trauma, no change in activities, symptoms are severe, persistent, localized without radiation.  In addition she is noted increasing swelling in her right knee, no mechanical symptoms, no trauma.  The swelling developed overnight, and it was hot and red.  I reviewed the past medical history, family history, social history, surgical history, and allergies today and no changes were needed.  Please see the problem list section below in epic for further details.  Past Medical History: Past Medical History:  Diagnosis Date  . Allergic rhinitis   . Dyslipidemia   . Dysmenorrhea   . Gall bladder disease   . Infertility, female   . Obesity   . Polycystic ovary disease   . 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 LAPAROSCOPIC CHOLECYSTECTOMY    . SLEEVE GASTROPLASTY    . SUPRAVENTRICULAR TACHYCARDIA ABLATION N/A 07/23/2011   Procedure: SUPRAVENTRICULAR TACHYCARDIA ABLATION;  Surgeon: Evans Lance, MD;  Location: [facility] CATH LAB;  Service: Cardiovascular;  Laterality: N/A;  . transthoracic echcardiogram  09/15/06   Social History: Social History   Socioeconomic History  . Marital status: Married    Spouse name: matthew  . Number of children: 5  . Years of education: Not on file  . Highest education level: Not on file  Occupational History  . Occupation: Surveyor, quantity: [employer]  . Financial resource strain: Not on file  . Food insecurity:    Worry: Not on file    Inability: Not on file  . Transportation needs:    Medical: Not on file    Non-medical: Not on file  Tobacco Use  . Smoking status: Former Smoker    Last attempt to quit: 07/15/2002    Years since  quitting: 16.4  . Smokeless tobacco: Never Used  Substance and Sexual Activity  . Alcohol use: No    Alcohol/week: 0.0 standard drinks  . Drug use: No  . Sexual activity: Yes    Partners: Male  Lifestyle  . Physical activity:    Days per week: Not on file    Minutes per session: Not on file  . Stress: Not on file  Relationships  . Social connections:    Talks on phone: Not on file    Gets together: Not on file    Attends religious service: Not on file    Active member of club or organization: Not on file    Attends meetings of clubs or organizations: Not on file    Relationship status: Not on file  Other Topics Concern  . Not on file  Social History Narrative   Some exercise.  In bariatric program at [facility].    Family History: Family History  Problem Relation Age of Onset  . Colon cancer Mother 69  . Breast cancer Mother   . Diabetes Mother   . Heart attack Mother        AMI, in late 2's  . Depression Mother   . Heart disease Mother   . Hypertension Father   . Thyroid disease Brother   . Thyroid disease Sister    Allergies: Allergies  Allergen Reactions  . Morphine Itching  . Neomycin   .  Penicillins Hives   Medications: See med rec.  Review of Systems: No fevers, chills, night sweats, weight loss, chest pain, or shortness of breath.   Objective:    General: Well Developed, well nourished, and in no acute distress.  Neuro: Alert and oriented x3, extra-ocular muscles intact, sensation grossly intact.  HEENT: Normocephalic, atraumatic, pupils equal round reactive to light, neck supple, no masses, no lymphadenopathy, thyroid nonpalpable.  Skin: Warm and dry, no rashes. Cardiac: Regular rate and rhythm, no murmurs rubs or gallops, no lower extremity edema.  Respiratory: Clear to auscultation bilaterally. Not using accessory muscles, speaking in full sentences. Right foot: No visible erythema or swelling. Range of motion is full in all directions. Strength is  5/5 in all directions. No hallux valgus. No pes cavus or pes planus. No abnormal callus noted. No pain over the navicular prominence, or base of fifth metatarsal. No tenderness to palpation of the calcaneal insertion of plantar fascia. Severe tenderness at the Achilles insertion. No pain over the calcaneal bursa. No pain of the retrocalcaneal bursa. No tenderness to palpation over the tarsals, metatarsals, or phalanges. No hallux rigidus or limitus. No tende effusion. ROM normal in flexion and extension and lower leg rotation. Ligaments with solid consistent endpoints including ACL, PCL, LCL, MCL. Negative Mcmurray's and provocative meniscal tests. Non painful patellar compression. Patellar and quadriceps tendons unremarkable. Hamstring and quadriceps strength is normal.  Procedure: Real-time Ultrasound Guided  aspiration/injection of right knee Device: GE Logiq E  Verbal informed consent obtained.  Time-out conducted.  Noted no overlying erythema, induration, or other signs of local infection.  Skin prepped in a sterile fashion.  Local anesthesia: Topical Ethyl chloride.  With sterile technique and under real time ultrasound guidance:  Using a 18-gauge needle aspirated 20 cc of slightly cloudy, straw-colored fluid, syringe switched and 1 cc Kenalog 40, 2 cc lidocaine, 2 cc bupivacaine injected easily Completed without difficulty  Pain immediately resolved suggesting accurate placement of the medication.  Advised to call if fevers/chills, erythema, induration, drainage, or persistent bleeding.  Images permanently stored and available for review in the ultrasound unit.  Impression: Technically successful ultrasound guided injection.  Impression and Recommendations:    Pain and swelling of right knee Aspiration with crystal analysis, injection. X-rays.   Intractable right heel pain Insertional Achilles tendinopathy. Heel lift, cam boot. Topical nitroglycerin patches. Formal  physical therapy. Return to see me in 4 weeks for this.   ___________________________________________ Gwen Her. Dianah Field, M.D., ABFM., CAQSM. Primary Care and Sports Medicine [facility]  Adjunct Professor of [employer]

## 2018-12-20 NOTE — Assessment & Plan Note (Signed)
Insertional Achilles tendinopathy. Heel lift, cam boot. Topical nitroglycerin patches. Formal physical therapy. Return to see me in 4 weeks for this.

## 2018-12-21 LAB — SYNOVIAL FLUID, CRYSTAL

## 2018-12-28 ENCOUNTER — Other Ambulatory Visit: Payer: Self-pay

## 2018-12-28 ENCOUNTER — Ambulatory Visit (INDEPENDENT_AMBULATORY_CARE_PROVIDER_SITE_OTHER): Payer: Managed Care, Other (non HMO) | Admitting: Rehabilitative and Restorative Service Providers"

## 2018-12-28 ENCOUNTER — Encounter: Payer: Self-pay | Admitting: Rehabilitative and Restorative Service Providers"

## 2018-12-28 DIAGNOSIS — M25571 Pain in right ankle and joints of right foot: Secondary | ICD-10-CM

## 2018-12-28 DIAGNOSIS — M6281 Muscle weakness (generalized): Secondary | ICD-10-CM

## 2018-12-28 DIAGNOSIS — R29898 Other symptoms and signs involving the musculoskeletal system: Secondary | ICD-10-CM

## 2018-12-28 DIAGNOSIS — R2689 Other abnormalities of gait and mobility: Secondary | ICD-10-CM | POA: Diagnosis not present

## 2018-12-28 NOTE — Patient Instructions (Signed)
Access Code: [email or web address]  URL: [email or web address]  Date: 12/28/2018  Prepared by: Gillermo Murdoch   Exercises  Seated Calf Stretch with Strap - 3 reps - 1 sets - 30 sec hold - 2x daily - 7x weekly  Seated Toe Raise - 3 reps - 1 sets - 30 sec hold - 2x daily - 7x weekly  Seated Ankle Inversion Eversion PROM - 3 reps - 1 sets - 30 sec hold - 2x daily - 7x weekly  Seated Figure 4 Ankle Inversion with Resistance - 3 reps - 1 sets - 30 sec hold - 2x daily - 7x weekly  Supine Ankle Circles - 3 reps - 1 sets - 30 sec hold - 2x daily - 7x weekly  Ankle Alphabet in Elevation - 3 reps - 1 sets - 30 sec hold - 2x daily - 7x weekly  Correct Standing Posture - 2x daily - 7x weekly

## 2018-12-28 NOTE — Therapy (Signed)
[place], [place], [address] Phone: [phone]   Fax:  [phone]  Physical Therapy Evaluation  Patient Details  Name: Laura [name] MRN: [id number] Date of Birth: 07-26-1971 Referring Provider (PT): Dr Dianah Field    Encounter Date: 12/28/2018  PT End of Session - 12/28/18 1153    Visit Number  1    Number of Visits  12    Date for PT Re-Evaluation  02/08/19    PT Start Time  1100    PT Stop Time  1157    PT Time Calculation (min)  57 min    Activity Tolerance  Patient tolerated treatment well       Past Medical History:  Diagnosis Date  . Allergic rhinitis   . Dyslipidemia   . Dysmenorrhea   . Gall bladder disease   . Infertility, female   . Obesity   . Polycystic ovary disease   . 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 LAPAROSCOPIC CHOLECYSTECTOMY    . SLEEVE GASTROPLASTY    . SUPRAVENTRICULAR TACHYCARDIA ABLATION N/A 07/23/2011   Procedure: SUPRAVENTRICULAR TACHYCARDIA ABLATION;  Surgeon: Evans Lance, MD;  Location: [facility] CATH LAB;  Service: Cardiovascular;  Laterality: N/A;  . transthoracic echcardiogram  09/15/06    There were no vitals filed for this visit.   Subjective Assessment - 12/28/18 1106    Subjective  Patient reports that she started having pain in the Rt ankle ~ 1-2 days after exercising at home - including burpees and planks. Pain has continued with increased pain with walking.     Pertinent History  obesity; c-sections x 2; gall bladder surgery; gastric sleeve ~ 3 yrs ago     Patient Stated Goals  get rid of the pain and walk normally     Currently in Pain?  Yes    Pain Score  4     Pain Location  Heel    Pain Orientation  Right    Pain Descriptors / Indicators  Sharp    Pain Type  Acute pain    Pain Radiating Towards  occasionally up the back of the calf     Pain Onset  1 to 4 weeks ago    Pain  Frequency  Intermittent    Aggravating Factors   activity; standing; walking    Pain Relieving Factors  boot off; ice          [facility] PT Assessment - 12/28/18 0001      Assessment   Medical Diagnosis  Rt Achille's tendinitis     Referring Provider (PT)  Dr Dianah Field     Onset Date/Surgical Date  12/12/18    Hand Dominance  Right    Next MD Visit  01/17/2019    Prior Therapy  none for ankle       Precautions   Precautions  None      Restrictions   Weight Bearing Restrictions  No      Balance Screen   Has the patient fallen in the past 6 months  No    Has the patient had a decrease in activity level because of a fear of falling?   No    Is the patient reluctant to leave their home because of a fear of falling?   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, computer     Leisure  household chore; exercise 2-3 times/wk recumbent bike; exercises at home d/t gym being closed        Observation/Other Assessments   Focus on Therapeutic Outcomes (FOTO)   60% limitation       Sensation   Additional Comments  WFL's       AROM   Right/Left Hip  --   WNL's bilat    Right/Left Knee  --   WNL's bilat    Right Ankle Dorsiflexion  4    Right Ankle Plantar Flexion  56    Left Ankle Dorsiflexion  10    Left Ankle Plantar Flexion  65      Strength   Right/Left Hip  --   Rt LE 4+/5; Lt 5/5    Right/Left Knee  --   Rt 5-/5; Lt 5/5    Right Ankle Dorsiflexion  4+/5    Right Ankle Plantar Flexion  3/5   NT in standing    Right Ankle Inversion  4+/5    Right Ankle Eversion  4+/5    Left Ankle Dorsiflexion  5/5    Left Ankle Plantar Flexion  4/5    Left Ankle Inversion  5/5    Left Ankle Eversion  5/5      Flexibility   Hamstrings  WFL's slightly tighter Rt     Quadriceps  ~95-100 deg bilat     ITB  tight  bilat     Piriformis  tight bilat       Palpation   Palpation comment  palpation through calcaneous; posterior calf       Ambulation/Gait   Gait Comments  ambulates with cam boot Rt LE; Rt hip in ER; limp on Rt LE with wt bearing Rt                 Objective measurements completed on examination: See above findings.      [place] Adult PT Treatment/Exercise - 12/28/18 0001      Vasopneumatic   Number Minutes Vasopneumatic   15 minutes    Vasopnuematic Location   Ankle   Rt   Vasopneumatic Pressure  Low    Vasopneumatic Temperature   34 deg       Ankle Exercises: Stretches   Gastroc Stretch  3 reps;30 seconds   long sit with strap    Other Stretch  stretch into inversion and eversion 3 reps x 30 sec w/ strap       Ankle Exercises: Standing   Other Standing Ankle Exercises  standing; weight shift side to side; weight shift in small stagger step 30-45 sec each position       Ankle Exercises: Seated   ABC's  1 rep    Ankle Circles/Pumps  AROM;Right;15 reps             PT Education - 12/28/18 1148    Education Details  HEP; ice vs heat; boot fit     Person(s) Educated  Patient    Methods  Explanation;Demonstration;Tactile cues;Verbal cues;Handout    Comprehension  Verbalized understanding;Returned demonstration;Verbal cues required;Tactile cues required          PT Long Term Goals - 12/28/18 1205      PT LONG TERM GOAL #1   Title  Increase AROM Rt anke  to equal Lt 02/08/2019    Time  6    Period  Weeks    Status  New      PT LONG TERM GOAL #2   Title  Increase strength Rt ankle to 5-/5 to 5/5 in all planes 02/08/2019    Baseline  -    Time  6    Period  Weeks    Status  New      PT LONG TERM GOAL #3   Title  Progress to ambulation without boot with normal gait pattern 02/08/2019    Time  6    Period  Weeks    Status  New      PT LONG TERM GOAL #4   Title  Independent in HEP 6/30/20202    Time  6    Period  Weeks    Status  New      PT  LONG TERM GOAL #5   Title  improve FOTO =/< 39% limitation 02/08/2019    Time  6    Period  Weeks    Status  New             Plan - 12/28/18 1147    Clinical Impression Statement  Patient presents with signs and symptoms consistent with Rt Achille's tendonitis. She has limited Rt ankle ROM and strength; pain with palpation Rt posterior calcaneous; tightness to palpation Rt posterior calf; abnormal gait pattern; pain; limited fucntional activity level.     Stability/Clinical Decision Making  Stable/Uncomplicated    Clinical Decision Making  Low    Rehab Potential  Good    PT Frequency  2x / week    PT Duration  6 weeks    PT Treatment/Interventions  Patient/family education;ADLs/Self Care Home Management;Cryotherapy;Electrical Stimulation;Iontophoresis 4mg /ml Dexamethasone;Moist Heat;Ultrasound;Dry needling;Manual techniques;Therapeutic activities;Therapeutic exercise;Balance training;Gait training;Neuromuscular re-education    PT Next Visit Plan  review HEP(pt reported soreness post treatment - no pain during exercises; progress with ROM and strength as tolerated; add Rt hip strengthening; manual work and modalities as indicated     PT Home Exercise Plan  Access Code: [email or web address]        Patient will benefit from skilled therapeutic intervention in order to improve the following deficits and impairments:  Postural dysfunction, Improper body mechanics, Pain, Increased muscle spasms, Decreased mobility, Decreased range of motion, Decreased strength, Decreased activity tolerance, Abnormal gait  Visit Diagnosis: Pain in right ankle and joints of right foot - Plan: PT plan of care cert/re-cert  Other symptoms and signs involving the musculoskeletal system - Plan: PT plan of care cert/re-cert  Muscle weakness (generalized) - Plan: PT plan of care cert/re-cert  Other abnormalities of gait and mobility - Plan: PT plan of care cert/re-cert     Problem List Patient Active Problem List    Diagnosis Date Noted  . Pain and swelling of right knee 12/20/2018  . Intractable right heel pain 12/20/2018  . Tendinitis of right hip flexor 12/14/2017  . Primary osteoarthritis of left hip 11/14/2016  . Trochanteric bursitis of both hips 10/17/2016  . Low back pain 09/05/2015  . Uterine fibroid 08/15/2015  . Menorrhagia with irregular cycle 08/14/2015  . Radiculitis of left cervical region 08/09/2015  . Post-traumatic osteoarthritis of left knee 02/15/2015  . History of bariatric surgery 02/06/2015  . GERD (gastroesophageal reflux disease) 06/19/2014  . History of continuous positive airway pressure (CPAP) therapy at home 06/19/2014  . Leg swelling 05/18/2012  . Sebaceous hyperplasia 10/24/2011  . BACK PAIN, THORACIC  REGION 07/25/2009  . 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/19/2006  . OBESITY, NOS 05/19/2006  . DEPRESSION, MAJOR, RECURRENT 05/19/2006  . 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    Telisa Ohlsen Nilda Simmer PT, MPH  12/28/2018, 12:09 PM  [facility] [place], [place], [address] Phone: [phone]   Fax:  [phone]  Name: Laura [name] MRN: [id number] Date of Birth: [birth year]

## 2018-12-30 ENCOUNTER — Encounter: Payer: Self-pay | Admitting: Physical Therapy

## 2018-12-30 ENCOUNTER — Ambulatory Visit (INDEPENDENT_AMBULATORY_CARE_PROVIDER_SITE_OTHER): Payer: Managed Care, Other (non HMO) | Admitting: Physical Therapy

## 2018-12-30 ENCOUNTER — Other Ambulatory Visit: Payer: Self-pay

## 2018-12-30 DIAGNOSIS — M25571 Pain in right ankle and joints of right foot: Secondary | ICD-10-CM

## 2018-12-30 DIAGNOSIS — M6281 Muscle weakness (generalized): Secondary | ICD-10-CM | POA: Diagnosis not present

## 2018-12-30 DIAGNOSIS — R29898 Other symptoms and signs involving the musculoskeletal system: Secondary | ICD-10-CM | POA: Diagnosis not present

## 2018-12-30 DIAGNOSIS — R2689 Other abnormalities of gait and mobility: Secondary | ICD-10-CM | POA: Diagnosis not present

## 2018-12-30 NOTE — Therapy (Signed)
[place], [place], [address] Phone: [phone]   Fax:  [phone]  Physical Therapy Treatment  Patient Details  Name: Laura [name] MRN: [id number] Date of Birth: 1970-08-27 Referring Provider (PT): Dr Dianah Field    Encounter Date: 12/30/2018  PT End of Session - 12/30/18 1404    Visit Number  2    Number of Visits  12    Date for PT Re-Evaluation  02/08/19    PT Start Time  4010    PT Stop Time  1455    PT Time Calculation (min)  51 min       Past Medical History:  Diagnosis Date  . Allergic rhinitis   . Dyslipidemia   . Dysmenorrhea   . Gall bladder disease   . Infertility, female   . Obesity   . Polycystic ovary disease   . 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 LAPAROSCOPIC CHOLECYSTECTOMY    . SLEEVE GASTROPLASTY    . SUPRAVENTRICULAR TACHYCARDIA ABLATION N/A 07/23/2011   Procedure: SUPRAVENTRICULAR TACHYCARDIA ABLATION;  Surgeon: Evans Lance, MD;  Location: [facility] CATH LAB;  Service: Cardiovascular;  Laterality: N/A;  . transthoracic echcardiogram  09/15/06    There were no vitals filed for this visit.  Subjective Assessment - 12/30/18 1413    Subjective  Pt reports she felt good the day of last visit, but pain increased the next day.  She believes the boot may be broken; she has placed a call to [facility] to address this.      Patient Stated Goals  get rid of the pain and walk normally     Currently in Pain?  Yes    Pain Score  5     Pain Location  Ankle    Pain Orientation  Right    Aggravating Factors   standing; walking    Pain Relieving Factors  ice, medicine          [facility] PT Assessment - 12/30/18 0001      Assessment   Medical Diagnosis  Rt Achille's tendinitis     Referring Provider (PT)  Dr Dianah Field     Onset Date/Surgical Date  12/12/18    Hand Dominance  Right    Next MD Visit   01/17/2019    Prior Therapy  none for ankle       Functional Tests   Functional tests  Single leg stance      Single Leg Stance   Comments  RLE: 5 sec          [facility] Adult PT Treatment/Exercise - 12/30/18 0001      Self-Care   Self-Care  Other Self-Care Comments    Other Self-Care Comments   pt educated on self massage to LE with roller stick to decrease fascial tightness; pt returned demo with cues. pt educated re: soft STM to Rt Achilles tendon; pt returned demo.       Vasopneumatic   Number Minutes Vasopneumatic   15 minutes    Vasopnuematic Location   Ankle   Rt   Vasopneumatic Pressure  Low    Vasopneumatic Temperature   34 deg       Ankle Exercises: Stretches   Soleus Stretch  4 reps;30 seconds   trial in long sitting; improved in seated position   Gastroc Stretch  3 reps;30 seconds   long sit with strap  Other Stretch  stretch into inversion and eversion 3 reps x 30 sec AAROM from pt.       Ankle Exercises: Aerobic   Nustep  L5: 5 min (range to tolerance      Ankle Exercises: Standing   Other Standing Ankle Exercises  standing weight shift from side to side, then SLS onto RLE x 5 sec x 5 reps       Ankle Exercises: Seated   ABC's  1 rep   10 letters only   Ankle Circles/Pumps  AROM;Right;10 reps    Heel Raises  Both;20 reps    Toe Raise  20 reps   both feet     Gait:  Pt instructed to ambulate with Rt toes pointed forward, foot in neutral position (observed with RLE ER); pt able to return demo with short gait of 25 ft. (gait with boot donned on RLE); no AD.     PT Long Term Goals - 12/28/18 1205      PT LONG TERM GOAL #1   Title  Increase AROM Rt anke to equal Lt 02/08/2019    Time  6    Period  Weeks    Status  New      PT LONG TERM GOAL #2   Title  Increase strength Rt ankle to 5-/5 to 5/5 in all planes 02/08/2019    Baseline  -    Time  6    Period  Weeks    Status  New      PT LONG TERM GOAL #3   Title  Progress to ambulation without boot  with normal gait pattern 02/08/2019    Time  6    Period  Weeks    Status  New      PT LONG TERM GOAL #4   Title  Independent in HEP 6/30/20202    Time  6    Period  Weeks    Status  New      PT LONG TERM GOAL #5   Title  improve FOTO =/< 39% limitation 02/08/2019    Time  6    Period  Weeks    Status  New            Plan - 12/30/18 1712    Clinical Impression Statement  Pt tolerated all exercises well, with minimal increase in discomfort.  Added seated soleus stretch without difficulty.  Pt encouraged to hold off on nitro patch prior to next visit so that we may do IASMT/ modalities to area.   Pt progressing towards goals.     Rehab Potential  Good    PT Frequency  2x / week    PT Duration  6 weeks    PT Treatment/Interventions  Patient/family education;ADLs/Self Care Home Management;Cryotherapy;Electrical Stimulation;Iontophoresis 4mg /ml Dexamethasone;Moist Heat;Ultrasound;Dry needling;Manual techniques;Therapeutic activities;Therapeutic exercise;Balance training;Gait training;Neuromuscular re-education    PT Next Visit Plan  progress with ROM and strength as tolerated; add Rt hip strengthening; manual work and modalities as indicated, US/ ionto?     PT Home Exercise Plan  Access Code: [email or web address]     Consulted and Agree with Plan of Care  Patient       Patient will benefit from skilled therapeutic intervention in order to improve the following deficits and impairments:  Postural dysfunction, Improper body mechanics, Pain, Increased muscle spasms, Decreased mobility, Decreased range of motion, Decreased strength, Decreased activity tolerance, Abnormal gait  Visit Diagnosis: Pain in right ankle and joints of right foot  Other symptoms  and signs involving the musculoskeletal system  Muscle weakness (generalized)  Other abnormalities of gait and mobility     Problem List Patient Active Problem List   Diagnosis Date Noted  . Pain and swelling of right knee 12/20/2018  .  Intractable right heel pain 12/20/2018  . Tendinitis of right hip flexor 12/14/2017  . Primary osteoarthritis of left hip 11/14/2016  . Trochanteric bursitis of both hips 10/17/2016  . Low back pain 09/05/2015  . Uterine fibroid 08/15/2015  . Menorrhagia with irregular cycle 08/14/2015  . Radiculitis of left cervical region 08/09/2015  . Post-traumatic osteoarthritis of left knee 02/15/2015  . History of bariatric surgery 02/06/2015  . GERD (gastroesophageal reflux disease) 06/19/2014  . History of continuous positive airway pressure (CPAP) therapy at home 06/19/2014  . Leg swelling 05/18/2012  . Sebaceous hyperplasia 10/24/2011  . BACK PAIN, THORACIC REGION 07/25/2009  . 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/19/2006  . OBESITY, NOS 05/19/2006  . DEPRESSION, MAJOR, RECURRENT 05/19/2006  . 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   Kerin Perna, PTA 12/30/18 5:15 PM  [place], [place], [address] Phone: [phone]   Fax:  [phone]  Name: Clodagh Odenthal MRN: [id number] Date of Birth: Oct 01, 1970

## 2019-01-05 ENCOUNTER — Other Ambulatory Visit: Payer: Self-pay

## 2019-01-05 ENCOUNTER — Ambulatory Visit (INDEPENDENT_AMBULATORY_CARE_PROVIDER_SITE_OTHER): Payer: Managed Care, Other (non HMO) | Admitting: Physical Therapy

## 2019-01-05 DIAGNOSIS — R29898 Other symptoms and signs involving the musculoskeletal system: Secondary | ICD-10-CM

## 2019-01-05 DIAGNOSIS — M25571 Pain in right ankle and joints of right foot: Secondary | ICD-10-CM

## 2019-01-05 DIAGNOSIS — M6281 Muscle weakness (generalized): Secondary | ICD-10-CM

## 2019-01-05 NOTE — Patient Instructions (Signed)
Access Code: [email or web address]  URL: [email or web address]  Date: 01/05/2019  Prepared by: Kerin Perna   Exercises  (Added) Supine ITB Stretch with Strap - 2-3 reps - 1 sets - 30 seconds hold - 1x daily - 7x weekly  Supine Piriformis Stretch Pulling Heel to Hip - 2-3 reps - 1 sets - 30 hold - 1x daily - 7x weekly  Ankle Inversion Eversion Towel Slide - 10 reps - 1 sets - 1x daily - 7x weekly  Seated Toe Towel Scrunches - 10 reps - 1 sets - 1x daily - 7x weekly

## 2019-01-05 NOTE — Therapy (Signed)
[place], [place], [address] Phone: [phone]   Fax:  [phone]  Physical Therapy Treatment  Patient Details  Name: Laura [name] MRN: [id number] Date of Birth: Dec 25, 1970 Referring Provider (PT): Dr Dianah Field    Encounter Date: 01/05/2019  PT End of Session - 01/05/19 1238    Visit Number  3    Number of Visits  12    Date for PT Re-Evaluation  02/08/19    PT Start Time  1230    PT Stop Time  1320    PT Time Calculation (min)  50 min    Activity Tolerance  Patient tolerated treatment well    Behavior During Therapy  [facility] for tasks assessed/performed       Past Medical History:  Diagnosis Date  . Allergic rhinitis   . Dyslipidemia   . Dysmenorrhea   . Gall bladder disease   . Infertility, female   . Obesity   . Polycystic ovary disease   . 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 LAPAROSCOPIC CHOLECYSTECTOMY    . SLEEVE GASTROPLASTY    . SUPRAVENTRICULAR TACHYCARDIA ABLATION N/A 07/23/2011   Procedure: SUPRAVENTRICULAR TACHYCARDIA ABLATION;  Surgeon: Evans Lance, MD;  Location: [facility] CATH LAB;  Service: Cardiovascular;  Laterality: N/A;  . transthoracic echcardiogram  09/15/06    There were no vitals filed for this visit.  Subjective Assessment - 01/05/19 1239    Subjective  Pt reports yesterday her Rt Achilles began to feel much better.  She worked 12 hrs but did AROM with foot most of day.  Today her Lt heel began to hurt.     Patient Stated Goals  get rid of the pain and walk normally     Currently in Pain?  Yes    Pain Score  5     Pain Location  Heel    Pain Orientation  Left    Pain Descriptors / Indicators  Sharp    Aggravating Factors   pressure, standing    Pain Relieving Factors  rest          [facility] PT Assessment - 01/05/19 0001      Assessment   Medical Diagnosis  Rt Achille's tendinitis     Referring Provider (PT)  Dr Dianah Field     Onset Date/Surgical Date  12/12/18    Hand Dominance  Right    Next MD Visit  01/17/2019    Prior Therapy  none for ankle       AROM   Right Ankle Dorsiflexion  10    Right Ankle Plantar Flexion  60       [facility] Adult PT Treatment/Exercise - 01/05/19 0001      Modalities   Modalities  Iontophoresis      Iontophoresis   Type of Iontophoresis  Dexamethasone    Location  Rt Achilles tendon    Dose  1.0 cc    Time  6 hr patch, 80 mA stat patch      Vasopneumatic   Number Minutes Vasopneumatic   --   declined; will ice at home.     Manual Therapy   Manual Therapy  Soft tissue mobilization    Manual therapy comments  Pt prone    Soft tissue mobilization  IASTM to Rt calf and prox Achilles tendon to decrease fascial restrictions and improve ROM.  Ankle Exercises: Stretches   Soleus Stretch  30 seconds;3 reps   1 rep standing, 2 reps seated    Gastroc Stretch  3 reps;30 seconds   standing; slant board    Other Stretch  Lt ITB stretch in supine x 3 reps; RLE x 2 reps; Rt/Lt piriformis stretchx 2 reps of 30 sec       Ankle Exercises: Aerobic   Nustep  L5: 5 min (range to tolerance      Ankle Exercises: Seated   Ankle Circles/Pumps  AROM;Right;10 reps;Left    Towel Crunch  2 reps   RLE   Towel Inversion/Eversion  2 reps   RLE   Other Seated Ankle Exercises  seated Rt / Lt ankle resisted DF, Inversion, eversion x 10 reps each with red band.              PT Education - 01/05/19 1327    Education Details  HEP - added     Person(s) Educated  Patient    Methods  Explanation;Handout;Verbal cues;Demonstration    Comprehension  Verbalized understanding;Returned demonstration          PT Long Term Goals - 12/28/18 1205      PT LONG TERM GOAL #1   Title  Increase AROM Rt anke to equal Lt 02/08/2019    Time  6    Period  Weeks    Status  New      PT LONG TERM GOAL #2   Title  Increase strength Rt ankle to 5-/5 to 5/5  in all planes 02/08/2019    Baseline  -    Time  6    Period  Weeks    Status  New      PT LONG TERM GOAL #3   Title  Progress to ambulation without boot with normal gait pattern 02/08/2019    Time  6    Period  Weeks    Status  New      PT LONG TERM GOAL #4   Title  Independent in HEP 6/30/20202    Time  6    Period  Weeks    Status  New      PT LONG TERM GOAL #5   Title  improve FOTO =/< 39% limitation 02/08/2019    Time  6    Period  Weeks    Status  New            Plan - 01/05/19 1710    Clinical Impression Statement  Pt demonstrated improved Rt ankle ROM. She tolerated standing gastroc stretch well, but was unable to tolerate standing soleus stretch.  She has a point tender spot in her Rt Achilles where the tissue feels thicker; applied trial of ionto patch to this area today.  Progressing well towards goals.     Rehab Potential  Good    PT Frequency  2x / week    PT Duration  6 weeks    PT Treatment/Interventions  Patient/family education;ADLs/Self Care Home Management;Cryotherapy;Electrical Stimulation;Iontophoresis 4mg /ml Dexamethasone;Moist Heat;Ultrasound;Dry needling;Manual techniques;Therapeutic activities;Therapeutic exercise;Balance training;Gait training;Neuromuscular re-education    PT Next Visit Plan  assess response to ionto.  add Rt hip strengthening; progress ROM/ strengthening exercises as tolerated.     PT Home Exercise Plan  Access Code: [email or web address]     Consulted and Agree with Plan of Care  Patient       Patient will benefit from skilled therapeutic intervention in order to improve the following deficits and impairments:  Postural dysfunction, Improper body mechanics, Pain, Increased muscle spasms, Decreased mobility, Decreased range of motion, Decreased strength, Decreased activity tolerance, Abnormal gait  Visit Diagnosis: Pain in right ankle and joints of right foot  Other symptoms and signs involving the musculoskeletal system  Muscle weakness  (generalized)     Problem List Patient Active Problem List   Diagnosis Date Noted  . Pain and swelling of right knee 12/20/2018  . Intractable right heel pain 12/20/2018  . Tendinitis of right hip flexor 12/14/2017  . Primary osteoarthritis of left hip 11/14/2016  . Trochanteric bursitis of both hips 10/17/2016  . Low back pain 09/05/2015  . Uterine fibroid 08/15/2015  . Menorrhagia with irregular cycle 08/14/2015  . Radiculitis of left cervical region 08/09/2015  . Post-traumatic osteoarthritis of left knee 02/15/2015  . History of bariatric surgery 02/06/2015  . GERD (gastroesophageal reflux disease) 06/19/2014  . History of continuous positive airway pressure (CPAP) therapy at home 06/19/2014  . Leg swelling 05/18/2012  . Sebaceous hyperplasia 10/24/2011  . BACK PAIN, THORACIC REGION 07/25/2009  . 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/19/2006  . OBESITY, NOS 05/19/2006  . DEPRESSION, MAJOR, RECURRENT 05/19/2006  . 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   Kerin Perna, PTA 01/05/19 5:20 PM  [facility] [place], [place], [address] Phone: [phone]   Fax:  [phone]  Name: Adianna Darwin MRN: [id number] Date of Birth: 1971-08-05

## 2019-01-07 ENCOUNTER — Encounter: Payer: Self-pay | Admitting: Rehabilitative and Restorative Service Providers"

## 2019-01-07 ENCOUNTER — Ambulatory Visit (INDEPENDENT_AMBULATORY_CARE_PROVIDER_SITE_OTHER): Payer: Managed Care, Other (non HMO) | Admitting: Rehabilitative and Restorative Service Providers"

## 2019-01-07 ENCOUNTER — Other Ambulatory Visit: Payer: Self-pay

## 2019-01-07 DIAGNOSIS — M25571 Pain in right ankle and joints of right foot: Secondary | ICD-10-CM | POA: Diagnosis not present

## 2019-01-07 DIAGNOSIS — M6281 Muscle weakness (generalized): Secondary | ICD-10-CM

## 2019-01-07 DIAGNOSIS — R29898 Other symptoms and signs involving the musculoskeletal system: Secondary | ICD-10-CM

## 2019-01-07 DIAGNOSIS — R2689 Other abnormalities of gait and mobility: Secondary | ICD-10-CM | POA: Diagnosis not present

## 2019-01-07 NOTE — Therapy (Signed)
[facility] [place], [place], [address] Phone: [phone]   Fax:  [phone]  Physical Therapy Treatment  Patient Details  Name: Laura [name] MRN: [id number] Date of Birth: [birth year] Referring Provider (PT): Dr Dianah Field    Encounter Date: 01/07/2019  PT End of Session - 01/07/19 0911    Visit Number  4    Number of Visits  12    Date for PT Re-Evaluation  02/08/19    PT Start Time  0905    PT Stop Time  0957    PT Time Calculation (min)  52 min    Activity Tolerance  Patient tolerated treatment well       Past Medical History:  Diagnosis Date  . Allergic rhinitis   . Dyslipidemia   . Dysmenorrhea   . Gall bladder disease   . Infertility, female   . Obesity   . Polycystic ovary disease   . 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 LAPAROSCOPIC CHOLECYSTECTOMY    . SLEEVE GASTROPLASTY    . SUPRAVENTRICULAR TACHYCARDIA ABLATION N/A 07/23/2011   Procedure: SUPRAVENTRICULAR TACHYCARDIA ABLATION;  Surgeon: Evans Lance, MD;  Location: [facility] CATH LAB;  Service: Cardiovascular;  Laterality: N/A;  . transthoracic echcardiogram  09/15/06    There were no vitals filed for this visit.  Subjective Assessment - 01/07/19 0918    Subjective  Rt foot is improving. Ionto worked well once the bee stinging stopped. Patient is now having more painin the Lt foot than the Rt     Currently in Pain?  Yes    Pain Score  2    Lt foot pain w/ wt bearing 4/10    Pain Location  Heel    Pain Orientation  Right    Pain Descriptors / Indicators  Aching    Pain Type  Acute pain                       [facility] Adult PT Treatment/Exercise - 01/07/19 0001      Knee/Hip Exercises: Supine   Other Supine Knee/Hip Exercises  clam w/ green TB holding one LE still and moving opposite x 10 each LE       Knee/Hip Exercises: Sidelying   Hip ABduction   AROM;Strengthening;Right;Left;2 sets;10 reps   hip into extension; leading up with heel      Iontophoresis   Type of Iontophoresis  Dexamethasone    Location  Rt Achilles tendon    Dose  1.0 cc    Time  6 hr patch, 80 mA stat patch      Manual Therapy   Manual Therapy  Soft tissue mobilization    Manual therapy comments  Pt prone    Soft tissue mobilization  IASTM to Rt calf and prox Achilles tendon to decrease fascial restrictions and improve ROM.       Ankle Exercises: Stretches   Soleus Stretch  30 seconds;3 reps   standing to tolerance modifying position as needed    Gastroc Stretch  3 reps;30 seconds   standing; repeated on slant board x 3 reps    Other Stretch  Lt ITB stretch in supine x 3 reps; RLE x 2 reps; Rt/Lt piriformis stretchx 2 reps of 30 sec       Ankle Exercises: Aerobic   Nustep  L5: 6 min (range to tolerance  PT Education - 01/07/19 0959    Education Details  HEP     Person(s) Educated  Patient    Methods  Explanation;Demonstration;Tactile cues;Verbal cues;Handout    Comprehension  Verbalized understanding;Returned demonstration;Verbal cues required;Tactile cues required          PT Long Term Goals - 12/28/18 1205      PT LONG TERM GOAL #1   Title  Increase AROM Rt anke to equal Lt 02/08/2019    Time  6    Period  Weeks    Status  New      PT LONG TERM GOAL #2   Title  Increase strength Rt ankle to 5-/5 to 5/5 in all planes 02/08/2019    Baseline  -    Time  6    Period  Weeks    Status  New      PT LONG TERM GOAL #3   Title  Progress to ambulation without boot with normal gait pattern 02/08/2019    Time  6    Period  Weeks    Status  New      PT LONG TERM GOAL #4   Title  Independent in HEP 6/30/20202    Time  6    Period  Weeks    Status  New      PT LONG TERM GOAL #5   Title  improve FOTO =/< 39% limitation 02/08/2019    Time  6    Period  Weeks    Status  New            Plan - 01/07/19 9449    Clinical  Impression Statement  Rt foot is improving but the Lt foot hurts on the bottom of the heel to posterior calcaneous. Patient will add standing stretch for home. Continue to work on myofacial release. Good response to manual work. Progressing gradually toward stated goals of therapy.     Rehab Potential  Good    PT Frequency  2x / week    PT Duration  6 weeks    PT Treatment/Interventions  Patient/family education;ADLs/Self Care Home Management;Cryotherapy;Electrical Stimulation;Iontophoresis 4mg /ml Dexamethasone;Moist Heat;Ultrasound;Dry needling;Manual techniques;Therapeutic activities;Therapeutic exercise;Balance training;Gait training;Neuromuscular re-education    PT Next Visit Plan  continue ionto; assess response to Rt hip strengthening; progress ROM/ strengthening exercises as tolerated.     PT Home Exercise Plan  Access Code: [email or web address]     Consulted and Agree with Plan of Care  Patient       Patient will benefit from skilled therapeutic intervention in order to improve the following deficits and impairments:  Postural dysfunction, Improper body mechanics, Pain, Increased muscle spasms, Decreased mobility, Decreased range of motion, Decreased strength, Decreased activity tolerance, Abnormal gait  Visit Diagnosis: Pain in right ankle and joints of right foot  Other symptoms and signs involving the musculoskeletal system  Muscle weakness (generalized)  Other abnormalities of gait and mobility     Problem List Patient Active Problem List   Diagnosis Date Noted  . Pain and swelling of right knee 12/20/2018  . Intractable right heel pain 12/20/2018  . Tendinitis of right hip flexor 12/14/2017  . Primary osteoarthritis of left hip 11/14/2016  . Trochanteric bursitis of both hips 10/17/2016  . Low back pain 09/05/2015  . Uterine fibroid 08/15/2015  . Menorrhagia with irregular cycle 08/14/2015  . Radiculitis of left cervical region 08/09/2015  . Post-traumatic osteoarthritis of left  knee 02/15/2015  . History of bariatric surgery 02/06/2015  . GERD (gastroesophageal  reflux disease) 06/19/2014  . History of continuous positive airway pressure (CPAP) therapy at home 06/19/2014  . Leg swelling 05/18/2012  . Sebaceous hyperplasia 10/24/2011  . BACK PAIN, THORACIC REGION 07/25/2009  . 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/19/2006  . OBESITY, NOS 05/19/2006  . DEPRESSION, MAJOR, RECURRENT 05/19/2006  . 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    Avice Funchess Nilda Simmer PT, MPH  01/07/2019, 10:03 AM  [facility] [place], [place], [address] Phone: [phone]   Fax:  [phone]  Name: Laura [name] MRN: [id number] Date of Birth: [birth year]

## 2019-01-07 NOTE — Patient Instructions (Signed)
Access Code: [email or web address]  URL: [email or web address]  Date: 01/07/2019  Prepared by: Gillermo Murdoch   Exercises  Seated Calf Stretch with Strap - 3 reps - 1 sets - 30 sec hold - 2x daily - 7x weekly  Seated Toe Raise - 3 reps - 1 sets - 30 sec hold - 2x daily - 7x weekly  Seated Ankle Inversion Eversion PROM - 3 reps - 1 sets - 30 sec hold - 2x daily - 7x weekly  Seated Figure 4 Ankle Inversion with Resistance - 3 reps - 1 sets - 30 sec hold - 2x daily - 7x weekly  Supine Ankle Circles - 3 reps - 1 sets - 30 sec hold - 2x daily - 7x weekly  Ankle Alphabet in Elevation - 3 reps - 1 sets - 30 sec hold - 2x daily - 7x weekly  Correct Standing Posture - 2x daily - 7x weekly  Seated Dorsiflexion Stretch - 3 reps - 1 sets - 30 seconds hold - 2x daily - 7x weekly  Supine ITB Stretch with Strap - 2-3 reps - 1 sets - 30 seconds hold - 1x daily - 7x weekly  Supine Piriformis Stretch Pulling Heel to Hip - 2-3 reps - 1 sets - 30 hold - 1x daily - 7x weekly  Ankle Inversion Eversion Towel Slide - 10 reps - 1 sets - 1x daily - 7x weekly  Seated Toe Towel Scrunches - 10 reps - 1 sets - 1x daily - 7x weekly  Sidelying Hip Abduction - 10 reps - 1 sets - 2-3 sec hold - 2x daily - 7x weekly  Hooklying Clamshell with Resistance - 10 reps - 1 sets - 2-3 sec hold - 2x daily - 7x weekly

## 2019-01-10 ENCOUNTER — Ambulatory Visit (INDEPENDENT_AMBULATORY_CARE_PROVIDER_SITE_OTHER): Payer: Managed Care, Other (non HMO) | Admitting: Physical Therapy

## 2019-01-10 ENCOUNTER — Other Ambulatory Visit: Payer: Self-pay

## 2019-01-10 DIAGNOSIS — R29898 Other symptoms and signs involving the musculoskeletal system: Secondary | ICD-10-CM | POA: Diagnosis not present

## 2019-01-10 DIAGNOSIS — M25571 Pain in right ankle and joints of right foot: Secondary | ICD-10-CM | POA: Diagnosis not present

## 2019-01-10 DIAGNOSIS — M6281 Muscle weakness (generalized): Secondary | ICD-10-CM

## 2019-01-10 NOTE — Therapy (Signed)
[place], [place], [address] Phone: [phone]   Fax:  [phone]  Physical Therapy Treatment  Patient Details  Name: Laura [name] MRN: [id number] Date of Birth: 07/16/1971 Referring Provider (PT): Dr Dianah Field    Encounter Date: 01/10/2019  PT End of Session - 01/10/19 1605    Visit Number  5    Number of Visits  12    Date for PT Re-Evaluation  02/08/19    PT Start Time  1600    PT Stop Time  1641    PT Time Calculation (min)  41 min    Behavior During Therapy  [facility] for tasks assessed/performed       Past Medical History:  Diagnosis Date  . Allergic rhinitis   . Dyslipidemia   . Dysmenorrhea   . Gall bladder disease   . Infertility, female   . Obesity   . Polycystic ovary disease   . 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 LAPAROSCOPIC CHOLECYSTECTOMY    . SLEEVE GASTROPLASTY    . SUPRAVENTRICULAR TACHYCARDIA ABLATION N/A 07/23/2011   Procedure: SUPRAVENTRICULAR TACHYCARDIA ABLATION;  Surgeon: Evans Lance, MD;  Location: [facility] CATH LAB;  Service: Cardiovascular;  Laterality: N/A;  . transthoracic echcardiogram  09/15/06    There were no vitals filed for this visit.  Subjective Assessment - 01/10/19 1605    Subjective  Pt reports she was up on her feet a little more over weekend and has notices her Rt heel is more sore.      Currently in Pain?  Yes    Pain Score  4     Pain Location  --   Achilles and heel    Pain Orientation  Right    Pain Descriptors / Indicators  Aching    Aggravating Factors   pressure, standing    Pain Relieving Factors  rest         [facility] PT Assessment - 01/10/19 0001      Assessment   Medical Diagnosis  Rt Achille's tendinitis     Referring Provider (PT)  Dr Dianah Field     Onset Date/Surgical Date  12/12/18    Hand Dominance  Right    Next MD Visit  01/17/2019    Prior Therapy  none  for ankle        [facility] Adult PT Treatment/Exercise - 01/10/19 0001      Knee/Hip Exercises: Supine   Other Supine Knee/Hip Exercises  clam w/ green TB holding one LE still and moving opposite x 10 each LE     Other Supine Knee/Hip Exercises  piriformis stretch in supine x 30 sec x 1      Knee/Hip Exercises: Sidelying   Hip ABduction  Strengthening;Left;Right;1 set;10 reps   circles CW/CCW     Modalities   Modalities  Cryotherapy;Iontophoresis;Ultrasound      Cryotherapy   Number Minutes Cryotherapy  3 Minutes    Cryotherapy Location  --   Rt heel    Type of Cryotherapy  Ice massage      Ultrasound   Ultrasound Location  Rt Achilles    Ultrasound Parameters  50%, 0.8 w/cm2, 8 min     Ultrasound Goals  Pain;Edema      Iontophoresis   Type of Iontophoresis  Dexamethasone    Location  Rt Achilles tendon    Dose  1.0    Time  12 hr patch; 120 mA      Ankle Exercises: Aerobic   Nustep  L5: 5 min (range to tolerance)      Ankle Exercises: Stretches   Soleus Stretch  2 reps;20 seconds   seated   Gastroc Stretch  3 reps;30 seconds   standing   Other Stretch  Lt/Rt hamstring stretch x 30 sec x 2 reps in supine with strap        PT Long Term Goals - 12/28/18 1205      PT LONG TERM GOAL #1   Title  Increase AROM Rt anke to equal Lt 02/08/2019    Time  6    Period  Weeks    Status  New      PT LONG TERM GOAL #2   Title  Increase strength Rt ankle to 5-/5 to 5/5 in all planes 02/08/2019    Baseline  -    Time  6    Period  Weeks    Status  New      PT LONG TERM GOAL #3   Title  Progress to ambulation without boot with normal gait pattern 02/08/2019    Time  6    Period  Weeks    Status  New      PT LONG TERM GOAL #4   Title  Independent in HEP 6/30/20202    Time  6    Period  Weeks    Status  New      PT LONG TERM GOAL #5   Title  improve FOTO =/< 39% limitation 02/08/2019    Time  6    Period  Weeks    Status  New            Plan - 01/10/19 1657     Clinical Impression Statement  Pt reporting increased pain in Rt heel with weight bearing through heel (with/without boot).  Trial of pulsed [place] and ice massage added to Rt heel. Pt reported less pain after manual therapy and ice; but pain returned once she stood and was in boot.  Progressing gradually towards therapy goals.     Rehab Potential  Good    PT Frequency  2x / week    PT Duration  6 weeks    PT Treatment/Interventions  Patient/family education;ADLs/Self Care Home Management;Cryotherapy;Electrical Stimulation;Iontophoresis 4mg /ml Dexamethasone;Moist Heat;Ultrasound;Dry needling;Manual techniques;Therapeutic activities;Therapeutic exercise;Balance training;Gait training;Neuromuscular re-education    PT Next Visit Plan  continue ionto; assess response to Rt hip strengthening; progress ROM/ strengthening exercises as tolerated.     PT Home Exercise Plan  Access Code: [email or web address]     Consulted and Agree with Plan of Care  Patient       Patient will benefit from skilled therapeutic intervention in order to improve the following deficits and impairments:  Postural dysfunction, Improper body mechanics, Pain, Increased muscle spasms, Decreased mobility, Decreased range of motion, Decreased strength, Decreased activity tolerance, Abnormal gait  Visit Diagnosis: Pain in right ankle and joints of right foot  Other symptoms and signs involving the musculoskeletal system  Muscle weakness (generalized)     Problem List Patient Active Problem List   Diagnosis Date Noted  . Pain and swelling of right knee 12/20/2018  . Intractable right heel pain 12/20/2018  . Tendinitis of right hip flexor 12/14/2017  . Primary osteoarthritis of left hip 11/14/2016  . Trochanteric bursitis of both hips 10/17/2016  . Low back pain 09/05/2015  . Uterine fibroid 08/15/2015  . Menorrhagia with irregular  cycle 08/14/2015  . Radiculitis of left cervical region 08/09/2015  . Post-traumatic osteoarthritis of  left knee 02/15/2015  . History of bariatric surgery 02/06/2015  . GERD (gastroesophageal reflux disease) 06/19/2014  . History of continuous positive airway pressure (CPAP) therapy at home 06/19/2014  . Leg swelling 05/18/2012  . Sebaceous hyperplasia 10/24/2011  . BACK PAIN, THORACIC REGION 07/25/2009  . 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/19/2006  . OBESITY, NOS 05/19/2006  . DEPRESSION, MAJOR, RECURRENT 05/19/2006  . 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   Kerin Perna, PTA 01/10/19 5:04 PM  [place], [place], [address] Phone: [phone]   Fax:  [phone]  Name: Avea Mcgowen MRN: [id number] Date of Birth: 09-16-1970

## 2019-01-13 ENCOUNTER — Encounter: Payer: Self-pay | Admitting: Rehabilitative and Restorative Service Providers"

## 2019-01-13 ENCOUNTER — Other Ambulatory Visit: Payer: Self-pay

## 2019-01-13 ENCOUNTER — Ambulatory Visit (INDEPENDENT_AMBULATORY_CARE_PROVIDER_SITE_OTHER): Payer: Managed Care, Other (non HMO) | Admitting: Rehabilitative and Restorative Service Providers"

## 2019-01-13 DIAGNOSIS — M6281 Muscle weakness (generalized): Secondary | ICD-10-CM | POA: Diagnosis not present

## 2019-01-13 DIAGNOSIS — M25571 Pain in right ankle and joints of right foot: Secondary | ICD-10-CM

## 2019-01-13 DIAGNOSIS — R29898 Other symptoms and signs involving the musculoskeletal system: Secondary | ICD-10-CM

## 2019-01-13 DIAGNOSIS — R2689 Other abnormalities of gait and mobility: Secondary | ICD-10-CM | POA: Diagnosis not present

## 2019-01-13 NOTE — Patient Instructions (Addendum)
Strengthening: Hip Abduction - Resisted no band lead with heel     With tubing around right leg, other side toward anchor, extend leg out from side. Repeat ____ times per set. Do ____ sets per session. Do ____ sessions per day.  http://orth.exer.us/634   Copyright  VHI. All rights reserved.  Strengthening: Hip Extension - Resisted    With tubing around right ankle, face anchor and pull leg straight back. Repeat ____ times per set. Do ____ sets per session. Do ____ sessions per day.  http://orth.exer.us/636   Copyright  VHI. All rights reserved.  Balance: Unilateral    Attempt to balance on left leg, eyes open. Hold __20-30 __ seconds. Repeat ___3-5_ times per set. Do ____ sets per session. Do ____ sessions per day. Perform exercise with eyes closed.  http://orth.exer.us/28   Copyright  VHI. All rights reserved.  Heel Raise: Bilateral (Standing) be sure to stretch on incline after     Rise on balls of feet. Repeat __10__ times per set. Do _1___ sets per session. Do ____ sessions per day.   SIT TO STAND: No Device  Very slow return to sit from stand     Sit with feet shoulder-width apart, on floor. Lean chest forward, raise hips up from surface. Straighten hips and knees. Weight bear equally on left and right sides. _5-10__ reps per set, ___ sets per day, ___ days per week Place left leg closer to sitting surface.  Copyright  VHI. All rights reserved.  Bridge core tight     Lie back, legs bent. Inhale, pressing hips up. Keeping ribs in, lengthen lower back. Exhale, rolling down along spine from top. Repeat __10-20__ times. Do ____ sessions per day.  http://pm.exer.us/55   Copyright  VHI. All rights reserved.  Strengthening: Hip Abduction (Side-Lying) tap fwd/back 3 times each =  5-10 reps     Tighten muscles on front of left thigh, then lift leg ____ inches from surface, keeping knee locked.  Repeat ____ times per set. Do ____ sets per session. Do ____  sessions per day.   Balance: Unilateral - Forward Lean to touch table or chair     Stand on left foot, hands on hips. Keeping hips level, bend forward as if to touch forehead to wall. Hold ____ seconds. Relax. Repeat ____ times per set. Do ____ sets per session. Do ____ sessions per day.  http://orth.exer.us/88   Copyright  VHI. All rights reserved.

## 2019-01-13 NOTE — Therapy (Addendum)
[place], [place], [address] Phone: [phone]   Fax:  [phone]  Physical Therapy Treatment  Patient Details  Name: Laura [name] MRN: [id number] Date of Birth: Jun 12, 1971 Referring Provider (PT): Dr Dianah Field    Encounter Date: 01/13/2019  PT End of Session - 01/13/19 1611    Visit Number  6    Number of Visits  12    Date for PT Re-Evaluation  02/08/19    PT Start Time  3491    PT Stop Time  7915    PT Time Calculation (min)  54 min    Activity Tolerance  Patient tolerated treatment well       Past Medical History:  Diagnosis Date  . Allergic rhinitis   . Dyslipidemia   . Dysmenorrhea   . Gall bladder disease   . Infertility, female   . Obesity   . Polycystic ovary disease   . 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 LAPAROSCOPIC CHOLECYSTECTOMY    . SLEEVE GASTROPLASTY    . SUPRAVENTRICULAR TACHYCARDIA ABLATION N/A 07/23/2011   Procedure: SUPRAVENTRICULAR TACHYCARDIA ABLATION;  Surgeon: Evans Lance, MD;  Location: [facility] CATH LAB;  Service: Cardiovascular;  Laterality: N/A;  . transthoracic echcardiogram  09/15/06    There were no vitals filed for this visit.  Subjective Assessment - 01/13/19 1611    Subjective  Foot and ankle are feeling better. Rt foot is irritated when in the boot. She sees Dr. Darene Lamer Monday. Hoping to remove the boot on Monday.     Currently in Pain?  No/denies         [facility] PT Assessment - 01/13/19 0001      Assessment   Medical Diagnosis  Rt Achille's tendinitis     Referring Provider (PT)  Dr Dianah Field     Onset Date/Surgical Date  12/12/18    Hand Dominance  Right    Next MD Visit  01/17/2019    Prior Therapy  none for ankle       Observation/Other Assessments   Focus on Therapeutic Outcomes (FOTO)   36% limitation       Functional Tests   Functional tests  Single leg stance      Single Leg Stance   Comments  6-8 sec       AROM   Right Ankle Dorsiflexion  10    Right Ankle Plantar Flexion  60    Left Ankle Dorsiflexion  10    Left Ankle Plantar Flexion  65      Strength   Right Ankle Dorsiflexion  5/5    Right Ankle Plantar Flexion  4+/5   discomfort in calf    Right Ankle Inversion  5/5    Right Ankle Eversion  5/5    Left Ankle Dorsiflexion  5/5    Left Ankle Plantar Flexion  5/5    Left Ankle Inversion  5/5    Left Ankle Eversion  5/5      Palpation   Palpation comment  tenderness to palpation through lateral calcaneous                   [facility] Adult PT Treatment/Exercise - 01/13/19 0001      Knee/Hip Exercises: Supine   Other Supine Knee/Hip Exercises  piriformis stretch in supine x 30 sec x 1      Ankle Exercises: Aerobic  Nustep  L5: 5 min (range to tolerance)      Ankle Exercises: Stretches   Soleus Stretch  2 reps;20 seconds   seated   Gastroc Stretch  3 reps;30 seconds   standing     Ankle Exercises: Standing   SLS  10 sec hold x 3 each LE - UE support as needed for balance - reacihing forward to touck surface when in SLS Rt - 8 reps unsteady    Other Standing Ankle Exercises  hp abduction; hip extension 10 reps each LE       Ankle Exercises: Sidelying   Other Sidelying Ankle Exercises  hip abduction hips forward; leading up with heel x 10; tap forward x3, then back x3 times for 6 reps              PT Education - 01/13/19 1707    Education Details  HEP     Person(s) Educated  Patient    Methods  Explanation;Demonstration;Tactile cues;Verbal cues;Handout    Comprehension  Verbalized understanding;Returned demonstration;Verbal cues required;Tactile cues required          PT Long Term Goals - 01/13/19 1710      PT LONG TERM GOAL #1   Title  Increase AROM Rt anke to equal Lt 02/08/2019    Time  6    Period  Weeks    Status  Achieved      PT LONG TERM GOAL #2   Title  Increase strength Rt ankle to 5-/5 to  5/5 in all planes 02/08/2019    Time  6    Period  Weeks    Status  Achieved      PT LONG TERM GOAL #3   Title  Progress to ambulation without boot with normal gait pattern 02/08/2019    Time  6    Period  Weeks    Status  Achieved      PT LONG TERM GOAL #4   Title  Independent in HEP 6/30/20202    Time  6    Period  Weeks    Status  Achieved      PT LONG TERM GOAL #5   Title  improve FOTO =/< 39% limitation 02/08/2019    Time  6    Period  Weeks    Status  Achieved              Patient will benefit from skilled therapeutic intervention in order to improve the following deficits and impairments:     Visit Diagnosis: Pain in right ankle and joints of right foot  Other symptoms and signs involving the musculoskeletal system  Muscle weakness (generalized)  Other abnormalities of gait and mobility     Problem List Patient Active Problem List   Diagnosis Date Noted  . Pain and swelling of right knee 12/20/2018  . Intractable right heel pain 12/20/2018  . Tendinitis of right hip flexor 12/14/2017  . Primary osteoarthritis of left hip 11/14/2016  . Trochanteric bursitis of both hips 10/17/2016  . Low back pain 09/05/2015  . Uterine fibroid 08/15/2015  . Menorrhagia with irregular cycle 08/14/2015  . Radiculitis of left cervical region 08/09/2015  . Post-traumatic osteoarthritis of left knee 02/15/2015  . History of bariatric surgery 02/06/2015  . GERD (gastroesophageal reflux disease) 06/19/2014  . History of continuous positive airway pressure (CPAP) therapy at home 06/19/2014  . Leg swelling 05/18/2012  . Sebaceous hyperplasia 10/24/2011  . BACK PAIN, THORACIC REGION 07/25/2009  . POLYCYSTIC OVARY  05/19/2006  . OBESITY, NOS 05/19/2006  . DEPRESSION, MAJOR, RECURRENT 05/19/2006  . 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    Celyn Nilda Simmer PT, MPH  01/13/2019, 5:13 PM  [facility] [place], [place], [address] Phone: [phone]   Fax:  [phone]  Name: Laura [name] MRN: [id number] Date of Birth: Jul 27, 1971  PHYSICAL THERAPY DISCHARGE SUMMARY  Visits from Start of Care: 6  Current functional level related to goals / functional outcomes: See last progress note for discharge status    Remaining deficits: Continued intermittent pain - pt wanted to hold PT and continue with independent HEP    Education / Equipment: HEP  Plan: Patient agrees to discharge.  Patient goals were met. Patient is being discharged due to meeting the stated rehab goals.  ?????    Celyn P. Helene Kelp PT, MPH 01/26/19 2:58 PM

## 2019-01-17 ENCOUNTER — Ambulatory Visit (INDEPENDENT_AMBULATORY_CARE_PROVIDER_SITE_OTHER): Payer: Managed Care, Other (non HMO) | Admitting: Sports Medicine

## 2019-01-17 ENCOUNTER — Encounter: Payer: Self-pay | Admitting: Sports Medicine

## 2019-01-17 DIAGNOSIS — M79671 Pain in right foot: Secondary | ICD-10-CM

## 2019-01-17 NOTE — Progress Notes (Signed)
Subjective:    CC: Follow-up  HPI: Right heel insertional Achilles tendinopathy: 80% better.  I reviewed the past medical history, family history, social history, surgical history, and allergies today and no changes were needed.  Please see the problem list section below in epic for further details.  Past Medical History: Past Medical History:  Diagnosis Date  . Allergic rhinitis   . Dyslipidemia   . Dysmenorrhea   . Gall bladder disease   . Infertility, female   . Obesity   . Polycystic ovary disease   . 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 LAPAROSCOPIC CHOLECYSTECTOMY    . SLEEVE GASTROPLASTY    . SUPRAVENTRICULAR TACHYCARDIA ABLATION N/A 07/23/2011   Procedure: SUPRAVENTRICULAR TACHYCARDIA ABLATION;  Surgeon: Evans Lance, MD;  Location: [facility] CATH LAB;  Service: Cardiovascular;  Laterality: N/A;  . transthoracic echcardiogram  09/15/06   Social History: Social History   Socioeconomic History  . Marital status: Married    Spouse name: matthew  . Number of children: 5  . Years of education: Not on file  . Highest education level: Not on file  Occupational History  . Occupation: Surveyor, quantity: [employer]  . Financial resource strain: Not on file  . Food insecurity:    Worry: Not on file    Inability: Not on file  . Transportation needs:    Medical: Not on file    Non-medical: Not on file  Tobacco Use  . Smoking status: Former Smoker    Last attempt to quit: 07/15/2002    Years since quitting: 16.5  . Smokeless tobacco: Never Used  Substance and Sexual Activity  . Alcohol use: No    Alcohol/week: 0.0 standard drinks  . Drug use: No  . Sexual activity: Yes    Partners: Male  Lifestyle  . Physical activity:    Days per week: Not on file    Minutes per session: Not on file  . Stress: Not on file  Relationships  . Social connections:   Talks on phone: Not on file    Gets together: Not on file    Attends religious service: Not on file    Active member of club or organization: Not on file    Attends meetings of clubs or organizations: Not on file    Relationship status: Not on file  Other Topics Concern  . Not on file  Social History Narrative   Some exercise.  In bariatric program at [facility].    Family History: Family History  Problem Relation Age of Onset  . Colon cancer Mother 65  . Breast cancer Mother   . Diabetes Mother   . Heart attack Mother        AMI, in late 64's  . Depression Mother   . Heart disease Mother   . Hypertension Father   . Thyroid disease Brother   . Thyroid disease Sister    Allergies: Allergies  Allergen Reactions  . Morphine Itching  . Neomycin   . Penicillins Hives   Medications: See med rec.  Review of Systems: No fevers, chills, night sweats, weight loss, chest pain, or shortness of breath.   Objective:    General: Well Developed, well nourished, and in no acute distress.  Neuro: Alert and oriented x3, extra-ocular muscles intact, sensation grossly intact.  HEENT: Normocephalic, atraumatic, pupils equal round reactive to light,  neck supple, no masses, no lymphadenopathy, thyroid nonpalpable.  Skin: Warm and dry, no rashes. Cardiac: Regular rate and rhythm, no murmurs rubs or gallops, no lower extremity edema.  Respiratory: Clear to auscultation bilaterally. Not using accessory muscles, speaking in full sentences.  Impression and Recommendations:    Intractable right heel pain Insertional Achilles tendinosis, 80% better with topical nitroglycerin, cam boot. Continue home rehab exercises, I would like her in another cam boot, she needs another one, the first was defective with a torn air bladder. After 2 weeks she can just use heel lifts with a regular shoe. She has developed some left-sided plantar fasciitis, adding some specific rehab exercises for this, return in 1  month.   ___________________________________________ Gwen Her. Dianah Field, M.D., ABFM., CAQSM. Primary Care and Sports Medicine [facility]  Adjunct Professor of [employer] [facility] of Medicine

## 2019-01-17 NOTE — Assessment & Plan Note (Signed)
Insertional Achilles tendinosis, 80% better with topical nitroglycerin, cam boot. Continue home rehab exercises, I would like her in another cam boot, she needs another one, the first was defective with a torn air bladder. After 2 weeks she can just use heel lifts with a regular shoe. She has developed some left-sided plantar fasciitis, adding some specific rehab exercises for this, return in 1 month.

## 2019-01-19 DIAGNOSIS — Z86010 Personal history of colonic polyps: Secondary | ICD-10-CM | POA: Insufficient documentation

## 2019-01-19 DIAGNOSIS — Z800 Family history of malignant neoplasm of digestive organs: Secondary | ICD-10-CM | POA: Insufficient documentation

## 2019-02-14 ENCOUNTER — Ambulatory Visit (INDEPENDENT_AMBULATORY_CARE_PROVIDER_SITE_OTHER): Payer: Managed Care, Other (non HMO)

## 2019-02-14 ENCOUNTER — Other Ambulatory Visit: Payer: Self-pay

## 2019-02-14 ENCOUNTER — Ambulatory Visit (INDEPENDENT_AMBULATORY_CARE_PROVIDER_SITE_OTHER): Payer: Managed Care, Other (non HMO) | Admitting: Sports Medicine

## 2019-02-14 ENCOUNTER — Encounter: Payer: Self-pay | Admitting: Sports Medicine

## 2019-02-14 DIAGNOSIS — M79671 Pain in right foot: Secondary | ICD-10-CM

## 2019-02-14 NOTE — Assessment & Plan Note (Addendum)
Persistent pain, partially better with topical nitroglycerin, heel lift, boot, rehab. At this point we are going to consider an injection, but I would like an MRI first to ensure this is insertional Achilles tendinitis, versus retrocalcaneal bursitis, or flexor pollicis longus tendinitis. X-rays today, hopefully MRI this afternoon.  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 We may consider an ultrasound-guided flexor pollicis longus tendon sheath injection versus an ultrasound-guided retrocalcaneal bursa injection followed by 1 week of boot immobilization at the follow-up.

## 2019-02-14 NOTE — Progress Notes (Addendum)
Subjective:    CC: Right heel pain  HPI: Laura [name] is a pleasant 48 year old female, for 2 months now we have been treating her right heel pain, clinically diagnosed as an insertional Achilles tendinosis, improved to some degree with topical nitroglycerin, occasional wearing of the cam boot, heel lifts, and rehabilitation.  She has finished greater than 6 weeks of physician directed conservative measures, and still has significant pain.  Pain is mild, persistent, localized without radiation.  I reviewed the past medical history, family history, social history, surgical history, and allergies today and no changes were needed.  Please see the problem list section below in epic for further details.  Past Medical History: Past Medical History:  Diagnosis Date  . Allergic rhinitis   . Dyslipidemia   . Dysmenorrhea   . Gall bladder disease   . Infertility, female   . Obesity   . Polycystic ovary disease   . 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 LAPAROSCOPIC CHOLECYSTECTOMY    . SLEEVE GASTROPLASTY    . SUPRAVENTRICULAR TACHYCARDIA ABLATION N/A 07/23/2011   Procedure: SUPRAVENTRICULAR TACHYCARDIA ABLATION;  Surgeon: Evans Lance, MD;  Location: [facility] CATH LAB;  Service: Cardiovascular;  Laterality: N/A;  . transthoracic echcardiogram  09/15/06   Social History: Social History   Socioeconomic History  . Marital status: Married    Spouse name: Laura [name]  . Number of children: 5  . Years of education: Not on file  . Highest education level: Not on file  Occupational History  . Occupation: Surveyor, quantity: Laura [name]  . Financial resource strain: Not on file  . Food insecurity    Worry: Not on file    Inability: Not on file  . Transportation needs    Medical: Not on file    Non-medical: Not on file  Tobacco Use  . Smoking status: Former Smoker    Quit date: 07/15/2002     Years since quitting: 16.5  . Smokeless tobacco: Never Used  Substance and Sexual Activity  . Alcohol use: No    Alcohol/week: 0.0 standard drinks  . Drug use: No  . Sexual activity: Yes    Partners: Male  Lifestyle  . Physical activity    Days per week: Not on file    Minutes per session: Not on file  . Stress: Not on file  Relationships  . Social Herbalist on phone: Not on file    Gets together: Not on file    Attends religious service: Not on file    Active member of club or organization: Not on file    Attends meetings of clubs or organizations: Not on file    Relationship status: Not on file  Other Topics Concern  . Not on file  Social History Narrative   Some exercise.  In bariatric program at [facility].    Family History: Family History  Problem Relation Age of Onset  . Colon cancer Mother 37  . Breast cancer Mother   . Diabetes Mother   . Heart attack Mother        AMI, in late 72's  . Depression Mother   . Heart disease Mother   . Hypertension Father   . Thyroid disease Brother   . Thyroid disease Sister    Allergies: Allergies  Allergen Reactions  . Morphine Itching  . Neomycin   .  Penicillins Hives   Medications: See med rec.  Review of Systems: No fevers, chills, night sweats, weight loss, chest pain, or shortness of breath.   Objective:    General: Well Developed, well nourished, and in no acute distress.  Neuro: Alert and oriented x3, extra-ocular muscles intact, sensation grossly intact.  HEENT: Normocephalic, atraumatic, pupils equal round reactive to light, neck supple, no masses, no lymphadenopathy, thyroid nonpalpable.  Skin: Warm and dry, no rashes. Cardiac: Regular rate and rhythm, no murmurs rubs or gallops, no lower extremity edema.  Respiratory: Clear to auscultation bilaterally. Not using accessory muscles, speaking in full sentences. Right foot: No visible erythema or swelling. Range of motion is full in all directions.  Strength is 5/5 in all directions. No hallux valgus. No pes cavus or pes planus. No abnormal callus noted. No pain over the navicular prominence, or base of fifth metatarsal. No tenderness to palpation of the calcaneal insertion of plantar fascia. Moderate pain at the Achilles insertion. No pain over the calcaneal bursa. No pain of the retrocalcaneal bursa. No tenderness to palpation over the tarsals, metatarsals, or phalanges. No hallux rigidus or limitus. No tenderness palpation over interphalangeal joints. No pain with compression of the metatarsal heads. Neurovascularly intact distally.  Impression and Recommendations:    Intractable right heel pain Persistent pain, partially better with topical nitroglycerin, heel lift, boot, rehab. At this point we are going to consider an injection, but I would like an MRI first to ensure this is insertional Achilles tendinitis, versus retrocalcaneal bursitis, or flexor pollicis longus tendinitis. X-rays today, hopefully MRI this afternoon.  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 We may consider an ultrasound-guided flexor pollicis longus tendon sheath injection versus an ultrasound-guided retrocalcaneal bursa injection followed by 1 week of boot immobilization at the follow-up.   ___________________________________________ Gwen Her. Dianah Field, M.D., ABFM., CAQSM. Primary Care and Sports Medicine [facility]  Adjunct Professor of [employer] [facility] of Medicine

## 2019-02-14 NOTE — Addendum Note (Signed)
Addended by: Silverio Decamp on: 02/14/2019 02:59 PM   Modules accepted: Orders

## 2019-02-15 ENCOUNTER — Encounter: Payer: Self-pay | Admitting: Sports Medicine

## 2019-02-15 ENCOUNTER — Ambulatory Visit (INDEPENDENT_AMBULATORY_CARE_PROVIDER_SITE_OTHER): Payer: Managed Care, Other (non HMO)

## 2019-02-15 ENCOUNTER — Ambulatory Visit (INDEPENDENT_AMBULATORY_CARE_PROVIDER_SITE_OTHER): Payer: Managed Care, Other (non HMO) | Admitting: Sports Medicine

## 2019-02-15 DIAGNOSIS — M79671 Pain in right foot: Secondary | ICD-10-CM | POA: Diagnosis not present

## 2019-02-15 DIAGNOSIS — M84375A Stress fracture, left foot, initial encounter for fracture: Secondary | ICD-10-CM | POA: Insufficient documentation

## 2019-02-15 DIAGNOSIS — M7989 Other specified soft tissue disorders: Secondary | ICD-10-CM

## 2019-02-15 NOTE — Assessment & Plan Note (Signed)
With pain dorsally for the second through fourth metatarsal shaft. Mild swelling. X-rays, she will wear more rigid soled shoes, I think this is early metatarsal stress reaction.

## 2019-02-15 NOTE — Progress Notes (Signed)
Subjective:    CC: MRI follow-up  HPI: This is a pleasant 48 year old female, we have been treating her for intractable right heel pain, she has had multiple interventions including physical therapy, boot immobilization, heel lifts, topical nitroglycerin, everything improved her symptoms to some degree but not completely.  We obtained an MRI the results of which will be dictated below.  Pain is directly at the Achilles insertion, and not so much at the posterior tibiotalar joint.  In addition over the past few weeks to months she is noted pain over the dorsum of her left foot, second through fourth metatarsal shafts, no trauma.  I reviewed the past medical history, family history, social history, surgical history, and allergies today and no changes were needed.  Please see the problem list section below in epic for further details.  Past Medical History: Past Medical History:  Diagnosis Date  . Allergic rhinitis   . Dyslipidemia   . Dysmenorrhea   . Gall bladder disease   . Infertility, female   . Obesity   . Polycystic ovary disease   . 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 LAPAROSCOPIC CHOLECYSTECTOMY    . SLEEVE GASTROPLASTY    . SUPRAVENTRICULAR TACHYCARDIA ABLATION N/A 07/23/2011   Procedure: SUPRAVENTRICULAR TACHYCARDIA ABLATION;  Surgeon: Evans Lance, MD;  Location: [facility] CATH LAB;  Service: Cardiovascular;  Laterality: N/A;  . transthoracic echcardiogram  09/15/06   Social History: Social History   Socioeconomic History  . Marital status: Married    Spouse name: matthew  . Number of children: 5  . Years of education: Not on file  . Highest education level: Not on file  Occupational History  . Occupation: Surveyor, quantity: [employer]  . Financial resource strain: Not on file  . Food insecurity    Worry: Not on file    Inability: Not on file  .  Transportation needs    Medical: Not on file    Non-medical: Not on file  Tobacco Use  . Smoking status: Former Smoker    Quit date: 07/15/2002    Years since quitting: 16.6  . Smokeless tobacco: Never Used  Substance and Sexual Activity  . Alcohol use: No    Alcohol/week: 0.0 standard drinks  . Drug use: No  . Sexual activity: Yes    Partners: Male  Lifestyle  . Physical activity    Days per week: Not on file    Minutes per session: Not on file  . Stress: Not on file  Relationships  . Social Herbalist on phone: Not on file    Gets together: Not on file    Attends religious service: Not on file    Active member of club or organization: Not on file    Attends meetings of clubs or organizations: Not on file    Relationship status: Not on file  Other Topics Concern  . Not on file  Social History Narrative   Some exercise.  In bariatric program at [facility]    Family History: Family History  Problem Relation Age of Onset  . Colon cancer Mother 49  . Breast cancer Mother   . Diabetes Mother   . Heart attack Mother        AMI, in late 93's  . Depression Mother   . Heart disease Mother   . Hypertension Father   .  Thyroid disease Brother   . Thyroid disease Sister    Allergies: Allergies  Allergen Reactions  . Morphine Itching  . Neomycin   . Penicillins Hives   Medications: See med rec.  Review of Systems: No fevers, chills, night sweats, weight loss, chest pain, or shortness of breath.   Objective:    General: Well Developed, well nourished, and in no acute distress.  Neuro: Alert and oriented x3, extra-ocular muscles intact, sensation grossly intact.  HEENT: Normocephalic, atraumatic, pupils equal round reactive to light, neck supple, no masses, no lymphadenopathy, thyroid nonpalpable.  Skin: Warm and dry, no rashes. Cardiac: Regular rate and rhythm, no murmurs rubs or gallops, no lower extremity edema.  Respiratory: Clear to auscultation  bilaterally. Not using accessory muscles, speaking in full sentences. Left foot: Mild swelling over the dorsal midfoot Range of motion is full in all directions. Strength is 5/5 in all directions. No hallux valgus. No pes cavus or pes planus. No abnormal callus noted. No pain over the navicular prominence, or base of fifth metatarsal. No tenderness to palpation of the calcaneal insertion of plantar fascia. No pain at the Achilles insertion. No pain over the calcaneal bursa. No pain of the retrocalcaneal bursa. Tender palpation over the second through fourth metatarsals No hallux rigidus or limitus. No tenderness palpation over interphalangeal joints. No pain with compression of the metatarsal heads. Neurovascularly intact distally.  MRI personally reviewed, she has an os trigonum, I do not see any T2 edema in the trigone or the talus, she has mild flexor hallucis longus tendinitis, as well as a sliver of T2 signal in the retrocalcaneal bursa.  Impression and Recommendations:    Intractable right heel pain Persistent posterior ankle pain, partially better with topical nitroglycerin, heel lifts, cam boot, physical therapy. Clinically this represents an insertional Achilles tendinitis versus retrocalcaneal bursitis. We did obtain an MRI yesterday that showed flexor hallucis longus tendinopathy, and os trigonum without much T2 edema, as well as a sliver of increased T2 signal in the retrocalcaneal bursa. She will continue the boot with a heel lift for now, I would like a second opinion from Dr. Lucia Gaskins. Certainly we could consider an injection with ultrasound guidance into the retrocalcaneal bursa followed by approximately 1 week of boot immobilization to protect the Achilles.  Swelling of left foot With pain dorsally for the second through fourth metatarsal shaft. Mild swelling. X-rays, she will wear more rigid soled shoes, I think this is early metatarsal stress reaction.    ___________________________________________ Gwen Her. Dianah Field, M.D., ABFM., CAQSM. Primary Care and Sports Medicine [facility]  Adjunct Professor of [place] of [facility] of Medicine

## 2019-02-15 NOTE — Assessment & Plan Note (Signed)
Persistent posterior ankle pain, partially better with topical nitroglycerin, heel lifts, cam boot, physical therapy. Clinically this represents an insertional Achilles tendinitis versus retrocalcaneal bursitis. We did obtain an MRI yesterday that showed flexor hallucis longus tendinopathy, and os trigonum without much T2 edema, as well as a sliver of increased T2 signal in the retrocalcaneal bursa. She will continue the boot with a heel lift for now, I would like a second opinion from Dr. Lucia Gaskins. Certainly we could consider an injection with ultrasound guidance into the retrocalcaneal bursa followed by approximately 1 week of boot immobilization to protect the Achilles.

## 2019-02-23 ENCOUNTER — Other Ambulatory Visit: Payer: Self-pay

## 2019-02-23 ENCOUNTER — Encounter: Payer: Self-pay | Admitting: Sports Medicine

## 2019-02-23 ENCOUNTER — Ambulatory Visit (INDEPENDENT_AMBULATORY_CARE_PROVIDER_SITE_OTHER): Payer: Managed Care, Other (non HMO) | Admitting: Sports Medicine

## 2019-02-23 DIAGNOSIS — M7989 Other specified soft tissue disorders: Secondary | ICD-10-CM | POA: Diagnosis not present

## 2019-02-23 DIAGNOSIS — M79671 Pain in right foot: Secondary | ICD-10-CM | POA: Diagnosis not present

## 2019-02-23 MED ORDER — TRAMADOL HCL 50 MG PO TABS: 50.0000 mg | ORAL_TABLET | Freq: Three times a day (TID) | ORAL | 0 refills | Status: DC | PRN

## 2019-02-23 NOTE — Progress Notes (Signed)
Subjective:    CC: Follow-up  HPI: Right heel pain: Saw Dr. Lucia Gaskins, she is in a different boot now.  Left foot pain: Suspected second through fourth metatarsal stress injury, doing better with rigid soled shoes.  I reviewed the past medical history, family history, social history, surgical history, and allergies today and no changes were needed.  Please see the problem list section below in epic for further details.  Past Medical History: Past Medical History:  Diagnosis Date  . Allergic rhinitis   . Dyslipidemia   . Dysmenorrhea   . Gall bladder disease   . Infertility, female   . Obesity   . Polycystic ovary disease   . 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 LAPAROSCOPIC CHOLECYSTECTOMY    . SLEEVE GASTROPLASTY    . SUPRAVENTRICULAR TACHYCARDIA ABLATION N/A 07/23/2011   Procedure: SUPRAVENTRICULAR TACHYCARDIA ABLATION;  Surgeon: Evans Lance, MD;  Location: [facility] CATH LAB;  Service: Cardiovascular;  Laterality: N/A;  . transthoracic echcardiogram  09/15/06   Social History: Social History   Socioeconomic History  . Marital status: Married    Spouse name: matthew  . Number of children: 5  . Years of education: Not on file  . Highest education level: Not on file  Occupational History  . Occupation: Surveyor, quantity: [employer]  . Financial resource strain: Not on file  . Food insecurity    Worry: Not on file    Inability: Not on file  . Transportation needs    Medical: Not on file    Non-medical: Not on file  Tobacco Use  . Smoking status: Former Smoker    Quit date: 07/15/2002    Years since quitting: 16.6  . Smokeless tobacco: Never Used  Substance and Sexual Activity  . Alcohol use: No    Alcohol/week: 0.0 standard drinks  . Drug use: No  . Sexual activity: Yes    Partners: Male  Lifestyle  . Physical activity    Days per week: Not on file     Minutes per session: Not on file  . Stress: Not on file  Relationships  . Social Herbalist on phone: Not on file    Gets together: Not on file    Attends religious service: Not on file    Active member of club or organization: Not on file    Attends meetings of clubs or organizations: Not on file    Relationship status: Not on file  Other Topics Concern  . Not on file  Social History Narrative   Some exercise.  In bariatric program at [facility].    Family History: Family History  Problem Relation Age of Onset  . Colon cancer Mother 39  . Breast cancer Mother   . Diabetes Mother   . Heart attack Mother        AMI, in late 75's  . Depression Mother   . Heart disease Mother   . Hypertension Father   . Thyroid disease Brother   . Thyroid disease Sister    Allergies: Allergies  Allergen Reactions  . Morphine Itching  . Neomycin   . Penicillins Hives   Medications: See med rec.  Review of Systems: No fevers, chills, night sweats, weight loss, chest pain, or shortness of breath.   Objective:    General: Well Developed, well nourished, and in no acute  distress.  Neuro: Alert and oriented x3, extra-ocular muscles intact, sensation grossly intact.  HEENT: Normocephalic, atraumatic, pupils equal round reactive to light, neck supple, no masses, no lymphadenopathy, thyroid nonpalpable.  Skin: Warm and dry, no rashes. Cardiac: Regular rate and rhythm, no murmurs rubs or gallops, no lower extremity edema.  Respiratory: Clear to auscultation bilaterally. Not using accessory muscles, speaking in full sentences.  Impression and Recommendations:    Intractable right heel pain Posterior ankle pain, partially better with topical nitroglycerin, heel lifts, Dr. Lucia Gaskins has placed her in a more extensive cam boot. Continue rehab exercises. MRI showed FHL tendinopathy, os trigonum without much T2 edema as well as a sliver of increased T2 signal in the retrocalcaneal bursa.  Clinically this represents a retrocalcaneal bursitis. For now we will hold off on any injections.  Swelling of left foot Doing much better with rigid soled shoes, x-rays were unremarkable, she does have pain dorsally at the second through fourth metatarsal shafts more consistent with a stress injury. I did advise her that she needs to wear rigid soled shoes for the next 3 months. Adding some tramadol for pain relief in the meantime   ___________________________________________ Gwen Her. Dianah Field, M.D., ABFM., CAQSM. Primary Care and Sports Medicine [facility]  Adjunct Professor of [place] of [facility] of Medicine

## 2019-02-23 NOTE — Assessment & Plan Note (Signed)
Doing much better with rigid soled shoes, x-rays were unremarkable, she does have pain dorsally at the second through fourth metatarsal shafts more consistent with a stress injury. I did advise her that she needs to wear rigid soled shoes for the next 3 months. Adding some tramadol for pain relief in the meantime

## 2019-02-23 NOTE — Assessment & Plan Note (Signed)
Posterior ankle pain, partially better with topical nitroglycerin, heel lifts, Dr. Lucia Gaskins has placed her in a more extensive cam boot. Continue rehab exercises. MRI showed FHL tendinopathy, os trigonum without much T2 edema as well as a sliver of increased T2 signal in the retrocalcaneal bursa. Clinically this represents a retrocalcaneal bursitis. For now we will hold off on any injections.

## 2019-02-28 ENCOUNTER — Ambulatory Visit (INDEPENDENT_AMBULATORY_CARE_PROVIDER_SITE_OTHER): Payer: Managed Care, Other (non HMO)

## 2019-02-28 ENCOUNTER — Other Ambulatory Visit: Payer: Self-pay

## 2019-02-28 ENCOUNTER — Encounter: Payer: Self-pay | Admitting: Sports Medicine

## 2019-02-28 ENCOUNTER — Ambulatory Visit (INDEPENDENT_AMBULATORY_CARE_PROVIDER_SITE_OTHER): Payer: Managed Care, Other (non HMO) | Admitting: Sports Medicine

## 2019-02-28 DIAGNOSIS — M79672 Pain in left foot: Secondary | ICD-10-CM

## 2019-02-28 DIAGNOSIS — W19XXXA Unspecified fall, initial encounter: Secondary | ICD-10-CM

## 2019-02-28 DIAGNOSIS — M25562 Pain in left knee: Secondary | ICD-10-CM

## 2019-02-28 DIAGNOSIS — M1711 Unilateral primary osteoarthritis, right knee: Secondary | ICD-10-CM

## 2019-02-28 NOTE — Assessment & Plan Note (Signed)
Fall, twisted her knee, she does have mild effusion, inverted the left foot, pain over the dorsal midfoot. We are going to do foot x-rays, I reaction knee brace, knee x-rays. She has tramadol as needed. Return to see me in 2 weeks for this. I think she just sprained her knee and her foot.

## 2019-02-28 NOTE — Progress Notes (Signed)
Subjective:    CC: Fall  HPI: This is a pleasant 48 year old female, recently she was trying to change a light fixture, took a misstep, and fell to the left, she inverted her left foot, impacted her left knee.  Pain is moderate in the dorsal lateral midfoot, and severe in the left knee with swelling and difficulty bending her knee past 90 degrees.  I reviewed the past medical history, family history, social history, surgical history, and allergies today and no changes were needed.  Please see the problem list section below in epic for further details.  Past Medical History: Past Medical History:  Diagnosis Date  . Allergic rhinitis   . Dyslipidemia   . Dysmenorrhea   . Gall bladder disease   . Infertility, female   . Obesity   . Polycystic ovary disease   . 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 LAPAROSCOPIC CHOLECYSTECTOMY    . SLEEVE GASTROPLASTY    . SUPRAVENTRICULAR TACHYCARDIA ABLATION N/A 07/23/2011   Procedure: SUPRAVENTRICULAR TACHYCARDIA ABLATION;  Surgeon: Evans Lance, MD;  Location: [facility] CATH LAB;  Service: Cardiovascular;  Laterality: N/A;  . transthoracic echcardiogram  09/15/06   Social History: Social History   Socioeconomic History  . Marital status: Married    Spouse name: matthew  . Number of children: 5  . Years of education: Not on file  . Highest education level: Not on file  Occupational History  . Occupation: Surveyor, quantity: [employer]  . Financial resource strain: Not on file  . Food insecurity    Worry: Not on file    Inability: Not on file  . Transportation needs    Medical: Not on file    Non-medical: Not on file  Tobacco Use  . Smoking status: Former Smoker    Quit date: 07/15/2002    Years since quitting: 16.6  . Smokeless tobacco: Never Used  Substance and Sexual Activity  . Alcohol use: No    Alcohol/week: 0.0  standard drinks  . Drug use: No  . Sexual activity: Yes    Partners: Male  Lifestyle  . Physical activity    Days per week: Not on file    Minutes per session: Not on file  . Stress: Not on file  Relationships  . Social Herbalist on phone: Not on file    Gets together: Not on file    Attends religious service: Not on file    Active member of club or organization: Not on file    Attends meetings of clubs or organizations: Not on file    Relationship status: Not on file  Other Topics Concern  . Not on file  Social History Narrative   Some exercise.  In bariatric program at [facility]    Family History: Family History  Problem Relation Age of Onset  . Colon cancer Mother 67  . Breast cancer Mother   . Diabetes Mother   . Heart attack Mother        AMI, in late 6's  . Depression Mother   . Heart disease Mother   . Hypertension Father   . Thyroid disease Brother   . Thyroid disease Sister    Allergies: Allergies  Allergen Reactions  . Morphine Itching  . Neomycin   . Penicillins Hives   Medications: See med rec.  Review of Systems: No  fevers, chills, night sweats, weight loss, chest pain, or shortness of breath.   Objective:    General: Well Developed, well nourished, and in no acute distress.  Neuro: Alert and oriented x3, extra-ocular muscles intact, sensation grossly intact.  HEENT: Normocephalic, atraumatic, pupils equal round reactive to light, neck supple, no masses, no lymphadenopathy, thyroid nonpalpable.  Skin: Warm and dry, no rashes. Cardiac: Regular rate and rhythm, no murmurs rubs or gallops, no lower extremity edema.  Respiratory: Clear to auscultation bilaterally. Not using accessory muscles, speaking in full sentences. Left knee: Visibly swollen with a mild effusion ROM normal in flexion and extension and lower leg rotation. Ligaments with solid consistent endpoints including ACL, PCL, LCL, MCL. Negative Mcmurray's and provocative  meniscal tests. Non painful patellar compression. Patellar and quadriceps tendons unremarkable. Hamstring and quadriceps strength is normal. Left foot: No visible erythema or swelling. Range of motion is full in all directions. Strength is 5/5 in all directions. No hallux valgus. No pes cavus or pes planus. No abnormal callus noted. No pain over the navicular prominence, or base of fifth metatarsal. No tenderness to palpation of the calcaneal insertion of plantar fascia. No pain at the Achilles insertion. No pain over the calcaneal bursa. No pain of the retrocalcaneal bursa. Tender to palpation over the dorsal lateral midfoot No hallux rigidus or limitus. No tenderness palpation over interphalangeal joints. No pain with compression of the metatarsal heads. Neurovascularly intact distally.  Impression and Recommendations:    Fall Fall, twisted her knee, she does have mild effusion, inverted the left foot, pain over the dorsal midfoot. We are going to do foot x-rays, I reaction knee brace, knee x-rays. She has tramadol as needed. Return to see me in 2 weeks for this. I think she just sprained her knee and her foot.   ___________________________________________ Gwen Her. Dianah Field, M.D., ABFM., CAQSM. Primary Care and Sports Medicine [facility]  Adjunct Professor of [employer] [facility] of [employer]

## 2019-03-17 ENCOUNTER — Ambulatory Visit (INDEPENDENT_AMBULATORY_CARE_PROVIDER_SITE_OTHER): Payer: Managed Care, Other (non HMO) | Admitting: Sports Medicine

## 2019-03-17 ENCOUNTER — Encounter: Payer: Self-pay | Admitting: Sports Medicine

## 2019-03-17 ENCOUNTER — Other Ambulatory Visit: Payer: Self-pay

## 2019-03-17 DIAGNOSIS — W19XXXD Unspecified fall, subsequent encounter: Secondary | ICD-10-CM | POA: Diagnosis not present

## 2019-03-17 DIAGNOSIS — M7989 Other specified soft tissue disorders: Secondary | ICD-10-CM | POA: Diagnosis not present

## 2019-03-17 NOTE — Assessment & Plan Note (Signed)
Persistent left dorsal midfoot pain now for greater than 6 weeks. This is in spite of of boot immobilization. X-rays were unrevealing, adding an MRI of the foot.

## 2019-03-17 NOTE — Assessment & Plan Note (Signed)
Knees are resolved now after fall.

## 2019-03-17 NOTE — Progress Notes (Signed)
Subjective:    CC: Follow-up  HPI: Terra returns, she is a pleasant 48 year old female, she had a fall, twisted her knee and her foot, knee and heel are doing well, she still has pain over the dorsal midfoot, severe, persistent, localized without radiation, she has been wearing her boot, but pain has been persistent for now greater than 6 weeks.  I reviewed the past medical history, family history, social history, surgical history, and allergies today and no changes were needed.  Please see the problem list section below in epic for further details.  Past Medical History: Past Medical History:  Diagnosis Date  . Allergic rhinitis   . Dyslipidemia   . Dysmenorrhea   . Gall bladder disease   . Infertility, female   . Obesity   . Polycystic ovary disease   . 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 LAPAROSCOPIC CHOLECYSTECTOMY    . SLEEVE GASTROPLASTY    . SUPRAVENTRICULAR TACHYCARDIA ABLATION N/A 07/23/2011   Procedure: SUPRAVENTRICULAR TACHYCARDIA ABLATION;  Surgeon: Evans Lance, MD;  Location: [facility] CATH LAB;  Service: Cardiovascular;  Laterality: N/A;  . transthoracic echcardiogram  09/15/06   Social History: Social History   Socioeconomic History  . Marital status: Married    Spouse name: matthew  . Number of children: 5  . Years of education: Not on file  . Highest education level: Not on file  Occupational History  . Occupation: Surveyor, quantity: [employer]  . Financial resource strain: Not on file  . Food insecurity    Worry: Not on file    Inability: Not on file  . Transportation needs    Medical: Not on file    Non-medical: Not on file  Tobacco Use  . Smoking status: Former Smoker    Quit date: 07/15/2002    Years since quitting: 16.6  . Smokeless tobacco: Never Used  Substance and Sexual Activity  . Alcohol use: No    Alcohol/week: 0.0  standard drinks  . Drug use: No  . Sexual activity: Yes    Partners: Male  Lifestyle  . Physical activity    Days per week: Not on file    Minutes per session: Not on file  . Stress: Not on file  Relationships  . Social Herbalist on phone: Not on file    Gets together: Not on file    Attends religious service: Not on file    Active member of club or organization: Not on file    Attends meetings of clubs or organizations: Not on file    Relationship status: Not on file  Other Topics Concern  . Not on file  Social History Narrative   Some exercise.  In bariatric program at [facility].    Family History: Family History  Problem Relation Age of Onset  . Colon cancer Mother 70  . Breast cancer Mother   . Diabetes Mother   . Heart attack Mother        AMI, in late 42's  . Depression Mother   . Heart disease Mother   . Hypertension Father   . Thyroid disease Brother   . Thyroid disease Sister    Allergies: Allergies  Allergen Reactions  . Morphine Itching  . Neomycin   . Penicillins Hives   Medications: See med rec.  Review of Systems: No fevers, chills, night  sweats, weight loss, chest pain, or shortness of breath.   Objective:    General: Well Developed, well nourished, and in no acute distress.  Neuro: Alert and oriented x3, extra-ocular muscles intact, sensation grossly intact.  HEENT: Normocephalic, atraumatic, pupils equal round reactive to light, neck supple, no masses, no lymphadenopathy, thyroid nonpalpable.  Skin: Warm and dry, no rashes. Cardiac: Regular rate and rhythm, no murmurs rubs or gallops, no lower extremity edema.  Respiratory: Clear to auscultation bilaterally. Not using accessory muscles, speaking in full sentences. Left foot: Swollen with tenderness dorsally over the midfoot Range of motion is full in all directions. Strength is 5/5 in all directions. No hallux valgus. No pes cavus or pes planus. No abnormal callus noted. No pain  over the navicular prominence, or base of fifth metatarsal. No tenderness to palpation of the calcaneal insertion of plantar fascia. No pain at the Achilles insertion. No pain over the calcaneal bursa. No pain of the retrocalcaneal bursa. No tenderness to palpation over the tarsals, metatarsals, or phalanges. No hallux rigidus or limitus. No tenderness palpation over interphalangeal joints. No pain with compression of the metatarsal heads. Neurovascularly intact distally.  Impression and Recommendations:    Swelling of left foot Persistent left dorsal midfoot pain now for greater than 6 weeks. This is in spite of of boot immobilization. X-rays were unrevealing, adding an MRI of the foot.  Fall Knees are resolved now after fall.   ___________________________________________ Gwen Her. Dianah Field, M.D., ABFM., CAQSM. Primary Care and Sports Medicine [facility]  Adjunct Professor of [place] of [facility] of Medicine

## 2019-03-22 ENCOUNTER — Telehealth: Payer: Self-pay

## 2019-03-22 NOTE — Telephone Encounter (Signed)
Received fax today from [facility] stating that MRI request was denied because pt has not had 6 weeks of treatment and follow up appt or this issues.   Note to Dr T.  If you want to do peer to peer:  Phone: [phone] Case #: [id number]

## 2019-03-22 NOTE — Telephone Encounter (Signed)
Please resubmit, symptoms started in May-June.

## 2019-03-23 NOTE — Telephone Encounter (Signed)
Bravo!

## 2019-03-23 NOTE — Telephone Encounter (Signed)
Called and spoke with 55 F., clinical review for 20 minutes for a case reconsideration.   Was able to get case approved.  Auth # [id number]  Valid: 03/18/19 until 09/14/19       FYI note to PCP. I am contacting imaging to schedule

## 2019-03-27 ENCOUNTER — Ambulatory Visit (INDEPENDENT_AMBULATORY_CARE_PROVIDER_SITE_OTHER): Payer: Managed Care, Other (non HMO)

## 2019-03-27 ENCOUNTER — Other Ambulatory Visit: Payer: Self-pay

## 2019-03-27 DIAGNOSIS — M7989 Other specified soft tissue disorders: Secondary | ICD-10-CM | POA: Diagnosis not present

## 2019-04-14 ENCOUNTER — Ambulatory Visit (INDEPENDENT_AMBULATORY_CARE_PROVIDER_SITE_OTHER): Payer: Managed Care, Other (non HMO) | Admitting: Sports Medicine

## 2019-04-14 ENCOUNTER — Encounter: Payer: Self-pay | Admitting: Sports Medicine

## 2019-04-14 ENCOUNTER — Other Ambulatory Visit: Payer: Self-pay

## 2019-04-14 DIAGNOSIS — M79671 Pain in right foot: Secondary | ICD-10-CM

## 2019-04-14 DIAGNOSIS — M84375A Stress fracture, left foot, initial encounter for fracture: Secondary | ICD-10-CM

## 2019-04-14 NOTE — Assessment & Plan Note (Signed)
Continue boot, I have advised her to go ahead and keep the care of both of her feet with Dr. Lucia Gaskins. Return to see me as needed.

## 2019-04-14 NOTE — Assessment & Plan Note (Signed)
Posterior ankle pain, it did get a little bit better with topical nitroglycerin, heel lifts, cam boot immobilization has also helped to some degree. 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 Overall doing well.  She did tweak it and had a slight recurrence of pain, she will continue nitro patches for now.

## 2019-04-14 NOTE — Progress Notes (Signed)
Subjective:    CC: Follow-up  HPI: Laura [name] returns, she is now in a boot for her left foot, ultimately MRI showed a navicular stress fracture, she is being co-managed with foot and ankle surgery.  I reviewed the past medical history, family history, social history, surgical history, and allergies today and no changes were needed.  Please see the problem list section below in epic for further details.  Past Medical History: Past Medical History:  Diagnosis Date  . Allergic rhinitis   . Dyslipidemia   . Dysmenorrhea   . Gall bladder disease   . Infertility, female   . Obesity   . Polycystic ovary disease   . 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 LAPAROSCOPIC CHOLECYSTECTOMY    . SLEEVE GASTROPLASTY    . SUPRAVENTRICULAR TACHYCARDIA ABLATION N/A 07/23/2011   Procedure: SUPRAVENTRICULAR TACHYCARDIA ABLATION;  Surgeon: Evans Lance, MD;  Location: [facility] CATH LAB;  Service: Cardiovascular;  Laterality: N/A;  . transthoracic echcardiogram  09/15/06   Social History: Social History   Socioeconomic History  . Marital status: Married    Spouse name: matthew  . Number of children: 5  . Years of education: Not on file  . Highest education level: Not on file  Occupational History  . 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 Financial resource strain: Not on file  . Food insecurity    Worry: Not on file    Inability: Not on file  . Transportation needs    Medical: Not on file    Non-medical: Not on file  Tobacco Use  . Smoking status: Former Smoker    Quit date: 07/15/2002    Years since quitting: 16.7  . Smokeless tobacco: Never Used  Substance and Sexual Activity  . Alcohol use: No    Alcohol/week: 0.0 standard drinks  . Drug use: No  . Sexual activity: Yes    Partners: Male  Lifestyle  . Physical activity    Days per week: Not on file    Minutes per  session: Not on file  . Stress: Not on file  Relationships  . Social Herbalist on phone: Not on file    Gets together: Not on file    Attends religious service: Not on file    Active member of club or organization: Not on file    Attends meetings of clubs or organizations: Not on file    Relationship status: Not on file  Other Topics Concern  . Not on file  Social History Narrative   Some exercise.  In bariatric program at [facility].    Family History: Family History  Problem Relation Age of Onset  . Colon cancer Mother 84  . Breast cancer Mother   . Diabetes Mother   . Heart attack Mother        AMI, in late 51's  . Depression Mother   . Heart disease Mother   . Hypertension Father   . Thyroid disease Brother   . Thyroid disease Sister    Allergies: Allergies  Allergen Reactions  . Morphine Itching  . Neomycin   . Penicillins Hives   Medications: See med rec.  Review of Systems: No fevers, chills, night sweats, weight loss, chest pain, or shortness of breath.   Objective:    General: Well Developed, well nourished, and in no acute distress.  Neuro: Alert and oriented x3, extra-ocular muscles intact, sensation grossly intact.  HEENT: Normocephalic, atraumatic, pupils equal round reactive to light, neck supple, no masses, no lymphadenopathy, thyroid nonpalpable.  Skin: Warm and dry, no rashes. Cardiac: Regular rate and rhythm, no murmurs rubs or gallops, no lower extremity edema.  Respiratory: Clear to auscultation bilaterally. Not using accessory muscles, speaking in full sentences.  Impression and Recommendations:    Stress fracture of navicular bone of left foot Continue boot, I have advised her to go ahead and keep the care of both of her feet with Dr. Lucia Gaskins. Return to see me as needed.  Intractable right heel pain Posterior ankle pain, it did get a little bit better with topical nitroglycerin, heel lifts, cam boot immobilization has also helped to  some degree. 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 Overall doing well.  She did tweak it and had a slight recurrence of pain, she will continue nitro patches for now.  I spent 15 minutes with this patient, greater than 50% was face-to-face time counseling regarding the above diagnoses.  ___________________________________________ Gwen Her. Dianah Field, M.D., ABFM., CAQSM. Primary Care and Sports Medicine [facility]  Adjunct Professor of [employer] of [facility] of Medicine

## 2019-04-25 ENCOUNTER — Ambulatory Visit: Payer: Managed Care, Other (non HMO) | Admitting: Family Medicine

## 2019-04-28 ENCOUNTER — Ambulatory Visit (INDEPENDENT_AMBULATORY_CARE_PROVIDER_SITE_OTHER): Payer: Managed Care, Other (non HMO) | Admitting: Family Medicine

## 2019-04-28 ENCOUNTER — Encounter: Payer: Self-pay | Admitting: Family Medicine

## 2019-04-28 ENCOUNTER — Other Ambulatory Visit: Payer: Self-pay

## 2019-04-28 VITALS — BP 108/58 | HR 56 | Ht 64.0 in | Wt 275.0 lb

## 2019-04-28 DIAGNOSIS — H938X3 Other specified disorders of ear, bilateral: Secondary | ICD-10-CM

## 2019-04-28 DIAGNOSIS — G43809 Other migraine, not intractable, without status migrainosus: Secondary | ICD-10-CM | POA: Diagnosis not present

## 2019-04-28 DIAGNOSIS — G43909 Migraine, unspecified, not intractable, without status migrainosus: Secondary | ICD-10-CM | POA: Insufficient documentation

## 2019-04-28 DIAGNOSIS — Z23 Encounter for immunization: Secondary | ICD-10-CM | POA: Diagnosis not present

## 2019-04-28 DIAGNOSIS — F4321 Adjustment disorder with depressed mood: Secondary | ICD-10-CM | POA: Diagnosis not present

## 2019-04-28 MED ORDER — ESCITALOPRAM OXALATE 10 MG PO TABS: 10.0000 mg | ORAL_TABLET | Freq: Every day | ORAL | 2 refills | Status: DC

## 2019-04-28 MED ORDER — TOPIRAMATE 25 MG PO TABS: ORAL_TABLET | ORAL | 0 refills | Status: DC

## 2019-04-28 NOTE — Progress Notes (Signed)
Established Patient Office Visit  Subjective:  Patient ID: Laura [name], female    DOB: 1971-01-18  Age: 48 y.o. MRN: [id number]  CC:  Chief Complaint  Patient presents with  . mood    HPI Laura [name] presents for follow-up for mood.  She is currently on Lexapro.  She said she did try to wean herself off and just did not do well with it and so she wants to make sure that she stays on it for now.  She is happy with her current dosing and regimen she still going to a therapist/counselor every 2 to 3 weeks.  He also let me know that she is been having more frequent headaches because she is mostly been working on a computer all day she has been getting headaches almost daily.  She like to consider going back on the Topamax as it did help her migraines previously.  She has been using some bluelight filter on her glasses.  She is also had some pressure in both ears for the last week or so.  She denies any sinus congestion or popping or cracking in the ears.  She does feel like her hearing is getting a little worse as she is gotten older.  She also is not sure if she might have some wax buildup that she has had that problem before.  Past Medical History:  Diagnosis Date  . Allergic rhinitis   . Dyslipidemia   . Dysmenorrhea   . Gall bladder disease   . Infertility, female   . Obesity   . Polycystic ovary disease   . 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 LAPAROSCOPIC CHOLECYSTECTOMY    . SLEEVE GASTROPLASTY    . SUPRAVENTRICULAR TACHYCARDIA ABLATION N/A 07/23/2011   Procedure: SUPRAVENTRICULAR TACHYCARDIA ABLATION;  Surgeon: Evans Lance, MD;  Location: [facility] CATH LAB;  Service: Cardiovascular;  Laterality: N/A;  . transthoracic echcardiogram  09/15/06    Family History  Problem Relation Age of Onset  . Colon cancer Mother 74  . Breast cancer Mother   . Diabetes Mother   . Heart attack Mother        AMI, in late 86's   . Depression Mother   . Heart disease Mother   . Hypertension Father   . Thyroid disease Brother   . Thyroid disease Sister     Social History   Socioeconomic History  . Marital status: Married    Spouse name: matthew  . Number of children: 5  . Years of education: Not on file  . Highest education level: Not on file  Occupational History  . Occupation: Surveyor, quantity: [employer]  . Financial resource strain: Not on file  . Food insecurity    Worry: Not on file    Inability: Not on file  . Transportation needs    Medical: Not on file    Non-medical: Not on file  Tobacco Use  . Smoking status: Former Smoker    Quit date: 07/15/2002    Years since quitting: 16.8  . Smokeless tobacco: Never Used  Substance and Sexual Activity  . Alcohol use: No    Alcohol/week: 0.0 standard drinks  . Drug use: No  . Sexual activity: Yes    Partners: Male  Lifestyle  . Physical activity    Days per week: Not on file    Minutes per session: Not  on file  . Stress: Not on file  Relationships  . Social Herbalist on phone: Not on file    Gets together: Not on file    Attends religious service: Not on file    Active member of club or organization: Not on file    Attends meetings of clubs or organizations: Not on file    Relationship status: Not on file  . Intimate partner violence    Fear of current or ex partner: Not on file    Emotionally abused: Not on file    Physically abused: Not on file    Forced sexual activity: Not on file  Other Topics Concern  . Not on file  Social History Narrative   Some exercise.  In bariatric program at [facility].     Outpatient Medications Prior to Visit  Medication Sig Dispense Refill  . BIOTIN 5000 PO Take 1 capsule by mouth.    . Cholecalciferol 50 MCG (2000 UT) TABS Take 1 tablet by mouth daily.    . clobetasol ointment (TEMOVATE) [id number] % Apply 1 application topically 2 (two) times daily. 60 g 1  .  ipratropium (ATROVENT) 0.06 % nasal spray Place 2 sprays into both nostrils 4 (four) times daily. 15 mL 1  . iron polysaccharides (NIFEREX) 150 MG capsule Take by mouth.    . Multiple Vitamin (THERA) TABS Take by mouth.    Marland Kitchen omeprazole (PRILOSEC) 10 MG capsule Take 4 capsules by mouth 2 (two) times daily.    . traMADol (ULTRAM) 50 MG tablet Take 1 tablet (50 mg total) by mouth every 8 (eight) hours as needed for moderate pain. Maximum 6 tabs per day. 21 tablet 0  . escitalopram (LEXAPRO) 10 MG tablet Take 1 tablet (10 mg total) by mouth daily. 90 tablet 1   No facility-administered medications prior to visit.     Allergies  Allergen Reactions  . Morphine Itching  . Neomycin   . Penicillins Hives    ROS Review of Systems    Objective:    Physical Exam  Constitutional: She is oriented to person, place, and time. She appears well-developed and well-nourished.  HENT:  Head: Normocephalic and atraumatic.  Right Ear: External ear normal.  Left Ear: External ear normal.  Nose: Nose normal.  Mouth/Throat: Oropharynx is clear and moist.  TMs and canals are clear.   Eyes: Pupils are equal, round, and reactive to light. Conjunctivae and EOM are normal.  Neck: Neck supple. No thyromegaly present.  Cardiovascular: Normal rate, regular rhythm and normal heart sounds.  Pulmonary/Chest: Effort normal and breath sounds normal. She has no wheezes.  Lymphadenopathy:    She has no cervical adenopathy.  Neurological: She is alert and oriented to person, place, and time.  Skin: Skin is warm and dry.  Psychiatric: She has a normal mood and affect.    BP (!) 108/58   Pulse (!) 56   Ht 5\' 4"  (1.626 m)   Wt 275 lb (124.7 kg)   SpO2 99%   BMI 47.20 kg/m  Wt Readings from Last 3 Encounters:  04/29/19 275 lb (124.7 kg)  03/17/19 275 lb (124.7 kg)  02/23/19 270 lb (122.5 kg)     Health Maintenance Due  Topic Date Due  . HIV Screening  03/05/1985  . INFLUENZA VACCINE  03/12/2019     There are no preventive care reminders to display for this patient.  Lab Results  Component Value Date   TSH 2.676 05/18/2012  Lab Results  Component Value Date   WBC 6.6 12/14/2017   HGB 13.3 12/14/2017   HCT 38.8 12/14/2017   MCV 88.8 12/14/2017   PLT 221 12/14/2017   Lab Results  Component Value Date   NA 139 12/14/2017   K 4.5 12/14/2017   CO2 30 12/14/2017   GLUCOSE 93 12/14/2017   BUN 9 12/14/2017   CREATININE 0.88 12/14/2017   BILITOT 0.5 12/14/2017   ALKPHOS 71 05/18/2012   AST 17 12/14/2017   ALT 21 12/14/2017   PROT 5.8 (L) 12/14/2017   ALBUMIN 3.5 05/18/2012   CALCIUM 8.5 (L) 12/14/2017   GFR 86.35 07/18/2011   Lab Results  Component Value Date   CHOL 163 09/06/2009   Lab Results  Component Value Date   HDL 30 (L) 09/06/2009   Lab Results  Component Value Date   LDLCALC 97 09/06/2009   Lab Results  Component Value Date   TRIG 182 (H) 09/06/2009   Lab Results  Component Value Date   CHOLHDL 5.4 Ratio 09/06/2009   No results found for: HGBA1C    Assessment & Plan:   Problem List Items Addressed This Visit      Cardiovascular and Mediastinum   Migraine - Primary    Discussed restarting topamax.  Has yearly eye exam coming up. Will start at 25mg  and increase to 50mg   F/U in a couple of months if need to increase dose.       Relevant Medications   escitalopram (LEXAPRO) 10 MG tablet   topiramate (TOPAMAX) 25 MG tablet     Other   Situational depression    Will refill Lexapro 10mg  daily. F/U in 6-9 months.       Relevant Medications   escitalopram (LEXAPRO) 10 MG tablet    Other Visit Diagnoses    Pressure sensation in both ears       Need for immunization against influenza       Relevant Orders   Flu Vaccine QUAD 36+ mos IM (Completed)      Bilateral ear pressure-recommend a trial of antihistamine or nasal steroid spray.  She says she actually has some Flonase at home.  May take up to 5 days to start to notice some  improvement or relief.  No sign of infection on exam and if she feels like it is not improving or getting worse then please let us know.  Meds ordered this encounter  Medications  . escitalopram (LEXAPRO) 10 MG tablet    Sig: Take 1 tablet (10 mg total) by mouth daily.    Dispense:  90 tablet    Refill:  2  . topiramate (TOPAMAX) 25 MG tablet    Sig: Ok to increase to 50mg  BID twice a day after 10 days.    Dispense:  120 tablet    Refill:  0    Follow-up: Return in about 6 months (around 10/26/2019) for mood and migraines.    Beatrice Lecher, MD

## 2019-04-28 NOTE — Assessment & Plan Note (Signed)
Will refill Lexapro 10mg  daily. F/U in 6-9 months.

## 2019-04-28 NOTE — Patient Instructions (Signed)
If you are doing well on the Topamax after 1 month and please call us and I will switch you to the 50 mg tabs so that you do not have to take so many per day.

## 2019-04-28 NOTE — Assessment & Plan Note (Signed)
Discussed restarting topamax.  Has yearly eye exam coming up. Will start at 25mg  and increase to 50mg   F/U in a couple of months if need to increase dose.

## 2019-05-10 LAB — HEPATIC FUNCTION PANEL
ALT: 27 (ref 7–35)
AST: 22 (ref 13–35)
Bilirubin, Total: 0.4

## 2019-05-10 LAB — BASIC METABOLIC PANEL
BUN: 9 (ref 4–21)
CO2: 24 — AB (ref 13–22)
Chloride: 106 (ref 99–108)
Creatinine: 0.7 (ref 0.5–1.1)
Glucose: 95
Potassium: 4.6 (ref 3.4–5.3)
Sodium: 142 (ref 137–147)

## 2019-05-10 LAB — COMPREHENSIVE METABOLIC PANEL: Calcium: 8.9 (ref 8.7–10.7)

## 2019-05-21 ENCOUNTER — Other Ambulatory Visit: Payer: Self-pay | Admitting: Family Medicine

## 2019-06-10 ENCOUNTER — Other Ambulatory Visit: Payer: Self-pay | Admitting: Physician Assistant

## 2019-06-10 DIAGNOSIS — J0110 Acute frontal sinusitis, unspecified: Secondary | ICD-10-CM

## 2019-06-20 ENCOUNTER — Other Ambulatory Visit: Payer: Self-pay | Admitting: Family Medicine

## 2019-06-27 ENCOUNTER — Other Ambulatory Visit: Payer: Self-pay | Admitting: Family Medicine

## 2019-06-27 DIAGNOSIS — Z1231 Encounter for screening mammogram for malignant neoplasm of breast: Secondary | ICD-10-CM

## 2019-06-29 DIAGNOSIS — Z9884 Bariatric surgery status: Secondary | ICD-10-CM | POA: Insufficient documentation

## 2019-07-03 ENCOUNTER — Other Ambulatory Visit: Payer: Self-pay | Admitting: Family Medicine

## 2019-07-03 DIAGNOSIS — J0110 Acute frontal sinusitis, unspecified: Secondary | ICD-10-CM

## 2019-07-14 ENCOUNTER — Other Ambulatory Visit: Payer: Self-pay | Admitting: Family Medicine

## 2019-07-25 ENCOUNTER — Other Ambulatory Visit: Payer: Self-pay | Admitting: Family Medicine

## 2019-07-26 NOTE — Telephone Encounter (Signed)
Are you ok to fill this again?

## 2019-07-27 ENCOUNTER — Other Ambulatory Visit: Payer: Self-pay | Admitting: Family Medicine

## 2019-07-27 DIAGNOSIS — J0110 Acute frontal sinusitis, unspecified: Secondary | ICD-10-CM

## 2019-07-30 ENCOUNTER — Other Ambulatory Visit: Payer: Self-pay | Admitting: Family Medicine

## 2019-07-30 DIAGNOSIS — J0110 Acute frontal sinusitis, unspecified: Secondary | ICD-10-CM

## 2019-08-03 ENCOUNTER — Ambulatory Visit (INDEPENDENT_AMBULATORY_CARE_PROVIDER_SITE_OTHER): Payer: Managed Care, Other (non HMO)

## 2019-08-03 ENCOUNTER — Other Ambulatory Visit: Payer: Self-pay

## 2019-08-03 DIAGNOSIS — Z1231 Encounter for screening mammogram for malignant neoplasm of breast: Secondary | ICD-10-CM | POA: Diagnosis not present

## 2019-08-30 IMAGING — DX LEFT FOOT - COMPLETE 3+ VIEW
3 series · 3 of 3 positions shown · non-contrast
Comparison: None.

CLINICAL DATA: Chronic pain

EXAM:
LEFT FOOT - COMPLETE 3+ VIEW

[foot ap]
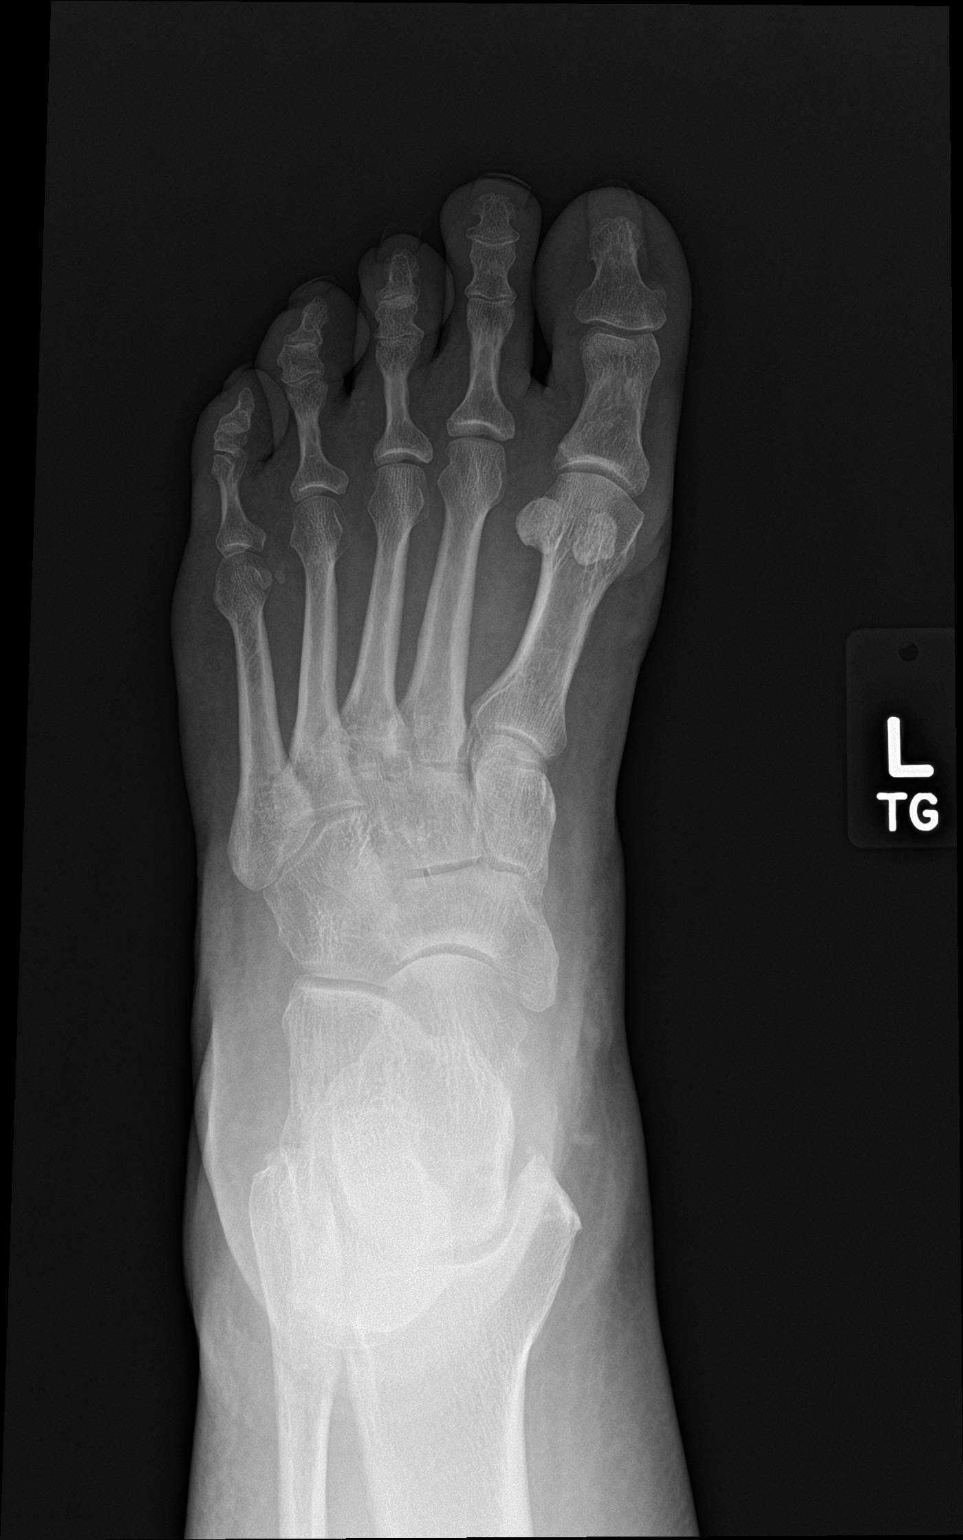

[foot obl]
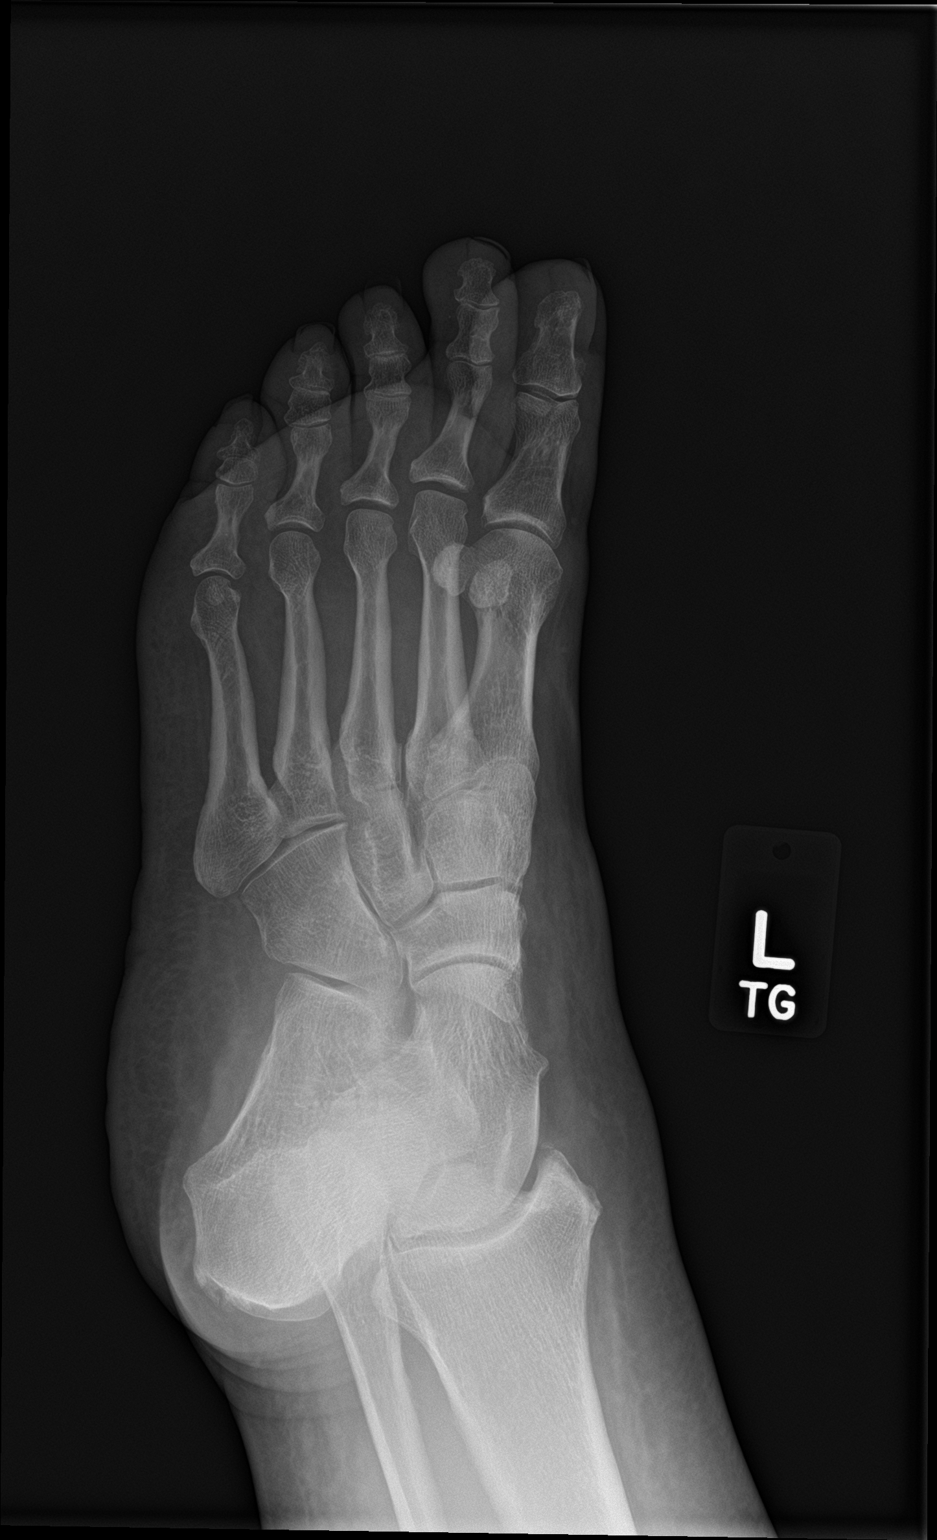

[foot lat]
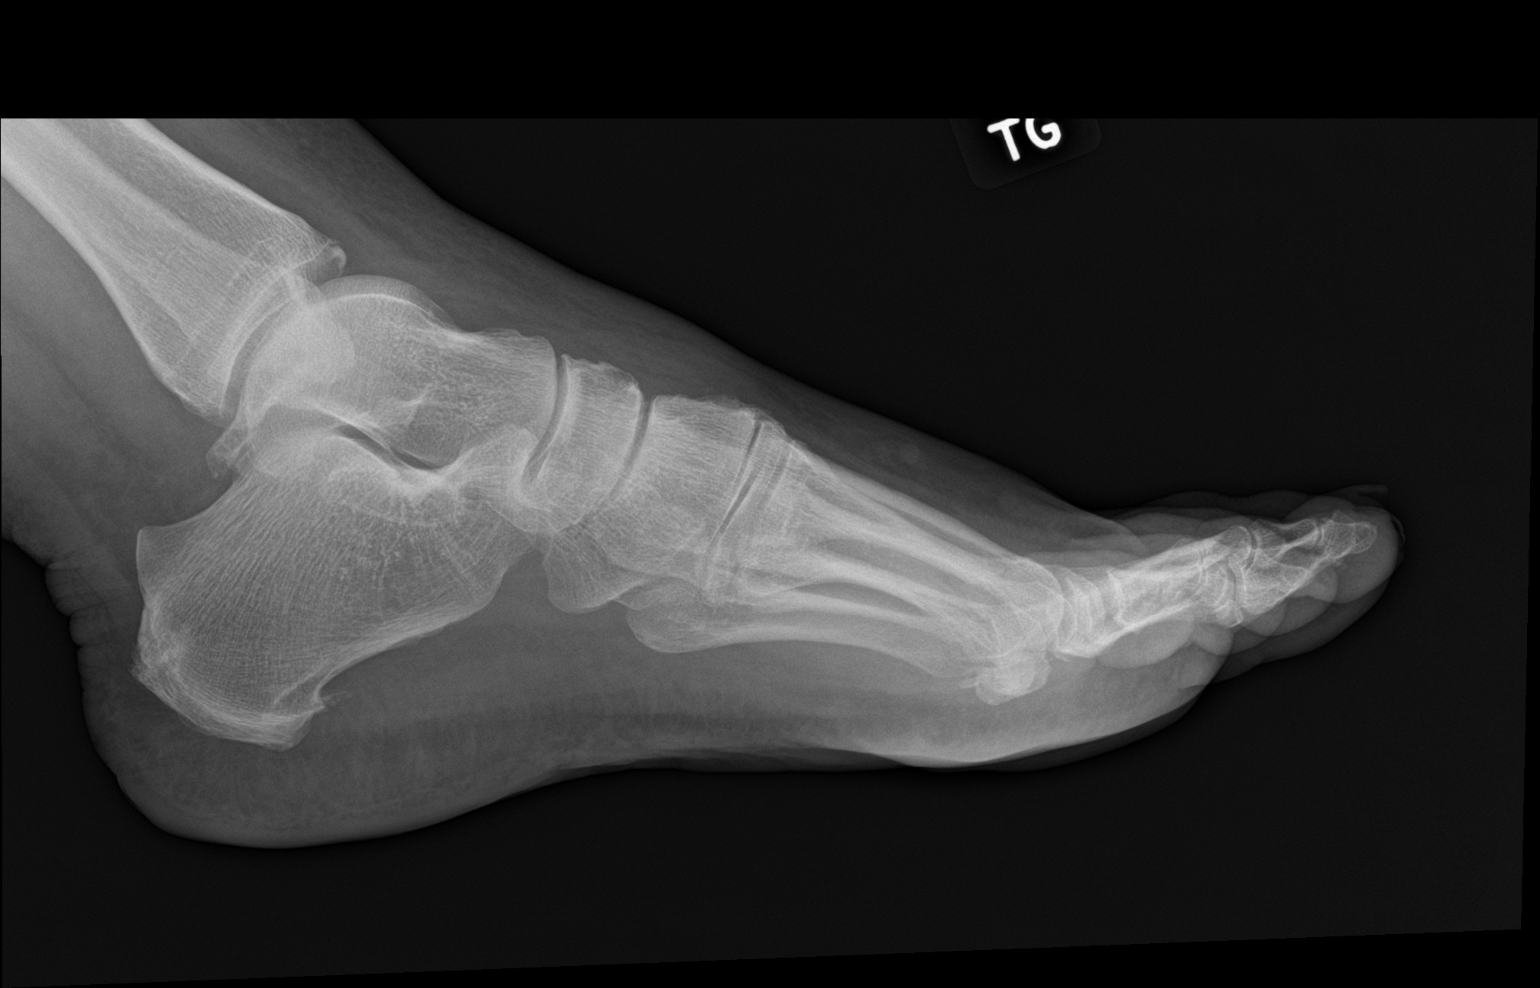

[3 of 3 positions shown; findings below may reference images not displayed]

FINDINGS: Frontal, oblique, and lateral views obtained. There is no fracture
or dislocation. Joint spaces appear unremarkable. No erosive change.
There are small posterior and inferior calcaneal spurs.
IMPRESSION: Small calcaneal spurs. No fracture or dislocation. No appreciable
arthropathy.

## 2019-09-05 LAB — LIPID PANEL
Cholesterol: 178 (ref 0–200)
HDL: 49 (ref 35–70)
LDL Cholesterol: 112
Triglycerides: 91 (ref 40–160)

## 2019-09-05 LAB — HEMOGLOBIN A1C: Hemoglobin A1C: 5

## 2019-09-12 IMAGING — DX LEFT KNEE - COMPLETE 4+ VIEW
4 series · 4 of 4 positions shown · non-contrast
Comparison: None.

CLINICAL DATA: Pain.  Fall.

EXAM:
LEFT KNEE - COMPLETE 4+ VIEW

[tunnel]
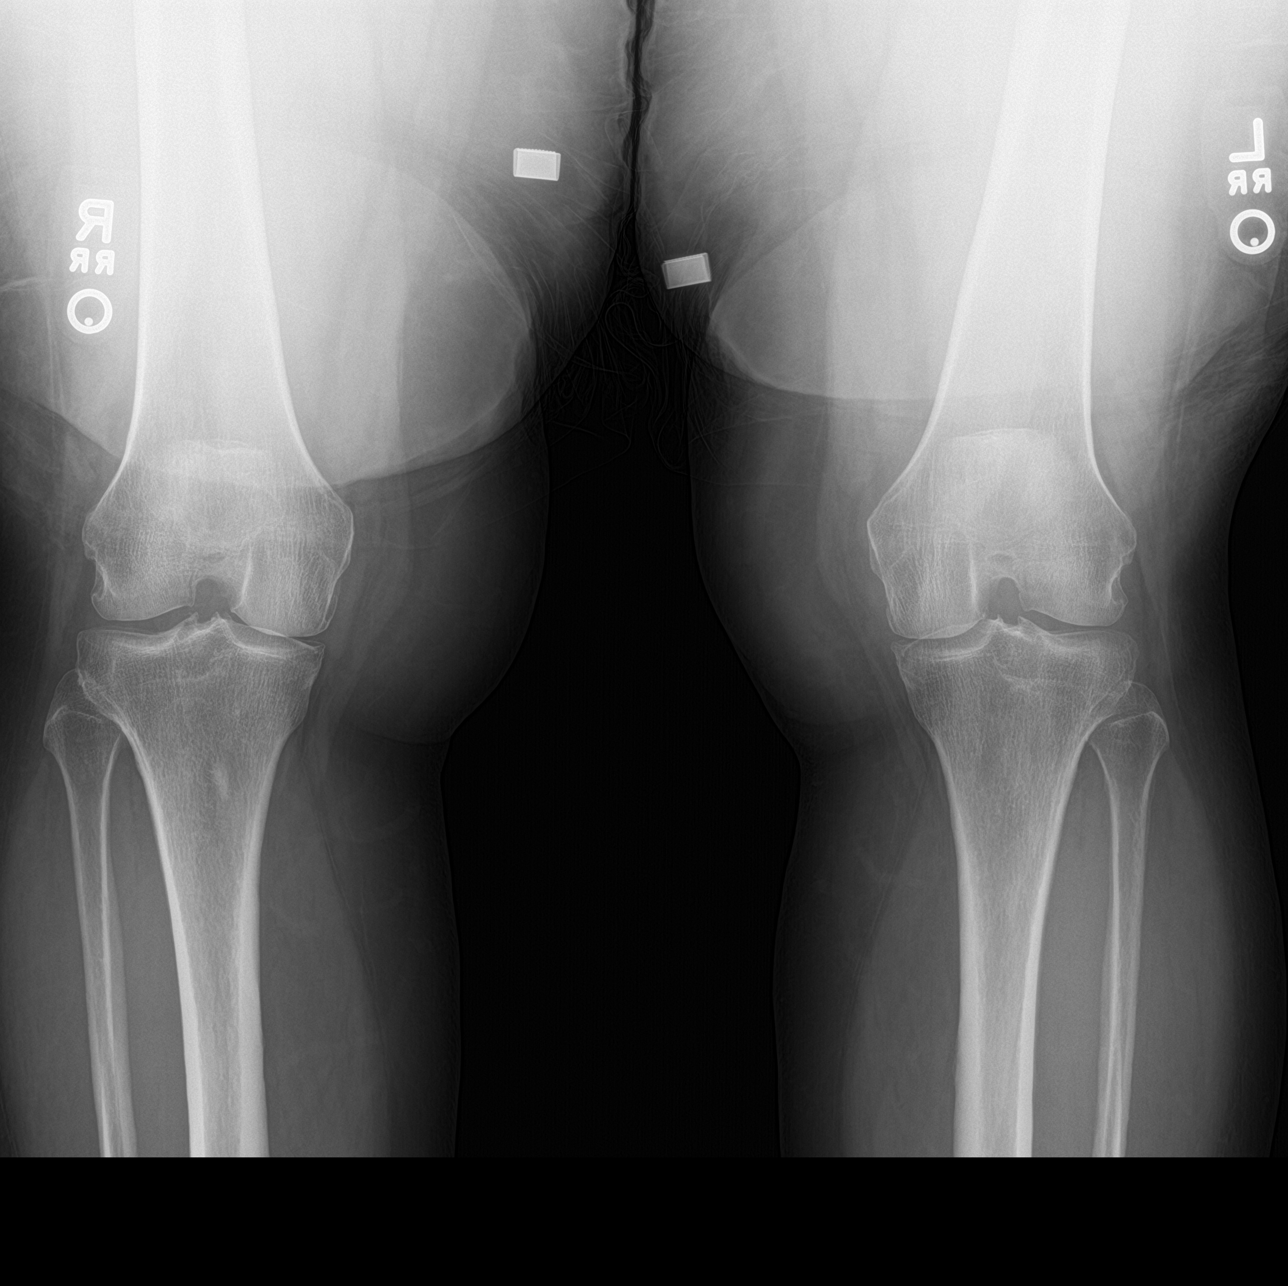

[knee lat]
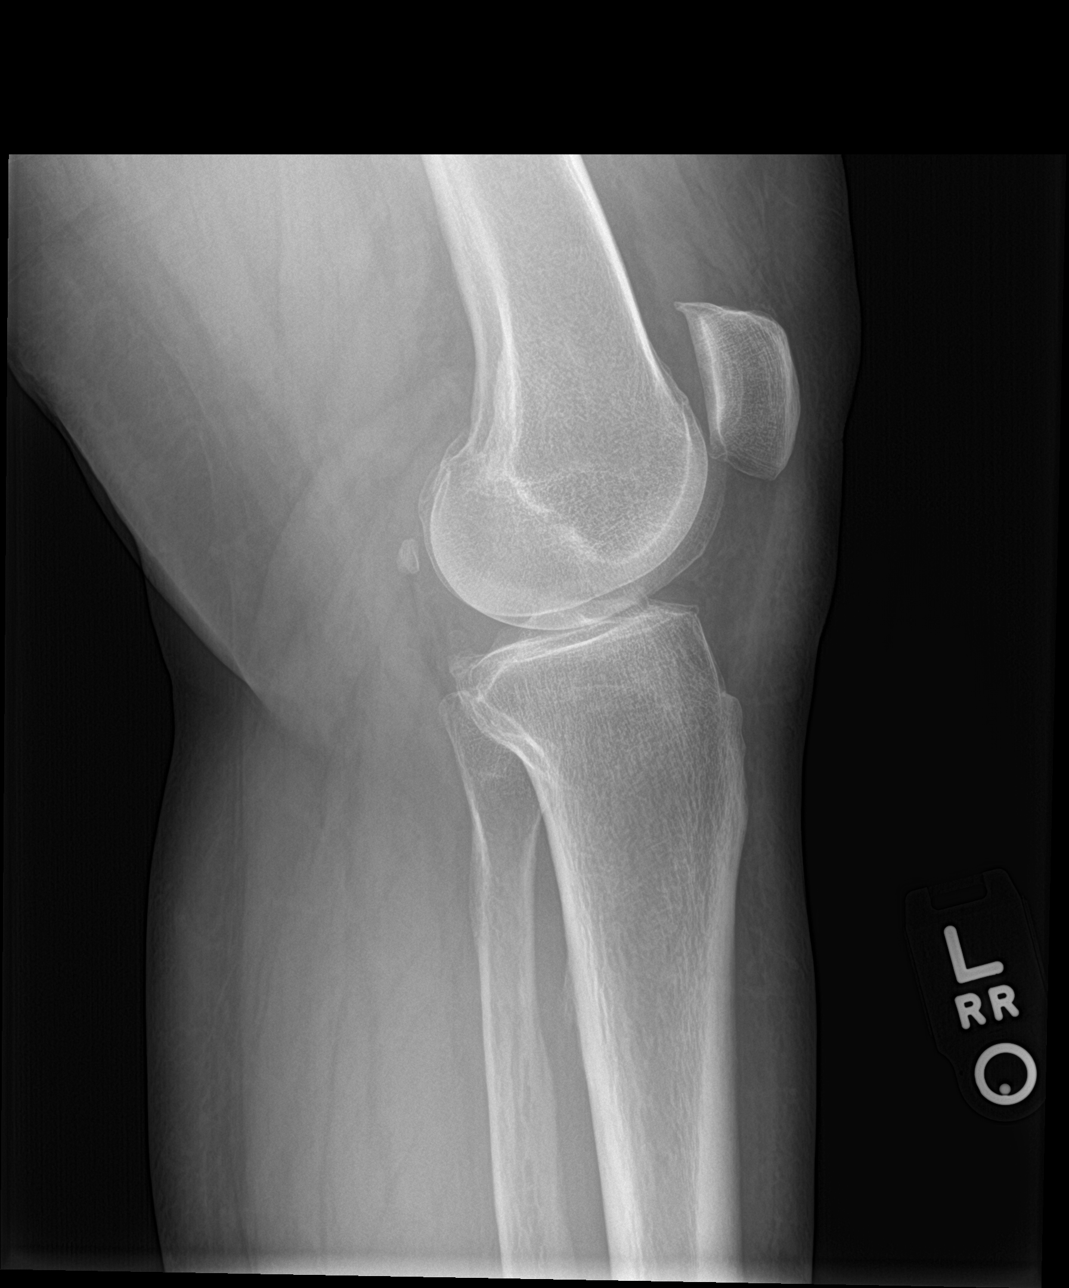

[knee sunrise]
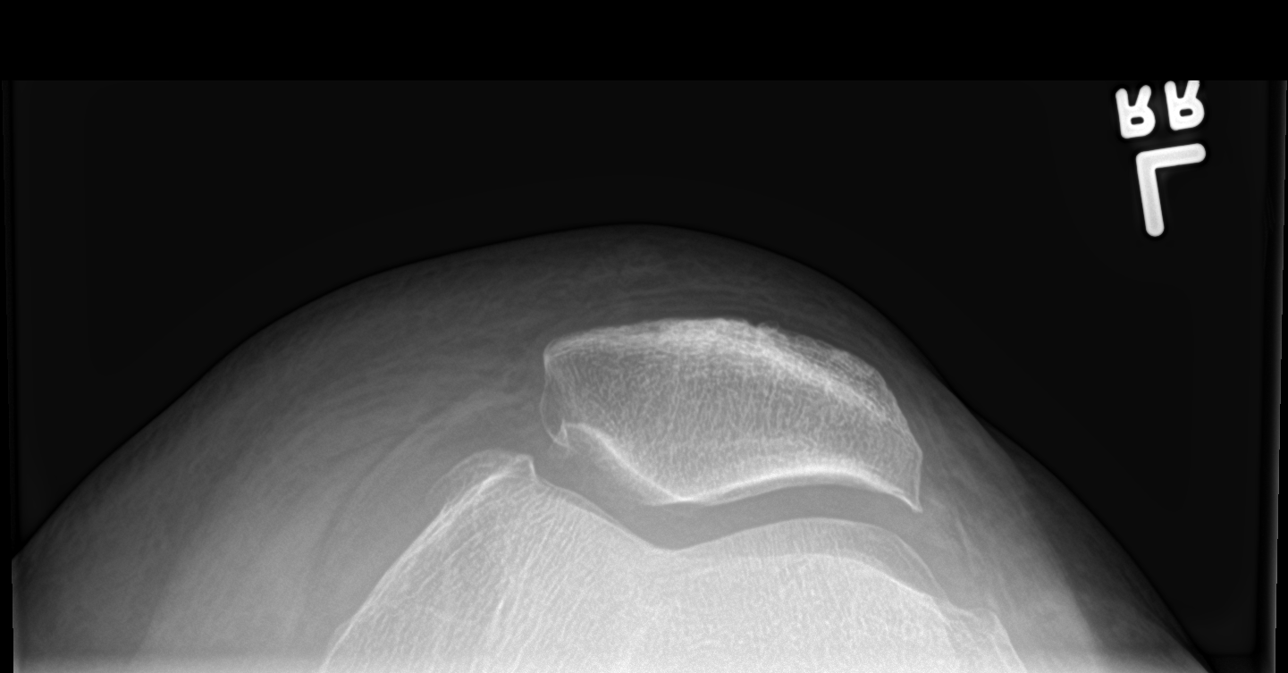

[knee ap bilat standing]
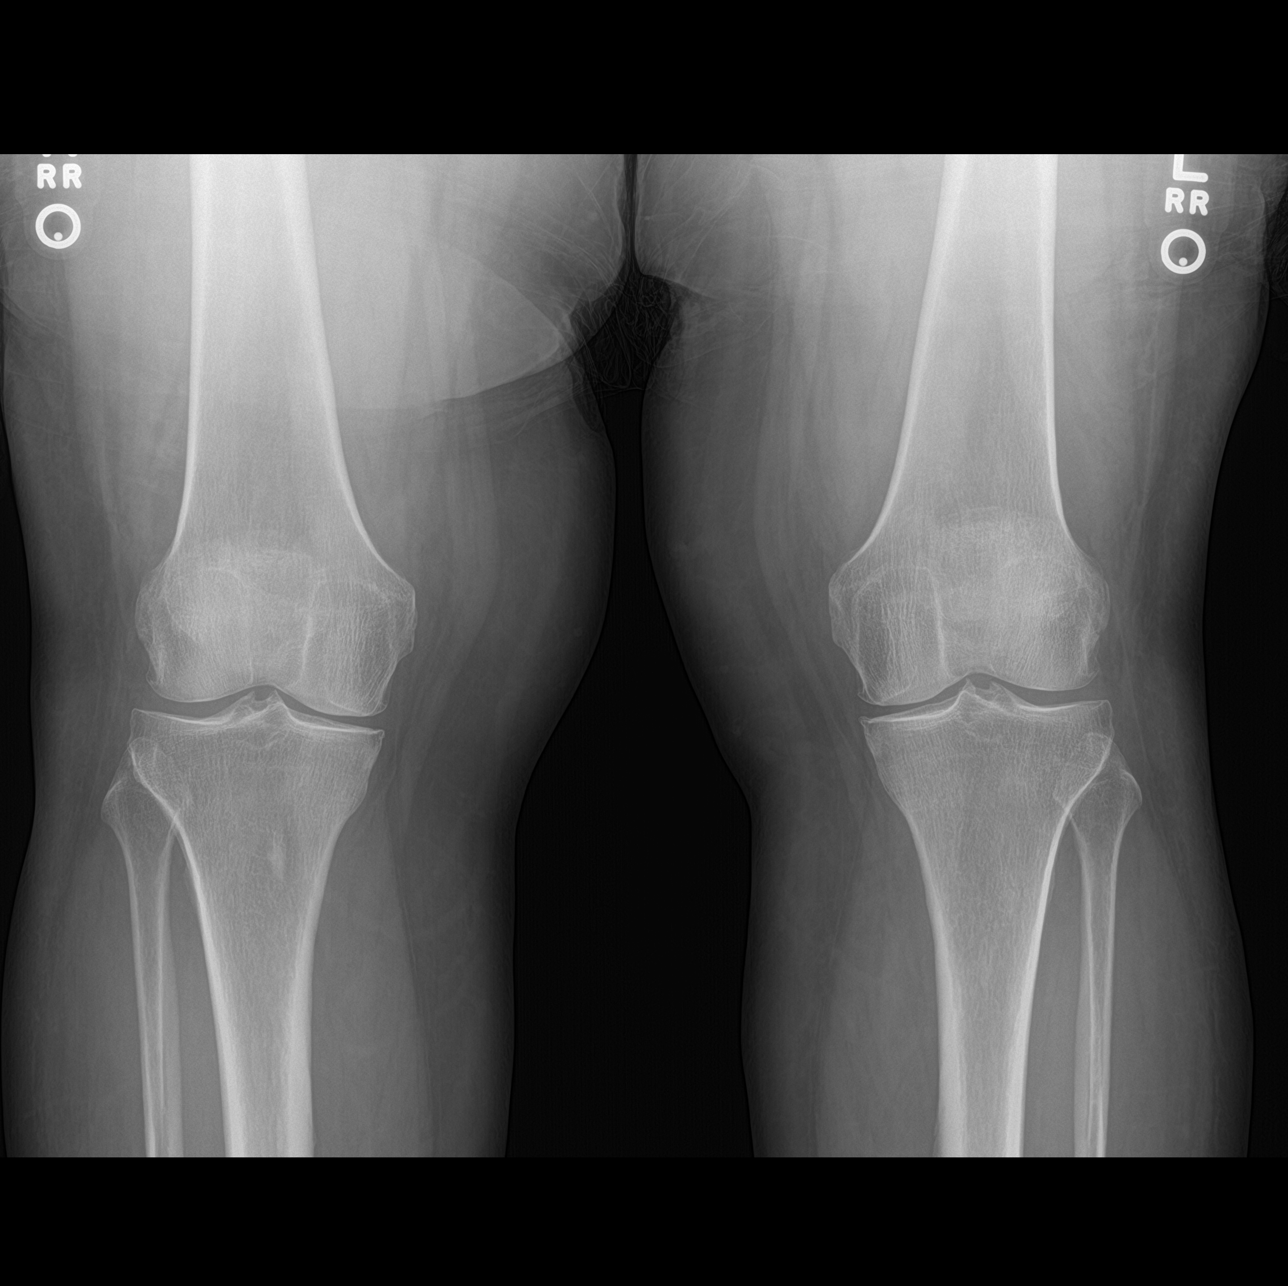

[4 of 4 positions shown; findings below may reference images not displayed]

FINDINGS: Anterior views of the right knee demonstrate degenerative changes in
the medial compartment. Multiple views of left knee demonstrate
degenerative changes in the medial and patellofemoral compartments.
No fractures or dislocations are identified. No effusions.
IMPRESSION: No fracture or effusions.  Degenerative changes.

## 2019-10-26 ENCOUNTER — Ambulatory Visit (INDEPENDENT_AMBULATORY_CARE_PROVIDER_SITE_OTHER): Payer: Managed Care, Other (non HMO) | Admitting: Family Medicine

## 2019-10-26 ENCOUNTER — Encounter: Payer: Self-pay | Admitting: Family Medicine

## 2019-10-26 ENCOUNTER — Other Ambulatory Visit: Payer: Self-pay

## 2019-10-26 VITALS — BP 104/56 | HR 65 | Ht 64.0 in | Wt 239.0 lb

## 2019-10-26 DIAGNOSIS — F4321 Adjustment disorder with depressed mood: Secondary | ICD-10-CM | POA: Diagnosis not present

## 2019-10-26 DIAGNOSIS — M545 Low back pain, unspecified: Secondary | ICD-10-CM

## 2019-10-26 DIAGNOSIS — G43809 Other migraine, not intractable, without status migrainosus: Secondary | ICD-10-CM | POA: Diagnosis not present

## 2019-10-26 DIAGNOSIS — K2100 Gastro-esophageal reflux disease with esophagitis, without bleeding: Secondary | ICD-10-CM

## 2019-10-26 DIAGNOSIS — E611 Iron deficiency: Secondary | ICD-10-CM | POA: Insufficient documentation

## 2019-10-26 MED ORDER — POLYSACCHARIDE IRON COMPLEX 150 MG PO CAPS: 150.0000 mg | ORAL_CAPSULE | Freq: Every day | ORAL | 3 refills | Status: DC

## 2019-10-26 NOTE — Progress Notes (Signed)
Established Patient Office Visit  Subjective:  Patient ID: Laura [name], female    DOB: 25-Oct-1970  Age: 49 y.o. MRN: [id number]  CC:  Chief Complaint  Patient presents with  . mood    HPI [name] presents for 51-month follow-up.  F/U Migraines -Oertli on Topamax 50 mg twice a day.  We restarted this approximately 6 months ago.    Follow-up depression-currently on Lexapro 10 mg daily.  PHQ-9 score of 2 today and GAD-7 score of 1.  She follows with psychiatry, Nathen May.  She would like her iron refilled.  That is post bariatric surgery so likely will need iron supplementation most of her life.  She does have follow-up with Dr. Volanda Napoleon coming up her surgeon.  GERD-she also wanted to let me know that she did end up restarting her omeprazole 40 mg a day.  He had tried to come off but her reflux became quite severe.  She has not quite figured out what the triggers are.  She does stay away from caffeine been only occasionally chocolate.  She does try to minimize artificial sugars.  She also wanted to let me know that she had new onset low back pain as well as significant increase in joint pain after having her her Covid vaccinations.  She has recently started seeing a chiropractor for her back and says it has been helpful.  Past Medical History:  Diagnosis Date  . Allergic rhinitis   . Dyslipidemia   . Dysmenorrhea   . Gall bladder disease   . Infertility, female   . Obesity   . Polycystic ovary disease   . 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 LAPAROSCOPIC CHOLECYSTECTOMY    . SLEEVE GASTROPLASTY    . SUPRAVENTRICULAR TACHYCARDIA ABLATION N/A 07/23/2011   Procedure: SUPRAVENTRICULAR TACHYCARDIA ABLATION;  Surgeon: Evans Lance, MD;  Location[facility] CATH LAB;  Service: Cardiovascular;  Laterality: N/A;  . transthoracic echcardiogram  09/15/06    Family History  Problem Relation Age of Onset  . Colon  cancer Mother 55  . Breast cancer Mother   . Diabetes Mother   . Heart attack Mother        AMI, in late 64's  . Depression Mother   . Heart disease Mother   . Hypertension Father   . Thyroid disease Brother   . Thyroid disease Sister     Social History   Socioeconomic History  . Marital status: Married    Spouse name: matthew  . Number of children: 5  . Years of education: Not on file  . Highest education level: Not on file  Occupational History  . Occupation: Surveyor, quantity: [employer]  Tobacco Use  . Smoking status: Former Smoker    Quit date: 07/15/2002    Years since quitting: 17.2  . Smokeless tobacco: Never Used  Substance and Sexual Activity  . Alcohol use: No    Alcohol/week: 0.0 standard drinks  . Drug use: No  . Sexual activity: Yes    Partners: Male  Other Topics Concern  . Not on file  Social History Narrative   Some exercise.  In bariatric program at [facility].    Social Determinants of Health   Financial Resource Strain:   . Difficulty of Paying Living Expenses:   Food Insecurity:   . Worried About Charity fundraiser in the Last Year:   .  Ran Out of Food in the Last Year:   Transportation Needs:   . Film/video editor (Medical):   Marland Kitchen Lack of Transportation (Non-Medical):   Physical Activity:   . Days of Exercise per Week:   . Minutes of Exercise per Session:   Stress:   . Feeling of Stress :   Social Connections:   . Frequency of Communication with Friends and Family:   . Frequency of Social Gatherings with Friends and Family:   . Attends Religious Services:   . Active Member of Clubs or Organizations:   . Attends Archivist Meetings:   Marland Kitchen Marital Status:   Intimate Partner Violence:   . Fear of Current or Ex-Partner:   . Emotionally Abused:   Marland Kitchen Physically Abused:   . Sexually Abused:     Outpatient Medications Prior to Visit  Medication Sig Dispense Refill  . BIOTIN 5000 PO Take 1 capsule by mouth.     . Cholecalciferol 50 MCG (2000 UT) TABS Take 1 tablet by mouth daily.    . clobetasol ointment (TEMOVATE) 0.05 % APPLY TO AFFECTED AREA TWICE A DAY 60 g 1  . ergocalciferol (VITAMIN D2) 1.25 MG (50000 UT) capsule Take 1 capsule by mouth 2 (two) times a week.    . escitalopram (LEXAPRO) 10 MG tablet Take 1 tablet (10 mg total) by mouth daily. 90 tablet 2  . ipratropium (ATROVENT) 0.06 % nasal spray INSTILL 2 SPRAYS INTO BOTH NOSTRILS FOUR TIMES A DAY 15 mL 1  . omeprazole (PRILOSEC) 40 MG capsule Take 40 mg by mouth daily.    . Prenatal Vit-Fe Fumarate-FA (PRENATAL VITAMIN PO) Take 1 tablet by mouth daily.    Marland Kitchen topiramate (TOPAMAX) 50 MG tablet TAKE 1 TABLET BY MOUTH TWICE A DAY 60 tablet 1  . iron polysaccharides (NIFEREX) 150 MG capsule Take by mouth.    Marland Kitchen omeprazole (PRILOSEC) 10 MG capsule Take 4 capsules by mouth 2 (two) times daily.    Marland Kitchen omeprazole (PRILOSEC) 40 MG capsule Take 40 mg by mouth daily.    . traMADol (ULTRAM) 50 MG tablet Take 1 tablet (50 mg total) by mouth every 8 (eight) hours as needed for moderate pain. Maximum 6 tabs per day. 21 tablet 0  . Multiple Vitamin (THERA) TABS Take by mouth.     No facility-administered medications prior to visit.    Allergies  Allergen Reactions  . Morphine Itching  . Neomycin   . Penicillins Hives    ROS Review of Systems    Objective:    Physical Exam  Constitutional: She is oriented to person, place, and time. She appears well-developed and well-nourished.  HENT:  Head: Normocephalic and atraumatic.  Cardiovascular: Normal rate, regular rhythm and normal heart sounds.  Pulmonary/Chest: Effort normal and breath sounds normal.  Neurological: She is alert and oriented to person, place, and time.  Skin: Skin is warm and dry.  Psychiatric: She has a normal mood and affect. Her behavior is normal.    BP (!) 104/56   Pulse 65   Ht 5\' 4"  (1.626 m)   Wt 239 lb (108.4 kg)   SpO2 100%   BMI 41.02 kg/m  Wt Readings from Last  3 Encounters:  10/26/19 239 lb (108.4 kg)  04/29/19 275 lb (124.7 kg)  03/17/19 275 lb (124.7 kg)     Health Maintenance Due  Topic Date Due  . HIV Screening  Never done    There are no preventive care reminders to display  for this patient.  Lab Results  Component Value Date   TSH 2.676 05/18/2012   Lab Results  Component Value Date   WBC 6.6 12/14/2017   HGB 13.3 12/14/2017   HCT 38.8 12/14/2017   MCV 88.8 12/14/2017   PLT 221 12/14/2017   Lab Results  Component Value Date   NA 142 05/10/2019   K 4.6 05/10/2019   CO2 24 (A) 05/10/2019   GLUCOSE 93 12/14/2017   BUN 9 05/10/2019   CREATININE 0.7 05/10/2019   BILITOT 0.5 12/14/2017   ALKPHOS 71 05/18/2012   AST 22 05/10/2019   ALT 27 05/10/2019   PROT 5.8 (L) 12/14/2017   ALBUMIN 3.5 05/18/2012   CALCIUM 8.9 05/10/2019   GFR 86.35 07/18/2011   Lab Results  Component Value Date   CHOL 178 09/05/2019   Lab Results  Component Value Date   HDL 49 09/05/2019   Lab Results  Component Value Date   LDLCALC 112 09/05/2019   Lab Results  Component Value Date   TRIG 91 09/05/2019   Lab Results  Component Value Date   CHOLHDL 5.4 Ratio 09/06/2009   Lab Results  Component Value Date   HGBA1C 5.0 09/05/2019      Assessment & Plan:   Problem List Items Addressed This Visit      Cardiovascular and Mediastinum   Migraine    Still getting about 2 headaches per week and one migraine every couple of weeks.  But she is happy with her regimen and does not want to go up on her Topamax.  Continue current regimen and follow-up in 6 months.        Digestive   GERD (gastroesophageal reflux disease)    Back on omeprazole 40 mg daily.  Discussed trying to find the foods that are really triggering her and then trying to reduce those in her diet.      Relevant Medications   omeprazole (PRILOSEC) 40 MG capsule     Other   Situational depression - Primary    Continue current regimen of Lexapro and therapy  sessions.  Follow-up in 6 months.      Low back pain    He has pain and tenderness over the left SI joint.  Seeing a chiropractor currently.  If not continuing to improve then please let us know we will get her in with one of our sports medicine docs for possible injection.      Iron deficiency    Will refill iron supplement and she will have it checked with Dr. Volanda Napoleon in a couple of weeks.      Relevant Medications   iron polysaccharides (NIFEREX) 150 MG capsule      Meds ordered this encounter  Medications  . iron polysaccharides (NIFEREX) 150 MG capsule    Sig: Take 1 capsule (150 mg total) by mouth daily.    Dispense:  90 capsule    Refill:  3    Follow-up: Return in about 6 months (around 04/27/2020) for migraines and mood.    Beatrice Lecher, MD

## 2019-10-26 NOTE — Assessment & Plan Note (Signed)
Still getting about 2 headaches per week and one migraine every couple of weeks.  But she is happy with her regimen and does not want to go up on her Topamax.  Continue current regimen and follow-up in 6 months.

## 2019-10-26 NOTE — Assessment & Plan Note (Signed)
Back on omeprazole 40 mg daily.  Discussed trying to find the foods that are really triggering her and then trying to reduce those in her diet.

## 2019-10-26 NOTE — Assessment & Plan Note (Signed)
Will refill iron supplement and she will have it checked with Dr. Volanda Napoleon in a couple of weeks.

## 2019-10-26 NOTE — Assessment & Plan Note (Signed)
He has pain and tenderness over the left SI joint.  Seeing a chiropractor currently.  If not continuing to improve then please let us know we will get her in with one of our sports medicine docs for possible injection.

## 2019-10-26 NOTE — Assessment & Plan Note (Signed)
Continue current regimen of Lexapro and therapy sessions.  Follow-up in 6 months.

## 2020-01-24 ENCOUNTER — Telehealth: Payer: Self-pay | Admitting: Family Medicine

## 2020-01-24 NOTE — Telephone Encounter (Signed)
Patient scheduled.

## 2020-01-24 NOTE — Telephone Encounter (Signed)
Please call patient and let her know I received preoperative risk assessment for her for her surgery.  Please have her schedule appointment for preop.

## 2020-01-29 ENCOUNTER — Other Ambulatory Visit: Payer: Self-pay | Admitting: Family Medicine

## 2020-01-29 DIAGNOSIS — F4321 Adjustment disorder with depressed mood: Secondary | ICD-10-CM

## 2020-02-21 ENCOUNTER — Encounter: Payer: Self-pay | Admitting: Family Medicine

## 2020-02-21 ENCOUNTER — Ambulatory Visit (INDEPENDENT_AMBULATORY_CARE_PROVIDER_SITE_OTHER): Payer: Managed Care, Other (non HMO) | Admitting: Family Medicine

## 2020-02-21 VITALS — BP 102/52 | HR 55 | Ht 64.0 in | Wt 240.0 lb

## 2020-02-21 DIAGNOSIS — K436 Other and unspecified ventral hernia with obstruction, without gangrene: Secondary | ICD-10-CM

## 2020-02-21 DIAGNOSIS — M79672 Pain in left foot: Secondary | ICD-10-CM | POA: Diagnosis not present

## 2020-02-21 DIAGNOSIS — Z01818 Encounter for other preprocedural examination: Secondary | ICD-10-CM | POA: Diagnosis not present

## 2020-02-21 DIAGNOSIS — M6208 Separation of muscle (nontraumatic), other site: Secondary | ICD-10-CM | POA: Diagnosis not present

## 2020-02-21 NOTE — Progress Notes (Signed)
Established Patient Office Visit  Subjective:  Patient ID: Laura [name], female    DOB: 1970/11/10  Age: 49 y.o. MRN: [id number]  CC:  Chief Complaint  Patient presents with  . Pre-op Exam    surgery scheduled for 8/19    HPI Laura [name] presents for preoperative clearance for surgery on her left foot.  She is scheduled in mid August for a left second tarsometatarsal joint arthrodesis and partial excision of the second metatarsal base and middle cuneiform.  She says she has done well with surgery in the past including gastric sleeve and gastric bypass.  She has not had any problems with anesthesia or sedation she is any problems waking up from surgery.  She denies any recent chest pain or shortness of breath.  She denies any bleeding or easy bruising.  She does not take NSAIDs, fish oil, again same, or aspirin.  She is currently going to [facility] to help lose weight.  Unfortunately her left foot has been bothering her for approximately a year.  After she had a stress fracture it healed back creating a cyst.  She says even 30 minutes of walking causes significant pain and she has to ice the foot aggressively.  Last Tdap 06/19/2014  Past Medical History:  Diagnosis Date  . Allergic rhinitis   . Dyslipidemia   . Dysmenorrhea   . Gall bladder disease   . Infertility, female   . Obesity   . Polycystic ovary disease   . 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 LAPAROSCOPIC CHOLECYSTECTOMY    . SLEEVE GASTROPLASTY    . SUPRAVENTRICULAR TACHYCARDIA ABLATION N/A 07/23/2011   Procedure: SUPRAVENTRICULAR TACHYCARDIA ABLATION;  Surgeon: Evans Lance, MD;  Location: [facility] CATH LAB;  Service: Cardiovascular;  Laterality: N/A;  . transthoracic echcardiogram  09/15/06    Family History  Problem Relation Age of Onset  . Colon cancer Mother 43  . Breast cancer Mother   . Diabetes Mother   . Heart attack Mother        AMI, in late  59's  . Depression Mother   . Heart disease Mother   . Hypertension Father   . Thyroid disease Brother   . Thyroid disease Sister     Social History   Socioeconomic History  . Marital status: Married    Spouse name: matthew  . Number of children: 5  . Years of education: Not on file  . Highest education level: Not on file  Occupational History  . Occupation: Surveyor, quantity: [employer]  Tobacco Use  . Smoking status: Former Smoker    Quit date: 07/15/2002    Years since quitting: 17.6  . Smokeless tobacco: Never Used  Substance and Sexual Activity  . Alcohol use: No    Alcohol/week: 0.0 standard drinks  . Drug use: No  . Sexual activity: Yes    Partners: Male  Other Topics Concern  . Not on file  Social History Narrative   Some exercise.  In bariatric program at [facility].    Social Determinants of Health   Financial Resource Strain:   . Difficulty of Paying Living Expenses:   Food Insecurity:   . Worried About Charity fundraiser in the Last Year:   . Arboriculturist in the Last Year:   Transportation Needs:   . Film/video editor (Medical):   Marland Kitchen Lack of  Transportation (Non-Medical):   Physical Activity:   . Days of Exercise per Week:   . Minutes of Exercise per Session:   Stress:   . Feeling of Stress :   Social Connections:   . Frequency of Communication with Friends and Family:   . Frequency of Social Gatherings with Friends and Family:   . Attends Religious Services:   . Active Member of Clubs or Organizations:   . Attends Archivist Meetings:   Marland Kitchen Marital Status:   Intimate Partner Violence:   . Fear of Current or Ex-Partner:   . Emotionally Abused:   Marland Kitchen Physically Abused:   . Sexually Abused:     Outpatient Medications Prior to Visit  Medication Sig Dispense Refill  . BIOTIN 5000 PO Take 1 capsule by mouth.    . Cholecalciferol 50 MCG (2000 UT) TABS Take 1 tablet by mouth daily.    . clobetasol ointment (TEMOVATE)  0.05 % APPLY TO AFFECTED AREA TWICE A DAY 60 g 1  . ergocalciferol (VITAMIN D2) 1.25 MG (50000 UT) capsule Take 1 capsule by mouth 2 (two) times a week.    . escitalopram (LEXAPRO) 10 MG tablet TAKE 1 TABLET BY MOUTH EVERY DAY 90 tablet 2  . ipratropium (ATROVENT) 0.06 % nasal spray INSTILL 2 SPRAYS INTO BOTH NOSTRILS FOUR TIMES A DAY 15 mL 1  . iron polysaccharides (NIFEREX) 150 MG capsule Take 1 capsule (150 mg total) by mouth daily. 90 capsule 3  . omeprazole (PRILOSEC) 40 MG capsule Take 40 mg by mouth daily.    . Prenatal Vit-Fe Fumarate-FA (PRENATAL VITAMIN PO) Take 1 tablet by mouth daily.    Marland Kitchen topiramate (TOPAMAX) 50 MG tablet TAKE 1 TABLET BY MOUTH TWICE A DAY 60 tablet 1   No facility-administered medications prior to visit.    Allergies  Allergen Reactions  . Morphine Itching  . Neomycin   . Penicillins Hives    ROS Review of Systems    Objective:    Physical Exam Constitutional:      Appearance: She is well-developed.  HENT:     Head: Normocephalic and atraumatic.     Right Ear: External ear normal.     Left Ear: External ear normal.     Nose: Nose normal.  Eyes:     Conjunctiva/sclera: Conjunctivae normal.     Pupils: Pupils are equal, round, and reactive to light.  Neck:     Thyroid: No thyromegaly.  Cardiovascular:     Rate and Rhythm: Normal rate and regular rhythm.     Heart sounds: Normal heart sounds.  Pulmonary:     Effort: Pulmonary effort is normal.     Breath sounds: Normal breath sounds. No wheezing.  Abdominal:     General: Abdomen is flat. Bowel sounds are normal.     Palpations: Abdomen is soft.     Comments: She has a diastases rectus.  But upon standing she actually has a discrete upper abdominal hernia just to the right of the midline.  It measures approximately 6 x 7 cm.  Musculoskeletal:     Cervical back: Neck supple.  Lymphadenopathy:     Cervical: No cervical adenopathy.  Skin:    General: Skin is warm and dry.  Neurological:      Mental Status: She is alert and oriented to person, place, and time.  Psychiatric:        Behavior: Behavior normal.     BP (!) 102/52   Pulse (!) 55  Ht 5\' 4"  (1.626 m)   Wt 240 lb (108.9 kg)   SpO2 99%   BMI 41.20 kg/m  Wt Readings from Last 3 Encounters:  02/21/20 240 lb (108.9 kg)  10/26/19 239 lb (108.4 kg)  04/29/19 275 lb (124.7 kg)     Health Maintenance Due  Topic Date Due  . Hepatitis C Screening  Never done  . HIV Screening  Never done  . PAP SMEAR-Modifier  01/22/2020    There are no preventive care reminders to display for this patient.  Lab Results  Component Value Date   TSH 2.676 05/18/2012   Lab Results  Component Value Date   WBC 6.6 12/14/2017   HGB 13.3 12/14/2017   HCT 38.8 12/14/2017   MCV 88.8 12/14/2017   PLT 221 12/14/2017   Lab Results  Component Value Date   NA 142 05/10/2019   K 4.6 05/10/2019   CO2 24 (A) 05/10/2019   GLUCOSE 93 12/14/2017   BUN 9 05/10/2019   CREATININE 0.7 05/10/2019   BILITOT 0.5 12/14/2017   ALKPHOS 71 05/18/2012   AST 22 05/10/2019   ALT 27 05/10/2019   PROT 5.8 (L) 12/14/2017   ALBUMIN 3.5 05/18/2012   CALCIUM 8.9 05/10/2019   GFR 86.35 07/18/2011   Lab Results  Component Value Date   CHOL 178 09/05/2019   Lab Results  Component Value Date   HDL 49 09/05/2019   Lab Results  Component Value Date   LDLCALC 112 09/05/2019   Lab Results  Component Value Date   TRIG 91 09/05/2019   Lab Results  Component Value Date   CHOLHDL 5.4 Ratio 09/06/2009   Lab Results  Component Value Date   HGBA1C 5.0 09/05/2019      Assessment & Plan:   Problem List Items Addressed This Visit      Other   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 She is cleared for surgery. She is overall low risk for  surgery. Last A1C at goal.  Discussed avoiding all blood thinning medication starting 1 week prior to surgery.  Will complete forms and fax paperwork to   Diastasis recti - benign condition.  Upper abdominal wall hernia - recommend consult with surgery for surgical repair. She will wait until fall after her foot surgery. She will call when ready for referral.     No orders of the defined types were placed in this encounter.   Follow-up: Return if symptoms worsen or fail to improve.   Time spent > 30 minin encounter and reviewing notes and completing paperwork.   Beatrice Lecher, MD

## 2020-05-01 ENCOUNTER — Ambulatory Visit (INDEPENDENT_AMBULATORY_CARE_PROVIDER_SITE_OTHER): Payer: Managed Care, Other (non HMO) | Admitting: Family Medicine

## 2020-05-01 ENCOUNTER — Encounter: Payer: Self-pay | Admitting: Family Medicine

## 2020-05-01 VITALS — BP 111/52 | HR 74 | Ht 64.0 in | Wt 240.0 lb

## 2020-05-01 DIAGNOSIS — G43809 Other migraine, not intractable, without status migrainosus: Secondary | ICD-10-CM

## 2020-05-01 DIAGNOSIS — G4700 Insomnia, unspecified: Secondary | ICD-10-CM | POA: Insufficient documentation

## 2020-05-01 DIAGNOSIS — Z23 Encounter for immunization: Secondary | ICD-10-CM | POA: Diagnosis not present

## 2020-05-01 DIAGNOSIS — F330 Major depressive disorder, recurrent, mild: Secondary | ICD-10-CM | POA: Diagnosis not present

## 2020-05-01 DIAGNOSIS — Z9109 Other allergy status, other than to drugs and biological substances: Secondary | ICD-10-CM | POA: Diagnosis not present

## 2020-05-01 MED ORDER — RIZATRIPTAN BENZOATE 10 MG PO TBDP: 10.0000 mg | ORAL_TABLET | ORAL | 3 refills | Status: DC | PRN

## 2020-05-01 MED ORDER — TOPIRAMATE 50 MG PO TABS: 50.0000 mg | ORAL_TABLET | Freq: Two times a day (BID) | ORAL | 1 refills | Status: DC

## 2020-05-01 MED ORDER — ONDANSETRON 4 MG PO TBDP: 4.0000 mg | ORAL_TABLET | Freq: Three times a day (TID) | ORAL | 0 refills | Status: DC | PRN

## 2020-05-01 NOTE — Progress Notes (Signed)
Established Patient Office Visit  Subjective:  Patient ID: Laura [name], female    DOB: 07-19-1971  Age: 49 y.o. MRN: [id number]  CC:  Chief Complaint  Patient presents with  . mood  . Migraine    HPI Laura [name] presents for low up migraine headaches-she is overall doing well with the Topamax.  Still getting about 2-3 headaches per month.  And getting about 1 severe migraine every 2 months.  Most of her headaches do include some light sensitivity.  She does not usually get nauseated with those unless it is one of the more severe headaches.  She did have some old Zofran that she took during her last episode and that was helpful.  She usually uses Tylenol for rescue.  She follows with therapist for her depression.  She is currently on Lexapro 10 mg daily.    She feels like overall her medication is working well though she still struggling with sleep.  She says some nights it can take hours for her to fall asleep.  She does avoid caffeine.  She does try to go to bed around the same time.  She does have animals in the bedroom.  She did end up having surgery on her left foot.  Nonweightbearing for about 8 weeks.  She still has a couple more weeks before she can really put weight on it but she does feel like she is getting better and healing well.  Her pain has pretty much gone but again she is not actually putting pressure on it.  She did let me know that while on vacation she wore her wedding bands and rings and within 3 hours started to break out.  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  Past Medical History:  Diagnosis Date  . Allergic rhinitis   . Dyslipidemia   . Dysmenorrhea   . Gall bladder disease   . Infertility, female   . Obesity   . Polycystic ovary disease   . 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 LAPAROSCOPIC CHOLECYSTECTOMY    . SLEEVE GASTROPLASTY    . SUPRAVENTRICULAR TACHYCARDIA ABLATION N/A  07/23/2011   Procedure: SUPRAVENTRICULAR TACHYCARDIA ABLATION;  Surgeon: Evans Lance, MD;  Location: [facility] CATH LAB;  Service: Cardiovascular;  Laterality: N/A;  . transthoracic echcardiogram  09/15/06    Family History  Problem Relation Age of Onset  . Colon cancer Mother 58  . Breast cancer Mother   . Diabetes Mother   . Heart attack Mother        AMI, in late 21's  . Depression Mother   . Heart disease Mother   . Hypertension Father   . Thyroid disease Brother   . Thyroid disease Sister     Social History   Socioeconomic History  . Marital status: Married    Spouse name: matthew  . Number of children: 5  . Years of education: Not on file  . Highest education level: Not on file  Occupational History  . Occupation: Surveyor, quantity: [employer]  Tobacco Use  . Smoking status: Former Smoker    Quit date: 07/15/2002    Years since quitting: 17.8  . Smokeless tobacco: Never Used  Substance and Sexual Activity  . Alcohol use: No    Alcohol/week: 0.0 standard drinks  . Drug use: No  . Sexual activity: Yes    Partners: Male  Other  Topics Concern  . Not on file  Social History Narrative   Some exercise.  In bariatric program at [facility].    Social Determinants of Health   Financial Resource Strain:   . Difficulty of Paying Living Expenses: Not on file  Food Insecurity:   . Worried About Charity fundraiser in the Last Year: Not on file  . Ran Out of Food in the Last Year: Not on file  Transportation Needs:   . Lack of Transportation (Medical): Not on file  . Lack of Transportation (Non-Medical): Not on file  Physical Activity:   . Days of Exercise per Week: Not on file  . Minutes of Exercise per Session: Not on file  Stress:   . Feeling of Stress : Not on file  Social Connections:   . Frequency of Communication with Friends and Family: Not on file  . Frequency of Social Gatherings with Friends and Family: Not on file  . Attends Religious  Services: Not on file  . Active Member of Clubs or Organizations: Not on file  . Attends Archivist Meetings: Not on file  . Marital Status: Not on file  Intimate Partner Violence:   . Fear of Current or Ex-Partner: Not on file  . Emotionally Abused: Not on file  . Physically Abused: Not on file  . Sexually Abused: Not on file    Outpatient Medications Prior to Visit  Medication Sig Dispense Refill  . BIOTIN 5000 PO Take 1 capsule by mouth.    . Cholecalciferol 50 MCG (2000 UT) TABS Take 1 tablet by mouth daily.    . clobetasol ointment (TEMOVATE) 0.05 % APPLY TO AFFECTED AREA TWICE A DAY 60 g 1  . ergocalciferol (VITAMIN D2) 1.25 MG (50000 UT) capsule Take 1 capsule by mouth 2 (two) times a week.    . escitalopram (LEXAPRO) 10 MG tablet TAKE 1 TABLET BY MOUTH EVERY DAY 90 tablet 2  . gabapentin (NEURONTIN) 300 MG capsule PLEASE SEE ATTACHED FOR DETAILED DIRECTIONS    . ipratropium (ATROVENT) 0.06 % nasal spray INSTILL 2 SPRAYS INTO BOTH NOSTRILS FOUR TIMES A DAY 15 mL 1  . iron polysaccharides (NIFEREX) 150 MG capsule Take 1 capsule (150 mg total) by mouth daily. 90 capsule 3  . omeprazole (PRILOSEC) 40 MG capsule Take 40 mg by mouth daily.    . Prenatal Vit-Fe Fumarate-FA (PRENATAL VITAMIN PO) Take 1 tablet by mouth daily.    Marland Kitchen topiramate (TOPAMAX) 50 MG tablet TAKE 1 TABLET BY MOUTH TWICE A DAY 60 tablet 1   No facility-administered medications prior to visit.    Allergies  Allergen Reactions  . Morphine Itching  . Chromium Other (See Comments)    causes contact dermatitis  . Neomycin   . Penicillins Hives    ROS Review of Systems    Objective:    Physical Exam Constitutional:      Appearance: She is well-developed.  HENT:     Head: Normocephalic and atraumatic.  Cardiovascular:     Rate and Rhythm: Normal rate and regular rhythm.     Heart sounds: Normal heart sounds.  Pulmonary:     Effort: Pulmonary effort is normal.     Breath sounds: Normal  breath sounds.  Skin:    General: Skin is warm and dry.     Comments: Dermatitis around base of her ring fingers.    Neurological:     Mental Status: She is alert and oriented to person, place, and time.  Psychiatric:        Behavior: Behavior normal.     BP (!) 111/52   Pulse 74   Ht 5\' 4"  (1.626 m)   Wt 240 lb (108.9 kg)   SpO2 98%   BMI 41.20 kg/m  Wt Readings from Last 3 Encounters:  05/01/20 240 lb (108.9 kg)  02/21/20 240 lb (108.9 kg)  10/26/19 239 lb (108.4 kg)     Health Maintenance Due  Topic Date Due  . Hepatitis C Screening  Never done  . HIV Screening  Never done  . PAP SMEAR-Modifier  01/22/2020    There are no preventive care reminders to display for this patient.  Lab Results  Component Value Date   TSH 2.676 05/18/2012   Lab Results  Component Value Date   WBC 6.6 12/14/2017   HGB 13.3 12/14/2017   HCT 38.8 12/14/2017   MCV 88.8 12/14/2017   PLT 221 12/14/2017   Lab Results  Component Value Date   NA 142 05/10/2019   K 4.6 05/10/2019   CO2 24 (A) 05/10/2019   GLUCOSE 93 12/14/2017   BUN 9 05/10/2019   CREATININE 0.7 05/10/2019   BILITOT 0.5 12/14/2017   ALKPHOS 71 05/18/2012   AST 22 05/10/2019   ALT 27 05/10/2019   PROT 5.8 (L) 12/14/2017   ALBUMIN 3.5 05/18/2012   CALCIUM 8.9 05/10/2019   GFR 86.35 07/18/2011   Lab Results  Component Value Date   CHOL 178 09/05/2019   Lab Results  Component Value Date   HDL 49 09/05/2019   Lab Results  Component Value Date   LDLCALC 112 09/05/2019   Lab Results  Component Value Date   TRIG 91 09/05/2019   Lab Results  Component Value Date   CHOLHDL 5.4 Ratio 09/06/2009   Lab Results  Component Value Date   HGBA1C 5.0 09/05/2019      Assessment & Plan:   Problem List Items Addressed This Visit      Cardiovascular and Mediastinum   Migraine - Primary    Overall she is actually doing fairly well on the Topamax.  She still getting about 2-3 headaches per month and only  getting about one severe headache every 2 months.  She mostly relies on Tylenol acutely.  So we will try a triptan and see if this is helpful.  Also will give her a small prescription for Zofran to use for the more severe headaches when she actually gets really nauseated.      Relevant Medications   gabapentin (NEURONTIN) 300 MG capsule   topiramate (TOPAMAX) 50 MG tablet   rizatriptan (MAXALT-MLT) 10 MG disintegrating tablet   ondansetron (ZOFRAN ODT) 4 MG disintegrating tablet     Other   Major depressive disorder, recurrent episode ([place])    Overall doing well on Lexapro.  Continue current regimen.  She is working with a Transport planner.      Insomnia    Discussed sleep hygiene.       H/O metal allergy    Recent reaction to wearing rings. H/O of chromium allergy. Using steroid cream on it.         Other Visit Diagnoses    Need for immunization against influenza       Relevant Orders   Flu Vaccine QUAD 36+ mos IM (Completed)      Reminded to schedule her Pap smear and her follow-up mammogram.  Would like to discuss getting the shingles vaccine when she gets back from her travels.  Meds ordered this encounter  Medications  . topiramate (TOPAMAX) 50 MG tablet    Sig: Take 1 tablet (50 mg total) by mouth 2 (two) times daily.    Dispense:  180 tablet    Refill:  1  . rizatriptan (MAXALT-MLT) 10 MG disintegrating tablet    Sig: Take 1 tablet (10 mg total) by mouth as needed for migraine. May repeat in 2 hours if needed    Dispense:  10 tablet    Refill:  3  . ondansetron (ZOFRAN ODT) 4 MG disintegrating tablet    Sig: Take 1 tablet (4 mg total) by mouth every 8 (eight) hours as needed for nausea or vomiting.    Dispense:  10 tablet    Refill:  0    Follow-up: Return in about 6 months (around 10/29/2020) for Migraines and labwork.    Beatrice Lecher, MD

## 2020-05-01 NOTE — Assessment & Plan Note (Signed)
Overall she is actually doing fairly well on the Topamax.  She still getting about 2-3 headaches per month and only getting about one severe headache every 2 months.  She mostly relies on Tylenol acutely.  So we will try a triptan and see if this is helpful.  Also will give her a small prescription for Zofran to use for the more severe headaches when she actually gets really nauseated.

## 2020-05-01 NOTE — Assessment & Plan Note (Signed)
Recent reaction to wearing rings. H/O of chromium allergy. Using steroid cream on it.

## 2020-05-01 NOTE — Assessment & Plan Note (Signed)
Discussed sleep hygiene

## 2020-05-01 NOTE — Assessment & Plan Note (Signed)
Overall doing well on Lexapro.  Continue current regimen.  She is working with a Transport planner.

## 2020-06-25 ENCOUNTER — Other Ambulatory Visit: Payer: Self-pay | Admitting: Family Medicine

## 2020-07-19 LAB — HM PAP SMEAR: HM Pap smear: NEGATIVE

## 2020-08-20 ENCOUNTER — Other Ambulatory Visit: Payer: Self-pay | Admitting: Family Medicine

## 2020-08-20 DIAGNOSIS — J0110 Acute frontal sinusitis, unspecified: Secondary | ICD-10-CM

## 2020-09-04 ENCOUNTER — Other Ambulatory Visit: Payer: Self-pay | Admitting: Family Medicine

## 2020-09-04 DIAGNOSIS — J0110 Acute frontal sinusitis, unspecified: Secondary | ICD-10-CM

## 2020-09-17 ENCOUNTER — Other Ambulatory Visit: Payer: Self-pay | Admitting: Orthopaedic Surgery

## 2020-09-17 DIAGNOSIS — M25572 Pain in left ankle and joints of left foot: Secondary | ICD-10-CM

## 2020-09-26 ENCOUNTER — Ambulatory Visit
Admission: RE | Admit: 2020-09-26 | Discharge: 2020-09-26 | Disposition: A | Discharge status: Home / Self Care | Payer: Managed Care, Other (non HMO) | Source: Ambulatory Visit | Attending: Orthopaedic Surgery | Admitting: Orthopaedic Surgery

## 2020-09-26 ENCOUNTER — Other Ambulatory Visit: Payer: Self-pay

## 2020-09-26 DIAGNOSIS — M25572 Pain in left ankle and joints of left foot: Secondary | ICD-10-CM

## 2020-09-30 ENCOUNTER — Other Ambulatory Visit: Payer: Managed Care, Other (non HMO)

## 2020-10-03 ENCOUNTER — Other Ambulatory Visit: Payer: Self-pay | Admitting: Family Medicine

## 2020-10-29 ENCOUNTER — Other Ambulatory Visit: Payer: Self-pay

## 2020-10-29 ENCOUNTER — Encounter: Payer: Self-pay | Admitting: Family Medicine

## 2020-10-29 ENCOUNTER — Ambulatory Visit (INDEPENDENT_AMBULATORY_CARE_PROVIDER_SITE_OTHER): Payer: Managed Care, Other (non HMO) | Admitting: Family Medicine

## 2020-10-29 VITALS — BP 105/61 | HR 62 | Ht 64.0 in | Wt 221.0 lb

## 2020-10-29 DIAGNOSIS — H6121 Impacted cerumen, right ear: Secondary | ICD-10-CM | POA: Diagnosis not present

## 2020-10-29 DIAGNOSIS — F330 Major depressive disorder, recurrent, mild: Secondary | ICD-10-CM | POA: Diagnosis not present

## 2020-10-29 DIAGNOSIS — F4321 Adjustment disorder with depressed mood: Secondary | ICD-10-CM | POA: Diagnosis not present

## 2020-10-29 DIAGNOSIS — G43809 Other migraine, not intractable, without status migrainosus: Secondary | ICD-10-CM | POA: Diagnosis not present

## 2020-10-29 MED ORDER — ESCITALOPRAM OXALATE 10 MG PO TABS: 10.0000 mg | ORAL_TABLET | Freq: Every day | ORAL | 1 refills | Status: DC

## 2020-10-29 MED ORDER — RIZATRIPTAN BENZOATE 5 MG PO TBDP: 5.0000 mg | ORAL_TABLET | ORAL | 3 refills | Status: DC | PRN

## 2020-10-29 MED ORDER — TOPIRAMATE 100 MG PO TABS: 100.0000 mg | ORAL_TABLET | Freq: Two times a day (BID) | ORAL | 1 refills | Status: DC

## 2020-10-29 NOTE — Progress Notes (Signed)
Established Patient Office Visit  Subjective:  Patient ID: Laura [name], female    DOB: [birth year]  Age: 50 y.o. MRN: [id number]  CC:  Chief Complaint  Patient presents with  . Migraine    HPI Laura [name] presents for 72-month follow-up   Migraine headaches.  Laura [name] is currently on Topamax 50 mg twice a day and uses Maxalt for rescue.  Laura [name] says Laura [name] headaches have actually been worse lately in fact Laura [name] had a headache for almost a week and finally went to Laura [name] chiropractor and was able to get some relief.  Laura [name] says Laura [name] is really sensitive to lights and screens when this happens.  This was about 2 weeks ago.  Laura [name] does feel like Laura [name] has a good sleep pattern no major dietary changes recently Laura [name] is been wearing Laura [name] CPAP very consistently at night.  Laura [name] does take the Maxalt occasionally but says it makes Laura [name] really sleepy so Laura [name] can really only take it at night.  Depression-Laura [name] is currently on Lexapro.  Feels like this is working well and is happy with Laura [name] current regimen.  Laura [name] is also scheduled for hearing test coming up and would like me to check Laura [name] ears today.  Past Medical History:  Diagnosis Date  . Allergic rhinitis   . Dyslipidemia   . Dysmenorrhea   . Gall bladder disease   . Infertility, female   . Obesity   . Polycystic ovary disease   . 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 LAPAROSCOPIC CHOLECYSTECTOMY    . SLEEVE GASTROPLASTY    . SUPRAVENTRICULAR TACHYCARDIA ABLATION N/A 07/23/2011   Procedure: SUPRAVENTRICULAR TACHYCARDIA ABLATION;  Surgeon: Evans Lance, MD;  Location: [facility] CATH LAB;  Service: Cardiovascular;  Laterality: N/A;  . transthoracic echcardiogram  09/15/06    Family History  Problem Relation Age of Onset  . Colon cancer Mother 72  . Breast cancer Mother   . Diabetes Mother   . Heart attack Mother        AMI, in late 101's  . Depression Mother   . Heart disease Mother   . Hypertension Father    . Thyroid disease Brother   . Thyroid disease Sister     Social History   Socioeconomic History  . Marital status: Married    Spouse name: matthew  . Number of children: 5  . Years of education: Not on file  . Highest education level: Not on file  Occupational History  . Occupation: Surveyor, quantity: [employer]  Tobacco Use  . Smoking status: Former Smoker    Quit date: 07/15/2002    Years since quitting: 18.3  . Smokeless tobacco: Never Used  Substance and Sexual Activity  . Alcohol use: No    Alcohol/week: 0.0 standard drinks  . Drug use: No  . Sexual activity: Yes    Partners: Male  Other Topics Concern  . Not on file  Social History Narrative   Some exercise.  In bariatric program at [facility].    Social Determinants of Health   Financial Resource Strain: Not on file  Food Insecurity: Not on file  Transportation Needs: Not on file  Physical Activity: Not on file  Stress: Not on file  Social Connections: Not on file  Intimate Partner Violence: Not on file    Outpatient Medications Prior to Visit  Medication Sig Dispense Refill  . BIOTIN 5000 PO Take  1 capsule by mouth.    . Cholecalciferol 50 MCG (2000 UT) TABS Take 1 tablet by mouth daily.    . clobetasol ointment (TEMOVATE) 0.05 % APPLY TO AFFECTED AREA TWICE A DAY 60 g 1  . ergocalciferol (VITAMIN D2) 1.25 MG (50000 UT) capsule Take 1 capsule by mouth 2 (two) times a week.    Marland Kitchen ipratropium (ATROVENT) 0.06 % nasal spray INSTILL 2 SPRAYS INTO BOTH NOSTRILS FOUR TIMES A DAY 45 mL 1  . iron polysaccharides (NIFEREX) 150 MG capsule Take 1 capsule (150 mg total) by mouth daily. 90 capsule 3  . omeprazole (PRILOSEC) 40 MG capsule Take 40 mg by mouth daily.    . ondansetron (ZOFRAN ODT) 4 MG disintegrating tablet Take 1 tablet (4 mg total) by mouth every 8 (eight) hours as needed for nausea or vomiting. 10 tablet 0  . Prenatal Vit-Fe Fumarate-FA (PRENATAL VITAMIN PO) Take 1 tablet by mouth daily.     Marland Kitchen escitalopram (LEXAPRO) 10 MG tablet TAKE 1 TABLET BY MOUTH EVERY DAY 90 tablet 2  . rizatriptan (MAXALT-MLT) 10 MG disintegrating tablet Take 1 tablet (10 mg total) by mouth as needed for migraine. May repeat in 2 hours if needed 10 tablet 3  . topiramate (TOPAMAX) 50 MG tablet TAKE 1 TABLET BY MOUTH TWICE A DAY 180 tablet 1  . gabapentin (NEURONTIN) 300 MG capsule Take 1 capsule (300 mg total) by mouth 3 (three) times daily. 270 capsule 1   No facility-administered medications prior to visit.    Allergies  Allergen Reactions  . Morphine Itching  . Chromium Other (See Comments)    causes contact dermatitis  . Neomycin   . Penicillins Hives    ROS Review of Systems    Objective:    Physical Exam Constitutional:      Appearance: Laura [name] is well-developed.  HENT:     Head: Normocephalic and atraumatic.     Comments: 50% cerumen impaction on the left and about 90% on the right. Cardiovascular:     Rate and Rhythm: Normal rate and regular rhythm.     Heart sounds: Normal heart sounds.  Pulmonary:     Effort: Pulmonary effort is normal.     Breath sounds: Normal breath sounds.  Skin:    General: Skin is warm and dry.  Neurological:     Mental Status: Laura [name] is alert and oriented to person, place, and time.  Psychiatric:        Behavior: Behavior normal.     BP 105/61   Pulse 62   Ht 5\' 4"  (1.626 m)   Wt 221 lb (100.2 kg)   SpO2 100%   BMI 37.93 kg/m  Wt Readings from Last 3 Encounters:  10/29/20 221 lb (100.2 kg)  05/01/20 240 lb (108.9 kg)  02/21/20 240 lb (108.9 kg)     Health Maintenance Due  Topic Date Due  . Hepatitis C Screening  Never done  . HIV Screening  Never done  . PAP SMEAR-Modifier  01/22/2020  . COVID-19 Vaccine (3 - Booster for Pfizer series) 03/25/2020    There are no preventive care reminders to display for this patient.  Lab Results  Component Value Date   TSH 2.676 05/18/2012   Lab Results  Component Value Date   WBC 6.6 12/14/2017    HGB 13.3 12/14/2017   HCT 38.8 12/14/2017   MCV 88.8 12/14/2017   PLT 221 12/14/2017   Lab Results  Component Value Date   NA 142 05/10/2019  K 4.6 05/10/2019   CO2 24 (A) 05/10/2019   GLUCOSE 93 12/14/2017   BUN 9 05/10/2019   CREATININE 0.7 05/10/2019   BILITOT 0.5 12/14/2017   ALKPHOS 71 05/18/2012   AST 22 05/10/2019   ALT 27 05/10/2019   PROT 5.8 (L) 12/14/2017   ALBUMIN 3.5 05/18/2012   CALCIUM 8.9 05/10/2019   GFR 86.35 07/18/2011   Lab Results  Component Value Date   CHOL 178 09/05/2019   Lab Results  Component Value Date   HDL 49 09/05/2019   Lab Results  Component Value Date   LDLCALC 112 09/05/2019   Lab Results  Component Value Date   TRIG 91 09/05/2019   Lab Results  Component Value Date   CHOLHDL 5.4 Ratio 09/06/2009   Lab Results  Component Value Date   HGBA1C 5.0 09/05/2019      Assessment & Plan:   Problem List Items Addressed This Visit      Cardiovascular and Mediastinum   Migraine    Discussed options.  We will try increasing Laura [name] Topamax.  New prescription sent to pharmacy were also going to decrease the Maxalt to 5 mg to see if Laura [name] is able to take it during the day without excess sedation.  Laura [name] says it is effective when Laura [name] takes it.  We will see if this is a better balance.  Follow-up in 6 months.      Relevant Medications   rizatriptan (MAXALT-MLT) 5 MG disintegrating tablet   topiramate (TOPAMAX) 100 MG tablet   escitalopram (LEXAPRO) 10 MG tablet     Other   Situational depression   Relevant Medications   escitalopram (LEXAPRO) 10 MG tablet   Major depressive disorder, recurrent episode ([place])    Doing well on current regimen.  We will make sure that Laura [name] has refills on file.      Relevant Medications   escitalopram (LEXAPRO) 10 MG tablet    Other Visit Diagnoses    Impacted cerumen of right ear    -  Primary     Cerumen impaction-encouraged Laura [name] to start that over the counter Debrox drops.  We can always schedule  Laura [name] for irrigation if Laura [name] would like.  Meds ordered this encounter  Medications  . rizatriptan (MAXALT-MLT) 5 MG disintegrating tablet    Sig: Take 1 tablet (5 mg total) by mouth as needed for migraine. May repeat in 2 hours if needed    Dispense:  10 tablet    Refill:  3  . topiramate (TOPAMAX) 100 MG tablet    Sig: Take 1 tablet (100 mg total) by mouth 2 (two) times daily.    Dispense:  180 tablet    Refill:  1  . escitalopram (LEXAPRO) 10 MG tablet    Sig: Take 1 tablet (10 mg total) by mouth daily.    Dispense:  90 tablet    Refill:  1    Follow-up: No follow-ups on file.    Beatrice Lecher, MD

## 2020-10-29 NOTE — Assessment & Plan Note (Signed)
Doing well on current regimen.  We will make sure that she has refills on file.

## 2020-10-29 NOTE — Assessment & Plan Note (Signed)
Discussed options.  We will try increasing her Topamax.  New prescription sent to pharmacy were also going to decrease the Maxalt to 5 mg to see if she is able to take it during the day without excess sedation.  She says it is effective when she takes it.  We will see if this is a better balance.  Follow-up in 6 months.

## 2020-11-01 ENCOUNTER — Encounter: Payer: Self-pay | Admitting: Family Medicine

## 2020-11-01 DIAGNOSIS — G43809 Other migraine, not intractable, without status migrainosus: Secondary | ICD-10-CM

## 2020-11-02 MED ORDER — TOPIRAMATE 100 MG PO TABS: 100.0000 mg | ORAL_TABLET | Freq: Two times a day (BID) | ORAL | 1 refills | Status: DC

## 2020-11-08 ENCOUNTER — Telehealth: Payer: Self-pay | Admitting: *Deleted

## 2020-11-08 ENCOUNTER — Other Ambulatory Visit: Payer: Self-pay | Admitting: *Deleted

## 2020-11-08 DIAGNOSIS — M2550 Pain in unspecified joint: Secondary | ICD-10-CM

## 2020-11-08 NOTE — Telephone Encounter (Signed)
Pt lvm asking if she should schedule a f/u appt with Dr. Madilyn Fireman since she was just seen by her recently and had the labs and mri done and Dr. Lucia Gaskins agreed to Dr. Gardiner Ramus recommendation of moving fwd with Rheumatology referral.

## 2020-11-08 NOTE — Telephone Encounter (Signed)
Okay to push out follow-up.  Maybe we can even regroup after she consults with the rheumatologist and has some additional work-up done. Can you see when last pap done wo we can get reports

## 2020-11-08 NOTE — Telephone Encounter (Addendum)
Pt advised she asked that the referral be placed with a rheumatologist in [place].  Also said that she will need labs done.   Sent fax for pap. She said that she has an upcoming appt for her mammogram.

## 2020-11-09 NOTE — Telephone Encounter (Signed)
Labs ordered. She can go at her convenience.  We also need to get Dr. Karyl Kinnier notes ( podiatry? ) to send with the referral. I don't have any of his notes. I do see her MRI.

## 2020-11-09 NOTE — Telephone Encounter (Signed)
I had already sent for records release for her most recent OV from him.

## 2020-11-09 NOTE — Telephone Encounter (Signed)
We will place order for sed rate, CRP, ANA, rheumatoid factor, anti-CCP, uric acid

## 2020-11-28 ENCOUNTER — Encounter: Payer: Self-pay | Admitting: Family Medicine

## 2020-11-28 NOTE — Progress Notes (Signed)
Negative for intraepithelial lesion or malignancy.  

## 2020-12-06 ENCOUNTER — Telehealth: Payer: Self-pay | Admitting: Family Medicine

## 2020-12-06 ENCOUNTER — Encounter: Payer: Self-pay | Admitting: Family Medicine

## 2020-12-06 ENCOUNTER — Other Ambulatory Visit: Payer: Self-pay

## 2020-12-06 ENCOUNTER — Ambulatory Visit (INDEPENDENT_AMBULATORY_CARE_PROVIDER_SITE_OTHER): Payer: Managed Care, Other (non HMO) | Admitting: Family Medicine

## 2020-12-06 VITALS — BP 100/54 | HR 68 | Wt 218.0 lb

## 2020-12-06 DIAGNOSIS — M2550 Pain in unspecified joint: Secondary | ICD-10-CM | POA: Diagnosis not present

## 2020-12-06 DIAGNOSIS — R5383 Other fatigue: Secondary | ICD-10-CM

## 2020-12-06 NOTE — Telephone Encounter (Signed)
Hi Laura [name], patient came in today for office visit and said she had not heard yet from rheumatology.  She also had not gone for her blood work but she did go for that today.  If you could just check on this and give her an update that would be wonderful.

## 2020-12-06 NOTE — Progress Notes (Signed)
Established Patient Office Visit  Subjective:  Patient ID: Laura [name], female    DOB: January 27, 1971  Age: 50 y.o. MRN: [id number]  CC:  Chief Complaint  Patient presents with  . Follow-up    HPI Laura [name] presents for joint pain that started after having her COVID-vaccine 6 months ago..  She started having more diffuse joint pain after having had her COVID-vaccine.  And then after having a recent shingles vaccine in her right shoulder she ended up having significant pain that went down her arm, extending to her middle finger.  She said that that lasted for at least a week before it started improving.  She also had erythema and swelling over the injection shot site where she had the shingles vaccine.  She feels like a lot of her generalized leg pain is creating weakness in her upper and lower extremities she is also felt just really tired and lethargic.  She says she could fall asleep really easily even though most nights she actually sleeps well.  She denies any unusual night sweats or abnormal weight loss.  She said in particular she has had some tenderness along the outer portion of the left shin on her lower leg.  No recent chest pain or breathing problems.  She had the COVID-vaccine about 6 months ago.  He is even been working with a Restaurant manager, fast food and feels like that actually has been helpful.  Unfortunately she still having a lot of pain in her left ankle she feels like the injection has worn off.  But she says she does not really want to get another one quite yet.  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   Past Medical History:  Diagnosis Date  . Allergic rhinitis   . Dyslipidemia   . Dysmenorrhea   . Gall bladder disease   . Infertility, female   . Obesity   . Polycystic ovary disease   . 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 LAPAROSCOPIC CHOLECYSTECTOMY     . SLEEVE GASTROPLASTY    . SUPRAVENTRICULAR TACHYCARDIA ABLATION N/A 07/23/2011   Procedure: SUPRAVENTRICULAR TACHYCARDIA ABLATION;  Surgeon: Evans Lance, MD;  Location: [facility] CATH LAB;  Service: Cardiovascular;  Laterality: N/A;  . transthoracic echcardiogram  09/15/06    Family History  Problem Relation Age of Onset  . Colon cancer Mother 74  . Breast cancer Mother   . Diabetes Mother   . Heart attack Mother        AMI, in late 40's  . Depression Mother   . Heart disease Mother   . Hypertension Father   . Thyroid disease Brother   . Thyroid disease Sister     Social History   Socioeconomic History  . Marital status: Married    Spouse name: Laura [name]  . Number of children: 5  . Years of education: Not on file  . Highest education level: Not on file  Occupational History  . Occupation: Surveyor, quantity: [employer]  Tobacco Use  . Smoking status: Former Smoker    Quit date: 07/15/2002    Years since quitting: 18.4  . Smokeless tobacco: Never Used  Substance and Sexual Activity  . Alcohol use: No    Alcohol/week: 0.0 standard drinks  . Drug use: No  . Sexual activity: Yes    Partners: Male  Other Topics Concern  .  Not on file  Social History Narrative   Some exercise.  In bariatric program at [facility].    Social Determinants of Health   Financial Resource Strain: Not on file  Food Insecurity: Not on file  Transportation Needs: Not on file  Physical Activity: Not on file  Stress: Not on file  Social Connections: Not on file  Intimate Partner Violence: Not on file    Outpatient Medications Prior to Visit  Medication Sig Dispense Refill  . BIOTIN 5000 PO Take 1 capsule by mouth.    . Cholecalciferol 50 MCG (2000 UT) TABS Take 1 tablet by mouth daily.    . ergocalciferol (VITAMIN D2) 1.25 MG (50000 UT) capsule Take 1 capsule by mouth 2 (two) times a week.    . escitalopram (LEXAPRO) 10 MG tablet Take 1 tablet (10 mg total) by mouth daily. 90  tablet 1  . ipratropium (ATROVENT) 0.06 % nasal spray INSTILL 2 SPRAYS INTO BOTH NOSTRILS FOUR TIMES A DAY 45 mL 1  . iron polysaccharides (NIFEREX) 150 MG capsule Take 1 capsule (150 mg total) by mouth daily. 90 capsule 3  . omeprazole (PRILOSEC) 40 MG capsule Take 40 mg by mouth daily.    . ondansetron (ZOFRAN ODT) 4 MG disintegrating tablet Take 1 tablet (4 mg total) by mouth every 8 (eight) hours as needed for nausea or vomiting. 10 tablet 0  . Prenatal Vit-Fe Fumarate-FA (PRENATAL VITAMIN PO) Take 1 tablet by mouth daily.    . rizatriptan (MAXALT-MLT) 5 MG disintegrating tablet Take 1 tablet (5 mg total) by mouth as needed for migraine. May repeat in 2 hours if needed 10 tablet 3  . topiramate (TOPAMAX) 100 MG tablet Take 1 tablet (100 mg total) by mouth 2 (two) times daily. 180 tablet 1  . WEGOVY 2.4 MG/0.75ML SOAJ Inject 2.4 mg into the skin once a week.     No facility-administered medications prior to visit.    Allergies  Allergen Reactions  . Morphine Itching  . Chromium Other (See Comments)    causes contact dermatitis  . Neomycin   . Penicillins Hives    ROS Review of Systems    Objective:    Physical Exam Vitals reviewed.  Constitutional:      Appearance: She is well-developed.  HENT:     Head: Normocephalic and atraumatic.  Eyes:     Conjunctiva/sclera: Conjunctivae normal.  Cardiovascular:     Rate and Rhythm: Normal rate.  Pulmonary:     Effort: Pulmonary effort is normal.  Skin:    General: Skin is dry.     Coloration: Skin is not pale.  Neurological:     Mental Status: She is alert and oriented to person, place, and time.  Psychiatric:        Behavior: Behavior normal.     BP (!) 100/54   Pulse 68   Wt 218 lb (98.9 kg)   BMI 37.42 kg/m  Wt Readings from Last 3 Encounters:  12/06/20 218 lb (98.9 kg)  10/29/20 221 lb (100.2 kg)  05/01/20 240 lb (108.9 kg)     Health Maintenance Due  Topic Date Due  . Hepatitis C Screening  Never done  .  HIV Screening  Never done    There are no preventive care reminders to display for this patient.  Lab Results  Component Value Date   TSH 2.676 05/18/2012   Lab Results  Component Value Date   WBC 6.6 12/14/2017   HGB 13.3 12/14/2017  HCT 38.8 12/14/2017   MCV 88.8 12/14/2017   PLT 221 12/14/2017   Lab Results  Component Value Date   NA 142 05/10/2019   K 4.6 05/10/2019   CO2 24 (A) 05/10/2019   GLUCOSE 93 12/14/2017   BUN 9 05/10/2019   CREATININE 0.7 05/10/2019   BILITOT 0.5 12/14/2017   ALKPHOS 71 05/18/2012   AST 22 05/10/2019   ALT 27 05/10/2019   PROT 5.8 (L) 12/14/2017   ALBUMIN 3.5 05/18/2012   CALCIUM 8.9 05/10/2019   GFR 86.35 07/18/2011   Lab Results  Component Value Date   CHOL 178 09/05/2019   Lab Results  Component Value Date   HDL 49 09/05/2019   Lab Results  Component Value Date   LDLCALC 112 09/05/2019   Lab Results  Component Value Date   TRIG 91 09/05/2019   Lab Results  Component Value Date   CHOLHDL 5.4 Ratio 09/06/2009   Lab Results  Component Value Date   HGBA1C 5.0 09/05/2019      Assessment & Plan:   Problem List Items Addressed This Visit   None   Visit Diagnoses    Arthralgia, unspecified joint    -  Primary   Other fatigue         Arthralgia and fatigue-I did order labs for her to check for some autoimmune type causes encouraged her to go.  We had already placed a referral to rheumatology I think we were waiting on some notes from her podiatrist as they had also found some things that had originally prompted the referral to rheumatology.  We will check on that referral.  I have had several patients have significant joint pain after receiving the COVID-vaccine so this certainly could be a potential cause but I also want a make sure that it has not triggered her on veiled any underlying rheumatologic disorder as well.  Fatigue-we will check for anemia.  Sounds like her sleep is overall okay.  Blood pressure is a little  bit low which could be contributing she is not currently on any blood pressure medications.  She is on an SSRI.  No orders of the defined types were placed in this encounter.  Sed rate, CRP, ANA, rheumatoid factor, anti-CCP, CBC ordered.  Follow-up: Return if symptoms worsen or fail to improve.    Beatrice Lecher, MD

## 2020-12-07 LAB — COMPLETE METABOLIC PANEL WITH GFR
AG Ratio: 1.9 (calc) (ref 1.0–2.5)
ALT: 46 U/L — ABNORMAL HIGH (ref 6–29)
AST: 32 U/L (ref 10–35)
Albumin: 4 g/dL (ref 3.6–5.1)
Alkaline phosphatase (APISO): 105 U/L (ref 37–153)
BUN: 9 mg/dL (ref 7–25)
CO2: 26 mmol/L (ref 20–32)
Calcium: 9 mg/dL (ref 8.6–10.4)
Chloride: 110 mmol/L (ref 98–110)
Creat: 0.77 mg/dL (ref 0.50–1.05)
GFR, Est African American: 104 mL/min/{1.73_m2} (ref >=60)
GFR, Est Non African American: 90 mL/min/{1.73_m2} (ref >=60)
Globulin: 2.1 g/dL (calc) (ref 1.9–3.7)
Glucose, Bld: 85 mg/dL (ref 65–99)
Potassium: 4.5 mmol/L (ref 3.5–5.3)
Sodium: 142 mmol/L (ref 135–146)
Total Bilirubin: 0.4 mg/dL (ref 0.2–1.2)
Total Protein: 6.1 g/dL (ref 6.1–8.1)

## 2020-12-07 LAB — RHEUMATOID FACTOR: Rheumatoid fact SerPl-aCnc: <14 IU/mL (ref <14)

## 2020-12-07 LAB — ANA: Anti Nuclear Antibody (ANA): NEGATIVE

## 2020-12-07 LAB — CBC
HCT: 47.6 % — ABNORMAL HIGH (ref 35.0–45.0)
Hemoglobin: 15.8 g/dL — ABNORMAL HIGH (ref 11.7–15.5)
MCH: 31 pg (ref 27.0–33.0)
MCHC: 33.2 g/dL (ref 32.0–36.0)
MCV: 93.3 fL (ref 80.0–100.0)
MPV: 9.3 fL (ref 7.5–12.5)
Platelets: 225 10*3/uL (ref 140–400)
RBC: 5.1 10*6/uL (ref 3.80–5.10)
RDW: 12.4 % (ref 11.0–15.0)
WBC: 5.7 10*3/uL (ref 3.8–10.8)

## 2020-12-07 LAB — SEDIMENTATION RATE: Sed Rate: 6 mm/h (ref 0–20)

## 2020-12-07 LAB — CYCLIC CITRUL PEPTIDE ANTIBODY, IGG: Cyclic Citrullin Peptide Ab: <16 UNITS

## 2020-12-07 LAB — C-REACTIVE PROTEIN: CRP: 2 mg/L (ref <8.0)

## 2020-12-07 NOTE — Telephone Encounter (Signed)
I have sent a 2nd medical records release to their office for her OV notes.

## 2020-12-07 NOTE — Telephone Encounter (Signed)
Dr. Madilyn Fireman  I was waiting on the office note from Dr. Lucia Gaskins and her blood work which are the two things they want to see I have Dr. Pollie Friar note but I went ahead and sent the referral without the blood work since it has still not been done. They should be contacting her soon. - CF

## 2021-01-18 ENCOUNTER — Ambulatory Visit (INDEPENDENT_AMBULATORY_CARE_PROVIDER_SITE_OTHER): Payer: Managed Care, Other (non HMO) | Admitting: Family Medicine

## 2021-01-18 ENCOUNTER — Other Ambulatory Visit: Payer: Self-pay

## 2021-01-18 ENCOUNTER — Encounter: Payer: Self-pay | Admitting: Family Medicine

## 2021-01-18 VITALS — BP 98/65 | HR 63 | Temp 98.0°F | Resp 16

## 2021-01-18 DIAGNOSIS — L0291 Cutaneous abscess, unspecified: Secondary | ICD-10-CM | POA: Diagnosis not present

## 2021-01-18 MED ORDER — DOXYCYCLINE HYCLATE 100 MG PO TABS: 100.0000 mg | ORAL_TABLET | Freq: Two times a day (BID) | ORAL | 0 refills | Status: DC

## 2021-01-18 NOTE — Progress Notes (Signed)
Acute Office Visit  Subjective:    Patient ID: Laura [name], female    DOB: 12/18/1970, 50 y.o.   MRN: [id number]  Chief Complaint  Patient presents with   Insect Bite    HPI Patient is in today for possible bug bite.  Patient noticed a tender spot on her back Tuesday and initially thought it may have been a mosquito bite. She had daughter look at it on Wednesday and she thought she could see a puncture/bite. She hasn't noticed any improvement over the past 2 days so she wanted to have it checked. Describes the area as swollen and tender 2/10 pressure type feeling, more of a discomfort than true pain. She denies seeing anything bite her and has not been outside much recently. Area is on right upper back/scapula region. She denies any fevers, malaise, chest pain, shortness of breath, fatigue, body aches, GI/GU symptoms.    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    Past Surgical History:  Procedure Laterality Date   LAPAROSCOPIC CHOLECYSTECTOMY     SLEEVE GASTROPLASTY     SUPRAVENTRICULAR TACHYCARDIA ABLATION N/A 07/23/2011   Procedure: SUPRAVENTRICULAR TACHYCARDIA ABLATION;  Surgeon: Evans Lance, MD;  Location: [facility] CATH LAB;  Service: Cardiovascular;  Laterality: N/A;   transthoracic echcardiogram  09/15/06    Family History  Problem Relation Age of Onset   Colon cancer Mother 66   Breast cancer Mother    Diabetes Mother    Heart attack Mother        AMI, in late 56's   Depression Mother    Heart disease Mother    Hypertension Father    Thyroid disease Brother    Thyroid disease Sister     Social History   Socioeconomic History   Marital status: Married    Spouse name: [email or web address]   Number of children: 5   Years of education: Not on file   Highest education level: Not on file   Occupational History   Occupation: Equities trader    Employer: [employer]  Tobacco Use   Smoking status: Former    Pack years: 0.00    Types: Cigarettes    Quit date: 07/15/2002    Years since quitting: 18.5   Smokeless tobacco: Never  Substance and Sexual Activity   Alcohol use: No    Alcohol/week: 0.0 standard drinks   Drug use: No   Sexual activity: Yes    Partners: Male  Other Topics Concern   Not on file  Social History Narrative   Some exercise.  In bariatric program at [facility]    Social Determinants of Health   Financial Resource Strain: Not on file  Food Insecurity: Not on file  Transportation Needs: Not on file  Physical Activity: Not on file  Stress: Not on file  Social Connections: Not on file  Intimate Partner Violence: Not on file    Outpatient Medications Prior to Visit  Medication Sig Dispense Refill   BIOTIN 5000 PO Take 1 capsule by mouth.     Cholecalciferol 50 MCG (2000 UT) TABS Take 1 tablet by mouth daily.     ergocalciferol (VITAMIN D2) 1.25 MG (50000 UT) capsule Take 1 capsule by mouth 2 (two) times a week.  escitalopram (LEXAPRO) 10 MG tablet Take 1 tablet (10 mg total) by mouth daily. 90 tablet 1   ipratropium (ATROVENT) 0.06 % nasal spray INSTILL 2 SPRAYS INTO BOTH NOSTRILS FOUR TIMES A DAY 45 mL 1   iron polysaccharides (NIFEREX) 150 MG capsule Take 1 capsule (150 mg total) by mouth daily. 90 capsule 3   omeprazole (PRILOSEC) 40 MG capsule Take 40 mg by mouth daily.     ondansetron (ZOFRAN ODT) 4 MG disintegrating tablet Take 1 tablet (4 mg total) by mouth every 8 (eight) hours as needed for nausea or vomiting. 10 tablet 0   Prenatal Vit-Fe Fumarate-FA (PRENATAL VITAMIN PO) Take 1 tablet by mouth daily.     rizatriptan (MAXALT-MLT) 5 MG disintegrating tablet Take 1 tablet (5 mg total) by mouth as needed for migraine. May repeat in 2 hours if needed 10 tablet 3   topiramate (TOPAMAX) 100 MG tablet Take 1 tablet (100 mg total) by  mouth 2 (two) times daily. 180 tablet 1   WEGOVY 2.4 MG/0.75ML SOAJ Inject 2.4 mg into the skin once a week.     No facility-administered medications prior to visit.    Allergies  Allergen Reactions   Morphine Itching   Chromium Other (See Comments)    causes contact dermatitis   Neomycin    Penicillins Hives    Review of Systems All review of systems negative except what is listed in the HPI      Objective:    Physical Exam Vitals reviewed.  HENT:     Head: Normocephalic and atraumatic.  Skin:    General: Skin is warm and dry.     Comments: Right upper back: mild edema/induration, erythema, warmth, no fluctuance or drainage (see picture below)  Neurological:     General: No focal deficit present.     Mental Status: She is alert and oriented to person, place, and time. Mental status is at baseline.  Psychiatric:        Mood and Affect: Mood normal.        Behavior: Behavior normal.        Thought Content: Thought content normal.        Judgment: Judgment normal.       BP 98/65   Pulse 63   Temp 98 F (36.7 C)   Resp 16   SpO2 98%  Wt Readings from Last 3 Encounters:  12/06/20 218 lb (98.9 kg)  10/29/20 221 lb (100.2 kg)  05/01/20 240 lb (108.9 kg)    Health Maintenance Due  Topic Date Due   HIV Screening  Never done   Hepatitis C Screening  Never done   Zoster Vaccines- Shingrix (2 of 2) 12/24/2020    There are no preventive care reminders to display for this patient.   Lab Results  Component Value Date   TSH 2.676 05/18/2012   Lab Results  Component Value Date   WBC 5.7 12/06/2020   HGB 15.8 (H) 12/06/2020   HCT 47.6 (H) 12/06/2020   MCV 93.3 12/06/2020   PLT 225 12/06/2020   Lab Results  Component Value Date   NA 142 12/06/2020   K 4.5 12/06/2020   CO2 26 12/06/2020   GLUCOSE 85 12/06/2020   BUN 9 12/06/2020   CREATININE 0.77 12/06/2020   BILITOT 0.4 12/06/2020   ALKPHOS 71 05/18/2012   AST 32 12/06/2020   ALT 46 (H) 12/06/2020    PROT 6.1 12/06/2020   ALBUMIN 3.5 05/18/2012   CALCIUM 9.0 12/06/2020  GFR 86.35 07/18/2011   Lab Results  Component Value Date   CHOL 178 09/05/2019   Lab Results  Component Value Date   HDL 49 09/05/2019   Lab Results  Component Value Date   LDLCALC 112 09/05/2019   Lab Results  Component Value Date   TRIG 91 09/05/2019   Lab Results  Component Value Date   CHOLHDL 5.4 Ratio 09/06/2009   Lab Results  Component Value Date   HGBA1C 5.0 09/05/2019       Assessment & Plan:   1. Abscess Bite vs early abscess formation. No fluctuance today. Will treat with doxy and warm compresses. Monitor site over the weekend. If not improving or becomes more indurated/fluctuant, come have it reevaluated. Patient aware of signs/symptoms requiring further/urgent evaluation.  - doxycycline (VIBRA-TABS) 100 MG tablet; Take 1 tablet (100 mg total) by mouth 2 (two) times daily for 5 days.  Dispense: 10 tablet; Refill: 0  Follow-up if symptoms worsen or fail to improve.   Terrilyn Saver, NP

## 2021-01-21 ENCOUNTER — Encounter: Payer: Self-pay | Admitting: Family Medicine

## 2021-01-21 NOTE — Telephone Encounter (Signed)
Ok to schedule for I & D.

## 2021-01-22 ENCOUNTER — Encounter: Payer: Self-pay | Admitting: Sports Medicine

## 2021-01-22 ENCOUNTER — Ambulatory Visit (INDEPENDENT_AMBULATORY_CARE_PROVIDER_SITE_OTHER): Payer: Managed Care, Other (non HMO) | Admitting: Sports Medicine

## 2021-01-22 ENCOUNTER — Other Ambulatory Visit: Payer: Self-pay

## 2021-01-22 DIAGNOSIS — L723 Sebaceous cyst: Secondary | ICD-10-CM

## 2021-01-22 MED ORDER — HYDROCODONE-ACETAMINOPHEN 10-325 MG PO TABS: 1.0000 | ORAL_TABLET | Freq: Three times a day (TID) | ORAL | 0 refills | Status: DC | PRN

## 2021-01-22 MED ORDER — DOXYCYCLINE HYCLATE 100 MG PO TABS: 100.0000 mg | ORAL_TABLET | Freq: Two times a day (BID) | ORAL | 0 refills | Status: AC

## 2021-01-22 NOTE — Progress Notes (Signed)
    Procedures performed today:    Procedure:  Excision of right posterior shoulder 3.1 cm subcutaneous tumor/cyst Risks, benefits, and alternatives explained and consent obtained. Time out conducted. Surface prepped with alcohol. 5cc lidocaine with epinephine infiltrated in a field block. Adequate anesthesia ensured. Area prepped and draped in a sterile fashion. Excision performed with: Using a #10 blade I made an elliptical incision, then using sharp and blunt dissection I encountered the cyst, it was infected, the cyst capsule was adherent to surrounding subcutaneous tissues. Using pressure we expressed significant amounts of sebum and pus, I then sharply dissected through the subcutaneous tissues removing the capsule in a piecemeal fashion, the wound bed was inspected and revealed only glistening subcutaneous tissue. I then closed the incision primarily with 3 vertical mattress 3-0 Ethilon sutures, I then placed #4 3-0 simple interrupted sutures to close the skin. Hemostasis achieved. Pt stable.  Independent interpretation of notes and tests performed by another provider:   None.  Brief History, Exam, Impression, and Recommendations:    Infected sebaceous cyst of right posterior shoulder Laura [name] is a very pleasant 50 year old female, she saw one of our primary care providers for some redness around her posterior right shoulder, suspected to be an arthropod bite, she was given doxycycline, unfortunately symptoms worsen, swelling worsen, she is here for further evaluation and definitive treatment. She does have a palpable fluctuant mass under the skin, there is a punctate area of drainage. I suspected that this was an infected subcutaneous sebaceous cyst. We performed an explorative procedure, and I did note infected sebaceous cyst, I was able to remove all sebum as well as the wall, I obtained intraoperative cultures, this was closed primarily, we will do doxycycline and hydrocodone,  return to see me 10 days for suture removal and wound check.    ___________________________________________ Gwen Her. Dianah Field, M.D., ABFM., CAQSM. Primary Care and [place] Instructor of [place] of [facility] of Medicine

## 2021-01-22 NOTE — Telephone Encounter (Signed)
Patient scheduled.

## 2021-01-22 NOTE — Assessment & Plan Note (Signed)
Laura [name] is a very pleasant 50 year old female, she saw one of our primary care providers for some redness around her posterior right shoulder, suspected to be an arthropod bite, she was given doxycycline, unfortunately symptoms worsen, swelling worsen, she is here for further evaluation and definitive treatment. She does have a palpable fluctuant mass under the skin, there is a punctate area of drainage. I suspected that this was an infected subcutaneous sebaceous cyst. We performed an explorative procedure, and I did note infected sebaceous cyst, I was able to remove all sebum as well as the wall, I obtained intraoperative cultures, this was closed primarily, we will do doxycycline and hydrocodone, return to see me 10 days for suture removal and wound check.

## 2021-01-22 NOTE — Patient Instructions (Signed)
Incision Care, Adult An incision is a surgical cut that is made through your skin. Most incisions are closed after a surgical procedure. Your incision may be closed with stitches (sutures), staples, skin glue, or adhesive strips. You may need to return to your health care provider to have sutures or staples removed. This may occur several days or several weeks after your surgery. Until then, the incision needs to be cared for properly to prevent infection. Follow instructions from your healthcare provider about how to care for your incision. Supplies needed: Soap, water, and a clean hand towel. Wound cleanser. A clean bandage (dressing), if needed. Cream or ointment, if told by your health care provider. Clean gauze. How to care for your incision Cleaning the incision Ask your health care provider how to clean the incision. This may include: Using mild soap and water, or wound cleanser. Using a clean gauze to pat the incision dry after cleaning it. Dressing changes Wash your hands with soap and water for at least 20 seconds before and after you change the dressing. If soap and water are not available, use hand sanitizer. Change your dressing as told by your health care provider. Leave sutures, staples, skin glue, or adhesive strips in place. These skin closures may need to stay in place for 2 weeks or longer. If adhesive strip edges start to loosen and curl up, you may trim the loose edges. Do not remove adhesive strips completely unless your health care provider tells you to do that. Apply cream or ointment. Do this only as told by your health care provider. Cover the incision with a clean dressing. Ask your health care provider when you can begin leaving the incision uncovered. Checking for infection Check your incision area every day for signs of infection. Check for: More redness, swelling, or pain. More fluid or blood. Warmth. Pus or a bad smell.  Follow these instructions at  home Medicines Take over-the-counter and prescription medicines only as told by your health care provider. If you were prescribed an antibiotic medicine, cream, or ointment, take or apply it as told by your health care provider. Do not stop using the antibiotic even if your condition improves. Eating and drinking Eat a diet that includes protein, vitamin A, vitamin C, and other nutrient-rich foods to help the wound heal. Foods rich in protein include meat, fish, eggs, dairy, beans, and nuts. Foods rich in vitamin A include carrots and dark green, leafy vegetables. Foods rich in vitamin C include citrus fruits, tomatoes, broccoli, and peppers. Drink enough fluid to keep your urine pale yellow. General instructions  Do not take baths, swim, use a hot tub, or do anything that would put the incision underwater until your health care provider approves. Ask your health care provider if you may take showers. You may only be allowed to take sponge baths. Limit movement around your incision to promote healing. Avoid straining, lifting, or exercising for the first 2 weeks after your procedure, or for as long as told by your health care provider. Return to your normal activities as told by your health care provider. Ask your health care provider what activities are safe for you. Do not scratch or pick at the incision. Keep it covered as told by your health care provider. Protect your incision from the sun when you are outside for the first 6 months, or for as long as told by your health care provider. Cover up the scar area or apply sunscreen that has an SPF of  at least 30. Do not use any products that contain nicotine or tobacco, such as cigarettes, e-cigarettes, and chewing tobacco. These can delay incision healing after surgery. If you need help quitting, ask your health care provider. Keep all follow-up visits as told by your health care provider. This is important.  Contact a health care provider  if: You have any of these signs of infection: More redness, swelling, or pain around your incision. More fluid or blood coming from your incision. Warmth coming from your incision. Pus or a bad smell coming from your incision. A fever. You are nauseous or you vomit. You are dizzy. Your sutures, staples, skin glue, or adhesive strips come undone. Get help right away if: You have a red streak on the skin near your incision. Your incision bleeds through the dressing and the bleeding does not stop with gentle pressure. The edges of your incision open up and separate. You have signs of a serious bodily reaction to an infection. These signs may include: Fever, shaking chills, or feeling very cold. Confusion or anxiety. Severe pain. Trouble breathing. Fast heartbeat. Clammy or sweaty skin. A rash. These symptoms may represent a serious problem that is an emergency. Do not wait to see if the symptoms will go away. Get medical help right away. Call your local emergency services (911 in the [place] Do not drive yourself to the hospital. Summary Follow instructions from your health care provider about how to care for your incision. Wash your hands with soap and water for at least 20 seconds before and after you change the dressing. If soap and water are not available, use hand sanitizer. Check your incision area every day for signs of infection. Keep all follow-up visits as told by your health care provider. This is important. This information is not intended to replace advice given to you by your health care provider. Make sure you discuss any questions you have with your healthcare provider. Document Revised: 05/18/2019 Document Reviewed: 05/18/2019 Elsevier Patient Education  [place].

## 2021-01-25 DIAGNOSIS — L723 Sebaceous cyst: Secondary | ICD-10-CM

## 2021-01-25 LAB — WOUND CULTURE
MICRO NUMBER:: 12008240
SPECIMEN QUALITY:: ADEQUATE

## 2021-01-29 NOTE — Telephone Encounter (Signed)
Lets get her in at my next available 15-minute slot to take a look at this in person.

## 2021-01-29 NOTE — Telephone Encounter (Signed)
Patient is scheduled for tomorrow.

## 2021-01-30 ENCOUNTER — Encounter: Payer: Self-pay | Admitting: Sports Medicine

## 2021-01-30 ENCOUNTER — Ambulatory Visit (INDEPENDENT_AMBULATORY_CARE_PROVIDER_SITE_OTHER): Payer: Managed Care, Other (non HMO) | Admitting: Sports Medicine

## 2021-01-30 ENCOUNTER — Other Ambulatory Visit: Payer: Self-pay

## 2021-01-30 DIAGNOSIS — L723 Sebaceous cyst: Secondary | ICD-10-CM

## 2021-01-30 MED ORDER — RIFAMPIN 300 MG PO CAPS: 300.0000 mg | ORAL_CAPSULE | Freq: Two times a day (BID) | ORAL | 0 refills | Status: DC

## 2021-01-30 NOTE — Assessment & Plan Note (Signed)
Laura [name] returns, she is a pleasant 50 year old female, I removed a infected sebaceous cyst approximately 8 days ago, she is having some increasing abnormal appearance of her incision, there does appear to be a bit of skin necrosis but no overt dehiscence, I removed all of her sutures, applied Dermabond to stabilize the tension across the wound, and we will do a course of rifampin which has 99% sensitivity for MRSA. She can start taking this now as she finishes her doxycycline. Return to see me in 2 weeks for a final recheck of the incision.

## 2021-01-30 NOTE — Progress Notes (Signed)
    Procedures performed today:    None.  Independent interpretation of notes and tests performed by another provider:   None.  Brief History, Exam, Impression, and Recommendations:    Infected sebaceous cyst of right posterior shoulder Laura [name] returns, she is a pleasant 50 year old female, I removed a infected sebaceous cyst approximately 8 days ago, she is having some increasing abnormal appearance of her incision, there does appear to be a bit of skin necrosis but no overt dehiscence, I removed all of her sutures, applied Dermabond to stabilize the tension across the wound, and we will do a course of rifampin which has 99% sensitivity for MRSA. She can start taking this now as she finishes her doxycycline. Return to see me in 2 weeks for a final recheck of the incision.    ___________________________________________ Gwen Her. Dianah Field, M.D., ABFM., CAQSM. Primary Care and [place] Instructor of [place] of [facility] of Medicine

## 2021-01-31 MED ORDER — RIFAMPIN 300 MG PO CAPS: 300.0000 mg | ORAL_CAPSULE | Freq: Two times a day (BID) | ORAL | 0 refills | Status: DC

## 2021-02-01 ENCOUNTER — Ambulatory Visit: Payer: Managed Care, Other (non HMO) | Admitting: Sports Medicine

## 2021-02-13 ENCOUNTER — Other Ambulatory Visit: Payer: Self-pay

## 2021-02-13 ENCOUNTER — Encounter: Payer: Self-pay | Admitting: Sports Medicine

## 2021-02-13 ENCOUNTER — Ambulatory Visit (INDEPENDENT_AMBULATORY_CARE_PROVIDER_SITE_OTHER): Payer: Managed Care, Other (non HMO) | Admitting: Sports Medicine

## 2021-02-13 DIAGNOSIS — L723 Sebaceous cyst: Secondary | ICD-10-CM

## 2021-02-13 MED ORDER — HYDROCODONE-ACETAMINOPHEN 10-325 MG PO TABS: 1.0000 | ORAL_TABLET | Freq: Three times a day (TID) | ORAL | 0 refills | Status: DC | PRN

## 2021-02-13 NOTE — Progress Notes (Signed)
    Procedures performed today:    None.  Independent interpretation of notes and tests performed by another provider:   None.  Brief History, Exam, Impression, and Recommendations:    Infected sebaceous cyst of right posterior shoulder Laura [name] returns, she is now approximately 3-1/2 weeks post surgical excision of an infected sebaceous cyst behind the right shoulder. At the last visit she did have some skin necrosis but no overt dehiscence. I remove the stitches at the last visit, she has been doing some wound care at home, we also added rifampin after a course of doxycycline. She has done well, the black necrotic skin is gone, she has mostly granulation tissue, it looks clean, and it will heal by secondary intention on its own. We debrided the wound a bit more today, she will keep it dry and clean, return to see me 1 more time in 2 weeks. Refilling pain medication for now.    ___________________________________________ Gwen Her. Dianah Field, M.D., ABFM., CAQSM. Primary Care and [place] Instructor of [place] of [facility] of Medicine

## 2021-02-13 NOTE — Assessment & Plan Note (Signed)
Laura [name] returns, she is now approximately 3-1/2 weeks post surgical excision of an infected sebaceous cyst behind the right shoulder. At the last visit she did have some skin necrosis but no overt dehiscence. I remove the stitches at the last visit, she has been doing some wound care at home, we also added rifampin after a course of doxycycline. She has done well, the black necrotic skin is gone, she has mostly granulation tissue, it looks clean, and it will heal by secondary intention on its own. We debrided the wound a bit more today, she will keep it dry and clean, return to see me 1 more time in 2 weeks. Refilling pain medication for now.

## 2021-02-20 ENCOUNTER — Ambulatory Visit (INDEPENDENT_AMBULATORY_CARE_PROVIDER_SITE_OTHER): Payer: Managed Care, Other (non HMO) | Admitting: Physician Assistant

## 2021-02-20 ENCOUNTER — Encounter: Payer: Self-pay | Admitting: Physician Assistant

## 2021-02-20 ENCOUNTER — Other Ambulatory Visit: Payer: Self-pay

## 2021-02-20 VITALS — BP 91/56 | HR 55 | Temp 97.5°F | Ht 64.0 in | Wt 212.0 lb

## 2021-02-20 DIAGNOSIS — L299 Pruritus, unspecified: Secondary | ICD-10-CM | POA: Diagnosis not present

## 2021-02-20 DIAGNOSIS — L723 Sebaceous cyst: Secondary | ICD-10-CM | POA: Diagnosis not present

## 2021-02-20 DIAGNOSIS — I959 Hypotension, unspecified: Secondary | ICD-10-CM | POA: Diagnosis not present

## 2021-02-20 MED ORDER — HYDROXYZINE PAMOATE 25 MG PO CAPS: 25.0000 mg | ORAL_CAPSULE | Freq: Three times a day (TID) | ORAL | 0 refills | Status: DC | PRN

## 2021-02-20 NOTE — Progress Notes (Signed)
Subjective:    Patient ID: Laura [name], female    DOB: [birth year], 50 y.o.   MRN: [id number]  HPI Pt is a 50 yo female with infected sebaceous cyst that she has been treating since 01/18/2021. She has had 2 rounds of doxycycline and 1 round of rifampin. It was I and D on 01/22/2021 by Dr. Darene [name]. The wound continues to drain achy some but now itch. It is incredibly itchy and noticed some blisters popping up around wound. She keeps it dry and cleans it nightly. Not put anything on the wound new and new bandage.  .. 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   BACK PAIN, THORACIC REGION 07/25/2009   Sebaceous hyperplasia 10/24/2011   Menopausal symptom 05/18/2012   Leg swelling 05/18/2012   GERD (gastroesophageal reflux disease) 06/19/2014   History of continuous positive airway pressure (CPAP) therapy at home 06/19/2014   History of bariatric surgery 02/06/2015   Post-traumatic osteoarthritis of left knee 02/15/2015   Radiculitis of left cervical region 08/09/2015   Menorrhagia with irregular cycle 08/14/2015   Uterine fibroid 08/15/2015   Low back pain 09/05/2015   Trochanteric bursitis of both hips 10/17/2016   Primary osteoarthritis of left hip 11/14/2016   Tendinitis of right hip flexor 12/14/2017   Pain and swelling of right knee 12/20/2018   Intractable right heel pain 12/20/2018   Stress fracture of navicular bone of left foot 02/15/2019   Migraine 04/28/2019   Situational depression 04/28/2019   Iron deficiency 10/26/2019   Ventral hernia with obstruction and without gangrene 02/21/2020   H/O metal allergy 05/01/2020   Insomnia 05/01/2020   Infected sebaceous cyst of right posterior shoulder 01/22/2021   Itching 02/20/2021   Resolved Ambulatory Problems    Diagnosis Date Noted   NEVUS,  ATYPICAL 03/06/2010   Acute pharyngitis 07/25/2009   Acute bronchitis 02/19/2010   ALLERGIC RHINITIS CAUSE UNSPECIFIED 06/03/2010   PNEUMONIA, ATYPICAL 04/23/2010   BRONCHITIS, ALLERGIC 06/03/2010   Cellulitis and abscess of unspecified site 02/19/2010   SKIN TAG 11/29/2008   Trochanteric bursitis of both hips 02/15/2015   Otitis, externa, infective 04/26/2015   Thoracic back pain 08/09/2015   Fall 02/28/2019   Pre-op evaluation 02/21/2020   Past Medical History:  Diagnosis Date   Allergic rhinitis    Dyslipidemia    Dysmenorrhea    Gall bladder disease    Infertility, female    Obesity    Polycystic ovary disease      Review of Systems See HPI.     Objective:   Physical Exam Constitutional:      Appearance: Normal appearance. She is not ill-appearing.  Skin:    Comments: Right posterior shoulder healing wound 1.5cm by 1cm today with some white to green scant discharge but visualize good granulation tissue. No necrosis but some scab formation. Debrided wound today to see if would help with healing. No warmth around wound or induration. A few scattered vesicles surrounding wound.   Neurological:     Mental Status: She is oriented to person, place, and time.  Psychiatric:        Mood and Affect: Mood normal.          Assessment & Plan:  Marland KitchenMarland KitchenAbbe was seen today for wound check.  Diagnoses and all orders for this visit:  Infected sebaceous cyst of  right posterior shoulder -     Wound culture  Hypotension, unspecified hypotension type  Itching -     hydrOXYzine (VISTARIL) 25 MG capsule; Take 1 capsule (25 mg total) by mouth 3 (three) times daily as needed.  Repeat wound culture. Finished 3 rounds of abx.  Appears to be healing and no overt signs of cellulitis.  Small blisters around wound but seem to be improving from yesterday as well.  Debridement of wound and cleaned with hibiclens.  Placed in non stick bandage to keep dry.  Cool compress and vistaril for  itching.  Follow up 1 week with Dr. Darene [name].   BP a little low today. Eat some salt and stay hydrated.

## 2021-02-20 NOTE — Telephone Encounter (Signed)
Patient scheduled.

## 2021-02-23 ENCOUNTER — Other Ambulatory Visit: Payer: Self-pay | Admitting: Physician Assistant

## 2021-02-23 LAB — WOUND CULTURE
MICRO NUMBER:: 12117396
SPECIMEN QUALITY:: ADEQUATE

## 2021-02-23 MED ORDER — SULFAMETHOXAZOLE-TRIMETHOPRIM 800-160 MG PO TABS: 1.0000 | ORAL_TABLET | Freq: Two times a day (BID) | ORAL | 0 refills | Status: DC

## 2021-02-23 NOTE — Progress Notes (Signed)
Called patient. Wound grew E.coli. sent bactrim to pharmacy.

## 2021-02-27 ENCOUNTER — Encounter: Payer: Self-pay | Admitting: Sports Medicine

## 2021-02-27 ENCOUNTER — Ambulatory Visit (INDEPENDENT_AMBULATORY_CARE_PROVIDER_SITE_OTHER): Payer: Managed Care, Other (non HMO) | Admitting: Sports Medicine

## 2021-02-27 ENCOUNTER — Other Ambulatory Visit: Payer: Self-pay

## 2021-02-27 DIAGNOSIS — L723 Sebaceous cyst: Secondary | ICD-10-CM

## 2021-02-27 NOTE — Progress Notes (Signed)
    Procedures performed today:    None.  Independent interpretation of notes and tests performed by another provider:   None.  Brief History, Exam, Impression, and Recommendations:    Infected sebaceous cyst of right posterior shoulder Laura [name] returns, approximately 7 weeks ago I did a surgical excision of an infected sebaceous cyst, we did primary closure, ultimately got infected, she had some skin necrosis but without overt dehiscence after suture removal. We did doxycycline, followed by rifampin, at her last visit with me it was looking okay, she did see Iran Planas, PA-C and had another culture done that grew out E. coli, Bactrim was prescribed, she finished this, today it looks really good, I do not think she needs any dressings other than a Band-Aid maybe for the next couple of days, but it is overall healed. Return as needed    ___________________________________________ Laura Her. Laura [name], M.D., ABFM., CAQSM. Primary Care and [place] Instructor of [place] of [facility] of Medicine

## 2021-02-27 NOTE — Assessment & Plan Note (Signed)
Laura [name] returns, approximately 7 weeks ago I did a surgical excision of an infected sebaceous cyst, we did primary closure, ultimately got infected, she had some skin necrosis but without overt dehiscence after suture removal. We did doxycycline, followed by rifampin, at her last visit with me it was looking okay, she did see Iran Planas, PA-C and had another culture done that grew out E. coli, Bactrim was prescribed, she finished this, today it looks really good, I do not think she needs any dressings other than a Band-Aid maybe for the next couple of days, but it is overall healed. Return as needed

## 2021-03-11 ENCOUNTER — Encounter: Payer: Self-pay | Admitting: Physician Assistant

## 2021-03-11 ENCOUNTER — Telehealth: Payer: Managed Care, Other (non HMO) | Admitting: Physician Assistant

## 2021-03-11 DIAGNOSIS — U071 COVID-19: Secondary | ICD-10-CM | POA: Diagnosis not present

## 2021-03-11 MED ORDER — FLUTICASONE PROPIONATE 50 MCG/ACT NA SUSP: 2.0000 | Freq: Every day | NASAL | 6 refills | Status: AC

## 2021-03-11 MED ORDER — BENZONATATE 100 MG PO CAPS: 100.0000 mg | ORAL_CAPSULE | Freq: Three times a day (TID) | ORAL | 0 refills | Status: DC | PRN

## 2021-03-11 MED ORDER — ALBUTEROL SULFATE HFA 108 (90 BASE) MCG/ACT IN AERS: 1.0000 | INHALATION_SPRAY | Freq: Four times a day (QID) | RESPIRATORY_TRACT | 0 refills | Status: DC | PRN

## 2021-03-11 MED ORDER — MOLNUPIRAVIR EUA 200MG CAPSULE: 4.0000 | ORAL_CAPSULE | Freq: Two times a day (BID) | ORAL | 0 refills | Status: AC

## 2021-03-11 NOTE — Patient Instructions (Signed)
Hello [place],  You are being placed in the home monitoring program for COVID-19 (commonly known as Coronavirus).  This is because you are suspected to have the virus or are known to have the virus.  If you are unsure which group you fall into call your clinic.    As part of this program, you'll answer a daily questionnaire in the MyChart mobile app. You'll receive a notification through the MyChart app when the questionnaire is available. When you log in to MyChart, you'll see the tasks in your To Do activity.       Clinicians will see any answers that are concerning and take appropriate steps.  If at any point you are having a medical emergency, call 911.  If otherwise concerned call your clinic instead of coming into the clinic or hospital.  To keep from spreading the disease you should: Stay home and limit contact with other people as much as possible.  Wash your hands frequently. Cover your coughs and sneezes with a tissue, and throw used tissues in the trash.   Clean and disinfect frequently touched surfaces and objects.    Take care of yourself by: Staying home Resting Drinking fluids Take fever-reducing medications (Tylenol/Acetaminophen and Ibuprofen)  For more information on the disease go to the Centers for Disease Control and Prevention website     COVID-19: What to Do if You Are Sick CDC has updated isolation and quarantine recommendations for the public, and is revising the CDC website to reflect these changes. These recommendations do not apply to healthcare personnel and do not supersede state, local, tribal, or territorial laws, rules, andregulations. If you have a fever, cough or other symptoms, you might have COVID-19. Most people have mild illness and are able to recover at home. If you are sick: Keep track of your symptoms. If you have an emergency warning sign (including trouble breathing), call 911. Steps to help prevent the spread of COVID-19 if you are sick If  you are sick with COVID-19 or think you might have COVID-19, follow the steps below to care for yourself and to help protect other peoplein your home and community. Stay home except to get medical care Stay home. Most people with COVID-19 have mild illness and can recover at home without medical care. Do not leave your home, except to get medical care. Do not visit public areas. Take care of yourself. Get rest and stay hydrated. Take over-the-counter medicines, such as acetaminophen, to help you feel better. Stay in touch with your doctor. Call before you get medical care. Be sure to get care if you have trouble breathing, or have any other emergency warning signs, or if you think it is an emergency. Avoid public transportation, ride-sharing, or taxis. Separate yourself from other people As much as possible, stay in a specific room and away from other people and pets in your home. If possible, you should use a separate bathroom. If you need to be around other people or animals in oroutside of the home, wear a mask. Tell your close contactsthat they may have been exposed to COVID-19. An infected person can spread COVID-19 starting 48 hours (or 2 days) before the person has any symptoms or tests positive. By letting your close contacts know they may have been exposed to COVID-19, you are helping to protect everyone. Additional guidance is available for those living in close quarters and shared housing. See COVID-19 and Animals if you have questions about pets. If you are diagnosed with  COVID-19, someone from the health department may call you. Answer the call to slow the spread. Monitor your symptoms Symptoms of COVID-19 include fever, cough, or other symptoms. Follow care instructions from your healthcare provider and local health department. Your local health authorities may give instructions on checking your symptoms and reporting information. When to seek emergency medical attention Look for  emergency warning signs* for COVID-19. If someone is showing any of these signs, seek emergency medical care immediately: Trouble breathing Persistent pain or pressure in the chest New confusion Inability to wake or stay awake Pale, gray, or blue-colored skin, lips, or nail beds, depending on skin tone *This list is not all possible symptoms. Please call your medical provider forany other symptoms that are severe or concerning to you. Call 911 or call ahead to your local emergency facility: Notify the operator that you are seeking care for someone who has or may haveCOVID-19. Call ahead before visiting your doctor Call ahead. Many medical visits for routine care are being postponed or done by phone or telemedicine. If you have a medical appointment that cannot be postponed, call your doctor's office, and tell them you have or may have COVID-19. This will help the office protect themselves and other patients. Get tested If you have symptoms of COVID-19, get tested. While waiting for test results, you stay away from others, including staying apart from those living in your household. Self-tests are one of several options for testing for the virus that causes COVID-19 and may be more convenient than laboratory-based tests and point-of-care tests. Ask your healthcare provider or your local health department if you need help interpreting your test results. You can visit your state, tribal, local, and territorial health department's website to look for the latest local information on testing sites. If you are sick, wear a mask over your nose and mouth You should wear a mask over your nose and mouth if you must be around other people or animals, including pets (even at home). You don't need to wear the mask if you are alone. If you can't put on a mask (because of trouble breathing, for example), cover your coughs and sneezes in some other way. Try to stay at least 6 feet away from other people. This will  help protect the people around you. Masks should not be placed on young children under age 24 years, anyone who has trouble breathing, or anyone who is not able to remove the mask without help. Note: During the COVID-19 pandemic, medical grade facemasks are reserved forhealthcare workers and some first responders. Cover your coughs and sneezes Cover your mouth and nose with a tissue when you cough or sneeze. Throw away used tissues in a lined trash can. Immediately wash your hands with soap and water for at least 20 seconds. If soap and water are not available, clean your hands with an alcohol-based hand sanitizer that contains at least 60% alcohol. Clean your hands often Wash your hands often with soap and water for at least 20 seconds. This is especially important after blowing your nose, coughing, or sneezing; going to the bathroom; and before eating or preparing food. Use hand sanitizer if soap and water are not available. Use an alcohol-based hand sanitizer with at least 60% alcohol, covering all surfaces of your hands and rubbing them together until they feel dry. Soap and water are the best option, especially if hands are visibly dirty. Avoid touching your eyes, nose, and mouth with unwashed hands. Handwashing Tips Avoid sharing  personal household items Do not share dishes, drinking glasses, cups, eating utensils, towels, or bedding with other people in your home. Wash these items thoroughly after using them with soap and water or put in the dishwasher. Clean all "high-touch" surfaces every day Clean and disinfect high-touch surfaces in your "sick room" and bathroom; wear disposable gloves. Let someone else clean and disinfect surfaces in common areas, but you should clean your bedroom and bathroom, if possible. If a caregiver or other person needs to clean and disinfect a sick person's bedroom or bathroom, they should do so on an as-needed basis. The caregiver/other person should wear a mask  and disposable gloves prior to cleaning. They should wait as long as possible after the person who is sick has used the bathroom before coming in to clean and use the bathroom. High-touch surfaces include phones, remote controls, counters, tabletops, doorknobs, bathroom fixtures, toilets, keyboards, tablets, and bedside tables. Clean and disinfect areas that may have blood, stool, or body fluids on them. Use household cleaners and disinfectants. Clean the area or item with soap and water or another detergent if it is dirty. Then, use a household disinfectant. Be sure to follow the instructions on the label to ensure safe and effective use of the product. Many products recommend keeping the surface wet for several minutes to ensure germs are killed. Many also recommend precautions such as wearing gloves and making sure you have good ventilation during use of the product. Use a product from [employer] List N: Disinfectants for Coronavirus ([id number]). Complete Disinfection Guidance When you can be around others after being sick with COVID-19 Deciding when you can be around others is different for different situations. Find out when you can safely end home isolation. For any additional questions about your care,contact your healthcare provider or state or local health department. 07/18/2020 Content source: [facility] ([facility]), Division of Viral Diseases This information is not intended to replace advice given to you by your health care provider. Make sure you discuss any questions you have with your healthcare provider. Document Revised: 09/14/2020 Document Reviewed: 09/14/2020 Elsevier Patient Education  2022 [place] are being prescribed MOLNUPIRAVIR for COVID-19 infection.   Please call the pharmacy or go through the drive through vs going inside if you are picking up the mediation yourself to prevent further spread. If prescribed to a [facility] affiliated pharmacy, a pharmacist will bring the medication out to your car.   ADMINISTRATION INSTRUCTIONS: Take with or without food. Swallow the tablets whole. Don't chew, crush, or break the medications because it might not work as well  For each dose of the medication, you should be taking FOUR tablets at one time, TWICE a day   Finish your full five-day course of Molnupiravir even if you feel better before you're done. Stopping this medication too early can make it less effective to prevent severe illness related to [place].    Molnupiravir is prescribed for YOU ONLY. Don't share it with others, even if they have similar symptoms as you. This medication might not be right for everyone.   Make sure to take steps to protect yourself and others while you're taking this medication in order to get well soon and to prevent others from getting sick with COVID-19.   **If you are of childbearing potential (any gender) - it is advised to not get pregnant while taking this medication and recommended that condoms are used for female partners the  next 3 months after taking the medication out of extreme caution    COMMON SIDE EFFECTS: Diarrhea Nausea  Dizziness    If your COVID-19 symptoms get worse, get medical help right away. Call 911 if you experience symptoms such as worsening cough, trouble breathing, chest pain that doesn't go away, confusion, a hard time staying awake, and pale or blue-colored skin. This medication won't prevent all COVID-19 cases from getting worse.  Can take to lessen severity: Vit C '500mg'$  twice daily Quercertin 250-'500mg'$  twice daily Zinc 75-'100mg'$  daily Melatonin 3-6 mg at bedtime Vit D3 1000-2000 IU daily Aspirin 81 mg daily with food Optional: Famotidine '20mg'$  daily Also can add tylenol/ibuprofen as needed for fevers and body aches May add Mucinex or Mucinex DM as needed for cough/congestion  10 Things You Can Do to Manage Your COVID-19 Symptoms at  Home If you have possible or confirmed COVID-19 Stay home except to get medical care. Monitor your symptoms carefully. If your symptoms get worse, call your healthcare provider immediately. Get rest and stay hydrated. If you have a medical appointment, call the healthcare provider ahead of time and tell them that you have or may have COVID-19. For medical emergencies, call 911 and notify the dispatch personnel that you have or may have COVID-19. Cover your cough and sneezes with a tissue or use the inside of your elbow. Wash your hands often with soap and water for at least 20 seconds or clean your hands with an alcohol-based hand sanitizer that contains at least 60% alcohol. As much as possible, stay in a specific room and away from other people in your home. Also, you should use a separate bathroom, if available. If you need to be around other people in or outside of the home, wear a mask. Avoid sharing personal items with other people in your household, like dishes, towels, and bedding. Clean all surfaces that are touched often, like counters, tabletops, and doorknobs. Use household cleaning sprays or wipes according to the label instructions. June 02/24/2020 This information is not intended to replace advice given to you by your health care provider. Make sure you discuss any questions you have with your healthcare provider. Document Revised: 09/14/2020 Document Reviewed: 09/14/2020 Elsevier Patient Education  [place].

## 2021-03-11 NOTE — Progress Notes (Signed)
Virtual Visit Consent   Laura [name], you are scheduled for a virtual visit with a [facility] provider today.     Just as with appointments in the office, your consent must be obtained to participate.  Your consent will be active for this visit and any virtual visit you may have with one of our providers in the next 365 days.     If you have a MyChart account, a copy of this consent can be sent to you electronically.  All virtual visits are billed to your insurance company just like a traditional visit in the office.    As this is a virtual visit, video technology does not allow for your provider to perform a traditional examination.  This may limit your provider's ability to fully assess your condition.  If your provider identifies any concerns that need to be evaluated in person or the need to arrange testing (such as labs, EKG, etc.), we will make arrangements to do so.     Although advances in technology are sophisticated, we cannot ensure that it will always work on either your end or our end.  If the connection with a video visit is poor, the visit may have to be switched to a telephone visit.  With either a video or telephone visit, we are not always able to ensure that we have a secure connection.     I need to obtain your verbal consent now.   Are you willing to proceed with your visit today?    Laura [name] has provided verbal consent on 03/11/2021 for a virtual visit (video or telephone).   Mar Daring, PA-C   Date: 03/11/2021 4:28 PM   Virtual Visit via Video Note   I, Mar Daring, connected with  Laura [name]  ([id number], 03-15-1971) on 03/11/21 at  4:30 PM EDT by a video-enabled telemedicine application and verified that I am speaking with the correct person using two identifiers.  Location: Patient: Virtual Visit Location Patient: Other: parked safely in car Provider: Virtual Visit Location Provider: Home Office   I discussed the limitations of  evaluation and management by telemedicine and the availability of in person appointments. The patient expressed understanding and agreed to proceed.    History of Present Illness: Laura [name] is a 50 y.o. who identifies as a female who was assigned female at birth, and is being seen today for covid 40.  HPI: URI  This is a new problem. The current episode started today (symptoms started yesterday, tested positive for covid 19 today). The problem has been gradually worsening. Associated symptoms include abdominal pain (cramping), congestion, coughing (mostly dry), ear pain (right), headaches, rhinorrhea, sinus pain, a sore throat and wheezing. Associated symptoms comments: Myalgias, chills, mild SOB. She has tried acetaminophen for the symptoms. The treatment provided mild relief.     Problems:  Patient Active Problem List   Diagnosis Date Noted   Itching 02/20/2021   Infected sebaceous cyst of right posterior shoulder 01/22/2021   H/O metal allergy 05/01/2020   Insomnia 05/01/2020   Ventral hernia with obstruction and without gangrene 02/21/2020   Iron deficiency 10/26/2019   Migraine 04/28/2019   Situational depression 04/28/2019   Stress fracture of navicular bone of left foot 02/15/2019   Pain and swelling of right knee 12/20/2018   Intractable right heel pain 12/20/2018   Tendinitis of right hip flexor 12/14/2017   Primary osteoarthritis of left hip 11/14/2016   Trochanteric bursitis of both hips 10/17/2016  Low back pain 09/05/2015   Uterine fibroid 08/15/2015   Menorrhagia with irregular cycle 08/14/2015   Radiculitis of left cervical region 08/09/2015   Post-traumatic osteoarthritis of left knee 02/15/2015   History of bariatric surgery 02/06/2015   GERD (gastroesophageal reflux disease) 06/19/2014   History of continuous positive airway pressure (CPAP) therapy at home 06/19/2014   Menopausal symptom 05/18/2012   Leg swelling 05/18/2012   Sebaceous hyperplasia  10/24/2011   BACK PAIN, THORACIC REGION 07/25/2009   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/19/2006   OBESITY, NOS 05/19/2006   Major depressive disorder, recurrent episode ([place]) 05/19/2006   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    Allergies:  Allergies  Allergen Reactions   Morphine Itching   Chromium Other (See Comments)    causes contact dermatitis   Neomycin    Penicillins Hives   Medications:  Current Outpatient Medications:    BIOTIN 5000 PO, Take 1 capsule by mouth., Disp: , Rfl:    Cholecalciferol 50 MCG (2000 UT) TABS, Take 1 tablet by mouth daily., Disp: , Rfl:    ergocalciferol (VITAMIN D2) 1.25 MG (50000 UT) capsule, Take 1 capsule by mouth 2 (two) times a week., Disp: , Rfl:    escitalopram (LEXAPRO) 10 MG tablet, Take 1 tablet (10 mg total) by mouth daily., Disp: 90 tablet, Rfl: 1   HYDROcodone-acetaminophen (NORCO) 10-325 MG tablet, Take 1 tablet by mouth every 8 (eight) hours as needed., Disp: 15 tablet, Rfl: 0   hydrOXYzine (VISTARIL) 25 MG capsule, Take 1 capsule (25 mg total) by mouth 3 (three) times daily as needed., Disp: 30 capsule, Rfl: 0   ipratropium (ATROVENT) 0.06 % nasal spray, INSTILL 2 SPRAYS INTO BOTH NOSTRILS FOUR TIMES A DAY, Disp: 45 mL, Rfl: 1   iron polysaccharides (NIFEREX) 150 MG capsule, Take 1 capsule (150 mg total) by mouth daily., Disp: 90 capsule, Rfl: 3   omeprazole (PRILOSEC) 40 MG capsule, Take 40 mg by mouth daily., Disp: , Rfl:    ondansetron (ZOFRAN ODT) 4 MG disintegrating tablet, Take 1 tablet (4 mg total) by mouth every 8 (eight) hours as needed for nausea or vomiting., Disp: 10 tablet, Rfl: 0   Prenatal Vit-Fe Fumarate-FA (PRENATAL VITAMIN PO), Take 1 tablet by mouth daily., Disp: , Rfl:    rizatriptan (MAXALT-MLT) 5 MG disintegrating tablet, Take 1 tablet (5 mg total) by mouth as needed for migraine. May repeat in 2 hours if needed, Disp: 10 tablet, Rfl: 3   sulfamethoxazole-trimethoprim (BACTRIM DS) 800-160 MG tablet, Take 1 tablet  by mouth 2 (two) times daily. For 10 days., Disp: 20 tablet, Rfl: 0   topiramate (TOPAMAX) 100 MG tablet, Take 1 tablet (100 mg total) by mouth 2 (two) times daily., Disp: 180 tablet, Rfl: 1   WEGOVY 2.4 MG/0.75ML SOAJ, Inject 2.4 mg into the skin once a week., Disp: , Rfl:   Observations/Objective: Patient is well-developed, well-nourished in no acute distress.  Sitting in parked car, comfortably Head is normocephalic, atraumatic.  No labored breathing. Dry, frequent cough heard without effecting sppech Speech is clear and coherent with logical content.  Patient is alert and oriented at baseline.    Assessment and Plan: There are no diagnoses linked to this encounter. - Continue OTC symptomatic management of choice - Will send OTC vitamins and supplement information through AVS - Molnupiravir prescribed - Tessalon, fluticasone, and albuterol for symptomatic relief - Patient enrolled in MyChart symptom monitoring - Push fluids - Rest as needed - Discussed return precautions and when to seek in-person  evaluation, sent via AVS as well  Follow Up Instructions: I discussed the assessment and treatment plan with the patient. The patient was provided an opportunity to ask questions and all were answered. The patient agreed with the plan and demonstrated an understanding of the instructions.  A copy of instructions were sent to the patient via MyChart.  The patient was advised to call back or seek an in-person evaluation if the symptoms worsen or if the condition fails to improve as anticipated.  Time:  I spent 15 minutes with the patient via telehealth technology discussing the above problems/concerns.    Mar Daring, PA-C

## 2021-03-27 ENCOUNTER — Telehealth: Payer: Self-pay | Admitting: General Practice

## 2021-03-27 NOTE — Telephone Encounter (Signed)
Transition Care Management Follow-up Telephone Call Date of discharge and from where: 03/26/21 from [facility] How have you been since you were released from the hospital? Doing ok. Any questions or concerns? No  Items Reviewed: Did the pt receive and understand the discharge instructions provided? Yes  Medications obtained and verified? Yes  Other? No  Any new allergies since your discharge? No  Dietary orders reviewed? Yes Do you have support at home? Yes   Home Care and Equipment/Supplies: Were home health services ordered? no   Functional Questionnaire: (I = Independent and D = Dependent) ADLs: I  Bathing/Dressing- I  Meal Prep- I  Eating- I  Maintaining continence- I  Transferring/Ambulation- I  Managing Meds- I  Follow up appointments reviewed:  PCP Hospital f/u appt confirmed? No  Patient stated that she is following up with urology at this time. She still hasn't passed the kidney stone. [facility] f/u appt confirmed? Yes  Scheduled to see Dr. Janell Quiet on 03/26/21. Are transportation arrangements needed? No  If their condition worsens, is the pt aware to call PCP or go to the Emergency Dept.? Yes Was the patient provided with contact information for the PCP's office or ED? Yes Was to pt encouraged to call back with questions or concerns? Yes

## 2021-04-03 ENCOUNTER — Other Ambulatory Visit: Payer: Self-pay | Admitting: Physician Assistant

## 2021-04-03 DIAGNOSIS — U071 COVID-19: Secondary | ICD-10-CM

## 2021-04-16 ENCOUNTER — Ambulatory Visit (INDEPENDENT_AMBULATORY_CARE_PROVIDER_SITE_OTHER): Payer: Managed Care, Other (non HMO) | Admitting: Family Medicine

## 2021-04-16 ENCOUNTER — Other Ambulatory Visit: Payer: Self-pay

## 2021-04-16 VITALS — BP 100/51 | HR 53 | Ht 64.0 in | Wt 202.4 lb

## 2021-04-16 DIAGNOSIS — K439 Ventral hernia without obstruction or gangrene: Secondary | ICD-10-CM | POA: Diagnosis not present

## 2021-04-16 DIAGNOSIS — K2100 Gastro-esophageal reflux disease with esophagitis, without bleeding: Secondary | ICD-10-CM

## 2021-04-16 DIAGNOSIS — F330 Major depressive disorder, recurrent, mild: Secondary | ICD-10-CM | POA: Diagnosis not present

## 2021-04-16 DIAGNOSIS — E785 Hyperlipidemia, unspecified: Secondary | ICD-10-CM

## 2021-04-16 DIAGNOSIS — K436 Other and unspecified ventral hernia with obstruction, without gangrene: Secondary | ICD-10-CM

## 2021-04-16 DIAGNOSIS — Z23 Encounter for immunization: Secondary | ICD-10-CM | POA: Diagnosis not present

## 2021-04-16 DIAGNOSIS — G43809 Other migraine, not intractable, without status migrainosus: Secondary | ICD-10-CM

## 2021-04-16 NOTE — Progress Notes (Signed)
Established Patient Office Visit  Subjective:  Patient ID: Laura [name], female    DOB: 02-12-1971  Age: 50 y.o. MRN: [id number]  CC: No chief complaint on file.   HPI [name] presents for follow-up migraine headaches.  She is usually able to go lay down when she gets a headache but has used the Maxalt a couple times and it does seem effective.  Follow-up major depressive disorder-she is doing well with her Lexapro she is happy with her current dosing regimen.  She also has a ventral hernia and says its been starting to get sore when she works out and exercises so would like to consider having it repaired.  Did have a kidney stone since I last saw her about 3 weeks ago while she was try to take her daughter to college.  She passed one of the stones but they are planning on doing lithotripsy to get the 7 mm stone that still present in October.  She says they are mostly calcium oxalate crystals.  She also feels like her reflux has actually been better.  In fact she has been able to stop the H2 blocker.   She still follows with [facility] for weight loss and is currently on Wegovy 2.4 mg.  He also had a sebaceous cyst removed after her right posterior back/shoulder area.  She says occasionally she will get a sharp prickly sensation.  But she did want me to look at the wound today  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    Past Surgical History:  Procedure Laterality Date   LAPAROSCOPIC CHOLECYSTECTOMY     SLEEVE GASTROPLASTY     SUPRAVENTRICULAR TACHYCARDIA ABLATION N/A 07/23/2011   Procedure: SUPRAVENTRICULAR TACHYCARDIA ABLATION;  Surgeon: Evans Lance, MD;  Location: [facility] CATH LAB;  Service: Cardiovascular;  Laterality: N/A;   transthoracic echcardiogram  09/15/06    Family History   Problem Relation Age of Onset   Colon cancer Mother 11   Breast cancer Mother    Diabetes Mother    Heart attack Mother        AMI, in late 16's   Depression Mother    Heart disease Mother    Hypertension Father    Thyroid disease Brother    Thyroid disease Sister     Social History   Socioeconomic History   Marital status: Married    Spouse name: [email or web address]   Number of children: 5   Years of education: Not on file   Highest education level: Not on file  Occupational History   Occupation: Equities trader    Employer: [employer]  Tobacco Use   Smoking status: Former    Types: Cigarettes    Quit date: 07/15/2002    Years since quitting: 18.7   Smokeless tobacco: Never  Substance and Sexual Activity   Alcohol use: No    Alcohol/week: 0.0 standard drinks   Drug use: No   Sexual activity: Yes    Partners: Male  Other Topics Concern   Not on file  Social History Narrative   Some exercise.  In bariatric program at [facility].    Social Determinants of Health   Financial Resource Strain: Not on file  Food Insecurity: Not on file  Transportation Needs: Not on file  Physical Activity: Not on file  Stress: Not on file  Social Connections: Not on file  Intimate Partner Violence: Not on file    Outpatient Medications Prior to Visit  Medication Sig Dispense Refill   BIOTIN 5000 PO Take 1 capsule by mouth.     Cholecalciferol 50 MCG (2000 UT) TABS Take 1 tablet by mouth daily.     ergocalciferol (VITAMIN D2) 1.25 MG (50000 UT) capsule Take 1 capsule by mouth 2 (two) times a week.     escitalopram (LEXAPRO) 10 MG tablet Take 1 tablet (10 mg total) by mouth daily. 90 tablet 1   fluticasone (FLONASE) 50 MCG/ACT nasal spray Place 2 sprays into both nostrils daily. 16 g 6   ipratropium (ATROVENT) 0.06 % nasal spray INSTILL 2 SPRAYS INTO BOTH NOSTRILS FOUR TIMES A DAY 45 mL 1   iron polysaccharides (NIFEREX) 150 MG capsule Take 1 capsule (150 mg total) by mouth daily. 90  capsule 3   omeprazole (PRILOSEC) 40 MG capsule Take 40 mg by mouth daily.     ondansetron (ZOFRAN ODT) 4 MG disintegrating tablet Take 1 tablet (4 mg total) by mouth every 8 (eight) hours as needed for nausea or vomiting. 10 tablet 0   Prenatal Vit-Fe Fumarate-FA (PRENATAL VITAMIN PO) Take 1 tablet by mouth daily.     rizatriptan (MAXALT-MLT) 5 MG disintegrating tablet Take 1 tablet (5 mg total) by mouth as needed for migraine. May repeat in 2 hours if needed 10 tablet 3   topiramate (TOPAMAX) 100 MG tablet Take 1 tablet (100 mg total) by mouth 2 (two) times daily. 180 tablet 1   WEGOVY 2.4 MG/0.75ML SOAJ Inject 2.4 mg into the skin once a week.     albuterol (VENTOLIN HFA) 108 (90 Base) MCG/ACT inhaler INHALE 1-2 PUFFS BY MOUTH EVERY 6 HOURS AS NEEDED FOR WHEEZE OR SHORTNESS OF BREATH 18 g 1   benzonatate (TESSALON) 100 MG capsule Take 1 capsule (100 mg total) by mouth 3 (three) times daily as needed. 30 capsule 0   HYDROcodone-acetaminophen (NORCO) 10-325 MG tablet Take 1 tablet by mouth every 8 (eight) hours as needed. 15 tablet 0   hydrOXYzine (VISTARIL) 25 MG capsule Take 1 capsule (25 mg total) by mouth 3 (three) times daily as needed. 30 capsule 0   sulfamethoxazole-trimethoprim (BACTRIM DS) 800-160 MG tablet Take 1 tablet by mouth 2 (two) times daily. For 10 days. 20 tablet 0   No facility-administered medications prior to visit.    Allergies  Allergen Reactions   Morphine Itching   Chromium Other (See Comments)    causes contact dermatitis   Neomycin    Penicillins Hives    ROS Review of Systems    Objective:    Physical Exam Constitutional:      Appearance: Normal appearance. She is well-developed.  HENT:     Head: Normocephalic and atraumatic.  Cardiovascular:     Rate and Rhythm: Normal rate and regular rhythm.     Heart sounds: Normal heart sounds.  Pulmonary:     Effort: Pulmonary effort is normal.     Breath sounds: Normal breath sounds.  Skin:    General:  Skin is warm and dry.     Comments: She has some mild erythema over the area where she had the cyst removed and a little bit of hypertrophic scarring.  No drainage.  The area is right underneath her bra strap.  Neurological:     Mental Status:  She is alert and oriented to person, place, and time.  Psychiatric:        Behavior: Behavior normal.    BP (!) 100/51   Pulse (!) 53   Ht '5\' 4"'$  (1.626 m)   Wt 202 lb 6.4 oz (91.8 kg)   SpO2 99%   BMI 34.74 kg/m  Wt Readings from Last 3 Encounters:  04/16/21 202 lb 6.4 oz (91.8 kg)  02/20/21 212 lb (96.2 kg)  02/13/21 218 lb (98.9 kg)     Health Maintenance Due  Topic Date Due   Pneumococcal Vaccine 61-25 Years old (1 - PCV) Never done   HIV Screening  Never done   Hepatitis C Screening  Never done   INFLUENZA VACCINE  03/11/2021    There are no preventive care reminders to display for this patient.  Lab Results  Component Value Date   TSH 2.676 05/18/2012   Lab Results  Component Value Date   WBC 5.7 12/06/2020   HGB 15.8 (H) 12/06/2020   HCT 47.6 (H) 12/06/2020   MCV 93.3 12/06/2020   PLT 225 12/06/2020   Lab Results  Component Value Date   NA 142 12/06/2020   K 4.5 12/06/2020   CO2 26 12/06/2020   GLUCOSE 85 12/06/2020   BUN 9 12/06/2020   CREATININE 0.77 12/06/2020   BILITOT 0.4 12/06/2020   ALKPHOS 71 05/18/2012   AST 32 12/06/2020   ALT 46 (H) 12/06/2020   PROT 6.1 12/06/2020   ALBUMIN 3.5 05/18/2012   CALCIUM 9.0 12/06/2020   GFR 86.35 07/18/2011   Lab Results  Component Value Date   CHOL 178 09/05/2019   Lab Results  Component Value Date   HDL 49 09/05/2019   Lab Results  Component Value Date   LDLCALC 112 09/05/2019   Lab Results  Component Value Date   TRIG 91 09/05/2019   Lab Results  Component Value Date   CHOLHDL 5.4 Ratio 09/06/2009   Lab Results  Component Value Date   HGBA1C 5.0 09/05/2019      Assessment & Plan:   Problem List Items Addressed This Visit        Cardiovascular and Mediastinum   Migraine    Stable on current regimen. Has only had to use her maxalt twice.        Relevant Orders   COMPLETE METABOLIC PANEL WITH GFR   Lipid panel   CBC     Digestive   GERD (gastroesophageal reflux disease)    Well controlled on current regimen. Now off the h2 blockdr.         Other   Ventral hernia with obstruction and without gangrene    She starting to get pain and tenderness over the hernia we will go ahead and place referral to general surgery for consultation.      Major depressive disorder, recurrent episode ([place])    Continue with current regimen.  We will make sure that she has refills.  Plan to follow-up in 6 months.      Relevant Orders   COMPLETE METABOLIC PANEL WITH GFR   Lipid panel   CBC   Other Visit Diagnoses     Ventral hernia without obstruction or gangrene    -  Primary   Relevant Orders   Ambulatory referral to General Surgery   COMPLETE METABOLIC PANEL WITH GFR   Lipid panel   CBC   Need for zoster vaccination       Relevant Orders   Varicella-zoster  vaccine IM (Shingrix) (Completed)   Hyperlipidemia, unspecified hyperlipidemia type       Relevant Orders   COMPLETE METABOLIC PANEL WITH GFR   Lipid panel   CBC      S/p cyst removal-skin appears to be healing really well the wound itself is closed but she is getting a little bit of hypertrophic scarring.  So we did discuss silicone sheets for about 6 months to improve scarring.  Given second shingles vaccine to complete her series today.  No orders of the defined types were placed in this encounter.   Follow-up: Return in about 6 months (around 10/14/2021) for Mood and migraines .    Beatrice Lecher, MD

## 2021-04-16 NOTE — Assessment & Plan Note (Signed)
Continue with current regimen.  We will make sure that she has refills.  Plan to follow-up in 6 months.

## 2021-04-16 NOTE — Assessment & Plan Note (Signed)
She starting to get pain and tenderness over the hernia we will go ahead and place referral to general surgery for consultation.

## 2021-04-16 NOTE — Assessment & Plan Note (Signed)
Well controlled on current regimen. Now off the h2 blockdr.

## 2021-04-16 NOTE — Assessment & Plan Note (Signed)
Stable on current regimen. Has only had to use her maxalt twice.

## 2021-04-17 LAB — LIPID PANEL
Cholesterol: 176 mg/dL (ref <200)
HDL: 49 mg/dL — ABNORMAL LOW (ref >=50)
LDL Cholesterol (Calc): 106 mg/dL (calc) — ABNORMAL HIGH
Non-HDL Cholesterol (Calc): 127 mg/dL (calc) (ref <130)
Total CHOL/HDL Ratio: 3.6 (calc) (ref <5.0)
Triglycerides: 110 mg/dL (ref <150)

## 2021-04-17 LAB — COMPLETE METABOLIC PANEL WITH GFR
AG Ratio: 1.7 (calc) (ref 1.0–2.5)
ALT: 24 U/L (ref 6–29)
AST: 20 U/L (ref 10–35)
Albumin: 3.8 g/dL (ref 3.6–5.1)
Alkaline phosphatase (APISO): 86 U/L (ref 37–153)
BUN: 9 mg/dL (ref 7–25)
CO2: 26 mmol/L (ref 20–32)
Calcium: 8.6 mg/dL (ref 8.6–10.4)
Chloride: 112 mmol/L — ABNORMAL HIGH (ref 98–110)
Creat: 0.74 mg/dL (ref 0.50–1.03)
Globulin: 2.2 g/dL (calc) (ref 1.9–3.7)
Glucose, Bld: 84 mg/dL (ref 65–99)
Potassium: 4.1 mmol/L (ref 3.5–5.3)
Sodium: 144 mmol/L (ref 135–146)
Total Bilirubin: 0.7 mg/dL (ref 0.2–1.2)
Total Protein: 6 g/dL — ABNORMAL LOW (ref 6.1–8.1)
eGFR: 98 mL/min/{1.73_m2} (ref >=60)

## 2021-04-17 LAB — CBC
HCT: 46.9 % — ABNORMAL HIGH (ref 35.0–45.0)
Hemoglobin: 15.5 g/dL (ref 11.7–15.5)
MCH: 31.7 pg (ref 27.0–33.0)
MCHC: 33 g/dL (ref 32.0–36.0)
MCV: 95.9 fL (ref 80.0–100.0)
MPV: 9.7 fL (ref 7.5–12.5)
Platelets: 206 10*3/uL (ref 140–400)
RBC: 4.89 10*6/uL (ref 3.80–5.10)
RDW: 12.3 % (ref 11.0–15.0)
WBC: 5.4 10*3/uL (ref 3.8–10.8)

## 2021-04-17 NOTE — Progress Notes (Signed)
Hi Laura [name], your total protein levels are little borderline low.  Just encourage you to make sure you are getting enough protein in your diet from things like fish, eggs, nuts, meats, dairy products etc.  Your liver enzyme is back down to normal which is great.  Your LDL cholesterol is up a little bit at 106.  Encouraged to continue work on healthy diet and regular exercise to bring that down.  Blood count looks great.

## 2021-04-29 ENCOUNTER — Encounter: Payer: Self-pay | Admitting: Family Medicine

## 2021-04-29 DIAGNOSIS — K439 Ventral hernia without obstruction or gangrene: Secondary | ICD-10-CM

## 2021-04-30 NOTE — Telephone Encounter (Signed)
Orders Placed This Encounter  Procedures   Ambulatory referral to General Surgery    Referral Priority:   Routine    Referral Type:   Surgical    Referral Reason:   Specialty Services Required    Requested Specialty:   General Surgery    Number of Visits Requested:   1

## 2021-06-07 ENCOUNTER — Ambulatory Visit: Payer: Self-pay | Admitting: General Surgery

## 2021-06-17 NOTE — Progress Notes (Signed)
DUE TO COVID-19 ONLY ONE VISITOR IS ALLOWED TO COME WITH YOU AND STAY IN THE WAITING ROOM ONLY DURING PRE OP AND PROCEDURE DAY OF SURGERY.  2 VISITOR  MAY VISIT WITH YOU AFTER SURGERY IN YOUR PRIVATE ROOM DURING VISITING HOURS ONLY!  YOU NEED TO HAVE A COVID 19 TEST ON__11/17/2022 @_  @_from  8am-3pm _____, THIS TEST MUST BE DONE BEFORE SURGERY,  Covid test is done at [place], [place] [address].  This is a drive thru.  No appt required. Please see map.                 Your procedure is scheduled on:    07/01/21   Report to [facility] Main  Entrance   Report to admitting at     [phone]     Call this number if you have problems the morning of surgery [phone]    REMEMBER: NO  SOLID FOOD CANDY OR GUM AFTER MIDNIGHT. CLEAR LIQUIDS UNTIL    0700am        . NOTHING BY MOUTH EXCEPT CLEAR LIQUIDS UNTIL  0700am   . PLEASE FINISH ENSURE DRINK PER SURGEON ORDER  WHICH NEEDS TO BE COMPLETED AT  0700am     .      CLEAR LIQUID DIET   Foods Allowed                                                                    Coffee and tea, regular and decaf                            Fruit ices (not with fruit pulp)                                      Iced Popsicles                                    Carbonated beverages, regular and diet                                    Cranberry, grape and apple juices Sports drinks like Gatorade Lightly seasoned clear broth or consume(fat free) Sugar, honey syrup ___________________________________________________________________      BRUSH YOUR TEETH MORNING OF SURGERY AND RINSE YOUR MOUTH OUT, NO CHEWING GUM CANDY OR MINTS.     Take these medicines the morning of surgery with A SIP OF WATER:  Lexapro, flonase, omeprazole, topamax   DO NOT TAKE ANY DIABETIC MEDICATIONS DAY OF YOUR SURGERY                               You may not have any metal on your body including hair pins and              piercings  Do not wear jewelry,  make-up, lotions, powders or perfumes, deodorant  Do not wear nail polish on your fingernails.  Do not shave  48 hours prior to surgery.              Men may shave face and neck.   Do not bring valuables to the hospital. [place].  Contacts, dentures or bridgework may not be worn into surgery.  Leave suitcase in the car. After surgery it may be brought to your room.     Patients discharged the day of surgery will not be allowed to drive home. IF YOU ARE HAVING SURGERY AND GOING HOME THE SAME DAY, YOU MUST HAVE AN ADULT TO DRIVE YOU HOME AND BE WITH YOU FOR 24 HOURS. YOU MAY GO HOME BY TAXI OR UBER OR ORTHERWISE, BUT AN ADULT MUST ACCOMPANY YOU HOME AND STAY WITH YOU FOR 24 HOURS.  Name and phone number of your driver:  Special Instructions: N/A              Please read over the following fact sheets you were given: _____________________________________________________________________  [facility] - Preparing for Surgery Before surgery, you can play an important role.  Because skin is not sterile, your skin needs to be as free of germs as possible.  You can reduce the number of germs on your skin by washing with CHG (chlorahexidine gluconate) soap before surgery.  CHG is an antiseptic cleaner which kills germs and bonds with the skin to continue killing germs even after washing. Please DO NOT use if you have an allergy to CHG or antibacterial soaps.  If your skin becomes reddened/irritated stop using the CHG and inform your nurse when you arrive at Short Stay. Do not shave (including legs and underarms) for at least 48 hours prior to the first CHG shower.  You may shave your face/neck. Please follow these instructions carefully:  1.  Shower with CHG Soap the night before surgery and the  morning of Surgery.  2.  If you choose to wash your hair, wash your hair first as usual with your  normal  shampoo.  3.  After you shampoo, rinse  your hair and body thoroughly to remove the  shampoo.                           4.  Use CHG as you would any other liquid soap.  You can apply chg directly  to the skin and wash                       Gently with a scrungie or clean washcloth.  5.  Apply the CHG Soap to your body ONLY FROM THE NECK DOWN.   Do not use on face/ open                           Wound or open sores. Avoid contact with eyes, ears mouth and genitals (private parts).                       Wash face,  Genitals (private parts) with your normal soap.             6.  Wash thoroughly, paying special attention to the area where your surgery  will be performed.  7.  Thoroughly rinse your body with  warm water from the neck down.  8.  DO NOT shower/wash with your normal soap after using and rinsing off  the CHG Soap.                9.  Pat yourself dry with a clean towel.            10.  Wear clean pajamas.            11.  Place clean sheets on your bed the night of your first shower and do not  sleep with pets. Day of Surgery : Do not apply any lotions/deodorants the morning of surgery.  Please wear clean clothes to the hospital/surgery center.  FAILURE TO FOLLOW THESE INSTRUCTIONS MAY RESULT IN THE CANCELLATION OF YOUR SURGERY PATIENT SIGNATURE_________________________________  NURSE SIGNATURE__________________________________  ________________________________________________________________________

## 2021-06-17 NOTE — Progress Notes (Addendum)
Anesthesia Review:  PCP: Beatrice Lecher  Cardiologist : DR Loni Beckwith -LOV 02/08/21  DR Crenshaw did ablation per pt  Chest x-ray : Pulmonology- LOV 04/04/21  EKG : 06/19/21 and  03/22/21  30 day event monitor - 02/04/21  Echo : Stress test: Cardiac Cath :  Activity level: can do a flight of stairs without difficulty  Sleep Study/ CPAP : has cpap  Fasting Blood Sugar :      / Checks Blood Sugar -- times a day:   Blood Thinner/ Instructions /Last Dose: ASA / Instructions/ Last Dose :   05/30/21- Psych Telemedicine visit  05/30/21- psych visit - telemedicine  Hx of WPW- had ablation  Heart rate at preop was 51.  Pt staes runs in the 60s,  Ekg done - heart rate 50 .  Shawn Stall made aware.  NO new orders given.  Covid test on 06/27/21.

## 2021-06-19 ENCOUNTER — Encounter (HOSPITAL_COMMUNITY): Payer: Self-pay

## 2021-06-19 ENCOUNTER — Other Ambulatory Visit: Payer: Self-pay

## 2021-06-19 ENCOUNTER — Encounter (HOSPITAL_COMMUNITY)
Admission: RE | Admit: 2021-06-19 | Discharge: 2021-06-19 | Disposition: A | Discharge status: Home / Self Care | Payer: Managed Care, Other (non HMO) | Source: Ambulatory Visit | Attending: General Surgery | Admitting: General Surgery

## 2021-06-19 VITALS — BP 106/74 | HR 51 | Temp 98.1°F | Resp 16 | Ht 64.0 in | Wt 194.0 lb

## 2021-06-19 DIAGNOSIS — K432 Incisional hernia without obstruction or gangrene: Secondary | ICD-10-CM | POA: Diagnosis not present

## 2021-06-19 DIAGNOSIS — G4730 Sleep apnea, unspecified: Secondary | ICD-10-CM | POA: Insufficient documentation

## 2021-06-19 DIAGNOSIS — I340 Nonrheumatic mitral (valve) insufficiency: Secondary | ICD-10-CM | POA: Insufficient documentation

## 2021-06-19 DIAGNOSIS — Z9989 Dependence on other enabling machines and devices: Secondary | ICD-10-CM | POA: Insufficient documentation

## 2021-06-19 DIAGNOSIS — K436 Other and unspecified ventral hernia with obstruction, without gangrene: Secondary | ICD-10-CM | POA: Insufficient documentation

## 2021-06-19 DIAGNOSIS — Z01818 Encounter for other preprocedural examination: Secondary | ICD-10-CM | POA: Diagnosis not present

## 2021-06-19 HISTORY — DX: Gastro-esophageal reflux disease without esophagitis: K21.9

## 2021-06-19 HISTORY — DX: Depression, unspecified: F32.A

## 2021-06-19 HISTORY — DX: Nausea with vomiting, unspecified: R11.2

## 2021-06-19 HISTORY — DX: Headache, unspecified: R51.9

## 2021-06-19 HISTORY — DX: Family history of other specified conditions: Z84.89

## 2021-06-19 HISTORY — DX: Pneumonia, unspecified organism: J18.9

## 2021-06-19 HISTORY — DX: Sleep apnea, unspecified: G47.30

## 2021-06-19 HISTORY — DX: Unspecified osteoarthritis, unspecified site: M19.90

## 2021-06-19 HISTORY — DX: Other specified postprocedural states: R11.2

## 2021-06-19 HISTORY — DX: Pre-excitation syndrome: I45.6

## 2021-06-19 HISTORY — DX: Personal history of urinary calculi: Z87.442

## 2021-06-19 HISTORY — DX: Other specified postprocedural states: Z98.890

## 2021-06-19 LAB — CBC
HCT: 43.7 % (ref 36.0–46.0)
Hemoglobin: 14.5 g/dL (ref 12.0–15.0)
MCH: 31.9 pg (ref 26.0–34.0)
MCHC: 33.2 g/dL (ref 30.0–36.0)
MCV: 96 fL (ref 80.0–100.0)
Platelets: 177 10*3/uL (ref 150–400)
RBC: 4.55 MIL/uL (ref 3.87–5.11)
RDW: 13 % (ref 11.5–15.5)
WBC: 5.1 10*3/uL (ref 4.0–10.5)
nRBC: 0 % (ref 0.0–0.2)

## 2021-06-19 LAB — BASIC METABOLIC PANEL
Anion gap: 4 — ABNORMAL LOW (ref 5–15)
BUN: 9 mg/dL (ref 6–20)
CO2: 24 mmol/L (ref 22–32)
Calcium: 8.5 mg/dL — ABNORMAL LOW (ref 8.9–10.3)
Chloride: 112 mmol/L — ABNORMAL HIGH (ref 98–111)
Creatinine, Ser: 0.71 mg/dL (ref 0.44–1.00)
GFR, Estimated: >60 mL/min (ref >60)
Glucose, Bld: 88 mg/dL (ref 70–99)
Potassium: 4.4 mmol/L (ref 3.5–5.1)
Sodium: 140 mmol/L (ref 135–145)

## 2021-06-20 NOTE — Progress Notes (Signed)
Anesthesia Chart Review   Case: [id number] Date/Time: 07/01/21 0945   Procedure: INCISIONAL HERNIA REPAIR WITH MESH   Anesthesia type: General   Pre-op diagnosis: INCISIONAL HERNIA   Location: [facility] ROOM 05 / [facility]   Surgeons: Kinsinger, Arta Bruce, MD       DISCUSSION:50 y.o. PONV, sleep apnea with cpap, WPW s/p ablation 2012, incisional hernia scheduled for above procedure 07/01/2021 with Dr. Gurney Maxin.   Pt last seen by cardiology 02/08/21. Stable at this visit.   Anticipate pt can proceed with planned procedure barring acute status change.   VS: BP 106/74   Pulse (!) 51   Temp 36.7 C (Oral)   Resp 16   Ht 5\' 4"  (1.626 m)   Wt 88 kg   SpO2 100%   BMI 33.30 kg/m   PROVIDERS: Hali Marry, MD is PCP   Loni Beckwith, MD is Cardiologist with [facility] LABS: Labs reviewed: Acceptable for surgery. (all labs ordered are listed, but only abnormal results are displayed)  Labs Reviewed  BASIC METABOLIC PANEL - Abnormal; Notable for the following components:      Result Value   Chloride 112 (*)    Calcium 8.5 (*)    Anion gap 4 (*)    All other components within normal limits  CBC     IMAGES:   EKG: 06/19/2021 Rate 50 bpm  Sinus bradycardia  Since last tracing rate is slower   CV: Echo 01/02/2021 Adequate 2D M-mode and color flow Doppler echocardiogram demonstrates  normal left ventricular chamber size and contractility.  Wall motion and  thickness are normal.  Ejection fraction is normal  2.  The aortic, mitral, and tricuspid valves are grossly normal in  appearance  3.  Mild mitral insufficiency is noted  4.  The atria are of normal size bilaterally  5.  Right ventricular structure and function is normal  6.  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   Sleep apnea    cpap   WPW (Wolff-Parkinson-White syndrome)    hx of ablation 2012    Past Surgical History:  Procedure Laterality Date   CESAREAN SECTION     x 2   gastric bypass surgery      LAPAROSCOPIC CHOLECYSTECTOMY     left foot surgery      SLEEVE GASTROPLASTY     SUPRAVENTRICULAR TACHYCARDIA ABLATION N/A 07/23/2011   Procedure: SUPRAVENTRICULAR TACHYCARDIA ABLATION;  Surgeon: Evans Lance, MD;  Location: [facility] CATH LAB;  Service: Cardiovascular;  Laterality: N/A;   transthoracic echcardiogram  09/15/2006    MEDICATIONS:  acetaminophen (TYLENOL) 500 MG tablet   BIOTIN 5000 PO   Calcium Carbonate (CALCIUM 600 PO)   Cholecalciferol 50 MCG (2000 UT) TABS   clobetasol ointment (TEMOVATE) 0.05 %   ergocalciferol (VITAMIN D2) 1.25 MG (50000 UT) capsule   escitalopram (LEXAPRO) 10 MG  tablet   famotidine (PEPCID AC MAXIMUM STRENGTH) 20 MG tablet   fluticasone (FLONASE) 50 MCG/ACT nasal spray   ipratropium (ATROVENT) 0.06 % nasal spray   omeprazole (PRILOSEC) 40 MG capsule   ondansetron (ZOFRAN ODT) 4 MG disintegrating tablet   Prenatal Vit-Fe Fumarate-FA (PRENATAL VITAMIN PO)   rizatriptan (MAXALT-MLT) 10 MG disintegrating tablet   topiramate (TOPAMAX) 50 MG tablet   WEGOVY 2.4 MG/0.75ML SOAJ   No current facility-administered medications for this encounter.   Konrad Felix Ward, PA-C [facility] Pre-Surgical Testing [phone]

## 2021-06-27 ENCOUNTER — Other Ambulatory Visit: Payer: Self-pay | Admitting: General Surgery

## 2021-06-27 LAB — SARS CORONAVIRUS 2 (TAT 6-24 HRS): SARS Coronavirus 2: NEGATIVE

## 2021-07-01 ENCOUNTER — Ambulatory Visit (HOSPITAL_COMMUNITY): Payer: Managed Care, Other (non HMO) | Admitting: Anesthesiology

## 2021-07-01 ENCOUNTER — Other Ambulatory Visit: Payer: Self-pay

## 2021-07-01 ENCOUNTER — Encounter (HOSPITAL_COMMUNITY): Payer: Self-pay | Admitting: General Surgery

## 2021-07-01 ENCOUNTER — Encounter (HOSPITAL_COMMUNITY)
Admission: RE | Disposition: A | Discharge status: Home / Self Care | Payer: Self-pay | Source: Home / Self Care | Attending: General Surgery

## 2021-07-01 ENCOUNTER — Observation Stay (HOSPITAL_COMMUNITY)
Admission: RE | Admit: 2021-07-01 | Discharge: 2021-07-02 | Disposition: A | Discharge status: Home / Self Care | Payer: Managed Care, Other (non HMO) | Attending: General Surgery | Admitting: General Surgery

## 2021-07-01 ENCOUNTER — Ambulatory Visit (HOSPITAL_COMMUNITY): Payer: Managed Care, Other (non HMO) | Admitting: Physician Assistant

## 2021-07-01 DIAGNOSIS — Z886 Allergy status to analgesic agent status: Secondary | ICD-10-CM | POA: Diagnosis not present

## 2021-07-01 DIAGNOSIS — Z9884 Bariatric surgery status: Secondary | ICD-10-CM | POA: Insufficient documentation

## 2021-07-01 DIAGNOSIS — Z23 Encounter for immunization: Secondary | ICD-10-CM | POA: Diagnosis not present

## 2021-07-01 DIAGNOSIS — K432 Incisional hernia without obstruction or gangrene: Principal | ICD-10-CM | POA: Diagnosis present

## 2021-07-01 HISTORY — PX: INCISIONAL HERNIA REPAIR: SHX193

## 2021-07-01 LAB — CBC
HCT: 44.2 % (ref 36.0–46.0)
Hemoglobin: 15.1 g/dL — ABNORMAL HIGH (ref 12.0–15.0)
MCH: 32.3 pg (ref 26.0–34.0)
MCHC: 34.2 g/dL (ref 30.0–36.0)
MCV: 94.6 fL (ref 80.0–100.0)
Platelets: 217 10*3/uL (ref 150–400)
RBC: 4.67 MIL/uL (ref 3.87–5.11)
RDW: 12.1 % (ref 11.5–15.5)
WBC: 12 10*3/uL — ABNORMAL HIGH (ref 4.0–10.5)
nRBC: 0 % (ref 0.0–0.2)

## 2021-07-01 LAB — CREATININE, SERUM
Creatinine, Ser: 0.81 mg/dL (ref 0.44–1.00)
GFR, Estimated: >60 mL/min (ref >60)

## 2021-07-01 SURGERY — REPAIR, HERNIA, INCISIONAL
Anesthesia: General | Site: Abdomen

## 2021-07-01 MED ORDER — AMISULPRIDE (ANTIEMETIC) 5 MG/2ML IV SOLN
INTRAVENOUS | Status: AC
  Filled 2021-07-01: qty 4

## 2021-07-01 MED ORDER — PROPOFOL 10 MG/ML IV BOLUS
INTRAVENOUS | Status: DC | PRN
  Administered 2021-07-01: 170 mg via INTRAVENOUS

## 2021-07-01 MED ORDER — KETAMINE HCL 10 MG/ML IJ SOLN
INTRAMUSCULAR | Status: DC | PRN
  Administered 2021-07-01: 25 mg via INTRAVENOUS

## 2021-07-01 MED ORDER — PROPOFOL 10 MG/ML IV BOLUS
INTRAVENOUS | Status: AC
  Filled 2021-07-01: qty 20

## 2021-07-01 MED ORDER — MIDAZOLAM HCL 2 MG/2ML IJ SOLN
INTRAMUSCULAR | Status: DC | PRN
  Administered 2021-07-01: 2 mg via INTRAVENOUS

## 2021-07-01 MED ORDER — ONDANSETRON 4 MG PO TBDP: 4.0000 mg | ORAL_TABLET | Freq: Three times a day (TID) | ORAL | Status: DC | PRN

## 2021-07-01 MED ORDER — SEMAGLUTIDE-WEIGHT MANAGEMENT 2.4 MG/0.75ML ~~LOC~~ SOAJ
2.4000 mg | SUBCUTANEOUS | Status: DC
Start: 2021-07-06 — End: 2021-07-02

## 2021-07-01 MED ORDER — OXYCODONE HCL 5 MG PO TABS
5.0000 mg | ORAL_TABLET | ORAL | Status: DC | PRN
  Administered 2021-07-01 – 2021-07-02 (×3): 5 mg via ORAL
  Filled 2021-07-01 (×3): qty 1

## 2021-07-01 MED ORDER — ACETAMINOPHEN 500 MG PO TABS: 1000.0000 mg | ORAL_TABLET | Freq: Three times a day (TID) | ORAL | Status: DC | PRN

## 2021-07-01 MED ORDER — HYDROMORPHONE HCL 1 MG/ML IJ SOLN: 0.5000 mg | INTRAMUSCULAR | Status: DC | PRN

## 2021-07-01 MED ORDER — BUPIVACAINE-EPINEPHRINE 0.25% -1:200000 IJ SOLN
INTRAMUSCULAR | Status: DC | PRN
  Administered 2021-07-01: 30 mL

## 2021-07-01 MED ORDER — SCOPOLAMINE 1 MG/3DAYS TD PT72
1.0000 | MEDICATED_PATCH | TRANSDERMAL | Status: DC
  Administered 2021-07-01: 1.5 mg via TRANSDERMAL
  Filled 2021-07-01: qty 1

## 2021-07-01 MED ORDER — FENTANYL CITRATE (PF) 250 MCG/5ML IJ SOLN
INTRAMUSCULAR | Status: AC
  Filled 2021-07-01: qty 5

## 2021-07-01 MED ORDER — CHLORHEXIDINE GLUCONATE CLOTH 2 % EX PADS: 6.0000 | MEDICATED_PAD | Freq: Once | CUTANEOUS | Status: DC

## 2021-07-01 MED ORDER — CALCIUM CARBONATE 1250 (500 CA) MG PO TABS
500.0000 mg | ORAL_TABLET | Freq: Every day | ORAL | Status: DC
  Administered 2021-07-01 – 2021-07-02 (×2): 500 mg via ORAL
  Filled 2021-07-01 (×2): qty 1

## 2021-07-01 MED ORDER — MIDAZOLAM HCL 2 MG/2ML IJ SOLN
INTRAMUSCULAR | Status: AC
  Filled 2021-07-01: qty 2

## 2021-07-01 MED ORDER — ONDANSETRON HCL 4 MG/2ML IJ SOLN
INTRAMUSCULAR | Status: DC | PRN
  Administered 2021-07-01: 4 mg via INTRAVENOUS

## 2021-07-01 MED ORDER — BUPIVACAINE-EPINEPHRINE (PF) 0.5% -1:200000 IJ SOLN
INTRAMUSCULAR | Status: AC
  Filled 2021-07-01: qty 30

## 2021-07-01 MED ORDER — SUGAMMADEX SODIUM 200 MG/2ML IV SOLN
INTRAVENOUS | Status: DC | PRN
  Administered 2021-07-01: 200 mg via INTRAVENOUS

## 2021-07-01 MED ORDER — DEXAMETHASONE SODIUM PHOSPHATE 10 MG/ML IJ SOLN
INTRAMUSCULAR | Status: DC | PRN
  Administered 2021-07-01: 5 mg via INTRAVENOUS

## 2021-07-01 MED ORDER — CEFAZOLIN SODIUM-DEXTROSE 2-4 GM/100ML-% IV SOLN
2.0000 g | INTRAVENOUS | Status: AC
  Administered 2021-07-01: 2 g via INTRAVENOUS
  Filled 2021-07-01: qty 100

## 2021-07-01 MED ORDER — PANTOPRAZOLE SODIUM 40 MG PO TBEC
40.0000 mg | DELAYED_RELEASE_TABLET | Freq: Every day | ORAL | Status: DC
  Administered 2021-07-01 – 2021-07-02 (×2): 40 mg via ORAL
  Filled 2021-07-01 (×2): qty 1

## 2021-07-01 MED ORDER — ORAL CARE MOUTH RINSE: 15.0000 mL | Freq: Once | OROMUCOSAL | Status: AC

## 2021-07-01 MED ORDER — 0.9 % SODIUM CHLORIDE (POUR BTL) OPTIME
TOPICAL | Status: DC | PRN
  Administered 2021-07-01: 1000 mL

## 2021-07-01 MED ORDER — VITAMIN D3 25 MCG (1000 UNIT) PO TABS
2000.0000 [IU] | ORAL_TABLET | Freq: Every day | ORAL | Status: DC
Start: 2021-07-01 — End: 2021-07-02
  Administered 2021-07-01 – 2021-07-02 (×2): 2000 [IU] via ORAL
  Filled 2021-07-01 (×2): qty 2

## 2021-07-01 MED ORDER — ACETAMINOPHEN 500 MG PO TABS
ORAL_TABLET | ORAL | Status: AC
  Filled 2021-07-01: qty 2

## 2021-07-01 MED ORDER — KETAMINE HCL 10 MG/ML IJ SOLN
INTRAMUSCULAR | Status: AC
  Filled 2021-07-01: qty 1

## 2021-07-01 MED ORDER — METOPROLOL TARTRATE 5 MG/5ML IV SOLN: 5.0000 mg | Freq: Four times a day (QID) | INTRAVENOUS | Status: DC | PRN

## 2021-07-01 MED ORDER — FENTANYL CITRATE PF 50 MCG/ML IJ SOSY: 25.0000 ug | PREFILLED_SYRINGE | INTRAMUSCULAR | Status: DC | PRN

## 2021-07-01 MED ORDER — DEXAMETHASONE SODIUM PHOSPHATE 10 MG/ML IJ SOLN
INTRAMUSCULAR | Status: AC
  Filled 2021-07-01: qty 1

## 2021-07-01 MED ORDER — ACETAMINOPHEN 500 MG PO TABS
1000.0000 mg | ORAL_TABLET | ORAL | Status: AC
  Administered 2021-07-01: 1000 mg via ORAL
  Filled 2021-07-01: qty 2

## 2021-07-01 MED ORDER — ONDANSETRON 4 MG PO TBDP: 4.0000 mg | ORAL_TABLET | Freq: Four times a day (QID) | ORAL | Status: DC | PRN

## 2021-07-01 MED ORDER — IPRATROPIUM BROMIDE 0.06 % NA SOLN
2.0000 | Freq: Every day | NASAL | Status: DC | PRN
  Filled 2021-07-01: qty 15

## 2021-07-01 MED ORDER — LIDOCAINE 2% (20 MG/ML) 5 ML SYRINGE
INTRAMUSCULAR | Status: DC | PRN
Start: 2021-07-01 — End: 2021-07-01
  Administered 2021-07-01: 60 mg via INTRAVENOUS

## 2021-07-01 MED ORDER — ESCITALOPRAM OXALATE 10 MG PO TABS
10.0000 mg | ORAL_TABLET | Freq: Every day | ORAL | Status: DC
  Administered 2021-07-01: 10 mg via ORAL
  Filled 2021-07-01: qty 1

## 2021-07-01 MED ORDER — BIOTIN 5 MG PO CAPS
5.0000 mg | ORAL_CAPSULE | Freq: Every day | ORAL | Status: DC
Start: 2021-07-01 — End: 2021-07-01

## 2021-07-01 MED ORDER — ROCURONIUM BROMIDE 10 MG/ML (PF) SYRINGE
PREFILLED_SYRINGE | INTRAVENOUS | Status: AC
  Filled 2021-07-01: qty 10

## 2021-07-01 MED ORDER — LIDOCAINE HCL (PF) 2 % IJ SOLN
INTRAMUSCULAR | Status: AC
  Filled 2021-07-01: qty 5

## 2021-07-01 MED ORDER — TRAMADOL HCL 50 MG PO TABS
50.0000 mg | ORAL_TABLET | Freq: Four times a day (QID) | ORAL | Status: DC | PRN
  Administered 2021-07-01: 50 mg via ORAL
  Filled 2021-07-01: qty 1

## 2021-07-01 MED ORDER — CHLORHEXIDINE GLUCONATE 0.12 % MT SOLN
15.0000 mL | Freq: Once | OROMUCOSAL | Status: AC
  Administered 2021-07-01: 15 mL via OROMUCOSAL

## 2021-07-01 MED ORDER — AMISULPRIDE (ANTIEMETIC) 5 MG/2ML IV SOLN
10.0000 mg | Freq: Once | INTRAVENOUS | Status: AC | PRN
  Administered 2021-07-01: 10 mg via INTRAVENOUS

## 2021-07-01 MED ORDER — FAMOTIDINE 20 MG PO TABS: 20.0000 mg | ORAL_TABLET | Freq: Every evening | ORAL | Status: DC | PRN

## 2021-07-01 MED ORDER — ONDANSETRON HCL 4 MG/2ML IJ SOLN
INTRAMUSCULAR | Status: AC
  Filled 2021-07-01: qty 2

## 2021-07-01 MED ORDER — DIPHENHYDRAMINE HCL 25 MG PO CAPS: 25.0000 mg | ORAL_CAPSULE | Freq: Four times a day (QID) | ORAL | Status: DC | PRN

## 2021-07-01 MED ORDER — LACTATED RINGERS IV SOLN: INTRAVENOUS | Status: DC

## 2021-07-01 MED ORDER — FLUTICASONE PROPIONATE 50 MCG/ACT NA SUSP
2.0000 | Freq: Every day | NASAL | Status: DC | PRN
  Filled 2021-07-01: qty 16

## 2021-07-01 MED ORDER — BUPIVACAINE LIPOSOME 1.3 % IJ SUSP: 20.0000 mL | Freq: Once | INTRAMUSCULAR | Status: DC

## 2021-07-01 MED ORDER — KETOROLAC TROMETHAMINE 15 MG/ML IJ SOLN
15.0000 mg | INTRAMUSCULAR | Status: AC
  Administered 2021-07-01: 15 mg via INTRAVENOUS
  Filled 2021-07-01: qty 1

## 2021-07-01 MED ORDER — TOPIRAMATE 25 MG PO TABS
50.0000 mg | ORAL_TABLET | Freq: Two times a day (BID) | ORAL | Status: DC
  Administered 2021-07-01 – 2021-07-02 (×3): 50 mg via ORAL
  Filled 2021-07-01 (×3): qty 2

## 2021-07-01 MED ORDER — ACETAMINOPHEN 500 MG PO TABS
1000.0000 mg | ORAL_TABLET | Freq: Four times a day (QID) | ORAL | Status: DC
  Administered 2021-07-01 – 2021-07-02 (×2): 1000 mg via ORAL
  Filled 2021-07-01 (×2): qty 2

## 2021-07-01 MED ORDER — DEXTROSE-NACL 5-0.45 % IV SOLN: INTRAVENOUS | Status: DC

## 2021-07-01 MED ORDER — BUPIVACAINE LIPOSOME 1.3 % IJ SUSP
INTRAMUSCULAR | Status: DC | PRN
  Administered 2021-07-01: 20 mL

## 2021-07-01 MED ORDER — ONDANSETRON HCL 4 MG/2ML IJ SOLN: 4.0000 mg | Freq: Four times a day (QID) | INTRAMUSCULAR | Status: DC | PRN

## 2021-07-01 MED ORDER — DIPHENHYDRAMINE HCL 50 MG/ML IJ SOLN: 25.0000 mg | Freq: Four times a day (QID) | INTRAMUSCULAR | Status: DC | PRN

## 2021-07-01 MED ORDER — SIMETHICONE 80 MG PO CHEW: 40.0000 mg | CHEWABLE_TABLET | Freq: Four times a day (QID) | ORAL | Status: DC | PRN

## 2021-07-01 MED ORDER — BUPIVACAINE LIPOSOME 1.3 % IJ SUSP
INTRAMUSCULAR | Status: AC
  Filled 2021-07-01: qty 20

## 2021-07-01 MED ORDER — SUMATRIPTAN SUCCINATE 50 MG PO TABS
100.0000 mg | ORAL_TABLET | ORAL | Status: DC | PRN
  Filled 2021-07-01: qty 2

## 2021-07-01 MED ORDER — FENTANYL CITRATE (PF) 250 MCG/5ML IJ SOLN
INTRAMUSCULAR | Status: DC | PRN
  Administered 2021-07-01: 100 ug via INTRAVENOUS
  Administered 2021-07-01 (×3): 50 ug via INTRAVENOUS

## 2021-07-01 MED ORDER — ENOXAPARIN SODIUM 40 MG/0.4ML IJ SOSY
40.0000 mg | PREFILLED_SYRINGE | INTRAMUSCULAR | Status: DC
  Administered 2021-07-02: 40 mg via SUBCUTANEOUS
  Filled 2021-07-01: qty 0.4

## 2021-07-01 MED ORDER — ROCURONIUM BROMIDE 10 MG/ML (PF) SYRINGE
PREFILLED_SYRINGE | INTRAVENOUS | Status: DC | PRN
  Administered 2021-07-01: 10 mg via INTRAVENOUS
  Administered 2021-07-01: 60 mg via INTRAVENOUS

## 2021-07-01 SURGICAL SUPPLY — 44 items
ADH SKN CLS APL DERMABOND .7 (GAUZE/BANDAGES/DRESSINGS) ×1
APL PRP STRL LF DISP 70% ISPRP (MISCELLANEOUS) ×1
APL SKNCLS STERI-STRIP NONHPOA (GAUZE/BANDAGES/DRESSINGS)
BAG COUNTER SPONGE SURGICOUNT (BAG) IMPLANT
BAG SPNG CNTER NS LX DISP (BAG)
BENZOIN TINCTURE PRP APPL 2/3 (GAUZE/BANDAGES/DRESSINGS) IMPLANT
BINDER ABDOMINAL 12 ML 46-62 (SOFTGOODS) ×1 IMPLANT
CHLORAPREP W/TINT 26 (MISCELLANEOUS) ×2 IMPLANT
COVER SURGICAL LIGHT HANDLE (MISCELLANEOUS) ×2 IMPLANT
DECANTER SPIKE VIAL GLASS SM (MISCELLANEOUS) ×2 IMPLANT
DERMABOND ADVANCED (GAUZE/BANDAGES/DRESSINGS) ×1
DERMABOND ADVANCED .7 DNX12 (GAUZE/BANDAGES/DRESSINGS) IMPLANT
DRAIN CHANNEL 19F RND (DRAIN) IMPLANT
DRAPE LAPAROSCOPIC ABDOMINAL (DRAPES) ×2 IMPLANT
DRSG TELFA 4X10 ISLAND STR (GAUZE/BANDAGES/DRESSINGS) IMPLANT
DRSG TELFA 4X8 ISLAND (GAUZE/BANDAGES/DRESSINGS) IMPLANT
ELECT REM PT RETURN 15FT ADLT (MISCELLANEOUS) ×2 IMPLANT
EVACUATOR SILICONE 100CC (DRAIN) IMPLANT
GLOVE SURG POLYISO LF SZ7 (GLOVE) ×2 IMPLANT
GLOVE SURG UNDER POLY LF SZ7 (GLOVE) ×2 IMPLANT
GOWN STRL REUS W/TWL LRG LVL3 (GOWN DISPOSABLE) ×2 IMPLANT
GOWN STRL REUS W/TWL XL LVL3 (GOWN DISPOSABLE) ×2 IMPLANT
KIT BASIN OR (CUSTOM PROCEDURE TRAY) ×2 IMPLANT
KIT TURNOVER KIT A (KITS) IMPLANT
MESH HERNIA 6X6 BARD (Mesh General) IMPLANT
MESH HERNIA BARD 6X6 (Mesh General) ×1 IMPLANT
NEEDLE HYPO 22GX1.5 SAFETY (NEEDLE) ×2 IMPLANT
PACK GENERAL/GYN (CUSTOM PROCEDURE TRAY) ×2 IMPLANT
SPONGE DRAIN TRACH 4X4 STRL 2S (GAUZE/BANDAGES/DRESSINGS) IMPLANT
SPONGE T-LAP 18X18 ~~LOC~~+RFID (SPONGE) ×1 IMPLANT
STRIP CLOSURE SKIN 1/2X4 (GAUZE/BANDAGES/DRESSINGS) IMPLANT
SUT ETHILON 2 0 PS N (SUTURE) IMPLANT
SUT MNCRL AB 4-0 PS2 18 (SUTURE) ×2 IMPLANT
SUT NOVA 0 T19/GS 22DT (SUTURE) IMPLANT
SUT NOVA NAB GS-21 0 18 T12 DT (SUTURE) ×1 IMPLANT
SUT PDS AB 0 CT1 36 (SUTURE) ×6 IMPLANT
SUT VIC AB 2-0 CT1 27 (SUTURE)
SUT VIC AB 2-0 CT1 TAPERPNT 27 (SUTURE) IMPLANT
SUT VIC AB 3-0 SH 18 (SUTURE) ×2 IMPLANT
SUT VIC AB 3-0 SH 27 (SUTURE)
SUT VIC AB 3-0 SH 27XBRD (SUTURE) IMPLANT
SYR 20ML LL LF (SYRINGE) ×2 IMPLANT
TOWEL OR 17X26 10 PK STRL BLUE (TOWEL DISPOSABLE) ×2 IMPLANT
TOWEL OR NON WOVEN STRL DISP B (DISPOSABLE) ×2 IMPLANT

## 2021-07-01 NOTE — Op Note (Signed)
Preoperative diagnosis: incisional hernia  Postoperative diagnosis: same   Procedure: incisional hernia repair with mesh, placed in the retrorectus space (within the fascia)  Surgeon: Gurney Maxin, M.D.  Asst: none  Anesthesia: general  Indications for procedure: Laura [name] is a 50 y.o. year old female with symptoms of abdominal pain and abdominal wall bulges. She presents for hernia repair.  Description of procedure: The patient was brought into the operative suite. Anesthesia was administered with General endotracheal anesthesia. WHO checklist was applied. The patient was then placed in supine position. The area was prepped and draped in the usual sterile fashion.  Next, a midline incision was made. Cautery was used to dissect down through the subcutaneous tissue. The fascia was identified. There was a small hernia identified at the umbilical area. The hernia sac  was dissected free. Palpation of the under layer of the fascia allowed identification of 2 additional hernias, 1 5 mm hernia in the mid upper abdomen and 1 4 cm hernia in the superior upper abdomen. The fascia was further dissected free of the subcutaneous tissue and the additional hernia sacs were identified.  Next, the rectus sheath was entered on the left and right and the anterior and posterior sheaths were separated on the medial aspect. This was continued from the subxiphoid area down to below the umbilical area. The posterior sheath was closed over the hernias and continued over the midline the length of the incision with a 0 PDS in running fashion. Marcaine/exparel was injected into the rectus muscle and out laterally. Next a 15 x 15 cm Bard mesh was inserted into the retrorectus space in a diagonal positioning and sutured to the posterior rectus in 9 positions with 0 Novafil. The left and right anterior rectus sheaths were then sutured back to midline with 0 PDS in running fashion. Cautery was used to ensure hemostasis.  The subuctaneous tissue was apposed with interrupted 3-0 vicryl. The umbilical skin was sutured to the fascia with 3-0 vicryl. The skin was closed with running 4-0 monocryl. Dermabond was put in place for dressing. The patient tolerated the procedure and was brought to PACU in stable condition.  Findings: 3 midline hernias of 4 cm, 1 cm, and 3 cm  Specimen: none  Implant:  Bard mesh   Blood loss: 30 ml  Local anesthesia:  50 ml Exparel:Marcaine Mix  Complications: none  Gurney Maxin, M.D. General, Bariatric, & Minimally Invasive Surgery [facility]

## 2021-07-01 NOTE — Anesthesia Procedure Notes (Signed)
Procedure Name: Intubation Date/Time: 07/01/2021 9:25 AM Performed by: Lollie Sails, CRNA Pre-anesthesia Checklist: Patient identified, Emergency Drugs available, Suction available, Patient being monitored and Timeout performed Patient Re-evaluated:Patient Re-evaluated prior to induction Oxygen Delivery Method: Circle system utilized Preoxygenation: Pre-oxygenation with 100% oxygen Induction Type: IV induction Ventilation: Mask ventilation without difficulty Laryngoscope Size: Miller and 3 Grade View: Grade I Tube type: Oral Tube size: 7.5 mm Number of attempts: 1 Airway Equipment and Method: Stylet Placement Confirmation: ETT inserted through vocal cords under direct vision, positive ETCO2 and breath sounds checked- equal and bilateral Secured at: 22 cm Tube secured with: Tape Dental Injury: Teeth and Oropharynx as per pre-operative assessment

## 2021-07-01 NOTE — Anesthesia Postprocedure Evaluation (Signed)
Anesthesia Post Note  Patient: Research scientist (medical)  Procedure(s) Performed: INCISIONAL HERNIA REPAIR WITH MESH (Abdomen)     Patient location during evaluation: PACU Anesthesia Type: General Level of consciousness: awake and alert Pain management: pain level controlled Vital Signs Assessment: post-procedure vital signs reviewed and stable Respiratory status: spontaneous breathing, nonlabored ventilation, respiratory function stable and patient connected to nasal cannula oxygen Cardiovascular status: blood pressure returned to baseline and stable Postop Assessment: no apparent nausea or vomiting Anesthetic complications: no   No notable events documented.  Last Vitals:  Vitals:   07/01/21 1315 07/01/21 1358  BP: 110/71 108/74  Pulse: 63 64  Resp: 16 15  Temp:  36.4 C  SpO2: 99% 97%    Last Pain:  Vitals:   07/01/21 1358  TempSrc: Oral  PainSc:                  Tiajuana Amass

## 2021-07-01 NOTE — Transfer of Care (Signed)
Immediate Anesthesia Transfer of Care Note  Patient: Laura [name]  Procedure(s) Performed: INCISIONAL HERNIA REPAIR WITH MESH (Abdomen)  Patient Location: PACU  Anesthesia Type:General  Level of Consciousness: awake, drowsy and patient cooperative  Airway & Oxygen Therapy: Patient Spontanous Breathing and Patient connected to face mask oxygen  Post-op Assessment: Report given to RN and Post -op Vital signs reviewed and stable  Post vital signs: Reviewed and stable  Last Vitals:  Vitals Value Taken Time  BP 128/74 07/01/21 1112  Temp    Pulse 69 07/01/21 1113  Resp 12 07/01/21 1112  SpO2 100 % 07/01/21 1113  Vitals shown include unvalidated device data.  Last Pain:  Vitals:   07/01/21 0821  TempSrc: Oral  PainSc: 0-No pain         Complications: No notable events documented.

## 2021-07-01 NOTE — Plan of Care (Signed)
Pt ambulated to chair from stretcher then to bathroom where she voided 500 cc;s . Pt complains of 2/10 pain and required nothing for the pain at this time.

## 2021-07-01 NOTE — H&P (Signed)
History of Present Illness: Laura [name] is a 50 y.o. female who is seen today as an office consultation at the request of Dr. Madilyn Fireman for evaluation of incisional hernia  She has a history of 2 c sections the second with a 4 fetus pregnancy. She has had a hernia for a long time. She had 100 lb weight gain from her second pregnancy and required bariatric surgery for correction with sleeve gastrectomy and then conversion to RNY gastric bypass.  She has difficulty sitting or bending over. She notices a bulge in her upper abdomen with movement. She has pain in the area with activity that is sharp and nonradiating. She deneis nausea or vomiting or constipation.  She does not smoke. She does not have diabetes.  Review of Systems: A complete review of systems was obtained from the patient. I have reviewed this information and discussed as appropriate with the patient. See HPI as well for other ROS.  Review of Systems  Constitutional: Negative.  HENT: Negative.  Eyes: Negative.  Respiratory: Negative.  Cardiovascular: Negative.  Gastrointestinal: Positive for abdominal pain.  Genitourinary: Negative.  Musculoskeletal: Negative.  Skin: Negative.  Neurological: Negative.  Endo/Heme/Allergies: Negative.  Psychiatric/Behavioral: Negative.    Medical History: Past Medical History:  Diagnosis Date   Arrhythmia   GERD (gastroesophageal reflux disease)   Sleep apnea   There is no problem list on file for this patient.  Past Surgical History:  Procedure Laterality Date   bariactric surgery   cardiac ablasion N/A   foot surgery   gallbladder surgery   REPEAT CESAREAN SECTION    Allergies  Allergen Reactions   Morphine Itching   Chromium Other (See Comments)  causes contact dermatitis causes contact dermatitis   Morphine (Pf) Itching   Neomycin Rash  Pt. States it "eats my skin and causes it to blister" Blisters Skin blisters   Penicillins Hives   Current Outpatient  Medications on File Prior to Visit  Medication Sig Dispense Refill   ergocalciferol, vitamin D2, 1,250 mcg (50,000 unit) capsule ergocalciferol (vitamin D2) 1,250 mcg (50,000 unit) capsule   escitalopram oxalate (LEXAPRO) 10 MG tablet escitalopram 10 mg tablet   omeprazole (PRILOSEC) 40 MG DR capsule Take by mouth   biotin 10 mg Tab Take 1 tablet by mouth once daily   calcium carbonate-vitamin D3 (OS-CAL 500+D) 500 mg-5 mcg (200 unit) tablet Take 1 tablet by mouth once daily   multivitamin (THERA) tablet Take 1 tablet by mouth 2 (two) times daily   topiramate (TOPAMAX) 50 MG tablet Take 50 mg by mouth 2 (two) times daily   No current facility-administered medications on file prior to visit.   Family History  Problem Relation Age of Onset   Stroke Mother   Obesity Mother   High blood pressure (Hypertension) Mother   Heart valve disease Mother   Deep vein thrombosis (DVT or abnormal blood clot formation) Mother   Colon cancer Mother   Breast cancer Mother   Stroke Father   Obesity Father   Hyperlipidemia (Elevated cholesterol) Father   Heart valve disease Father    Social History   Tobacco Use  Smoking Status Never Smoker  Smokeless Tobacco Never Used    Social History   Socioeconomic History   Marital status: Married  Tobacco Use   Smoking status: Never Smoker   Smokeless tobacco: Never Used  Scientific laboratory technician Use: Never used  Substance and Sexual Activity   Alcohol use: Never   Drug use: Never  Objective:   Vitals:  05/16/21 1511  BP: 122/76  Pulse: 76  Temp: 36.8 C (98.2 F)  SpO2: 96%  Weight: 89.3 kg (196 lb 12.8 oz)  Height: 162.6 cm (5\' 4" )   Body mass index is 33.78 kg/m.  Physical Exam Constitutional:  Appearance: Normal appearance.  HENT:  Head: Normocephalic and atraumatic.  Pulmonary:  Effort: Pulmonary effort is normal.  Abdominal:  Comments: Upper middle hernia reducible in supine position  Musculoskeletal:  General: Normal range  of motion.  Cervical back: Normal range of motion.  Neurological:  General: No focal deficit present.  Mental Status: She is alert and oriented to person, place, and time. Mental status is at baseline.  Psychiatric:  Mood and Affect: Mood normal.  Behavior: Behavior normal.  Thought Content: Thought content normal.     Labs, Imaging and Diagnostic Testing: I reviewed notes of Nobie Putnam. I reviewed her CT scan showing 2 supraumbilical hernias  Assessment and Plan:  Diagnoses and all orders for this visit:  Incisional hernia, without obstruction or gangrene  S/P gastric bypass  Class 1 obesity due to excess calories with serious comorbidity and body mass index (BMI) of 33.0 to 33.9 in adult    She has moderate sized upper abdominal incisional hernias. The patient has a symptomatic reducible hernia. We discussed the etiology of his hernia, the risk of it enlarging, incarceration, obstruction, strangulation, and that it is unlikely to get smaller or better on its own. We discussed operative options of laparoscopic vs open repair with mesh including the risks of recurrence, injury to intestines or abdominal organs, or chronic pain associated with mesh. We decided to proceed with open incisional hernia repair with retrorectus technique and overnight stay.

## 2021-07-01 NOTE — Progress Notes (Signed)
PHARMACIST - PHYSICIAN ORDER COMMUNICATION  CONCERNING: P&T Medication Policy on Herbal Medications  DESCRIPTION:  This patient's order for:  biotin  has been noted.  This product(s) is classified as an "herbal" or natural product. Due to a lack of definitive safety studies or FDA approval, nonstandard manufacturing practices, plus the potential risk of unknown drug-drug interactions while on inpatient medications, the Pharmacy and Therapeutics Committee does not permit the use of "herbal" or natural products of this type within [place].   ACTION TAKEN: The pharmacy department is unable to verify this order at this time Please reevaluate patient's clinical condition at discharge and address if the herbal or natural product(s) should be resumed at that time.   

## 2021-07-01 NOTE — Anesthesia Preprocedure Evaluation (Addendum)
Anesthesia Evaluation  Patient identified by MRN, date of birth, ID band Patient awake    Reviewed: Allergy & Precautions, NPO status , Patient's Chart, lab work & pertinent test results  Airway Mallampati: II  TM Distance: >3 FB Neck ROM: Full    Dental  (+) Dental Advisory Given   Pulmonary sleep apnea and Continuous Positive Airway Pressure Ventilation , former smoker,    breath sounds clear to auscultation       Cardiovascular negative cardio ROS   Rhythm:Regular Rate:Normal     Neuro/Psych  Headaches,  Neuromuscular disease    GI/Hepatic Neg liver ROS, GERD  ,  Endo/Other  negative endocrine ROS  Renal/GU negative Renal ROS     Musculoskeletal  (+) Arthritis ,   Abdominal   Peds  Hematology negative hematology ROS (+)   Anesthesia Other Findings   Reproductive/Obstetrics                            Lab Results  Component Value Date   WBC 5.1 06/19/2021   HGB 14.5 06/19/2021   HCT 43.7 06/19/2021   MCV 96.0 06/19/2021   PLT 177 06/19/2021   Lab Results  Component Value Date   CREATININE 0.71 06/19/2021   BUN 9 06/19/2021   NA 140 06/19/2021   K 4.4 06/19/2021   CL 112 (H) 06/19/2021   CO2 24 06/19/2021    Anesthesia Physical Anesthesia Plan  ASA: 2  Anesthesia Plan: General   Post-op Pain Management: Tylenol PO (pre-op), Toradol IV (intra-op) and Ketamine IV   Induction: Intravenous  PONV Risk Score and Plan: 4 or greater and Scopolamine patch - Pre-op, Midazolam, Dexamethasone and Ondansetron  Airway Management Planned: Oral ETT  Additional Equipment: None  Intra-op Plan:   Post-operative Plan: Extubation in OR  Informed Consent: I have reviewed the patients History and Physical, chart, labs and discussed the procedure including the risks, benefits and alternatives for the proposed anesthesia with the patient or authorized representative who has indicated  his/her understanding and acceptance.     Dental advisory given  Plan Discussed with:   Anesthesia Plan Comments:         Anesthesia Quick Evaluation

## 2021-07-02 ENCOUNTER — Encounter (HOSPITAL_COMMUNITY): Payer: Self-pay | Admitting: General Surgery

## 2021-07-02 DIAGNOSIS — K432 Incisional hernia without obstruction or gangrene: Secondary | ICD-10-CM | POA: Diagnosis not present

## 2021-07-02 LAB — CBC
HCT: 41.9 % (ref 36.0–46.0)
Hemoglobin: 14.2 g/dL (ref 12.0–15.0)
MCH: 32.2 pg (ref 26.0–34.0)
MCHC: 33.9 g/dL (ref 30.0–36.0)
MCV: 95 fL (ref 80.0–100.0)
Platelets: 194 10*3/uL (ref 150–400)
RBC: 4.41 MIL/uL (ref 3.87–5.11)
RDW: 12 % (ref 11.5–15.5)
WBC: 16.2 10*3/uL — ABNORMAL HIGH (ref 4.0–10.5)
nRBC: 0 % (ref 0.0–0.2)

## 2021-07-02 LAB — BASIC METABOLIC PANEL
Anion gap: 5 (ref 5–15)
BUN: 10 mg/dL (ref 6–20)
CO2: 27 mmol/L (ref 22–32)
Calcium: 8.8 mg/dL — ABNORMAL LOW (ref 8.9–10.3)
Chloride: 109 mmol/L (ref 98–111)
Creatinine, Ser: 0.72 mg/dL (ref 0.44–1.00)
GFR, Estimated: >60 mL/min (ref >60)
Glucose, Bld: 112 mg/dL — ABNORMAL HIGH (ref 70–99)
Potassium: 4.5 mmol/L (ref 3.5–5.1)
Sodium: 141 mmol/L (ref 135–145)

## 2021-07-02 MED ORDER — OXYCODONE HCL 5 MG PO TABS: 5.0000 mg | ORAL_TABLET | Freq: Four times a day (QID) | ORAL | 0 refills | Status: DC | PRN

## 2021-07-02 MED ORDER — INFLUENZA VAC A&B SA ADJ QUAD 0.5 ML IM PRSY: 0.5000 mL | PREFILLED_SYRINGE | INTRAMUSCULAR | Status: DC

## 2021-07-02 MED ORDER — INFLUENZA VAC SPLIT QUAD 0.5 ML IM SUSY
0.5000 mL | PREFILLED_SYRINGE | Freq: Once | INTRAMUSCULAR | Status: AC
  Administered 2021-07-02: 0.5 mL via INTRAMUSCULAR

## 2021-07-02 NOTE — Plan of Care (Signed)

## 2021-07-02 NOTE — Discharge Summary (Signed)
Physician Discharge Summary  Laura [name] GYJ:[id number] DOB: [birth year] DOA: 07/01/2021  PCP: Hali Marry, MD  Admit date: 07/01/2021 Discharge date: 07/02/2021  Recommendations for Outpatient Follow-up:   (include homehealth, outpatient follow-up instructions, specific recommendations for PCP to follow-up on, etc.)   Discharge Diagnoses:  Principal Problem:   Incisional hernia   Surgical Procedure: incisional hernia repair with mesh  Discharge Condition: Good Disposition: Home  Diet recommendation: carb modified diet   Hospital Course:  50 yo female presented for surgery. Postop she was watched overnight and had moderate pain control. She was able to ambulate and discharged home POD 1.  Discharge Instructions  Discharge Instructions     Call MD for:  difficulty breathing, headache or visual disturbances   Complete by: As directed    Call MD for:  hives   Complete by: As directed    Call MD for:  persistant nausea and vomiting   Complete by: As directed    Call MD for:  redness, tenderness, or signs of infection (pain, swelling, redness, odor or green/yellow discharge around incision site)   Complete by: As directed    Call MD for:  severe uncontrolled pain   Complete by: As directed    Call MD for:  temperature >100.4   Complete by: As directed    Diet - low sodium heart healthy   Complete by: As directed    Discharge wound care:   Complete by: As directed    Ok to shower tomorrow. Glue will likely peel off in 1-3 weeks. No bandage required   Driving Restrictions   Complete by: As directed    No driving while on narcotics   Increase activity slowly   Complete by: As directed    Lifting restrictions   Complete by: As directed    No lifting greater than 20 pounds for 3 weeks      Allergies as of 07/02/2021       Reactions   Morphine Itching   Chromium Other (See Comments)   causes contact dermatitis   Neomycin    Blisters on hands    Penicillins Hives        Medication List     TAKE these medications    acetaminophen 500 MG tablet Commonly known as: TYLENOL Take 1,000 mg by mouth every 8 (eight) hours as needed for moderate pain.   BIOTIN 5000 PO Take 5,000 mcg by mouth daily.   CALCIUM 600 PO Take 600 mg by mouth daily.   Cholecalciferol 50 MCG (2000 UT) Tabs Take 2,000 Units by mouth daily.   clobetasol ointment 0.05 % Commonly known as: TEMOVATE Apply 1 application topically 2 (two) times daily as needed (eczema).   ergocalciferol 1.25 MG (50000 UT) capsule Commonly known as: VITAMIN D2 Take 1 capsule by mouth every Monday, Wednesday, and Friday.   escitalopram 10 MG tablet Commonly known as: LEXAPRO Take 1 tablet (10 mg total) by mouth daily.   famotidine 20 MG tablet Commonly known as: PEPCID Take 20 mg by mouth at bedtime as needed for heartburn or indigestion.   fluticasone 50 MCG/ACT nasal spray Commonly known as: FLONASE Place 2 sprays into both nostrils daily. What changed:  when to take this reasons to take this   ipratropium 0.06 % nasal spray Commonly known as: ATROVENT INSTILL 2 SPRAYS INTO BOTH NOSTRILS FOUR TIMES A DAY What changed: See the new instructions.   omeprazole 40 MG capsule Commonly known as: PRILOSEC Take 40 mg by  mouth in the morning and at bedtime.   ondansetron 4 MG disintegrating tablet Commonly known as: Zofran ODT Take 1 tablet (4 mg total) by mouth every 8 (eight) hours as needed for nausea or vomiting.   oxyCODONE 5 MG immediate release tablet Commonly known as: Oxy IR/ROXICODONE Take 1 tablet (5 mg total) by mouth every 6 (six) hours as needed for severe pain.   PRENATAL VITAMIN PO Take 1 tablet by mouth daily.   rizatriptan 10 MG disintegrating tablet Commonly known as: MAXALT-MLT Take 10 mg by mouth as needed for migraine. May repeat in 2 hours if needed   topiramate 50 MG tablet Commonly known as: TOPAMAX Take 50 mg by mouth 2 (two)  times daily.   Wegovy 2.4 MG/0.75ML Soaj Generic drug: Semaglutide-Weight Management Inject 2.4 mg into the skin every Saturday.               Discharge Care Instructions  (From admission, onward)           Start     Ordered   07/02/21 0000  Discharge wound care:       Comments: Ok to shower tomorrow. Glue will likely peel off in 1-3 weeks. No bandage required   07/02/21 0731              The results of significant diagnostics from this hospitalization (including imaging, microbiology, ancillary and laboratory) are listed below for reference.    Significant Diagnostic Studies: No results found.  Labs: Basic Metabolic Panel: Recent Labs  Lab 07/01/21 1402 07/02/21 0410  NA  --  141  K  --  4.5  CL  --  109  CO2  --  27  GLUCOSE  --  112*  BUN  --  10  CREATININE 0.81 0.72  CALCIUM  --  8.8*   Liver Function Tests: No results for input(s): AST, ALT, ALKPHOS, BILITOT, PROT, ALBUMIN in the last 168 hours.  CBC: Recent Labs  Lab 07/01/21 1402 07/02/21 0410  WBC 12.0* 16.2*  HGB 15.1* 14.2  HCT 44.2 41.9  MCV 94.6 95.0  PLT 217 194    CBG: No results for input(s): GLUCAP in the last 168 hours.  Principal Problem:   Incisional hernia   Time coordinating discharge: 15 min

## 2021-07-02 NOTE — Discharge Instructions (Signed)
[facility] _______[facility]  UMBILICAL OR INGUINAL HERNIA REPAIR: POST OP INSTRUCTIONS  Always review your discharge instruction sheet given to you by the facility where your surgery was performed. IF YOU HAVE DISABILITY OR FAMILY LEAVE FORMS, YOU MUST BRING THEM TO THE OFFICE FOR PROCESSING.   DO NOT GIVE THEM TO YOUR DOCTOR.  1. A  prescription for pain medication may be given to you upon discharge.  Take your pain medication as prescribed, if needed.  If narcotic pain medicine is not needed, then you may take acetaminophen (Tylenol) or ibuprofen (Advil) as needed. 2. Take your usually prescribed medications unless otherwise directed. If you need a refill on your pain medication, please contact your pharmacy.  They will contact our office to request authorization. Prescriptions will not be filled after 5 pm or on week-ends. 3. You should follow a light diet the first 24 hours after arrival home, such as soup and crackers, etc.  Be sure to include lots of fluids daily.  Resume your normal diet the day after surgery. 4.Most patients will experience some swelling and bruising around the umbilicus or in the groin and scrotum.  Ice packs and reclining will help.  Swelling and bruising can take several days to resolve.  6. It is common to experience some constipation if taking pain medication after surgery.  Increasing fluid intake and taking a stool softener (such as Colace) will usually help or prevent this problem from occurring.  A mild laxative (Milk of Magnesia or Miralax) should be taken according to package directions if there are no bowel movements after 48 hours. 7. Unless discharge instructions indicate otherwise, you may remove your bandages 24-48 hours after surgery, and you may shower at that time.  You may have steri-strips (small skin tapes) in place directly over the incision.  These strips should be left on the skin for 7-10 days.  If your surgeon used skin glue on the  incision, you may shower in 24 hours.  The glue will flake off over the next 2-3 weeks.  Any sutures or staples will be removed at the office during your follow-up visit. 8. ACTIVITIES:  You may resume regular (light) daily activities beginning the next day--such as daily self-care, walking, climbing stairs--gradually increasing activities as tolerated.  You may have sexual intercourse when it is comfortable.  Refrain from any heavy lifting or straining until approved by your doctor.  a.You may drive when you are no longer taking prescription pain medication, you can comfortably wear a seatbelt, and you can safely maneuver your car and apply brakes. b.RETURN TO WORK:   _____________________________________________  9.You should see your doctor in the office for a follow-up appointment approximately 2-3 weeks after your surgery.  Make sure that you call for this appointment within a day or two after you arrive home to insure a convenient appointment time. 10.OTHER INSTRUCTIONS: _________________________    _____________________________________  WHEN TO CALL YOUR DOCTOR: Fever over 101.0 Inability to urinate Nausea and/or vomiting Extreme swelling or bruising Continued bleeding from incision. Increased pain, redness, or drainage from the incision  The clinic staff is available to answer your questions during regular business hours.  Please don't hesitate to call and ask to speak to one of the nurses for clinical concerns.  If you have a medical emergency, go to the nearest emergency room or call 911.  A surgeon from [facility] is always on call at the hospital   [address], [address],   [place], [place]  [address] ?  [address] [address], [place],    [address] [phone] ? [phone] ? FAX [phone] Web site: [email or web address]  

## 2021-08-15 ENCOUNTER — Other Ambulatory Visit (HOSPITAL_BASED_OUTPATIENT_CLINIC_OR_DEPARTMENT_OTHER): Payer: Self-pay | Admitting: Family Medicine

## 2021-08-15 DIAGNOSIS — Z1231 Encounter for screening mammogram for malignant neoplasm of breast: Secondary | ICD-10-CM

## 2021-08-22 ENCOUNTER — Ambulatory Visit (INDEPENDENT_AMBULATORY_CARE_PROVIDER_SITE_OTHER): Payer: Managed Care, Other (non HMO)

## 2021-08-22 ENCOUNTER — Other Ambulatory Visit: Payer: Self-pay

## 2021-08-22 DIAGNOSIS — Z1231 Encounter for screening mammogram for malignant neoplasm of breast: Secondary | ICD-10-CM | POA: Diagnosis not present

## 2021-08-22 NOTE — Progress Notes (Signed)
Please call patient. Normal mammogram.  Repeat in 1 year.  

## 2021-08-26 ENCOUNTER — Encounter: Payer: Self-pay | Admitting: Physician Assistant

## 2021-08-26 ENCOUNTER — Ambulatory Visit (INDEPENDENT_AMBULATORY_CARE_PROVIDER_SITE_OTHER): Payer: Managed Care, Other (non HMO) | Admitting: Physician Assistant

## 2021-08-26 ENCOUNTER — Other Ambulatory Visit: Payer: Self-pay

## 2021-08-26 VITALS — BP 102/69 | HR 61 | Ht 64.0 in | Wt 190.1 lb

## 2021-08-26 DIAGNOSIS — R197 Diarrhea, unspecified: Secondary | ICD-10-CM

## 2021-08-26 DIAGNOSIS — R630 Anorexia: Secondary | ICD-10-CM | POA: Diagnosis not present

## 2021-08-26 DIAGNOSIS — R110 Nausea: Secondary | ICD-10-CM

## 2021-08-26 DIAGNOSIS — R1011 Right upper quadrant pain: Secondary | ICD-10-CM | POA: Diagnosis not present

## 2021-08-26 MED ORDER — ALUM & MAG HYDROXIDE-SIMETH 200-200-20 MG/5ML PO SUSP
30.0000 mL | Freq: Once | ORAL | Status: AC
  Administered 2021-08-26: 30 mL via ORAL

## 2021-08-26 MED ORDER — HYOSCYAMINE SULFATE 0.125 MG PO TBDP
0.1250 mg | ORAL_TABLET | Freq: Once | ORAL | Status: AC
  Administered 2021-08-26: 0.125 mg via SUBLINGUAL

## 2021-08-26 MED ORDER — LIDOCAINE VISCOUS HCL 2 % MT SOLN
15.0000 mL | Freq: Once | OROMUCOSAL | Status: AC
  Administered 2021-08-26: 15 mL via OROMUCOSAL

## 2021-08-26 NOTE — Progress Notes (Signed)
Subjective:    Patient ID: Laura [name], female    DOB: 12-Jun-1971, 51 y.o.   MRN: [id number]  HPI 51 YO female with a PMH triple hernia repair November 2022, cholecystectomy at age 44, and gastric bypass surgery presents to the clinic complaining of right upper quadrant abdominal pain x2 weeks. Patient reports her symptoms have been constant and worsening since Friday. The pain radiates to her back and is worse with deep inspiration and worse after eating. Patient also admits to intermittent diarrhea x2 weeks, increase fatigue, low appetite and nausea. Patient denies any blood in the stool, urinary symptoms and vomiting. She has not tried any OTC medications and reports she stopped taking her Wegovy in November when she was getting surgery and she never restarted it. She has not been taking pepcid due to not having ongoing GERD symptoms.     Review of Systems  Constitutional:  Positive for appetite change and fatigue.  Respiratory:  Negative for chest tightness and shortness of breath.   Cardiovascular:  Negative for chest pain.  Gastrointestinal:  Positive for abdominal pain, diarrhea and nausea. Negative for abdominal distention, blood in stool, constipation and vomiting.  Genitourinary:  Negative for difficulty urinating, flank pain, frequency and hematuria.  Musculoskeletal:  Positive for back pain.  Skin: Negative.   Neurological:  Negative for dizziness and light-headedness.      Objective:   Physical Exam Constitutional:      General: She is not in acute distress.    Appearance: Normal appearance. She is obese. She is not ill-appearing, toxic-appearing or diaphoretic.  HENT:     Head: Normocephalic and atraumatic.  Eyes:     Extraocular Movements: Extraocular movements intact.     Conjunctiva/sclera: Conjunctivae normal.  Cardiovascular:     Rate and Rhythm: Normal rate and regular rhythm.     Heart sounds: Normal heart sounds.  Pulmonary:     Effort: Pulmonary effort is  normal.     Breath sounds: Normal breath sounds.  Abdominal:     General: Abdomen is flat. A surgical scar is present. Bowel sounds are normal. There is no distension.     Palpations: Abdomen is soft. There is no mass.     Tenderness: There is abdominal tenderness in the right upper quadrant. There is no right CVA tenderness, left CVA tenderness, guarding or rebound.     Hernia: No hernia is present.     Comments: 8 cm surgical scar post status umbilical hernia repair  Musculoskeletal:        General: Normal range of motion.     Cervical back: Normal range of motion and neck supple.  Skin:    General: Skin is warm and dry.  Neurological:     Mental Status: She is alert and oriented to person, place, and time.  Psychiatric:        Mood and Affect: Mood normal.        Behavior: Behavior normal.        Thought Content: Thought content normal.        Judgment: Judgment normal.          Assessment & Plan:  Marland KitchenMarland KitchenAloura was seen today for flank pain.  Diagnoses and all orders for this visit:  Right upper quadrant pain -     CBC w/Diff/Platelet -     COMPLETE METABOLIC PANEL WITH GFR -     Lipase -     H. pylori breath test -     alum &  mag hydroxide-simeth (MAALOX/MYLANTA) 200-200-20 MG/5ML suspension 30 mL -     hyoscyamine (ANASPAZ) disintergrating tablet 0.125 mg -     lidocaine (XYLOCAINE) 2 % viscous mouth solution 15 mL  No appetite -     CBC w/Diff/Platelet -     COMPLETE METABOLIC PANEL WITH GFR -     Lipase -     H. pylori breath test -     alum & mag hydroxide-simeth (MAALOX/MYLANTA) 200-200-20 MG/5ML suspension 30 mL -     hyoscyamine (ANASPAZ) disintergrating tablet 0.125 mg -     lidocaine (XYLOCAINE) 2 % viscous mouth solution 15 mL  Diarrhea, unspecified type -     CBC w/Diff/Platelet -     COMPLETE METABOLIC PANEL WITH GFR -     Lipase -     H. pylori breath test -     alum & mag hydroxide-simeth (MAALOX/MYLANTA) 200-200-20 MG/5ML suspension 30 mL -      hyoscyamine (ANASPAZ) disintergrating tablet 0.125 mg -     lidocaine (XYLOCAINE) 2 % viscous mouth solution 15 mL  Nausea -     CBC w/Diff/Platelet -     COMPLETE METABOLIC PANEL WITH GFR -     Lipase -     H. pylori breath test -     alum & mag hydroxide-simeth (MAALOX/MYLANTA) 200-200-20 MG/5ML suspension 30 mL -     hyoscyamine (ANASPAZ) disintergrating tablet 0.125 mg -     lidocaine (XYLOCAINE) 2 % viscous mouth solution 15 mL  Give GI cocktail for immediate symptom relief. Obtain labs to rule out pancreatitis, pud, liver abnormalities among others. Consider a CT exam if labs do not show obvious diagnosis. Last CT was 03/2021. Will treat for gastritis with unclear trigger. Educated patient to start taking pepcid 2x/day and continue to monitor symptoms. Keep GERD diet.

## 2021-08-26 NOTE — Patient Instructions (Addendum)
Pepcid twice a day GI cocktail today  Gastritis, Adult Gastritis is irritation and swelling (inflammation) of the stomach. There are two kinds of gastritis: Acute gastritis. This kind develops quickly. Chronic gastritis. This kind is much more common. It develops slowly and lasts for a long time. It is important to get help for this condition. If you do not get help, your stomach can bleed, and you can get sores (ulcers) in your stomach. What are the causes? This condition may be caused by: Germs that get to your stomach and cause an infection. Drinking too much alcohol. Medicines you are taking. Having too much acid in the stomach. Having a disease of the stomach. Other causes may include: An allergic reaction. Some cancer treatments (radiation). Smoking cigarettes or using products that contain nicotine or tobacco. In some cases, the cause of this condition is not known. What increases the risk? Having a disease of the intestines. Having Crohn's disease. Using aspirin or ibuprofen and other NSAIDs to treat other conditions. Stress. What are the signs or symptoms? Pain in your stomach. A burning feeling in your stomach. Feeling like you may vomit (nauseous). Vomiting or vomiting blood. Feeling too full after you eat. Weight loss. Bad breath. Blood in your poop (stool). In some cases, there are no symptoms. How is this treated? This condition is treated with medicines. The medicines that are used depend on what caused the condition. You may be given: Antibiotic medicine, if your condition was caused by an infection from germs. H2 blockers and similar medicines, if your condition was caused by too much acid in the stomach. Treatment may also include stopping the use of certain medicines, such as aspirin or ibuprofen. Follow these instructions at home: Medicines Take over-the-counter and prescription medicines only as told by your doctor. If you were prescribed an antibiotic  medicine, take it as told by your doctor. Do not stop taking it even if you start to feel better. Alcohol use Do not drink alcohol if: Your doctor tells you not to drink. You are pregnant, may be pregnant, or are planning to become pregnant. If you drink alcohol: Limit your use to: 0-1 drink a day for women. 0-2 drinks a day for men. Know how much alcohol is in your drink. In the [place] one drink equals one 12 oz bottle of beer (355 mL), one 5 oz glass of wine (148 mL), or one 1 oz glass of hard liquor (44 mL). General instructions  Eat small meals often, instead of large meals. Avoid foods and drinks that make you feel worse. Drink enough fluid to keep your pee (urine) pale yellow. Talk with your doctor about ways to manage stress. You can exercise or do deep breathing, meditation, or yoga. Do not smoke or use any products that contain nicotine or tobacco. If you need help quitting, ask your doctor. Keep all follow-up visits. Contact a doctor if: Your symptoms get worse. Your stomach pain gets worse. Your symptoms go away and then come back. You have a fever. Get help right away if: You vomit blood or something that looks like coffee grounds. You have black or dark red poop. You throw up any time you try to drink fluids. These symptoms may be an emergency. Get help right away. Call your local emergency services (911 in the [place] Do not wait to see if the symptoms will go away. Do not drive yourself to the hospital. Summary Gastritis is irritation and swelling (inflammation) of the stomach. You must  get help for this condition. If you do not get help, your stomach can bleed, and you can get sores (ulcers) in your stomach. You can be treated with medicines for germs or medicines to block too much acid in your stomach. This information is not intended to replace advice given to you by your health care provider. Make sure you discuss any questions you have with your health care  provider. Document Revised: 12/01/2020 Document Reviewed: 12/01/2020 Elsevier Patient Education  [place].

## 2021-08-27 ENCOUNTER — Other Ambulatory Visit: Payer: Self-pay | Admitting: Neurology

## 2021-08-27 DIAGNOSIS — R1011 Right upper quadrant pain: Secondary | ICD-10-CM

## 2021-08-27 DIAGNOSIS — R748 Abnormal levels of other serum enzymes: Secondary | ICD-10-CM

## 2021-08-27 NOTE — Progress Notes (Signed)
Laura [name],   Hemoglobin normal.  WBC normal.  Lipase your pancreatic enzyme is a little elevated indicating some mild pancreatic inflammation.  Keep a bland diet. Drink plenty of fluids. Recheck lipase on Friday.  Liver and kidney look good.  Protein a little low but could be due to not wanting to eat.

## 2021-08-28 LAB — CBC WITH DIFFERENTIAL/PLATELET
Absolute Monocytes: 325 cells/uL (ref 200–950)
Basophils Absolute: 23 cells/uL (ref 0–200)
Basophils Relative: 0.4 %
Eosinophils Absolute: 40 cells/uL (ref 15–500)
Eosinophils Relative: 0.7 %
HCT: 43 % (ref 35.0–45.0)
Hemoglobin: 14.6 g/dL (ref 11.7–15.5)
Lymphs Abs: 1875 cells/uL (ref 850–3900)
MCH: 32 pg (ref 27.0–33.0)
MCHC: 34 g/dL (ref 32.0–36.0)
MCV: 94.3 fL (ref 80.0–100.0)
MPV: 9.8 fL (ref 7.5–12.5)
Monocytes Relative: 5.7 %
Neutro Abs: 3437 cells/uL (ref 1500–7800)
Neutrophils Relative %: 60.3 %
Platelets: 197 10*3/uL (ref 140–400)
RBC: 4.56 10*6/uL (ref 3.80–5.10)
RDW: 11.8 % (ref 11.0–15.0)
Total Lymphocyte: 32.9 %
WBC: 5.7 10*3/uL (ref 3.8–10.8)

## 2021-08-28 LAB — COMPLETE METABOLIC PANEL WITH GFR
AG Ratio: 1.7 (calc) (ref 1.0–2.5)
ALT: 26 U/L (ref 6–29)
AST: 22 U/L (ref 10–35)
Albumin: 3.8 g/dL (ref 3.6–5.1)
Alkaline phosphatase (APISO): 96 U/L (ref 37–153)
BUN: 11 mg/dL (ref 7–25)
CO2: 29 mmol/L (ref 20–32)
Calcium: 8.8 mg/dL (ref 8.6–10.4)
Chloride: 113 mmol/L — ABNORMAL HIGH (ref 98–110)
Creat: 0.75 mg/dL (ref 0.50–1.03)
Globulin: 2.2 g/dL (calc) (ref 1.9–3.7)
Glucose, Bld: 75 mg/dL (ref 65–99)
Potassium: 4.9 mmol/L (ref 3.5–5.3)
Sodium: 143 mmol/L (ref 135–146)
Total Bilirubin: 0.5 mg/dL (ref 0.2–1.2)
Total Protein: 6 g/dL — ABNORMAL LOW (ref 6.1–8.1)
eGFR: 96 mL/min/{1.73_m2} (ref >=60)

## 2021-08-28 LAB — LIPASE: Lipase: 68 U/L — ABNORMAL HIGH (ref 7–60)

## 2021-08-28 LAB — H. PYLORI BREATH TEST: H. pylori Breath Test: NOT DETECTED

## 2021-08-28 NOTE — Progress Notes (Signed)
H pylori negative

## 2021-08-31 LAB — LIPASE: Lipase: 64 U/L — ABNORMAL HIGH (ref 7–60)

## 2021-09-02 ENCOUNTER — Encounter: Payer: Self-pay | Admitting: Physician Assistant

## 2021-09-02 DIAGNOSIS — R748 Abnormal levels of other serum enzymes: Secondary | ICD-10-CM

## 2021-09-02 DIAGNOSIS — R1011 Right upper quadrant pain: Secondary | ICD-10-CM

## 2021-09-02 DIAGNOSIS — K2100 Gastro-esophageal reflux disease with esophagitis, without bleeding: Secondary | ICD-10-CM

## 2021-09-02 DIAGNOSIS — R630 Anorexia: Secondary | ICD-10-CM

## 2021-09-02 NOTE — Progress Notes (Signed)
See my chart message

## 2021-09-20 ENCOUNTER — Other Ambulatory Visit: Payer: Self-pay | Admitting: Family Medicine

## 2021-09-20 DIAGNOSIS — J0110 Acute frontal sinusitis, unspecified: Secondary | ICD-10-CM

## 2021-09-30 ENCOUNTER — Other Ambulatory Visit: Payer: Self-pay | Admitting: Physician Assistant

## 2021-09-30 DIAGNOSIS — U071 COVID-19: Secondary | ICD-10-CM

## 2021-10-14 ENCOUNTER — Ambulatory Visit: Payer: Managed Care, Other (non HMO) | Admitting: Family Medicine

## 2021-10-14 NOTE — Progress Notes (Deleted)
? ?Established Patient Office Visit ? ?Subjective:  ?Patient ID: Laura [name], female    DOB: Oct 16, 1970  Age: 51 y.o. MRN: [id number] ? ?CC: No chief complaint on file. ? ? ?HPI ?[name] presents for  ? ?Follow-up migraines- ? ?Mood- ? ?Past Medical History:  ?Diagnosis Date  ? Allergic rhinitis   ? Arthritis   ? Depression   ? Dyslipidemia   ? Dysmenorrhea   ? Family history of adverse reaction to anesthesia   ? mother slow to wake up  ? Gall bladder disease   ? GERD (gastroesophageal reflux disease)   ? Headache   ? History of kidney stones   ? Infertility, female   ? Obesity   ? Pneumonia   ? Polycystic ovary disease   ? PONV (postoperative nausea and vomiting)   ? Sebaceous hyperplasia   ?  on tretinoin  ? Sleep apnea   ? cpap  ? WPW (Wolff-Parkinson-White syndrome)   ? hx of ablation 2012  ? ? ?Past Surgical History:  ?Procedure Laterality Date  ? CESAREAN SECTION    ? x 2  ? gastric bypass surgery     ? INCISIONAL HERNIA REPAIR N/A 07/01/2021  ? Procedure: INCISIONAL HERNIA REPAIR WITH MESH;  Surgeon: Kinsinger, Arta Bruce, MD;  Location: [facility];  Service: General;  Laterality: N/A;  ? LAPAROSCOPIC CHOLECYSTECTOMY    ? left foot surgery     ? SLEEVE GASTROPLASTY    ? SUPRAVENTRICULAR TACHYCARDIA ABLATION N/A 07/23/2011  ? Procedure: SUPRAVENTRICULAR TACHYCARDIA ABLATION;  Surgeon: Evans Lance, MD;  Location: [facility] CATH LAB;  Service: Cardiovascular;  Laterality: N/A;  ? transthoracic echcardiogram  09/15/2006  ? ? ?Family History  ?Problem Relation Age of Onset  ? Colon cancer Mother 66  ? Breast cancer Mother   ? Diabetes Mother   ? Heart attack Mother   ?     AMI, in late 50's  ? Depression Mother   ? Heart disease Mother   ? Hypertension Father   ? Thyroid disease Brother   ? Thyroid disease Sister   ? ? ?Social History  ? ?Socioeconomic History  ? Marital status: Married  ?  Spouse name: matthew  ? Number of children: 5  ? Years of education: Not on file  ? Highest education level: Not on file   ?Occupational History  ? Occupation: Equities trader  ?  Employer: [employer]  ?Tobacco Use  ? Smoking status: Former  ?  Types: Cigarettes  ?  Quit date: 07/15/2002  ?  Years since quitting: 19.2  ? Smokeless tobacco: Never  ?Vaping Use  ? Vaping Use: Never used  ?Substance and Sexual Activity  ? Alcohol use: Never  ? Drug use: Never  ? Sexual activity: Yes  ?  Partners: Male  ?Other Topics Concern  ? Not on file  ?Social History Narrative  ? Some exercise.  In bariatric program at [facility].   ? ?Social Determinants of Health  ? ?Financial Resource Strain: Not on file  ?Food Insecurity: Not on file  ?Transportation Needs: Not on file  ?Physical Activity: Not on file  ?Stress: Not on file  ?Social Connections: Not on file  ?Intimate Partner Violence: Not on file  ? ? ?Outpatient Medications Prior to Visit  ?Medication Sig Dispense Refill  ? acetaminophen (TYLENOL) 500 MG tablet Take 1,000 mg by mouth every 8 (eight) hours as needed for moderate pain.    ? BIOTIN 5000 PO Take 5,000 mcg by mouth  daily.    ? Calcium Carbonate (CALCIUM 600 PO) Take 600 mg by mouth daily.    ? Cholecalciferol 50 MCG (2000 UT) TABS Take 2,000 Units by mouth daily.    ? clobetasol ointment (TEMOVATE) 7.09 % Apply 1 application topically 2 (two) times daily as needed (eczema).    ? ergocalciferol (VITAMIN D2) 1.25 MG (50000 UT) capsule Take 1 capsule by mouth every Monday, Wednesday, and Friday.    ? escitalopram (LEXAPRO) 10 MG tablet Take 1 tablet (10 mg total) by mouth daily. 90 tablet 1  ? famotidine (PEPCID) 20 MG tablet Take 20 mg by mouth at bedtime as needed for heartburn or indigestion.    ? fluticasone (FLONASE) 50 MCG/ACT nasal spray Place 2 sprays into both nostrils daily. (Patient taking differently: Place 2 sprays into both nostrils daily as needed for allergies.) 16 g 6  ? ipratropium (ATROVENT) 0.06 % nasal spray INSTILL 2 SPRAYS INTO BOTH NOSTRILS FOUR TIMES A DAY 45 mL 1  ? omeprazole (PRILOSEC) 40 MG capsule Take  40 mg by mouth in the morning and at bedtime.    ? ondansetron (ZOFRAN ODT) 4 MG disintegrating tablet Take 1 tablet (4 mg total) by mouth every 8 (eight) hours as needed for nausea or vomiting. 10 tablet 0  ? oxyCODONE (OXY IR/ROXICODONE) 5 MG immediate release tablet Take 1 tablet (5 mg total) by mouth every 6 (six) hours as needed for severe pain. 30 tablet 0  ? Prenatal Vit-Fe Fumarate-FA (PRENATAL VITAMIN PO) Take 1 tablet by mouth daily.    ? rizatriptan (MAXALT-MLT) 10 MG disintegrating tablet Take 10 mg by mouth as needed for migraine. May repeat in 2 hours if needed    ? topiramate (TOPAMAX) 50 MG tablet Take 50 mg by mouth 2 (two) times daily.    ? WEGOVY 2.4 MG/0.75ML SOAJ Inject 2.4 mg into the skin every Saturday.    ? ?No facility-administered medications prior to visit.  ? ? ?Allergies  ?Allergen Reactions  ? Morphine Itching  ? Chromium Other (See Comments)  ?  causes contact dermatitis  ? Neomycin   ?  Blisters on hands  ? Penicillins Hives  ? ? ?ROS ?Review of Systems ? ?  ?Objective:  ?  ?Physical Exam ? ?LMP 02/12/2019  ?Wt Readings from Last 3 Encounters:  ?08/26/21 190 lb 1.9 oz (86.2 kg)  ?07/01/21 194 lb (88 kg)  ?06/19/21 194 lb (88 kg)  ? ? ? ?Health Maintenance Due  ?Topic Date Due  ? HIV Screening  Never done  ? Hepatitis C Screening  Never done  ? ? ?There are no preventive care reminders to display for this patient. ? ?Lab Results  ?Component Value Date  ? TSH 2.676 05/18/2012  ? ?Lab Results  ?Component Value Date  ? WBC 5.7 08/26/2021  ? HGB 14.6 08/26/2021  ? HCT 43.0 08/26/2021  ? MCV 94.3 08/26/2021  ? PLT 197 08/26/2021  ? ?Lab Results  ?Component Value Date  ? NA 143 08/26/2021  ? K 4.9 08/26/2021  ? CO2 29 08/26/2021  ? GLUCOSE 75 08/26/2021  ? BUN 11 08/26/2021  ? CREATININE 0.75 08/26/2021  ? BILITOT 0.5 08/26/2021  ? ALKPHOS 71 05/18/2012  ? AST 22 08/26/2021  ? ALT 26 08/26/2021  ? PROT 6.0 (L) 08/26/2021  ? ALBUMIN 3.5 05/18/2012  ? CALCIUM 8.8 08/26/2021  ? ANIONGAP 5  07/02/2021  ? EGFR 96 08/26/2021  ? GFR 86.35 07/18/2011  ? ?Lab Results  ?Component Value  Date  ? CHOL 176 04/16/2021  ? ?Lab Results  ?Component Value Date  ? HDL 49 (L) 04/16/2021  ? ?Lab Results  ?Component Value Date  ? LDLCALC 106 (H) 04/16/2021  ? ?Lab Results  ?Component Value Date  ? TRIG 110 04/16/2021  ? ?Lab Results  ?Component Value Date  ? CHOLHDL 3.6 04/16/2021  ? ?Lab Results  ?Component Value Date  ? HGBA1C 5.0 09/05/2019  ? ? ?  ?Assessment & Plan:  ? ?Problem List Items Addressed This Visit   ? ?  ? Cardiovascular and Mediastinum  ? Migraine - Primary  ?  ? Other  ? Situational depression  ? ? ?No orders of the defined types were placed in this encounter. ? ? ?Follow-up: No follow-ups on file.  ? ? ?Beatrice Lecher, MD ?

## 2021-10-29 ENCOUNTER — Other Ambulatory Visit: Payer: Self-pay

## 2021-10-29 ENCOUNTER — Ambulatory Visit (INDEPENDENT_AMBULATORY_CARE_PROVIDER_SITE_OTHER): Payer: Managed Care, Other (non HMO) | Admitting: Family Medicine

## 2021-10-29 ENCOUNTER — Encounter: Payer: Self-pay | Admitting: Family Medicine

## 2021-10-29 VITALS — BP 93/43 | HR 57 | Ht 64.0 in | Wt 189.0 lb

## 2021-10-29 DIAGNOSIS — F4321 Adjustment disorder with depressed mood: Secondary | ICD-10-CM | POA: Diagnosis not present

## 2021-10-29 DIAGNOSIS — F330 Major depressive disorder, recurrent, mild: Secondary | ICD-10-CM

## 2021-10-29 DIAGNOSIS — J0110 Acute frontal sinusitis, unspecified: Secondary | ICD-10-CM

## 2021-10-29 DIAGNOSIS — G4700 Insomnia, unspecified: Secondary | ICD-10-CM

## 2021-10-29 MED ORDER — LORAZEPAM 0.5 MG PO TABS: 0.5000 mg | ORAL_TABLET | ORAL | 1 refills | Status: DC | PRN

## 2021-10-29 MED ORDER — ESCITALOPRAM OXALATE 10 MG PO TABS: 10.0000 mg | ORAL_TABLET | Freq: Every day | ORAL | 1 refills | Status: DC

## 2021-10-29 MED ORDER — IPRATROPIUM BROMIDE 0.06 % NA SOLN: NASAL | 1 refills | Status: DC

## 2021-10-29 MED ORDER — TOPIRAMATE 50 MG PO TABS: 50.0000 mg | ORAL_TABLET | Freq: Two times a day (BID) | ORAL | 1 refills | Status: DC

## 2021-10-29 NOTE — Assessment & Plan Note (Addendum)
Same as above.  Continue with therapy appointments as well. ?

## 2021-10-29 NOTE — Progress Notes (Signed)
? ?Established Patient Office Visit ? ?Subjective:  ?Patient ID: Laura [name], female    DOB: 06-Jan-1971  Age: 51 y.o. MRN: [id number] ? ?CC:  ?Chief Complaint  ?Patient presents with  ? Follow-up  ? ? ?HPI ?[name] presents for  ? ?F/U MDD -currently on Lexapro 10 mg daily.  She feels like she is doing really well on her current regimen and does not want to make any adjustments her biggest issue lately is sleep she said she really been working out and had gotten better until about 2 weeks ago and now it is off again sometimes she is not falling asleep until 4 AM.  Most of her difficulty is really with falling asleep. ? ?She would also like to have a short supply of Xanax to take before her dental procedure.she has a tooth that needs to be extracted.   ? ?He also follows at [facility] for weight management and is currently on Wegovy she actually feels like she is doing really well on it.  BMI is 32 today. ? ?Past Medical History:  ?Diagnosis Date  ? Allergic rhinitis   ? Arthritis   ? Depression   ? Dyslipidemia   ? Dysmenorrhea   ? Family history of adverse reaction to anesthesia   ? mother slow to wake up  ? Gall bladder disease   ? GERD (gastroesophageal reflux disease)   ? Headache   ? History of kidney stones   ? Infertility, female   ? Obesity   ? Pneumonia   ? Polycystic ovary disease   ? PONV (postoperative nausea and vomiting)   ? Sebaceous hyperplasia   ?  on tretinoin  ? Sleep apnea   ? cpap  ? WPW (Wolff-Parkinson-White syndrome)   ? hx of ablation 2012  ? ? ?Past Surgical History:  ?Procedure Laterality Date  ? CESAREAN SECTION    ? x 2  ? gastric bypass surgery     ? INCISIONAL HERNIA REPAIR N/A 07/01/2021  ? Procedure: INCISIONAL HERNIA REPAIR WITH MESH;  Surgeon: Kinsinger, Arta Bruce, MD;  Location: [facility];  Service: General;  Laterality: N/A;  ? LAPAROSCOPIC CHOLECYSTECTOMY    ? left foot surgery     ? SLEEVE GASTROPLASTY    ? SUPRAVENTRICULAR TACHYCARDIA ABLATION N/A 07/23/2011  ?  Procedure: SUPRAVENTRICULAR TACHYCARDIA ABLATION;  Surgeon: Evans Lance, MD;  Location[facility] CATH LAB;  Service: Cardiovascular;  Laterality: N/A;  ? transthoracic echcardiogram  09/15/2006  ? ? ?Family History  ?Problem Relation Age of Onset  ? Colon cancer Mother 70  ? Breast cancer Mother   ? Diabetes Mother   ? Heart attack Mother   ?     AMI, in late 50's  ? Depression Mother   ? Heart disease Mother   ? Hypertension Father   ? Thyroid disease Brother   ? Thyroid disease Sister   ? ? ?Social History  ? ?Socioeconomic History  ? Marital status: Married  ?  Spouse name: matthew  ? Number of children: 5  ? Years of education: Not on file  ? Highest education level: Not on file  ?Occupational History  ? Occupation: Equities trader  ?  Employer: [employer]  ?Tobacco Use  ? Smoking status: Former  ?  Types: Cigarettes  ?  Quit date: 07/15/2002  ?  Years since quitting: 19.3  ? Smokeless tobacco: Never  ?Vaping Use  ? Vaping Use: Never used  ?Substance and Sexual Activity  ? Alcohol  use: Never  ? Drug use: Never  ? Sexual activity: Yes  ?  Partners: Male  ?Other Topics Concern  ? Not on file  ?Social History Narrative  ? Some exercise.  In bariatric program at [facility].   ? ?Social Determinants of Health  ? ?Financial Resource Strain: Not on file  ?Food Insecurity: Not on file  ?Transportation Needs: Not on file  ?Physical Activity: Not on file  ?Stress: Not on file  ?Social Connections: Not on file  ?Intimate Partner Violence: Not on file  ? ? ?Outpatient Medications Prior to Visit  ?Medication Sig Dispense Refill  ? acetaminophen (TYLENOL) 500 MG tablet Take 1,000 mg by mouth every 8 (eight) hours as needed for moderate pain.    ? BIOTIN 5000 PO Take 5,000 mcg by mouth daily.    ? Calcium Carbonate (CALCIUM 600 PO) Take 600 mg by mouth daily.    ? Cholecalciferol 50 MCG (2000 UT) TABS Take 2,000 Units by mouth daily.    ? clobetasol ointment (TEMOVATE) 7.12 % Apply 1 application topically 2 (two) times daily  as needed (eczema).    ? dicyclomine (BENTYL) 20 MG tablet Take 20 mg by mouth 3 (three) times daily.  2  ? ergocalciferol (VITAMIN D2) 1.25 MG (50000 UT) capsule Take 1 capsule by mouth every Monday, Wednesday, and Friday.    ? famotidine (PEPCID) 20 MG tablet Take 20 mg by mouth at bedtime as needed for heartburn or indigestion.    ? fluticasone (FLONASE) 50 MCG/ACT nasal spray Place 2 sprays into both nostrils daily. (Patient taking differently: Place 2 sprays into both nostrils daily as needed for allergies.) 16 g 6  ? omeprazole (PRILOSEC) 40 MG capsule Take 40 mg by mouth in the morning and at bedtime.    ? ondansetron (ZOFRAN ODT) 4 MG disintegrating tablet Take 1 tablet (4 mg total) by mouth every 8 (eight) hours as needed for nausea or vomiting. 10 tablet 0  ? Prenatal Vit-Fe Fumarate-FA (PRENATAL VITAMIN PO) Take 1 tablet by mouth daily.    ? rizatriptan (MAXALT-MLT) 10 MG disintegrating tablet Take 10 mg by mouth as needed for migraine. May repeat in 2 hours if needed    ? WEGOVY 2.4 MG/0.75ML SOAJ Inject 2.4 mg into the skin every Saturday.    ? escitalopram (LEXAPRO) 10 MG tablet Take 1 tablet (10 mg total) by mouth daily. 90 tablet 1  ? ipratropium (ATROVENT) 0.06 % nasal spray INSTILL 2 SPRAYS INTO BOTH NOSTRILS FOUR TIMES A DAY 45 mL 1  ? topiramate (TOPAMAX) 50 MG tablet Take 50 mg by mouth 2 (two) times daily.    ? oxyCODONE (OXY IR/ROXICODONE) 5 MG immediate release tablet Take 1 tablet (5 mg total) by mouth every 6 (six) hours as needed for severe pain. 30 tablet 0  ? ?No facility-administered medications prior to visit.  ? ? ?Allergies  ?Allergen Reactions  ? Morphine Itching  ? Chromium Other (See Comments)  ?  causes contact dermatitis  ? Neomycin   ?  Blisters on hands  ? Penicillins Hives  ? ? ?ROS ?Review of Systems ? ?  ?Objective:  ?  ?Physical Exam ?Constitutional:   ?   Appearance: Normal appearance. She is well-developed.  ?HENT:  ?   Head: Normocephalic and atraumatic.   ?Cardiovascular:  ?   Rate and Rhythm: Normal rate and regular rhythm.  ?   Heart sounds: Normal heart sounds.  ?Pulmonary:  ?   Effort: Pulmonary effort is normal.  ?  Breath sounds: Normal breath sounds.  ?Skin: ?   General: Skin is warm and dry.  ?Neurological:  ?   Mental Status: She is alert and oriented to person, place, and time.  ?Psychiatric:     ?   Behavior: Behavior normal.  ? ? ?BP (!) 93/43   Pulse (!) 57   Ht 5' 4" (1.626 m)   Wt 189 lb (85.7 kg)   LMP 02/12/2019   SpO2 99%   BMI 32.44 kg/m?  ?Wt Readings from Last 3 Encounters:  ?10/29/21 189 lb (85.7 kg)  ?08/26/21 190 lb 1.9 oz (86.2 kg)  ?07/01/21 194 lb (88 kg)  ? ? ? ?Health Maintenance Due  ?Topic Date Due  ? HIV Screening  Never done  ? Hepatitis C Screening  Never done  ? ? ?There are no preventive care reminders to display for this patient. ? ?Lab Results  ?Component Value Date  ? TSH 2.676 05/18/2012  ? ?Lab Results  ?Component Value Date  ? WBC 5.7 08/26/2021  ? HGB 14.6 08/26/2021  ? HCT 43.0 08/26/2021  ? MCV 94.3 08/26/2021  ? PLT 197 08/26/2021  ? ?Lab Results  ?Component Value Date  ? NA 143 08/26/2021  ? K 4.9 08/26/2021  ? CO2 29 08/26/2021  ? GLUCOSE 75 08/26/2021  ? BUN 11 08/26/2021  ? CREATININE 0.75 08/26/2021  ? BILITOT 0.5 08/26/2021  ? ALKPHOS 71 05/18/2012  ? AST 22 08/26/2021  ? ALT 26 08/26/2021  ? PROT 6.0 (L) 08/26/2021  ? ALBUMIN 3.5 05/18/2012  ? CALCIUM 8.8 08/26/2021  ? ANIONGAP 5 07/02/2021  ? EGFR 96 08/26/2021  ? GFR 86.35 07/18/2011  ? ?Lab Results  ?Component Value Date  ? CHOL 176 04/16/2021  ? ?Lab Results  ?Component Value Date  ? HDL 49 (L) 04/16/2021  ? ?Lab Results  ?Component Value Date  ? LDLCALC 106 (H) 04/16/2021  ? ?Lab Results  ?Component Value Date  ? TRIG 110 04/16/2021  ? ?Lab Results  ?Component Value Date  ? CHOLHDL 3.6 04/16/2021  ? ?Lab Results  ?Component Value Date  ? HGBA1C 5.0 09/05/2019  ? ? ?  ?Assessment & Plan:  ? ?Problem List Items Addressed This Visit   ? ?  ? Other  ?  Situational depression  ?  Struggling with sleep but o/w doing well with mood.  Lexapro refilled today.  ?  ?  ? Relevant Medications  ? escitalopram (LEXAPRO) 10 MG tablet  ? LORazepam (ATIVAN) 0.5 MG tablet  ? Major depressive disor

## 2021-10-29 NOTE — Assessment & Plan Note (Signed)
Struggling with sleep but o/w doing well with mood.  Lexapro refilled today.  ?

## 2021-10-29 NOTE — Patient Instructions (Addendum)
Recommend a trial of 25 mg of Benadryl about an hour before bedtime just to see if this helps with sleep quality.  Did give her a short prescription of Ativan to use before her dental procedure.  She is can have a tooth extracted and probably an implant put in.  She is actually knocked out her front 2 teeth on the monkey bars when she was younger.  Prescription sent for Ativan to take as needed before procedure. ?

## 2021-10-30 NOTE — Assessment & Plan Note (Signed)
Recommend a trial of 25 mg of Benadryl about an hour before bedtime just to see if this helps with sleep quality.  Did give her a short prescription of Ativan to use before her dental procedure.  She is can have a tooth extracted and probably an implant put in.  She is actually knocked out her front 2 teeth on the monkey bars when she was younger.  Prescription sent for Ativan to take as needed before procedure. ?

## 2022-01-26 ENCOUNTER — Other Ambulatory Visit: Payer: Self-pay | Admitting: Family Medicine

## 2022-01-26 DIAGNOSIS — G43809 Other migraine, not intractable, without status migrainosus: Secondary | ICD-10-CM

## 2022-04-28 ENCOUNTER — Encounter: Payer: Self-pay | Admitting: Family Medicine

## 2022-04-28 ENCOUNTER — Ambulatory Visit (INDEPENDENT_AMBULATORY_CARE_PROVIDER_SITE_OTHER): Payer: Managed Care, Other (non HMO) | Admitting: Family Medicine

## 2022-04-28 VITALS — BP 108/45 | HR 90 | Wt 192.0 lb

## 2022-04-28 DIAGNOSIS — Z111 Encounter for screening for respiratory tuberculosis: Secondary | ICD-10-CM | POA: Diagnosis not present

## 2022-04-28 DIAGNOSIS — F330 Major depressive disorder, recurrent, mild: Secondary | ICD-10-CM

## 2022-04-28 DIAGNOSIS — M7062 Trochanteric bursitis, left hip: Secondary | ICD-10-CM

## 2022-04-28 DIAGNOSIS — R140 Abdominal distension (gaseous): Secondary | ICD-10-CM | POA: Diagnosis not present

## 2022-04-28 DIAGNOSIS — M7061 Trochanteric bursitis, right hip: Secondary | ICD-10-CM | POA: Diagnosis not present

## 2022-04-28 DIAGNOSIS — Z23 Encounter for immunization: Secondary | ICD-10-CM | POA: Diagnosis not present

## 2022-04-28 DIAGNOSIS — G43809 Other migraine, not intractable, without status migrainosus: Secondary | ICD-10-CM

## 2022-04-28 NOTE — Progress Notes (Signed)
Established Patient Office Visit  Subjective   Patient ID: Anthonella Klausner, female    DOB: 1971-06-24  Age: 51 y.o. MRN: [id number]  Chief Complaint  Patient presents with   Hypertension    HPI  F/U MDD -she feels like overall she is doing well on her current regimen she is currently on Lexapro 10 mg daily.  She does take lorazepam as needed.  Follows with [facility] for weight management  -he is currently on Wegovy and doing really well.BMI is now down to 32.    See GI recently for some bloating and persistent right upper quadrant pain and they had recommended low food map diet for her.  Felt very dissatisfied with her visit because she said the provider only came in the room for a couple minutes and then left.  History of bariatric surgery.  Would like to have her thyroid checked.  Did have her ventral hernia repaired and says its actually been really wonderful she feels like it significantly improved her quality of life she has been able to bend twist sit up in bed without difficulty and its made a big difference.  Reports bilateral hip pain mostly on the outsides of her hips occasionally will radiate down towards the lateral knee and occasionally will radiate towards the groin.  Her right is definitely worse than her left.  Symptoms have been present for just a little over a month.  She actually has been trying to work out and go to the gym and has been trying to do some hip stretches.  Also needs a PPD placed for work.    ROS    Objective:     BP (!) 108/45   Pulse 90   Wt 192 lb (87.1 kg)   LMP 02/12/2019   SpO2 98%   BMI 32.96 kg/m    Physical Exam Vitals and nursing note reviewed.  Constitutional:      Appearance: She is well-developed.  HENT:     Head: Normocephalic and atraumatic.  Cardiovascular:     Rate and Rhythm: Normal rate and regular rhythm.     Heart sounds: Normal heart sounds.  Pulmonary:     Effort: Pulmonary effort is normal.     Breath  sounds: Normal breath sounds.  Skin:    General: Skin is warm and dry.  Neurological:     Mental Status: She is alert and oriented to person, place, and time.  Psychiatric:        Behavior: Behavior normal.      No results found for any visits on 04/28/22.    The 10-year ASCVD risk score (Arnett DK, et al., 2019) is: 1%    Assessment & Plan:   Problem List Items Addressed This Visit       Cardiovascular and Mediastinum   Migraine    Well.  Currently on Topamax 100 mg daily uses Maxalt for rescue.  She feels like overall her headaches are fairly well controlled and the Maxalt is effective when she uses it.        Musculoskeletal and Integument   Trochanteric bursitis of both hips    States she has had hip bursitis previously and saw Dr. Soundra Pilon probably about 5 years ago.  Its been flaring again for about the last month.  We discussed doing the exercises on her own at home over the next 3 to 4 weeks and if not improving then she can return for injections.  Other   Major depressive disorder, recurrent episode ([place])    Stable on current regimen.        Other Visit Diagnoses     Screening-pulmonary TB    -  Primary   Relevant Orders   PPD (Completed)   Bloating          Bloating - she reports she is due for repeat colonoscopy at the end of the year but may consider changing GI docs after that   They did recommend a low FODMAP diet   Return in about 6 months (around 10/27/2022).    Beatrice Lecher, MD

## 2022-04-28 NOTE — Patient Instructions (Signed)
See if they can order a TSH and a lipid panel and CMP.   Thank you.

## 2022-04-28 NOTE — Assessment & Plan Note (Signed)
Stable on current regimen   

## 2022-04-28 NOTE — Assessment & Plan Note (Signed)
Well.  Currently on Topamax 100 mg daily uses Maxalt for rescue.  She feels like overall her headaches are fairly well controlled and the Maxalt is effective when she uses it.

## 2022-04-28 NOTE — Assessment & Plan Note (Signed)
States she has had hip bursitis previously and saw Dr. Soundra Pilon probably about 5 years ago.  Its been flaring again for about the last month.  We discussed doing the exercises on her own at home over the next 3 to 4 weeks and if not improving then she can return for injections.

## 2022-04-30 ENCOUNTER — Ambulatory Visit (INDEPENDENT_AMBULATORY_CARE_PROVIDER_SITE_OTHER): Payer: Managed Care, Other (non HMO) | Admitting: Physician Assistant

## 2022-04-30 DIAGNOSIS — Z111 Encounter for screening for respiratory tuberculosis: Secondary | ICD-10-CM

## 2022-04-30 DIAGNOSIS — R5383 Other fatigue: Secondary | ICD-10-CM | POA: Diagnosis not present

## 2022-04-30 DIAGNOSIS — N926 Irregular menstruation, unspecified: Secondary | ICD-10-CM | POA: Insufficient documentation

## 2022-04-30 DIAGNOSIS — Z0000 Encounter for general adult medical examination without abnormal findings: Secondary | ICD-10-CM | POA: Diagnosis not present

## 2022-04-30 LAB — TB SKIN TEST
Induration: NEGATIVE mm
TB Skin Test: NEGATIVE

## 2022-04-30 NOTE — Progress Notes (Signed)
   Subjective:    Patient ID: Laura [name], female    DOB: 1971/07/18, 51 y.o.   MRN: [id number]  HPI  Patient here for PPD Read  Patient's employer contacted her today stating that she needs either a 2 step TB skin test or a QuantiFERON-TB Gold.   I placed the orders for the TB Gold. The patient is coming back in the morning to have labs done from her appointment with Dr. Madilyn Fireman on Monday 04/28/2022.   I placed lab orders for CMP, Lipid, TSH also.     Objective:   Physical Exam        Assessment & Plan:   Results were negative

## 2022-04-30 NOTE — Progress Notes (Signed)
Agree with above plan. 

## 2022-05-02 NOTE — Progress Notes (Signed)
Hi Laura [name], your protein levels are drifting down a little bit they were little lower last year at 6.0 now 5.5.  Really encouraged to work on increasing protein in your diet.  Your ALT liver enzyme is also mildly elevated.  It was up about a year ago but then came back down.  Just continue to work on healthy food choices and regular exercise and avoid excess Tylenol and alcohol lets recheck your liver function in 4-6 weeks.  LDL cholesterol is up just a little bit.  Continue work on [employer] and regular exercise.  Your thyroid is a little borderline, in the direction of hypothyroidism.  Right now your T4 is technically normal so would also like to recheck your TSH and T4 in about 4-6 weeks.

## 2022-05-05 ENCOUNTER — Encounter: Payer: Self-pay | Admitting: Family Medicine

## 2022-05-06 LAB — COMPLETE METABOLIC PANEL WITH GFR
AG Ratio: 1.9 (calc) (ref 1.0–2.5)
ALT: 50 U/L — ABNORMAL HIGH (ref 6–29)
AST: 31 U/L (ref 10–35)
Albumin: 3.6 g/dL (ref 3.6–5.1)
Alkaline phosphatase (APISO): 89 U/L (ref 37–153)
BUN: 13 mg/dL (ref 7–25)
CO2: 27 mmol/L (ref 20–32)
Calcium: 8.6 mg/dL (ref 8.6–10.4)
Chloride: 107 mmol/L (ref 98–110)
Creat: 0.83 mg/dL (ref 0.50–1.03)
Globulin: 1.9 g/dL (calc) (ref 1.9–3.7)
Glucose, Bld: 80 mg/dL (ref 65–99)
Potassium: 4.1 mmol/L (ref 3.5–5.3)
Sodium: 141 mmol/L (ref 135–146)
Total Bilirubin: 0.6 mg/dL (ref 0.2–1.2)
Total Protein: 5.5 g/dL — ABNORMAL LOW (ref 6.1–8.1)
eGFR: 85 mL/min/{1.73_m2} (ref >=60)

## 2022-05-06 LAB — QUANTIFERON-TB GOLD PLUS
Mitogen-NIL: >10 IU/mL
NIL: 0.06 IU/mL
QuantiFERON-TB Gold Plus: NEGATIVE
TB1-NIL: <0 IU/mL
TB2-NIL: <0 IU/mL

## 2022-05-06 LAB — TSH+FREE T4: TSH W/REFLEX TO FT4: 4.67 mIU/L — ABNORMAL HIGH

## 2022-05-06 LAB — LIPID PANEL
Cholesterol: 197 mg/dL (ref <200)
HDL: 63 mg/dL (ref >=50)
LDL Cholesterol (Calc): 115 mg/dL (calc) — ABNORMAL HIGH
Non-HDL Cholesterol (Calc): 134 mg/dL (calc) — ABNORMAL HIGH (ref <130)
Total CHOL/HDL Ratio: 3.1 (calc) (ref <5.0)
Triglycerides: 89 mg/dL (ref <150)

## 2022-05-06 LAB — T4, FREE: Free T4: 0.8 ng/dL (ref 0.8–1.8)

## 2022-05-07 NOTE — Progress Notes (Signed)
Hi Laura [name], your blood TB test was negative.

## 2022-05-17 ENCOUNTER — Other Ambulatory Visit: Payer: Self-pay | Admitting: Family Medicine

## 2022-05-17 DIAGNOSIS — F4321 Adjustment disorder with depressed mood: Secondary | ICD-10-CM

## 2022-05-17 DIAGNOSIS — F330 Major depressive disorder, recurrent, mild: Secondary | ICD-10-CM

## 2022-05-19 ENCOUNTER — Encounter: Payer: Self-pay | Admitting: Sports Medicine

## 2022-05-19 ENCOUNTER — Ambulatory Visit: Payer: Managed Care, Other (non HMO)

## 2022-05-19 ENCOUNTER — Ambulatory Visit (INDEPENDENT_AMBULATORY_CARE_PROVIDER_SITE_OTHER): Payer: Managed Care, Other (non HMO) | Admitting: Sports Medicine

## 2022-05-19 DIAGNOSIS — M25552 Pain in left hip: Secondary | ICD-10-CM

## 2022-05-19 DIAGNOSIS — M25551 Pain in right hip: Secondary | ICD-10-CM

## 2022-05-19 DIAGNOSIS — R1030 Lower abdominal pain, unspecified: Secondary | ICD-10-CM

## 2022-05-19 MED ORDER — HYDROCODONE-ACETAMINOPHEN 5-325 MG PO TABS: 1.0000 | ORAL_TABLET | Freq: Three times a day (TID) | ORAL | 0 refills | Status: DC | PRN

## 2022-05-19 NOTE — Progress Notes (Signed)
    Procedures performed today:    None.  Independent interpretation of notes and tests performed by another provider:   None.  Brief History, Exam, Impression, and Recommendations:    Bilateral hip pain This is a pleasant 51 year old female, she has a long history of bilateral hip pain, localized laterally but also into the groin, on exam she really does not have significant tenderness directly over the greater trochanters but she does have reproduction of pain with internal rotation of each hip, unable to use NSAIDs due to bariatric surgery. Adding hydrocodone, x-rays, formal physical therapy, return to see me in 6 weeks, bilateral hip joint injections if not better.    ____________________________________________ Gwen Her. Dianah Field, M.D., ABFM., CAQSM., AME. Primary Care and Sports Medicine [facility]  Adjunct Professor of [employer] [facility] [employer]  Risk manager

## 2022-05-19 NOTE — Assessment & Plan Note (Signed)
This is a pleasant 51 year old female, she has a long history of bilateral hip pain, localized laterally but also into the groin, on exam she really does not have significant tenderness directly over the greater trochanters but she does have reproduction of pain with internal rotation of each hip, unable to use NSAIDs due to bariatric surgery. Adding hydrocodone, x-rays, formal physical therapy, return to see me in 6 weeks, bilateral hip joint injections if not better.

## 2022-05-21 ENCOUNTER — Ambulatory Visit
Payer: Managed Care, Other (non HMO) | Attending: Sports Medicine | Admitting: Rehabilitative and Restorative Service Providers"

## 2022-05-21 ENCOUNTER — Other Ambulatory Visit: Payer: Self-pay

## 2022-05-21 DIAGNOSIS — M25551 Pain in right hip: Secondary | ICD-10-CM | POA: Insufficient documentation

## 2022-05-21 DIAGNOSIS — M25552 Pain in left hip: Secondary | ICD-10-CM | POA: Insufficient documentation

## 2022-05-21 DIAGNOSIS — M6281 Muscle weakness (generalized): Secondary | ICD-10-CM | POA: Insufficient documentation

## 2022-05-21 NOTE — Therapy (Signed)
OUTPATIENT PHYSICAL THERAPY LOWER EXTREMITY EVALUATION   Patient Name: Laura [name] MRN: [id number] DOB:04/14/1971, 51 y.o., female Today's Date: 05/21/2022   PT End of Session - 05/21/22 1214     Visit Number 1    Number of Visits 16    Date for PT Re-Evaluation 07/20/22    Authorization Type cigna    PT Start Time [phone]    PT Stop Time 0933    PT Time Calculation (min) 38 min    Activity Tolerance Patient tolerated treatment well    Behavior During Therapy WFL for tasks assessed/performed             Past Medical History:  Diagnosis Date   Allergic rhinitis    Arthritis    Depression    Dyslipidemia    Dysmenorrhea    Family history of adverse reaction to anesthesia    mother slow to wake up   Gall bladder disease    GERD (gastroesophageal reflux disease)    Headache    History of kidney stones    Infertility, female    Obesity    Pneumonia    Polycystic ovary disease    PONV (postoperative nausea and vomiting)    Sebaceous hyperplasia     on tretinoin   Sleep apnea    cpap   WPW (Wolff-Parkinson-White syndrome)    hx of ablation 2012   Past Surgical History:  Procedure Laterality Date   CESAREAN SECTION     x 2   gastric bypass surgery      INCISIONAL HERNIA REPAIR N/A 07/01/2021   Procedure: INCISIONAL HERNIA REPAIR WITH MESH;  Surgeon: Kinsinger, Arta Bruce, MD;  Location: [facility];  Service: General;  Laterality: N/A;   LAPAROSCOPIC CHOLECYSTECTOMY     left foot surgery      SLEEVE GASTROPLASTY     SUPRAVENTRICULAR TACHYCARDIA ABLATION N/A 07/23/2011   Procedure: SUPRAVENTRICULAR TACHYCARDIA ABLATION;  Surgeon: Evans Lance, MD;  Location: [facility] CATH LAB;  Service: Cardiovascular;  Laterality: N/A;   transthoracic echcardiogram  09/15/2006   Patient Active Problem List   Diagnosis Date Noted   Irregular periods 04/30/2022   Right upper quadrant pain 08/26/2021   Diarrhea 08/26/2021   Nausea 08/26/2021   Incisional hernia 07/01/2021   H/O  metal allergy 05/01/2020   Insomnia 05/01/2020   Iron deficiency 10/26/2019   Hx of gastric bypass 06/29/2019   Migraine 04/28/2019   Situational depression 04/28/2019   Family history of colon cancer 01/19/2019   Hx of adenomatous colonic polyps 01/19/2019   Pain and swelling of right knee 12/20/2018   Intractable right heel pain 12/20/2018   Psychological factors affecting morbid obesity ([place]) 01/27/2018   Tendinitis of right hip flexor 12/14/2017   Protein deficiency ([place]) 12/03/2017   Bilateral hip pain 10/17/2016   Left ureteral stone 04/11/2016   Low back pain 09/05/2015   Uterine fibroid 08/15/2015   Menorrhagia with irregular cycle 08/14/2015   Radiculitis of left cervical region 08/09/2015   Post-traumatic osteoarthritis of left knee 02/15/2015   History of bariatric surgery 02/06/2015   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   GERD (gastroesophageal reflux disease) 06/19/2014   History of continuous positive airway pressure (CPAP) therapy at home 06/19/2014   OSA on CPAP 06/19/2014   Need for immunization against influenza 05/11/2014   Other fatigue 05/11/2014   Snoring 05/11/2014   Mixed hyperlipidemia 04/10/2014   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   Vitamin D deficiency 01/11/2014   Heart palpitations 12/07/2013   Menopausal  symptom 05/18/2012   Leg swelling 05/18/2012   History of kidney stones 01/28/2012   Left flank pain 01/28/2012   Microscopic hematuria 01/28/2012   Sebaceous hyperplasia 10/24/2011   BACK PAIN, THORACIC REGION 07/25/2009   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/19/2006   OBESITY, NOS 05/19/2006   Major depressive disorder, recurrent episode ([place]) 05/19/2006   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    PCP: Beatrice Lecher, MD REFERRING PROVIDER: Aundria Mems, MD REFERRING DIAG: [phone] (ICD-10-CM) - Bilateral hip pain  THERAPY DIAG:  Pain in left hip  Pain in right hip  Muscle weakness (generalized)  Rationale for  Evaluation and Treatment Rehabilitation  ONSET DATE: 05/19/22  SUBJECTIVE:   SUBJECTIVE STATEMENT: The patient notes a chronic h/o bilateral hip pain since delivering her first child >25 years ago.  Pain worsened after having quadruplets that are now 52 years old.  She reports pain now is the worst it has ever been.  Pain wakes her up at night, walking aggravates pain. Patient notes R side is worse than L, and pain radiates into the groin and lateral thigh.  PERTINENT HISTORY: Long standing h/o bilateral hip pain after childbirth, weight loss s/p bariatric surgery 3 years ago, [employer] with cardiac ablation  PAIN:  Are you having pain? Yes: NPRS scale: 4/10 Pain location: bilateral hip pain Pain description: sharp Aggravating factors: goes to an 8/10 when she wakes up at night and when first rising in the morning Relieving factors: pain meds , biofreeze Gets some pain into the sacrum and into the R groin and into the lateral thigh  PRECAUTIONS: None  WEIGHT BEARING RESTRICTIONS No  FALLS:  Has patient fallen in last 6 months? 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 due to feet swelling  PLOF: Independent  PATIENT GOALS Strengthen hips to reduce pain   OBJECTIVE:   PATIENT SURVEYS:  FOTO 56% (up to 68% is goal)  SENSATION: WFL  MUSCLE LENGTH: Hamstrings: Right -18 deg; Left -20 deg Thomas test: Right *painful with neutral position;unable to extend off edge of mat; Left WFLs deg  POSTURE: rounded shoulders and forward head  PALPATION: Bilateral pain along lateral aspect, IT Band R tender with trigger point pain Hypomobility noted with PA mobs in thoracic and lumbar spine-- general  tightness  LOWER EXTREMITY ROM:   WFLs  LOWER EXTREMITY MMT:  MMT Right eval Left eval  Hip flexion 3+/5 4/5  Hip extension 4-/5 4-/5  Hip abduction 4-/5 4-/5  Hip adduction    Hip internal rotation    Hip external rotation    Knee flexion 4/5 4/5  Knee extension 4/5 4/5  Ankle dorsiflexion 4/5 5/5  Ankle plantarflexion    Ankle inversion    Ankle eversion     (Blank rows = not tested)  LOWER EXTREMITY SPECIAL TESTS:  Hip special tests: Marcello Moores test: positive  on right side Hip scour test negative  GAIT: Distance walked: 100 ft Comments: gait characterized by antalgic pattern after sitting   TODAY'S TREATMENT: 05/21/22: THEREX:  See list below for establishing HEP  PATIENT EDUCATION:  Education details: HEP Person educated: Patient Education method: Explanation, demo, handout Education comprehension: verbalized understanding and returned demonstration   HOME EXERCISE PROGRAM: Access Code: [email or web address] URL: [email or web address] Date: 05/21/2022 Prepared by: Margreta Journey  Jennise Both  Exercises - Supine Butterfly Groin Stretch  - 2 x daily - 7 x weekly - 1 sets - 1 reps - 60 seconds hold - Supine Piriformis Stretch Pulling Heel to Hip  - 2 x daily - 7 x weekly - 1 sets - 3 reps - 30 seconds hold - Supine Bridge  - 2 x daily - 7 x weekly - 1 sets - 10 reps - 3 seconds hold - Prone Press Up  - 2 x daily - 7 x weekly - 1 sets - 10 reps - Sidelying Hip Abduction  - 2 x daily - 7 x weekly - 1 sets - 10-12 reps - 3 seconds hold - Seated Hamstring Stretch with Chair  - 2 x daily - 7 x weekly - 1 sets - 3 reps - 30 seconds hold  ASSESSMENT:  CLINICAL IMPRESSION: Patient is a 51 y.o. female who was seen today for physical therapy evaluation and treatment for chronic bilateral hip pain.   She presents with impairments in strength, flexibility, and has pain with palpation.  PT to address deficits to promote improved mobility.   OBJECTIVE IMPAIRMENTS Abnormal gait,  decreased activity tolerance, difficulty walking, decreased strength, hypomobility, increased fascial restrictions, impaired flexibility, and pain.   ACTIVITY LIMITATIONS sleeping, bed mobility, and locomotion level  PARTICIPATION LIMITATIONS: community activity  PERSONAL FACTORS Time since onset of injury/illness/exacerbation are also affecting patient's functional outcome.   REHAB POTENTIAL: Good  CLINICAL DECISION MAKING: Stable/uncomplicated  EVALUATION COMPLEXITY: Low   GOALS: Goals reviewed with patient? Yes  SHORT TERM GOALS: Target date: 06/18/2022  The patient will be indep with HEP Baseline:no exercise Goal status: INITIAL  2.  The patient will verbalize understanding of home work station set up and potentially moving from recliner to have more core engagement t/o the day. Baseline: 56% Goal status: INITIAL  3.  The patient will report pain in the morning < or equal to 5/10 Baseline: 8/10 Goal status: INITIAL  4.  The patient will improve R hip strength to 4/5. Baseline: 3+/5 Goal status: INITIAL  LONG TERM GOALS: Target date: 07/16/2022   The patient will be indep with HEP progression. Baseline: no HEP Goal status: INITIAL  2.  The patient will improve FOTO to 68%. Baseline: 56% Goal status: INITIAL  3.  The patient will improve bilat hip abductor strength to 5/5. Baseline: 4-/5 Goal status: INITIAL  4.  The patient will improve bilat hamstring length to -10 from full extension. Baseline: -20 / -18 Goal status: INITIAL  5.  The patient will tolerate hip flexor stretch without pain R anterior hip. Baseline: + R hip thomas test Goal status: INITIAL  6.  The patient will be indep with home walking program and community wellness. Baseline: Member at [employer] Goal status: INITIAL   PLAN: PT FREQUENCY: 2x/week  PT DURATION: 8 weeks  PLANNED INTERVENTIONS: Therapeutic exercises, Therapeutic activity, Neuromuscular re-education, Balance  training, Gait training, Patient/Family education, Self Care, Joint mobilization, Aquatic Therapy, Dry Needling, Electrical stimulation, Spinal mobilization, Moist heat, Taping, and Manual therapy  PLAN FOR NEXT SESSION: Patient out of town x 1 week.  Will then check HEP, progress standing strengthening focusing on SLS, squats, sidestepping, and core stability.   [place], PT 05/21/2022, 12:16 PM

## 2022-06-02 ENCOUNTER — Ambulatory Visit: Payer: Managed Care, Other (non HMO) | Admitting: Rehabilitative and Restorative Service Providers"

## 2022-06-02 ENCOUNTER — Encounter: Payer: Self-pay | Admitting: Rehabilitative and Restorative Service Providers"

## 2022-06-02 DIAGNOSIS — M25551 Pain in right hip: Secondary | ICD-10-CM | POA: Diagnosis not present

## 2022-06-02 DIAGNOSIS — M25552 Pain in left hip: Secondary | ICD-10-CM

## 2022-06-02 DIAGNOSIS — M6281 Muscle weakness (generalized): Secondary | ICD-10-CM

## 2022-06-02 NOTE — Therapy (Signed)
OUTPATIENT PHYSICAL THERAPY LOWER EXTREMITY TREATMENT   Patient Name: Laura [name] MRN: [id number] DOB:Nov 19, 1970, 51 y.o., female Today's Date: 06/02/2022   PT End of Session - 06/02/22 1019     Visit Number 2    Number of Visits 16    Date for PT Re-Evaluation 07/20/22    Authorization Type cigna    PT Start Time 1017    PT Stop Time 1058    PT Time Calculation (min) 41 min    Activity Tolerance Patient tolerated treatment well    Behavior During Therapy WFL for tasks assessed/performed              Past Medical History:  Diagnosis Date   Allergic rhinitis    Arthritis    Depression    Dyslipidemia    Dysmenorrhea    Family history of adverse reaction to anesthesia    mother slow to wake up   Gall bladder disease    GERD (gastroesophageal reflux disease)    Headache    History of kidney stones    Infertility, female    Obesity    Pneumonia    Polycystic ovary disease    PONV (postoperative nausea and vomiting)    Sebaceous hyperplasia     on tretinoin   Sleep apnea    cpap   WPW (Wolff-Parkinson-White syndrome)    hx of ablation 2012   Past Surgical History:  Procedure Laterality Date   CESAREAN SECTION     x 2   gastric bypass surgery      INCISIONAL HERNIA REPAIR N/A 07/01/2021   Procedure: INCISIONAL HERNIA REPAIR WITH MESH;  Surgeon: Kinsinger, Arta Bruce, MD;  Location: [facility];  Service: General;  Laterality: N/A;   LAPAROSCOPIC CHOLECYSTECTOMY     left foot surgery      SLEEVE GASTROPLASTY     SUPRAVENTRICULAR TACHYCARDIA ABLATION N/A 07/23/2011   Procedure: SUPRAVENTRICULAR TACHYCARDIA ABLATION;  Surgeon: Evans Lance, MD;  Location: [facility] CATH LAB;  Service: Cardiovascular;  Laterality: N/A;   transthoracic echcardiogram  09/15/2006   Patient Active Problem List   Diagnosis Date Noted   Irregular periods 04/30/2022   Right upper quadrant pain 08/26/2021   Diarrhea 08/26/2021   Nausea 08/26/2021   Incisional hernia 07/01/2021   H/O  metal allergy 05/01/2020   Insomnia 05/01/2020   Iron deficiency 10/26/2019   Hx of gastric bypass 06/29/2019   Migraine 04/28/2019   Situational depression 04/28/2019   Family history of colon cancer 01/19/2019   Hx of adenomatous colonic polyps 01/19/2019   Pain and swelling of right knee 12/20/2018   Intractable right heel pain 12/20/2018   Psychological factors affecting morbid obesity ([place]) 01/27/2018   Tendinitis of right hip flexor 12/14/2017   Protein deficiency ([place]) 12/03/2017   Bilateral hip pain 10/17/2016   Left ureteral stone 04/11/2016   Low back pain 09/05/2015   Uterine fibroid 08/15/2015   Menorrhagia with irregular cycle 08/14/2015   Radiculitis of left cervical region 08/09/2015   Post-traumatic osteoarthritis of left knee 02/15/2015   History of bariatric surgery 02/06/2015   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   GERD (gastroesophageal reflux disease) 06/19/2014   History of continuous positive airway pressure (CPAP) therapy at home 06/19/2014   OSA on CPAP 06/19/2014   Need for immunization against influenza 05/11/2014   Other fatigue 05/11/2014   Snoring 05/11/2014   Mixed hyperlipidemia 04/10/2014   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   Vitamin D deficiency 01/11/2014   Heart palpitations 12/07/2013  Menopausal symptom 05/18/2012   Leg swelling 05/18/2012   History of kidney stones 01/28/2012   Left flank pain 01/28/2012   Microscopic hematuria 01/28/2012   Sebaceous hyperplasia 10/24/2011   BACK PAIN, THORACIC REGION 07/25/2009   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/19/2006   OBESITY, NOS 05/19/2006   Major depressive disorder, recurrent episode ([place]) 05/19/2006   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    PCP: Beatrice Lecher, MD REFERRING PROVIDER: Aundria Mems, MD REFERRING DIAG: [phone] (ICD-10-CM) - Bilateral hip pain  THERAPY DIAG:  Pain in left hip  Pain in right hip  Muscle weakness (generalized)  Rationale for  Evaluation and Treatment Rehabilitation  ONSET DATE: 05/19/22  SUBJECTIVE:   SUBJECTIVE STATEMENT: The patient has increased bilateral hip pain.  She was on vacation last week. She did her exercises --- they hurt, but she tolerated them.  PERTINENT HISTORY: Long standing h/o bilateral hip pain after childbirth, weight loss s/p bariatric surgery 3 years ago, [employer] with cardiac ablation, hernia repair 2022  PAIN:  Are you having pain? Yes: NPRS scale: 3/10 Pain location: bilateral hip pain Pain description: sharp Aggravating factors: goes to an 8/10 when she wakes up at night and when first rising in the morning Relieving factors: pain meds , biofreeze Gets some pain into the sacrum and into the R groin and into the lateral thigh.  Pain wakes her up at night, walking aggravates pain. Patient notes R side is worse than L, and pain radiates into the groin and lateral thigh.  PRECAUTIONS: None  WEIGHT BEARING RESTRICTIONS No  FALLS:  Has patient fallen in last 6 months? No  PATIENT GOALS Strengthen hips to reduce pain   OBJECTIVE:    LOWER EXTREMITY MMT:  MMT Right eval Left eval  Hip flexion 3+/5 4/5  Hip extension 4-/5 4-/5  Hip abduction 4-/5 4-/5  Hip adduction    Hip internal rotation    Hip external rotation    Knee flexion 4/5 4/5  Knee extension 4/5 4/5  Ankle dorsiflexion 4/5 5/5  Ankle plantarflexion    Ankle inversion    Ankle eversion     (Blank rows = not tested)   TODAY'S TREATMENT: 06/02/22: THEREX Nustep x 4 minutes level 6 LEs only Standing rocking anterior/posteriorly for weight shift and gentle stretching -- at steps with one LE supported on 8" surface Standing heel raises x 10 reps (hurt toe on vacation so held off on more reps) Isometric high lunge with holds x 30 seconds High lunge with trunk rotation x 5 reps Alternating lunges x 10 reps R and L sides Step up with alternating marching Attempted march with lunges,  unable Squat with chair touch x 10 reps x 2 sets  Supine Trunk rotation x 10 reps IT Band stretching supine with strap Hip adductor stretch x 1 rep with strap Bridges x 10 reps  HS stretch supine with strap x 1 rep x 30 seconds R and L sides  Prone Modified plank on knees and elbow x 3 reps x 5 second holds Hip flexor stretch with one leg off edge of mat  MANUAL STM lateral thigh and IT band with self care of using muscle roller to mobilize tissue   PATIENT EDUCATION:  Education details: HEP Person educated: Patient Education method: Consulting civil engineer, Systems developer, handout Education comprehension: verbalized understanding and returned demonstration   HOME EXERCISE PROGRAM: Access Code: [email or web address] URL: [email or web address] Date: 06/02/2022 Prepared by: Rudell Cobb  Exercises - Supine Butterfly Groin Stretch  - 2 x daily -  7 x weekly - 1 sets - 1 reps - 60 seconds hold - Supine Piriformis Stretch Pulling Heel to Hip  - 2 x daily - 7 x weekly - 1 sets - 3 reps - 30 seconds hold - Supine Bridge  - 2 x daily - 7 x weekly - 1 sets - 10 reps - 3 seconds hold - Prone Press Up  - 2 x daily - 7 x weekly - 1 sets - 10 reps - Sidelying Hip Abduction  - 2 x daily - 7 x weekly - 1 sets - 10-12 reps - 3 seconds hold - Seated Hamstring Stretch with Chair  - 2 x daily - 7 x weekly - 1 sets - 3 reps - 30 seconds hold - Squat with Chair Touch  - 2 x daily - 7 x weekly - 1 sets - 10 reps - Mini Lunge  - 2 x daily - 7 x weekly - 1 sets - 10 reps  ASSESSMENT:  CLINICAL IMPRESSION: The patient tolerated therex well today with additions of further strengthening.  She did not have any increase in pain with activities in session.  PT continuing to progress strengthening in hips, LEs, and core due to chronic bilateral hip pain.  Continue to progress to tolerance.   OBJECTIVE IMPAIRMENTS Abnormal gait, decreased activity tolerance, difficulty walking, decreased strength, hypomobility, increased  fascial restrictions, impaired flexibility, and pain.    GOALS: Goals reviewed with patient? Yes  SHORT TERM GOALS: Target date: 06/18/2022  The patient will be indep with HEP Baseline:no exercise Goal status: IN PROGRESS  2.  The patient will verbalize understanding of home work station set up and potentially moving from recliner to have more core engagement t/o the day. Baseline: 56% Goal status:IN PROGRESS  3.  The patient will report pain in the morning < or equal to 5/10 Baseline: 8/10 Goal status: IN PROGRESS  4.  The patient will improve R hip strength to 4/5. Baseline: 3+/5 Goal status: INITIAL  LONG TERM GOALS: Target date: 07/16/2022   The patient will be indep with HEP progression. Baseline: no HEP Goal status: INITIAL  2.  The patient will improve FOTO to 68%. Baseline: 56% Goal status: IN PROGRESS  3.  The patient will improve bilat hip abductor strength to 5/5. Baseline: 4-/5 Goal status:IN PROGRESS  4.  The patient will improve bilat hamstring length to -10 from full extension. Baseline: -20 / -18 Goal status:IN PROGRESS  5.  The patient will tolerate hip flexor stretch without pain R anterior hip. Baseline: + R hip thomas test Goal status:IN PROGRESS  6.  The patient will be indep with home walking program and community wellness. Baseline: Member at [employer] Goal status: IN PROGRESS   PLAN: PT FREQUENCY: 2x/week  PT DURATION: 8 weeks  PLANNED INTERVENTIONS: Therapeutic exercises, Therapeutic activity, Neuromuscular re-education, Balance training, Gait training, Patient/Family education, Self Care, Joint mobilization, Aquatic Therapy, Dry Needling, Electrical stimulation, Spinal mobilization, Moist heat, Taping, and Manual therapy  PLAN FOR NEXT SESSION: Continue to progress to tolerance-- standing strengthening focusing on SLS, squats, sidestepping, and core stability.   [place], PT 06/02/2022, 10:20 AM

## 2022-06-04 ENCOUNTER — Ambulatory Visit: Payer: Managed Care, Other (non HMO) | Admitting: Rehabilitative and Restorative Service Providers"

## 2022-06-05 ENCOUNTER — Telehealth: Payer: Managed Care, Other (non HMO) | Admitting: Family Medicine

## 2022-06-05 DIAGNOSIS — J069 Acute upper respiratory infection, unspecified: Secondary | ICD-10-CM

## 2022-06-05 MED ORDER — PROMETHAZINE-DM 6.25-15 MG/5ML PO SYRP: 5.0000 mL | ORAL_SOLUTION | Freq: Three times a day (TID) | ORAL | 0 refills | Status: DC | PRN

## 2022-06-05 MED ORDER — PREDNISONE 20 MG PO TABS: 40.0000 mg | ORAL_TABLET | Freq: Every day | ORAL | 0 refills | Status: AC

## 2022-06-05 MED ORDER — ALBUTEROL SULFATE HFA 108 (90 BASE) MCG/ACT IN AERS: 2.0000 | INHALATION_SPRAY | Freq: Four times a day (QID) | RESPIRATORY_TRACT | 0 refills | Status: DC | PRN

## 2022-06-05 NOTE — Patient Instructions (Signed)
Laura [name], thank you for joining [name], NP for today's virtual visit.  While this provider is not your primary care provider (PCP), if your PCP is located in our provider database this encounter information will be shared with them immediately following your visit.   [facility] account gives you access to today's visit and all your visits, tests, and labs performed at [facility] " click here if you don't have a [facility] account or go to [email or web address]  Consent: (Patient) Laura [name] provided verbal consent for this virtual visit at the beginning of the encounter.  Current Medications:  Current Outpatient Medications:    albuterol (VENTOLIN HFA) 108 (90 Base) MCG/ACT inhaler, Inhale 2 puffs into the lungs every 6 (six) hours as needed for wheezing or shortness of breath., Disp: 8 g, Rfl: 0   predniSONE (DELTASONE) 20 MG tablet, Take 2 tablets (40 mg total) by mouth daily with breakfast for 5 days., Disp: 10 tablet, Rfl: 0   promethazine-dextromethorphan (PROMETHAZINE-DM) 6.25-15 MG/5ML syrup, Take 5 mLs by mouth 3 (three) times daily as needed for cough., Disp: 118 mL, Rfl: 0   BIOTIN 5000 PO, Take 5,000 mcg by mouth daily., Disp: , Rfl:    Calcium Carbonate (CALCIUM 600 PO), Take 600 mg by mouth daily., Disp: , Rfl:    Cholecalciferol 50 MCG (2000 UT) TABS, Take 2,000 Units by mouth daily., Disp: , Rfl:    clobetasol ointment (TEMOVATE) 4.26 %, Apply 1 application topically 2 (two) times daily as needed (eczema)., Disp: , Rfl:    dicyclomine (BENTYL) 20 MG tablet, Take 20 mg by mouth 3 (three) times daily., Disp: , Rfl: 2   ergocalciferol (VITAMIN D2) 1.25 MG (50000 UT) capsule, Take 1 capsule by mouth every Monday, Wednesday, and Friday., Disp: , Rfl:    escitalopram (LEXAPRO) 10 MG tablet, TAKE 1 TABLET BY MOUTH EVERY DAY, Disp: 90 tablet, Rfl: 1   famotidine (PEPCID) 20 MG tablet, Take 20 mg by mouth at bedtime as needed for  heartburn or indigestion., Disp: , Rfl:    fluticasone (FLONASE) 50 MCG/ACT nasal spray, Place 2 sprays into both nostrils daily. (Patient taking differently: Place 2 sprays into both nostrils daily as needed for allergies.), Disp: 16 g, Rfl: 6   HYDROcodone-acetaminophen (NORCO/VICODIN) 5-325 MG tablet, Take 1 tablet by mouth every 8 (eight) hours as needed for moderate pain., Disp: 15 tablet, Rfl: 0   ipratropium (ATROVENT) 0.06 % nasal spray, INSTILL 2 SPRAYS INTO BOTH NOSTRILS FOUR TIMES A DAY, Disp: 45 mL, Rfl: 1   omeprazole (PRILOSEC) 40 MG capsule, Take 40 mg by mouth in the morning and at bedtime., Disp: , Rfl:    ondansetron (ZOFRAN ODT) 4 MG disintegrating tablet, Take 1 tablet (4 mg total) by mouth every 8 (eight) hours as needed for nausea or vomiting., Disp: 10 tablet, Rfl: 0   Prenatal Vit-Fe Fumarate-FA (PRENATAL VITAMIN PO), Take 1 tablet by mouth daily., Disp: , Rfl:    rizatriptan (MAXALT-MLT) 10 MG disintegrating tablet, Take 10 mg by mouth as needed for migraine. May repeat in 2 hours if needed, Disp: , Rfl:    topiramate (TOPAMAX) 100 MG tablet, TAKE 1 TABLET BY MOUTH TWICE A DAY, Disp: 180 tablet, Rfl: 1   WEGOVY 2.4 MG/0.75ML SOAJ, Inject 2.4 mg into the skin every Saturday., Disp: , Rfl:    Medications ordered in this encounter:  Meds ordered this encounter  Medications   albuterol (VENTOLIN HFA) 108 (90 Base) MCG/ACT  inhaler    Sig: Inhale 2 puffs into the lungs every 6 (six) hours as needed for wheezing or shortness of breath.    Dispense:  8 g    Refill:  0    Order Specific Question:   Supervising Provider    Answer:   Chase Picket [[id number]]   predniSONE (DELTASONE) 20 MG tablet    Sig: Take 2 tablets (40 mg total) by mouth daily with breakfast for 5 days.    Dispense:  10 tablet    Refill:  0    Order Specific Question:   Supervising Provider    Answer:   Chase Picket [id number]   promethazine-dextromethorphan (PROMETHAZINE-DM) 6.25-15 MG/5ML syrup     Sig: Take 5 mLs by mouth 3 (three) times daily as needed for cough.    Dispense:  118 mL    Refill:  0    Order Specific Question:   Supervising Provider    Answer:   Chase Picket [[id number]]     *If you need refills on other medications prior to your next appointment, please contact your pharmacy*  Follow-Up: Call back or seek an in-person evaluation if the symptoms worsen or if the condition fails to improve as anticipated.  [facility] [phone]  Other Instructions  -Take meds as prescribed -Rest -Use a cool mist humidifier especially during the winter months when heat dries out the air. - Use saline nose sprays frequently to help soothe nasal passages and promote drainage. -Saline irrigations of the nose can be very helpful if done frequently.             * 4X daily for 1 week*             * Use of a nettie pot can be helpful with this.  *Follow directions with this* *Boiled or distilled water only -stay hydrated by drinking plenty of fluids - Keep thermostat turn down low to prevent drying out sinuses - For any cough or congestion- robitussin DM or Delsym as needed - For fever or aches or pains- take tylenol or ibuprofen as directed on bottle             * for fevers greater than 101 orally you may alternate ibuprofen and tylenol every 3 hours.  If you do not improve you will need a follow up visit in person.  If breathing becomes more labored, or medications ordered today are not working, please go to the ED       If you have been instructed to have an in-person evaluation today at a local Urgent Care facility, please use the link below. It will take you to a list of all of our available [facility], including address, phone number and hours of operation. Please do not delay care.  [facility]  If you or a family member do not have a primary care provider, use the link below to schedule a visit and establish care. When you  choose a [facility] primary care physician or advanced practice provider, you gain a long-term partner in health. Find a Primary Care Provider  Learn more about 's in-office and virtual care options: [facility]

## 2022-06-05 NOTE — Progress Notes (Signed)
Virtual Visit Consent   Laura [name], you are scheduled for a virtual visit with a [facility] provider today. Just as with appointments in the office, your consent must be obtained to participate. Your consent will be active for this visit and any virtual visit you may have with one of our providers in the next 365 days. If you have a MyChart account, a copy of this consent can be sent to you electronically.  As this is a virtual visit, video technology does not allow for your provider to perform a traditional examination. This may limit your provider's ability to fully assess your condition. If your provider identifies any concerns that need to be evaluated in person or the need to arrange testing (such as labs, EKG, etc.), we will make arrangements to do so. Although advances in technology are sophisticated, we cannot ensure that it will always work on either your end or our end. If the connection with a video visit is poor, the visit may have to be switched to a telephone visit. With either a video or telephone visit, we are not always able to ensure that we have a secure connection.  By engaging in this virtual visit, you consent to the provision of healthcare and authorize for your insurance to be billed (if applicable) for the services provided during this visit. Depending on your insurance coverage, you may receive a charge related to this service.  I need to obtain your verbal consent now. Are you willing to proceed with your visit today? Laura [name] has provided verbal consent on 06/05/2022 for a virtual visit (video or telephone). Laura Mayo, NP  Date: 06/05/2022 2:45 PM  Virtual Visit via Video Note   I, Laura [name], connected with  Laura [name]  ([id number], June 05, 1971) on 06/05/22 at  3:15 PM EDT by a video-enabled telemedicine application and verified that I am speaking with the correct person using two identifiers.  Location: Patient: Virtual Visit Location Patient:  Home Provider: Virtual Visit Location Provider: Home Office   I discussed the limitations of evaluation and management by telemedicine and the availability of in person appointments. The patient expressed understanding and agreed to proceed.    History of Present Illness: Laura [name] is a 51 y.o. who identifies as a female who was assigned female at birth, and is being seen today for flu like symptoms. Started Monday night- recent trip from [place] Husband was sick last Friday. Chest congestion, fever T Max 102, chills, headache.  COVID test negative.  Flu shot was a month back.  Influenza This is a new problem. The current episode started in the past 7 days (3 days ago). The problem occurs constantly. The problem has been gradually worsening. Associated symptoms include chills, coughing, a fever, headaches and myalgias. Pertinent negatives include no abdominal pain, anorexia, arthralgias, change in bowel habit, chest pain, congestion, diaphoresis, fatigue, joint swelling, nausea, neck pain, numbness, rash, sore throat, swollen glands, urinary symptoms, vertigo, visual change, vomiting or weakness. Associated symptoms comments: Shortness of breath. The symptoms are aggravated by coughing (laying down makes it harder to breath). She has tried rest and sleep (OTC day quil and tylenol) for the symptoms. The treatment provided mild relief.     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   Migraine 04/28/2019   Situational depression 04/28/2019   Family history of colon cancer 01/19/2019   Hx of adenomatous colonic polyps 01/19/2019   Pain and swelling of right knee 12/20/2018   Intractable right heel pain  12/20/2018   Psychological factors affecting morbid obesity ([place]) 01/27/2018   Tendinitis of right hip flexor 12/14/2017   Protein deficiency ([place]) 12/03/2017   Bilateral hip pain 10/17/2016   Left ureteral stone 04/11/2016   Low back pain 09/05/2015   Uterine fibroid 08/15/2015   Menorrhagia with irregular cycle 08/14/2015   Radiculitis of left cervical region 08/09/2015   Post-traumatic osteoarthritis of left knee 02/15/2015   History of bariatric surgery 02/06/2015   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   GERD (gastroesophageal reflux disease) 06/19/2014   History of continuous positive airway pressure (CPAP) therapy at home 06/19/2014   OSA on CPAP 06/19/2014   Need for immunization against influenza 05/11/2014   Other fatigue 05/11/2014   Snoring 05/11/2014   Mixed hyperlipidemia 04/10/2014   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   Vitamin D deficiency 01/11/2014   Heart palpitations 12/07/2013   Menopausal symptom 05/18/2012   Leg swelling 05/18/2012   History of kidney stones 01/28/2012   Left flank pain 01/28/2012   Microscopic hematuria 01/28/2012   Sebaceous hyperplasia 10/24/2011   BACK PAIN, THORACIC REGION 07/25/2009   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, Disp: , Rfl:    clobetasol ointment (TEMOVATE) 8.18 %, Apply 1 application topically 2 (two) times daily as needed (eczema)., Disp:  , Rfl:    dicyclomine (BENTYL) 20 MG tablet, Take 20 mg by mouth 3 (three) times daily., Disp: , Rfl: 2   ergocalciferol (VITAMIN D2) 1.25 MG (50000 UT) capsule, Take 1 capsule by mouth every Monday, Wednesday, and Friday., Disp: , Rfl:    escitalopram (LEXAPRO) 10 MG tablet, TAKE 1 TABLET BY MOUTH EVERY DAY, Disp: 90 tablet, Rfl: 1   famotidine (PEPCID) 20 MG tablet, Take 20 mg by mouth at bedtime as needed for heartburn or indigestion., Disp: , Rfl:    fluticasone (FLONASE) 50 MCG/ACT nasal spray, Place 2 sprays into both nostrils daily. (Patient taking differently: Place 2 sprays into both nostrils daily as needed for allergies.), Disp: 16 g, Rfl: 6   HYDROcodone-acetaminophen (NORCO/VICODIN) 5-325 MG tablet, Take 1 tablet by mouth every 8 (eight) hours as needed for moderate pain., Disp: 15 tablet, Rfl: 0   ipratropium (ATROVENT) 0.06 % nasal spray, INSTILL 2 SPRAYS INTO BOTH NOSTRILS FOUR TIMES A DAY, Disp: 45 mL, Rfl: 1   omeprazole (PRILOSEC) 40 MG capsule, Take 40 mg by mouth in the morning and at bedtime., Disp: , Rfl:    ondansetron (ZOFRAN ODT) 4 MG disintegrating tablet, Take 1 tablet (4 mg total) by mouth every 8 (eight) hours  as needed for nausea or vomiting., Disp: 10 tablet, Rfl: 0   Prenatal Vit-Fe Fumarate-FA (PRENATAL VITAMIN PO), Take 1 tablet by mouth daily., Disp: , Rfl:    rizatriptan (MAXALT-MLT) 10 MG disintegrating tablet, Take 10 mg by mouth as needed for migraine. May repeat in 2 hours if needed, Disp: , Rfl:    topiramate (TOPAMAX) 100 MG tablet, TAKE 1 TABLET BY MOUTH TWICE A DAY, Disp: 180 tablet, Rfl: 1   WEGOVY 2.4 MG/0.75ML SOAJ, Inject 2.4 mg into the skin every Saturday., Disp: , Rfl:   Observations/Objective: Patient is well-developed, well-nourished in no acute distress.  Resting comfortably  at home.  Head is normocephalic, atraumatic.  No labored breathing.  Speech is clear and coherent with logical content.  Patient is alert and oriented at baseline.   Nasal tone Cough Hoarseness   Assessment and Plan:  1. Viral URI with cough  - albuterol (VENTOLIN HFA) 108 (90 Base) MCG/ACT inhaler; Inhale 2 puffs into the lungs every 6 (six) hours as needed for wheezing or shortness of breath.  Dispense: 8 g; Refill: 0 - predniSONE (DELTASONE) 20 MG tablet; Take 2 tablets (40 mg total) by mouth daily with breakfast for 5 days.  Dispense: 10 tablet; Refill: 0 - promethazine-dextromethorphan (PROMETHAZINE-DM) 6.25-15 MG/5ML syrup; Take 5 mLs by mouth 3 (three) times daily as needed for cough.  Dispense: 118 mL; Refill: 0  -Take meds as prescribed -Rest -Use a cool mist humidifier especially during the winter months when heat dries out the air. - Use saline nose sprays frequently to help soothe nasal passages and promote drainage. -Saline irrigations of the nose can be very helpful if done frequently.             * 4X daily for 1 week*             * Use of a nettie pot can be helpful with this.  *Follow directions with this* *Boiled or distilled water only -stay hydrated by drinking plenty of fluids - Keep thermostat turn down low to prevent drying out sinuses - For any cough or congestion- robitussin DM or Delsym as needed - For fever or aches or pains- take tylenol or ibuprofen as directed on bottle             * for fevers greater than 101 orally you may alternate ibuprofen and tylenol every 3 hours.  If you do not improve you will need a follow up visit in person.               Reviewed side effects, risks and benefits of medication.    Patient acknowledged agreement and understanding of the plan.  Past Medical, Surgical, Social History, Allergies, and Medications have been Reviewed.    Follow Up Instructions: I discussed the assessment and treatment plan with the patient. The patient was provided an opportunity to ask questions and all were answered. The patient agreed with the plan and demonstrated an understanding of the instructions.   A copy of instructions were sent to the patient via MyChart unless otherwise noted below.    The patient was advised to call back or seek an in-person evaluation if the symptoms worsen or if the condition fails to improve as anticipated.  Time:  I spent 10 minutes with the patient via telehealth technology discussing the above problems/concerns.    Laura Mayo, NP

## 2022-06-09 ENCOUNTER — Ambulatory Visit: Payer: Managed Care, Other (non HMO) | Admitting: Rehabilitative and Restorative Service Providers"

## 2022-06-09 DIAGNOSIS — M25551 Pain in right hip: Secondary | ICD-10-CM

## 2022-06-09 DIAGNOSIS — M6281 Muscle weakness (generalized): Secondary | ICD-10-CM

## 2022-06-09 DIAGNOSIS — M25552 Pain in left hip: Secondary | ICD-10-CM

## 2022-06-09 NOTE — Therapy (Signed)
OUTPATIENT PHYSICAL THERAPY LOWER EXTREMITY TREATMENT   Patient Name: Laura [name] MRN: [id number] DOB:08-27-1970, 51 y.o., female Today's Date: 06/09/2022   PT End of Session - 06/09/22 1017     Visit Number 3    Number of Visits 16    Date for PT Re-Evaluation 07/20/22    Authorization Type cigna    PT Start Time 4742    PT Stop Time 1100    PT Time Calculation (min) 41 min    Activity Tolerance Patient tolerated treatment well    Behavior During Therapy WFL for tasks assessed/performed              Past Medical History:  Diagnosis Date   Allergic rhinitis    Arthritis    Depression    Dyslipidemia    Dysmenorrhea    Family history of adverse reaction to anesthesia    mother slow to wake up   Gall bladder disease    GERD (gastroesophageal reflux disease)    Headache    History of kidney stones    Infertility, female    Obesity    Pneumonia    Polycystic ovary disease    PONV (postoperative nausea and vomiting)    Sebaceous hyperplasia     on tretinoin   Sleep apnea    cpap   WPW (Wolff-Parkinson-White syndrome)    hx of ablation 2012   Past Surgical History:  Procedure Laterality Date   CESAREAN SECTION     x 2   gastric bypass surgery      INCISIONAL HERNIA REPAIR N/A 07/01/2021   Procedure: INCISIONAL HERNIA REPAIR WITH MESH;  Surgeon: Kinsinger, Arta Bruce, MD;  Location: [facility];  Service: General;  Laterality: N/A;   LAPAROSCOPIC CHOLECYSTECTOMY     left foot surgery      SLEEVE GASTROPLASTY     SUPRAVENTRICULAR TACHYCARDIA ABLATION N/A 07/23/2011   Procedure: SUPRAVENTRICULAR TACHYCARDIA ABLATION;  Surgeon: Evans Lance, MD;  Location: [facility] CATH LAB;  Service: Cardiovascular;  Laterality: N/A;   transthoracic echcardiogram  09/15/2006   Patient Active Problem List   Diagnosis Date Noted   Irregular periods 04/30/2022   Right upper quadrant pain 08/26/2021   Diarrhea 08/26/2021   Nausea 08/26/2021   Incisional hernia 07/01/2021   H/O  metal allergy 05/01/2020   Insomnia 05/01/2020   Iron deficiency 10/26/2019   Hx of gastric bypass 06/29/2019   Migraine 04/28/2019   Situational depression 04/28/2019   Family history of colon cancer 01/19/2019   Hx of adenomatous colonic polyps 01/19/2019   Pain and swelling of right knee 12/20/2018   Intractable right heel pain 12/20/2018   Psychological factors affecting morbid obesity ([place]) 01/27/2018   Tendinitis of right hip flexor 12/14/2017   Protein deficiency ([place]) 12/03/2017   Bilateral hip pain 10/17/2016   Left ureteral stone 04/11/2016   Low back pain 09/05/2015   Uterine fibroid 08/15/2015   Menorrhagia with irregular cycle 08/14/2015   Radiculitis of left cervical region 08/09/2015   Post-traumatic osteoarthritis of left knee 02/15/2015   History of bariatric surgery 02/06/2015   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   GERD (gastroesophageal reflux disease) 06/19/2014   History of continuous positive airway pressure (CPAP) therapy at home 06/19/2014   OSA on CPAP 06/19/2014   Need for immunization against influenza 05/11/2014   Other fatigue 05/11/2014   Snoring 05/11/2014   Mixed hyperlipidemia 04/10/2014   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   Vitamin D deficiency 01/11/2014   Heart palpitations 12/07/2013  Menopausal symptom 05/18/2012   Leg swelling 05/18/2012   History of kidney stones 01/28/2012   Left flank pain 01/28/2012   Microscopic hematuria 01/28/2012   Sebaceous hyperplasia 10/24/2011   BACK PAIN, THORACIC REGION 07/25/2009   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/19/2006   OBESITY, NOS 05/19/2006   Major depressive disorder, recurrent episode ([place]) 05/19/2006   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    PCP: Beatrice Lecher, MD REFERRING PROVIDER: Aundria Mems, MD REFERRING DIAG: [phone] (ICD-10-CM) - Bilateral hip pain  THERAPY DIAG:  Pain in left hip  Pain in right hip  Muscle weakness (generalized)  Rationale for  Evaluation and Treatment Rehabilitation  ONSET DATE: 05/19/22  SUBJECTIVE:   SUBJECTIVE STATEMENT: The patient reports she has been sick and having difficulty with her breathing.  The anti-inflammatories have been helping her hips.  She notes she was negative for COVID, and this illness has been worse than COVID.  She is wearing a mask and continuing with a cough.   PERTINENT HISTORY: Long standing h/o bilateral hip pain after childbirth, weight loss s/p bariatric surgery 3 years ago, [employer] with cardiac ablation, hernia repair 2022  PAIN:  Are you having pain? Yes: NPRS scale: "not bad"/10 Pain location: bilateral hip pain Pain description: sharp Aggravating factors: goes to an 8/10 when she wakes up at night and when first rising in the morning Relieving factors: pain meds , biofreeze Gets some pain into the sacrum and into the R groin and into the lateral thigh.  Pain wakes her up at night, walking aggravates pain. Patient notes R side is worse than L, and pain radiates into the groin and lateral thigh.  PRECAUTIONS: None  WEIGHT BEARING RESTRICTIONS No  FALLS:  Has patient fallen in last 6 months? No  PATIENT GOALS Strengthen hips to reduce pain   OBJECTIVE:   LOWER EXTREMITY MMT:  MMT Right eval Left eval  Hip flexion 3+/5 4/5  Hip extension 4-/5 4-/5  Hip abduction 4-/5 4-/5  Hip adduction    Hip internal rotation    Hip external rotation    Knee flexion 4/5 4/5  Knee extension 4/5 4/5  Ankle dorsiflexion 4/5 5/5  Ankle plantarflexion    Ankle inversion    Ankle eversion     (Blank rows = not tested)  [facility] Adult PT Treatment:                                                        DATE: 06/09/22  Therapeutic Exercise: Nustep level 5 x 4 minutes with bilat UEs and LEs Step ups x 12 reps with alternating marching without UE support Alternating lunges x 10 reps Squat with chair touch x 12 reps Wall slides  x 3 reps x 5 second holds---stopped  due to knee pain Hip abduction at wall x 5 second holds x 5 reps bilaterally Supine:  trunk rotation x 10 reps, strap stretching for IT Band and adductor, and HS, bridges x 10 reps Prone: modified plank on knees x 10 reps x 3 seconds, quad stretch with strap, and hip flexor stretch off edge of mat  TODAY'S TREATMENT: 06/02/22: THEREX Nustep x 4 minutes level 6 LEs only Standing rocking anterior/posteriorly for weight shift and gentle stretching -- at steps with one LE supported on 8" surface Standing heel raises x 10  reps (hurt toe on vacation so held off on more reps) Isometric high lunge with holds x 30 seconds High lunge with trunk rotation x 5 reps Alternating lunges x 10 reps R and L sides Step up with alternating marching Attempted march with lunges, unable Squat with chair touch x 10 reps x 2 sets  Supine Trunk rotation x 10 reps IT Band stretching supine with strap Hip adductor stretch x 1 rep with strap Bridges x 10 reps  HS stretch supine with strap x 1 rep x 30 seconds R and L sides  Prone Modified plank on knees and elbow x 3 reps x 5 second holds Hip flexor stretch with one leg off edge of mat  MANUAL STM lateral thigh and IT band with self care of using muscle roller to mobilize tissue   PATIENT EDUCATION:  Education details: HEP Person educated: Patient Education method: Consulting civil engineer, Systems developer, handout Education comprehension: verbalized understanding and returned demonstration  HOME EXERCISE PROGRAM: Access Code: [email or web address] URL: [email or web address] Date: 06/02/2022 Prepared by: Rudell Cobb  Exercises - Supine Butterfly Groin Stretch  - 2 x daily - 7 x weekly - 1 sets - 1 reps - 60 seconds hold - Supine Piriformis Stretch Pulling Heel to Hip  - 2 x daily - 7 x weekly - 1 sets - 3 reps - 30 seconds hold - Supine Bridge  - 2 x daily - 7 x weekly - 1 sets - 10 reps - 3 seconds hold - Prone Press Up  - 2 x daily - 7 x weekly - 1 sets - 10  reps - Sidelying Hip Abduction  - 2 x daily - 7 x weekly - 1 sets - 10-12 reps - 3 seconds hold - Seated Hamstring Stretch with Chair  - 2 x daily - 7 x weekly - 1 sets - 3 reps - 30 seconds hold - Squat with Chair Touch  - 2 x daily - 7 x weekly - 1 sets - 10 reps - Mini Lunge  - 2 x daily - 7 x weekly - 1 sets - 10 reps  ASSESSMENT:  CLINICAL IMPRESSION: PT did not progress today, but reviewed prior activities due to recent illness. She did not have any increase in pain with activities in session.  PT continuing to progress strengthening in hips, LEs, and core due to chronic bilateral hip pain.  Continue to progress to tolerance.   OBJECTIVE IMPAIRMENTS Abnormal gait, decreased activity tolerance, difficulty walking, decreased strength, hypomobility, increased fascial restrictions, impaired flexibility, and pain.    GOALS: Goals reviewed with patient? Yes  SHORT TERM GOALS: Target date: 06/18/2022  The patient will be indep with HEP Baseline:no exercise Goal status: IN PROGRESS  2.  The patient will verbalize understanding of home work station set up and potentially moving from recliner to have more core engagement t/o the day. Baseline: working from recliner Goal status:IN PROGRESS  3.  The patient will report pain in the morning < or equal to 5/10 Baseline: 8/10 Goal status: IN PROGRESS  4.  The patient will improve R hip strength to 4/5. Baseline: 3+/5 Goal status: IN PROGRESS  LONG TERM GOALS: Target date: 07/16/2022   The patient will be indep with HEP progression. Baseline: no HEP Goal status: IN PROGRESS   2.  The patient will improve FOTO to 68%. Baseline: 56% Goal status: IN PROGRESS  3.  The patient will improve bilat hip abductor strength to 5/5. Baseline: 4-/5 Goal status:IN PROGRESS  4.  The  patient will improve bilat hamstring length to -10 from full extension. Baseline: -20 / -18 Goal status:IN PROGRESS  5.  The patient will tolerate hip flexor  stretch without pain R anterior hip. Baseline: + R hip thomas test Goal status:IN PROGRESS  6.  The patient will be indep with home walking program and community wellness. Baseline: Member at [employer] Goal status: IN PROGRESS   PLAN: PT FREQUENCY: 2x/week  PT DURATION: 8 weeks  PLANNED INTERVENTIONS: Therapeutic exercises, Therapeutic activity, Neuromuscular re-education, Balance training, Gait training, Patient/Family education, Self Care, Joint mobilization, Aquatic Therapy, Dry Needling, Electrical stimulation, Spinal mobilization, Moist heat, Taping, and Manual therapy  PLAN FOR NEXT SESSION:  Continue to progress to tolerance-- standing strengthening focusing on SLS, squats, sidestepping, and core stability.   [place], PT 06/09/2022, 10:18 AM

## 2022-06-11 ENCOUNTER — Encounter: Payer: Self-pay | Admitting: Rehabilitative and Restorative Service Providers"

## 2022-06-11 ENCOUNTER — Ambulatory Visit
Payer: Managed Care, Other (non HMO) | Attending: Sports Medicine | Admitting: Rehabilitative and Restorative Service Providers"

## 2022-06-11 DIAGNOSIS — M25551 Pain in right hip: Secondary | ICD-10-CM | POA: Insufficient documentation

## 2022-06-11 DIAGNOSIS — M6281 Muscle weakness (generalized): Secondary | ICD-10-CM | POA: Diagnosis present

## 2022-06-11 DIAGNOSIS — M25552 Pain in left hip: Secondary | ICD-10-CM | POA: Diagnosis not present

## 2022-06-11 NOTE — Therapy (Signed)
OUTPATIENT PHYSICAL THERAPY LOWER EXTREMITY TREATMENT   Patient Name: Laura [name] MRN: [id number] DOB:20-Apr-1971, 51 y.o., female Today's Date: 06/11/2022   PT End of Session - 06/11/22 1025     Visit Number 4    Number of Visits 16    Date for PT Re-Evaluation 07/20/22    Authorization Type cigna    PT Start Time 4098    PT Stop Time 1102    PT Time Calculation (min) 39 min    Activity Tolerance Patient tolerated treatment well    Behavior During Therapy WFL for tasks assessed/performed              Past Medical History:  Diagnosis Date   Allergic rhinitis    Arthritis    Depression    Dyslipidemia    Dysmenorrhea    Family history of adverse reaction to anesthesia    mother slow to wake up   Gall bladder disease    GERD (gastroesophageal reflux disease)    Headache    History of kidney stones    Infertility, female    Obesity    Pneumonia    Polycystic ovary disease    PONV (postoperative nausea and vomiting)    Sebaceous hyperplasia     on tretinoin   Sleep apnea    cpap   WPW (Wolff-Parkinson-White syndrome)    hx of ablation 2012   Past Surgical History:  Procedure Laterality Date   CESAREAN SECTION     x 2   gastric bypass surgery      INCISIONAL HERNIA REPAIR N/A 07/01/2021   Procedure: INCISIONAL HERNIA REPAIR WITH MESH;  Surgeon: Kinsinger, Arta Bruce, MD;  Location: [facility];  Service: General;  Laterality: N/A;   LAPAROSCOPIC CHOLECYSTECTOMY     left foot surgery      SLEEVE GASTROPLASTY     SUPRAVENTRICULAR TACHYCARDIA ABLATION N/A 07/23/2011   Procedure: SUPRAVENTRICULAR TACHYCARDIA ABLATION;  Surgeon: Evans Lance, MD;  Location: [facility] CATH LAB;  Service: Cardiovascular;  Laterality: N/A;   transthoracic echcardiogram  09/15/2006   Patient Active Problem List   Diagnosis Date Noted   Irregular periods 04/30/2022   Right upper quadrant pain 08/26/2021   Diarrhea 08/26/2021   Nausea 08/26/2021   Incisional hernia 07/01/2021   H/O  metal allergy 05/01/2020   Insomnia 05/01/2020   Iron deficiency 10/26/2019   Hx of gastric bypass 06/29/2019   Migraine 04/28/2019   Situational depression 04/28/2019   Family history of colon cancer 01/19/2019   Hx of adenomatous colonic polyps 01/19/2019   Pain and swelling of right knee 12/20/2018   Intractable right heel pain 12/20/2018   Psychological factors affecting morbid obesity ([place]) 01/27/2018   Tendinitis of right hip flexor 12/14/2017   Protein deficiency ([place]) 12/03/2017   Bilateral hip pain 10/17/2016   Left ureteral stone 04/11/2016   Low back pain 09/05/2015   Uterine fibroid 08/15/2015   Menorrhagia with irregular cycle 08/14/2015   Radiculitis of left cervical region 08/09/2015   Post-traumatic osteoarthritis of left knee 02/15/2015   History of bariatric surgery 02/06/2015   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   GERD (gastroesophageal reflux disease) 06/19/2014   History of continuous positive airway pressure (CPAP) therapy at home 06/19/2014   OSA on CPAP 06/19/2014   Need for immunization against influenza 05/11/2014   Other fatigue 05/11/2014   Snoring 05/11/2014   Mixed hyperlipidemia 04/10/2014   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   Vitamin D deficiency 01/11/2014   Heart palpitations 12/07/2013  Menopausal symptom 05/18/2012   Leg swelling 05/18/2012   History of kidney stones 01/28/2012   Left flank pain 01/28/2012   Microscopic hematuria 01/28/2012   Sebaceous hyperplasia 10/24/2011   BACK PAIN, THORACIC REGION 07/25/2009   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/19/2006   OBESITY, NOS 05/19/2006   Major depressive disorder, recurrent episode ([place]) 05/19/2006   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    PCP: Beatrice Lecher, MD REFERRING PROVIDER: Aundria Mems, MD REFERRING DIAG: [phone] (ICD-10-CM) - Bilateral hip pain  THERAPY DIAG:  Pain in left hip  Pain in right hip  Muscle weakness (generalized)  Rationale for  Evaluation and Treatment Rehabilitation  ONSET DATE: 05/19/22  SUBJECTIVE:   SUBJECTIVE STATEMENT: The patient reports she is doing well.  She does attribute some of this to use of steroids for her cough.   PERTINENT HISTORY: Long standing h/o bilateral hip pain after childbirth, weight loss s/p bariatric surgery 3 years ago, [employer] with cardiac ablation, hernia repair 2022  PAIN:  Are you having pain? Yes: NPRS scale: 1/10 Pain location: bilateral hip pain Pain description: sharp Aggravating factors: goes to an 8/10 when she wakes up at night and when first rising in the morning Relieving factors: pain meds , biofreeze Gets some pain into the sacrum and into the R groin and into the lateral thigh.  Pain wakes her up at night, walking aggravates pain. Patient notes R side is worse than L, and pain radiates into the groin and lateral thigh.  PRECAUTIONS: None  WEIGHT BEARING RESTRICTIONS No  PATIENT GOALS Strengthen hips to reduce pain  OBJECTIVE:   LOWER EXTREMITY MMT:  MMT Right eval Left eval  Hip flexion 3+/5 4/5  Hip extension 4-/5 4-/5  Hip abduction 4-/5 4-/5  Hip adduction    Hip internal rotation    Hip external rotation    Knee flexion 4/5 4/5  Knee extension 4/5 4/5  Ankle dorsiflexion 4/5 5/5  Ankle plantarflexion    Ankle inversion    Ankle eversion     (Blank rows = not tested)  [facility] Adult PT Treatment:                                  _______________________________________              DATE: 06/11/22 Therapeutic Exercise: Treadmill x 4 minutes up to 2.2 mph with UE support Wall slides  x 10 reps  Hip abduction at wall x 5 second holds x 5 reps bilaterally Forward fold using elevated mat into bilat hamstring stretch, hip adductor stretch, trunk rotation in standing Anti-rotation x 10 reps R and L sides Mini lunges x 10 reps Marching with 5 lb kettle bell overhead Supine:  hip adductor stretch, piriformis stretch, bridges x 10  reps Prone: prone press ups, modified plank on knees x 10 reps x 3 seconds Quadriped: cat/cow x 10 reps, primal push up, attempted heel sitting, but knees want to lock  [facility] Adult PT Treatment:                                                        DATE: 06/09/22  Therapeutic Exercise: Nustep level 5 x 4 minutes with bilat UEs and LEs Step ups x 12 reps with  alternating marching without UE support Alternating lunges x 10 reps Squat with chair touch x 12 reps Wall slides  x 3 reps x 5 second holds---stopped due to knee pain Hip abduction at wall x 5 second holds x 5 reps bilaterally Supine:  trunk rotation x 10 reps, strap stretching for IT Band and adductor, and HS, bridges x 10 reps Prone: modified plank on knees x 10 reps x 3 seconds, quad stretch with strap, and hip flexor stretch off edge of mat  06/02/22: THEREX Nustep x 4 minutes level 6 LEs only Standing rocking anterior/posteriorly for weight shift and gentle stretching -- at steps with one LE supported on 8" surface Standing heel raises x 10 reps (hurt toe on vacation so held off on more reps) Isometric high lunge with holds x 30 seconds High lunge with trunk rotation x 5 reps Alternating lunges x 10 reps R and L sides Step up with alternating marching Attempted march with lunges, unable Squat with chair touch x 10 reps x 2 sets  Supine Trunk rotation x 10 reps IT Band stretching supine with strap Hip adductor stretch x 1 rep with strap Bridges x 10 reps  HS stretch supine with strap x 1 rep x 30 seconds R and L sides  Prone Modified plank on knees and elbow x 3 reps x 5 second holds Hip flexor stretch with one leg off edge of mat  MANUAL STM lateral thigh and IT band with self care of using muscle roller to mobilize tissue   PATIENT EDUCATION:  Education details: HEP Person educated: Patient Education method: Consulting civil engineer, Systems developer, handout Education comprehension: verbalized understanding and returned  demonstration  HOME EXERCISE PROGRAM: Access Code: [email or web address] URL: [email or web address] Date: 06/11/2022 Prepared by: Rudell Cobb  Exercises - Supine Piriformis Stretch Pulling Heel to Hip  - 2 x daily - 7 x weekly - 1 sets - 3 reps - 30 seconds hold - Prone Press Up  - 2 x daily - 7 x weekly - 1 sets - 10 reps - Plank on Knees  - 2 x daily - 7 x weekly - 1 sets - 10 reps - Seated Hamstring Stretch with Chair  - 2 x daily - 7 x weekly - 1 sets - 3 reps - 30 seconds hold - Squat with Chair Touch  - 2 x daily - 7 x weekly - 1 sets - 10 reps - Mini Lunge  - 2 x daily - 7 x weekly - 1 sets - 10 reps - Standing Isometric Hip Abduction with Knee at 90 at Wall  - 2 x daily - 7 x weekly - 1 sets - 10 reps - Side Lunge Adductor Stretch  - 2 x daily - 7 x weekly - 1 sets - 10 reps ASSESSMENT:  CLINICAL IMPRESSION: The patient tolerated progression of core and hip strengthening well today. PT modified her HEP.  She did not have any increase in pain with activities in session.  PT continuing to progress strengthening in hips, LEs, and core due to chronic bilateral hip pain.  Continue to progress to tolerance.   OBJECTIVE IMPAIRMENTS Abnormal gait, decreased activity tolerance, difficulty walking, decreased strength, hypomobility, increased fascial restrictions, impaired flexibility, and pain.    GOALS: Goals reviewed with patient? Yes  SHORT TERM GOALS: Target date: 06/18/2022  The patient will be indep with HEP Baseline:no exercise Goal status: IN PROGRESS  2.  The patient will verbalize understanding of home work station set up and potentially moving from  recliner to have more core engagement t/o the day. Baseline: working from recliner Goal status:IN PROGRESS  3.  The patient will report pain in the morning < or equal to 5/10 Baseline: 8/10 Goal status: IN PROGRESS  4.  The patient will improve R hip strength to 4/5. Baseline: 3+/5 Goal status: IN PROGRESS  LONG  TERM GOALS: Target date: 07/16/2022   The patient will be indep with HEP progression. Baseline: no HEP Goal status: IN PROGRESS   2.  The patient will improve FOTO to 68%. Baseline: 56% Goal status: IN PROGRESS  3.  The patient will improve bilat hip abductor strength to 5/5. Baseline: 4-/5 Goal status:IN PROGRESS  4.  The patient will improve bilat hamstring length to -10 from full extension. Baseline: -20 / -18 Goal status:IN PROGRESS  5.  The patient will tolerate hip flexor stretch without pain R anterior hip. Baseline: + R hip thomas test Goal status:IN PROGRESS  6.  The patient will be indep with home walking program and community wellness. Baseline: Member at [employer] Goal status: IN PROGRESS   PLAN: PT FREQUENCY: 2x/week  PT DURATION: 8 weeks  PLANNED INTERVENTIONS: Therapeutic exercises, Therapeutic activity, Neuromuscular re-education, Balance training, Gait training, Patient/Family education, Self Care, Joint mobilization, Aquatic Therapy, Dry Needling, Electrical stimulation, Spinal mobilization, Moist heat, Taping, and Manual therapy  PLAN FOR NEXT SESSION:  Continue to progress to tolerance-- standing strengthening focusing on SLS, squats, sidestepping, and core stability.   [place], PT 06/11/2022, 3:21 PM

## 2022-06-13 ENCOUNTER — Telehealth: Payer: Managed Care, Other (non HMO) | Admitting: Physician Assistant

## 2022-06-13 DIAGNOSIS — B9689 Other specified bacterial agents as the cause of diseases classified elsewhere: Secondary | ICD-10-CM | POA: Diagnosis not present

## 2022-06-13 DIAGNOSIS — J208 Acute bronchitis due to other specified organisms: Secondary | ICD-10-CM

## 2022-06-13 MED ORDER — AZITHROMYCIN 250 MG PO TABS: ORAL_TABLET | ORAL | 0 refills | Status: AC

## 2022-06-13 MED ORDER — BENZONATATE 100 MG PO CAPS: 100.0000 mg | ORAL_CAPSULE | Freq: Three times a day (TID) | ORAL | 0 refills | Status: DC | PRN

## 2022-06-13 MED ORDER — PROMETHAZINE-DM 6.25-15 MG/5ML PO SYRP: 5.0000 mL | ORAL_SOLUTION | Freq: Four times a day (QID) | ORAL | 0 refills | Status: DC | PRN

## 2022-06-13 NOTE — Progress Notes (Signed)
Virtual Visit Consent   Laura [name], you are scheduled for a virtual visit with a [facility] provider today. Just as with appointments in the office, your consent must be obtained to participate. Your consent will be active for this visit and any virtual visit you may have with one of our providers in the next 365 days. If you have a MyChart account, a copy of this consent can be sent to you electronically.  As this is a virtual visit, video technology does not allow for your provider to perform a traditional examination. This may limit your provider's ability to fully assess your condition. If your provider identifies any concerns that need to be evaluated in person or the need to arrange testing (such as labs, EKG, etc.), we will make arrangements to do so. Although advances in technology are sophisticated, we cannot ensure that it will always work on either your end or our end. If the connection with a video visit is poor, the visit may have to be switched to a telephone visit. With either a video or telephone visit, we are not always able to ensure that we have a secure connection.  By engaging in this virtual visit, you consent to the provision of healthcare and authorize for your insurance to be billed (if applicable) for the services provided during this visit. Depending on your insurance coverage, you may receive a charge related to this service.  I need to obtain your verbal consent now. Are you willing to proceed with your visit today? Amyiah Gaba has provided verbal consent on 06/13/2022 for a virtual visit (video or telephone). Mar Daring, PA-C  Date: 06/13/2022 3:03 PM  Virtual Visit via Video Note   I, Mar Daring, connected with  Laura [name]  ([id number], 21-Apr-1971) on 06/13/22 at  3:00 PM EDT by a video-enabled telemedicine application and verified that I am speaking with the correct person using two identifiers.  Location: Patient: Virtual Visit Location  Patient: Home Provider: Virtual Visit Location Provider: Home Office   I discussed the limitations of evaluation and management by telemedicine and the availability of in person appointments. The patient expressed understanding and agreed to proceed.    History of Present Illness: Makana [name] is a 51 y.o. who identifies as a female who was assigned female at birth, and is being seen today for URI symptoms.  HPI: URI  This is a new problem. The current episode started 1 to 4 weeks ago (Had a virtual visit on Thursday, 06/05/22). Maximum temperature: had initially, but not any longer. Associated symptoms include congestion, coughing, headaches, a plugged ear sensation and a sore throat. Pertinent negatives include no rhinorrhea or sinus pain. Associated symptoms comments: Fatigue, chills. She has tried acetaminophen, decongestant and increased fluids (prednisone, albuterol, promethazine dm) for the symptoms. The treatment provided no relief.    At home Covid 19 testing is negative.  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   Migraine 04/28/2019   Situational depression 04/28/2019   Family history of colon cancer 01/19/2019   Hx of adenomatous colonic polyps 01/19/2019   Pain and swelling of right knee 12/20/2018   Intractable right heel pain 12/20/2018   Psychological factors  affecting morbid obesity ([place]) 01/27/2018   Tendinitis of right hip flexor 12/14/2017   Protein deficiency ([place]) 12/03/2017   Bilateral hip pain 10/17/2016   Left ureteral stone 04/11/2016   Low back pain 09/05/2015   Uterine fibroid 08/15/2015   Menorrhagia with irregular cycle 08/14/2015   Radiculitis of left cervical region  08/09/2015   Post-traumatic osteoarthritis of left knee 02/15/2015   History of bariatric surgery 02/06/2015   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   GERD (gastroesophageal reflux disease) 06/19/2014   History of continuous positive airway pressure (CPAP) therapy at home 06/19/2014   OSA on CPAP 06/19/2014   Need for immunization against influenza 05/11/2014   Other fatigue 05/11/2014   Snoring 05/11/2014   Mixed hyperlipidemia 04/10/2014   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   Vitamin D deficiency 01/11/2014   Heart palpitations 12/07/2013   Menopausal symptom 05/18/2012   Leg swelling 05/18/2012   History of kidney stones 01/28/2012   Left flank pain 01/28/2012   Microscopic hematuria 01/28/2012   Sebaceous hyperplasia 10/24/2011   BACK PAIN, THORACIC REGION 07/25/2009   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/19/2006   OBESITY, NOS 05/19/2006   Major depressive disorder, recurrent episode ([place]) 05/19/2006   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    Allergies:  Allergies  Allergen Reactions   Morphine Itching   Chromium Other (See Comments)    causes contact dermatitis   Neomycin     Blisters on hands   Penicillins Hives   Medications:  Current Outpatient Medications:    azithromycin (ZITHROMAX) 250 MG tablet, Take 2 tablets on day 1, then 1 tablet daily on days 2 through 5, Disp: 6 tablet, Rfl: 0   benzonatate (TESSALON) 100 MG capsule, Take 1 capsule (100 mg total) by mouth 3 (three) times daily as needed., Disp: 30 capsule, Rfl: 0   promethazine-dextromethorphan (PROMETHAZINE-DM) 6.25-15 MG/5ML syrup, Take 5 mLs by mouth 4 (four) times daily as needed., Disp: 118 mL, Rfl: 0   albuterol (VENTOLIN HFA) 108 (90 Base) MCG/ACT inhaler, Inhale 2 puffs into the lungs every 6 (six) hours as needed for wheezing or shortness of breath., Disp: 8 g, Rfl: 0   BIOTIN 5000 PO, Take 5,000 mcg by mouth daily., Disp: , Rfl:    Calcium Carbonate (CALCIUM 600 PO), Take 600 mg by mouth daily.,  Disp: , Rfl:    Cholecalciferol 50 MCG (2000 UT) TABS, Take 2,000 Units by mouth daily., Disp: , Rfl:    clobetasol ointment (TEMOVATE) 4.01 %, Apply 1 application topically 2 (two) times daily as needed (eczema)., Disp: , Rfl:    dicyclomine (BENTYL) 20 MG tablet, Take 20 mg by mouth 3 (three) times daily., Disp: , Rfl: 2   ergocalciferol (VITAMIN D2) 1.25 MG (50000 UT) capsule, Take 1 capsule by mouth every Monday, Wednesday, and Friday., Disp: , Rfl:    escitalopram (LEXAPRO) 10 MG tablet, TAKE 1 TABLET BY MOUTH EVERY DAY, Disp: 90 tablet, Rfl: 1   famotidine (PEPCID) 20 MG tablet, Take 20 mg by mouth at bedtime as needed for heartburn or indigestion., Disp: , Rfl:    fluticasone (FLONASE) 50 MCG/ACT nasal spray, Place 2 sprays into both nostrils daily. (Patient taking differently: Place 2 sprays into both nostrils daily as needed for allergies.), Disp: 16 g, Rfl: 6   HYDROcodone-acetaminophen (NORCO/VICODIN) 5-325 MG tablet, Take 1 tablet by mouth every 8 (eight) hours as needed for moderate pain., Disp: 15 tablet, Rfl: 0   ipratropium (ATROVENT) 0.06 % nasal spray, INSTILL 2 SPRAYS INTO BOTH NOSTRILS FOUR  TIMES A DAY, Disp: 45 mL, Rfl: 1   omeprazole (PRILOSEC) 40 MG capsule, Take 40 mg by mouth in the morning and at bedtime., Disp: , Rfl:    ondansetron (ZOFRAN ODT) 4 MG disintegrating tablet, Take 1 tablet (4 mg total) by mouth every 8 (eight) hours as needed for nausea or vomiting., Disp: 10 tablet, Rfl: 0   Prenatal Vit-Fe Fumarate-FA (PRENATAL VITAMIN PO), Take 1 tablet by mouth daily., Disp: , Rfl:    rizatriptan (MAXALT-MLT) 10 MG disintegrating tablet, Take 10 mg by mouth as needed for migraine. May repeat in 2 hours if needed, Disp: , Rfl:    topiramate (TOPAMAX) 100 MG tablet, TAKE 1 TABLET BY MOUTH TWICE A DAY, Disp: 180 tablet, Rfl: 1   WEGOVY 2.4 MG/0.75ML SOAJ, Inject 2.4 mg into the skin every Saturday., Disp: , Rfl:   Observations/Objective: Patient is well-developed,  well-nourished in no acute distress.  Resting comfortably at home.  Head is normocephalic, atraumatic.  No labored breathing.  Speech is clear and coherent with logical content.  Patient is alert and oriented at baseline.    Assessment and Plan: 1. Acute bacterial bronchitis - azithromycin (ZITHROMAX) 250 MG tablet; Take 2 tablets on day 1, then 1 tablet daily on days 2 through 5  Dispense: 6 tablet; Refill: 0 - promethazine-dextromethorphan (PROMETHAZINE-DM) 6.25-15 MG/5ML syrup; Take 5 mLs by mouth 4 (four) times daily as needed.  Dispense: 118 mL; Refill: 0 - benzonatate (TESSALON) 100 MG capsule; Take 1 capsule (100 mg total) by mouth 3 (three) times daily as needed.  Dispense: 30 capsule; Refill: 0  - Worsening over a week despite OTC medications - Will treat with Z-pack, Promethazine DM and tessalon perles - Can continue Mucinex  - Push fluids.  - Rest.  - Steam and humidifier can help - Seek in person evaluation if worsening or symptoms fail to improve   Follow Up Instructions: I discussed the assessment and treatment plan with the patient. The patient was provided an opportunity to ask questions and all were answered. The patient agreed with the plan and demonstrated an understanding of the instructions.  A copy of instructions were sent to the patient via MyChart unless otherwise noted below.    The patient was advised to call back or seek an in-person evaluation if the symptoms worsen or if the condition fails to improve as anticipated.  Time:  I spent 12 minutes with the patient via telehealth technology discussing the above problems/concerns.    Mar Daring, PA-C

## 2022-06-13 NOTE — Patient Instructions (Signed)
Laura [name], thank you for joining Mar Daring, PA-C for today's virtual visit.  While this provider is not your primary care provider (PCP), if your PCP is located in our provider database this encounter information will be shared with them immediately following your visit.   [facility] account gives you access to today's visit and all your visits, tests, and labs performed at [facility] " click here if you don't have a [facility] account or go to [email or web address]  Consent: (Patient) Laura [name] provided verbal consent for this virtual visit at the beginning of the encounter.  Current Medications:  Current Outpatient Medications:    azithromycin (ZITHROMAX) 250 MG tablet, Take 2 tablets on day 1, then 1 tablet daily on days 2 through 5, Disp: 6 tablet, Rfl: 0   benzonatate (TESSALON) 100 MG capsule, Take 1 capsule (100 mg total) by mouth 3 (three) times daily as needed., Disp: 30 capsule, Rfl: 0   promethazine-dextromethorphan (PROMETHAZINE-DM) 6.25-15 MG/5ML syrup, Take 5 mLs by mouth 4 (four) times daily as needed., Disp: 118 mL, Rfl: 0   albuterol (VENTOLIN HFA) 108 (90 Base) MCG/ACT inhaler, Inhale 2 puffs into the lungs every 6 (six) hours as needed for wheezing or shortness of breath., Disp: 8 g, Rfl: 0   BIOTIN 5000 PO, Take 5,000 mcg by mouth daily., Disp: , Rfl:    Calcium Carbonate (CALCIUM 600 PO), Take 600 mg by mouth daily., Disp: , Rfl:    Cholecalciferol 50 MCG (2000 UT) TABS, Take 2,000 Units by mouth daily., Disp: , Rfl:    clobetasol ointment (TEMOVATE) 6.65 %, Apply 1 application topically 2 (two) times daily as needed (eczema)., Disp: , Rfl:    dicyclomine (BENTYL) 20 MG tablet, Take 20 mg by mouth 3 (three) times daily., Disp: , Rfl: 2   ergocalciferol (VITAMIN D2) 1.25 MG (50000 UT) capsule, Take 1 capsule by mouth every Monday, Wednesday, and Friday., Disp: , Rfl:    escitalopram (LEXAPRO) 10 MG tablet, TAKE 1  TABLET BY MOUTH EVERY DAY, Disp: 90 tablet, Rfl: 1   famotidine (PEPCID) 20 MG tablet, Take 20 mg by mouth at bedtime as needed for heartburn or indigestion., Disp: , Rfl:    fluticasone (FLONASE) 50 MCG/ACT nasal spray, Place 2 sprays into both nostrils daily. (Patient taking differently: Place 2 sprays into both nostrils daily as needed for allergies.), Disp: 16 g, Rfl: 6   HYDROcodone-acetaminophen (NORCO/VICODIN) 5-325 MG tablet, Take 1 tablet by mouth every 8 (eight) hours as needed for moderate pain., Disp: 15 tablet, Rfl: 0   ipratropium (ATROVENT) 0.06 % nasal spray, INSTILL 2 SPRAYS INTO BOTH NOSTRILS FOUR TIMES A DAY, Disp: 45 mL, Rfl: 1   omeprazole (PRILOSEC) 40 MG capsule, Take 40 mg by mouth in the morning and at bedtime., Disp: , Rfl:    ondansetron (ZOFRAN ODT) 4 MG disintegrating tablet, Take 1 tablet (4 mg total) by mouth every 8 (eight) hours as needed for nausea or vomiting., Disp: 10 tablet, Rfl: 0   Prenatal Vit-Fe Fumarate-FA (PRENATAL VITAMIN PO), Take 1 tablet by mouth daily., Disp: , Rfl:    rizatriptan (MAXALT-MLT) 10 MG disintegrating tablet, Take 10 mg by mouth as needed for migraine. May repeat in 2 hours if needed, Disp: , Rfl:    topiramate (TOPAMAX) 100 MG tablet, TAKE 1 TABLET BY MOUTH TWICE A DAY, Disp: 180 tablet, Rfl: 1   WEGOVY 2.4 MG/0.75ML SOAJ, Inject 2.4 mg into the skin every Saturday., Disp: ,  Rfl:    Medications ordered in this encounter:  Meds ordered this encounter  Medications   azithromycin (ZITHROMAX) 250 MG tablet    Sig: Take 2 tablets on day 1, then 1 tablet daily on days 2 through 5    Dispense:  6 tablet    Refill:  0    Order Specific Question:   Supervising Provider    Answer:   Chase Picket [[id number]]   promethazine-dextromethorphan (PROMETHAZINE-DM) 6.25-15 MG/5ML syrup    Sig: Take 5 mLs by mouth 4 (four) times daily as needed.    Dispense:  118 mL    Refill:  0    Order Specific Question:   Supervising Provider    Answer:    Chase Picket [[id number]]   benzonatate (TESSALON) 100 MG capsule    Sig: Take 1 capsule (100 mg total) by mouth 3 (three) times daily as needed.    Dispense:  30 capsule    Refill:  0    Order Specific Question:   Supervising Provider    Answer:   Chase Picket [id number]     *If you need refills on other medications prior to your next appointment, please contact your pharmacy*  Follow-Up: Call back or seek an in-person evaluation if the symptoms worsen or if the condition fails to improve as anticipated.  [facility] [phone]  Other Instructions Acute Bronchitis, Adult  Acute bronchitis is sudden inflammation of the main airways (bronchi) that come off the windpipe (trachea) in the lungs. The swelling causes the airways to get smaller and make more mucus than normal. This can make it hard to breathe and can cause coughing or noisy breathing (wheezing). Acute bronchitis may last several weeks. The cough may last longer. Allergies, asthma, and exposure to smoke may make the condition worse. What are the causes? This condition can be caused by germs and by substances that irritate the lungs, including: Cold and flu viruses. The most common cause of this condition is the virus that causes the common cold. Bacteria. This is less common. Breathing in substances that irritate the lungs, including: Smoke from cigarettes and other forms of tobacco. Dust and pollen. Fumes from household cleaning products, gases, or burned fuel. Indoor or outdoor air pollution. What increases the risk? The following factors may make you more likely to develop this condition: A weak body's defense system, also called the immune system. A condition that affects your lungs and breathing, such as asthma. What are the signs or symptoms? Common symptoms of this condition include: Coughing. This may bring up clear, yellow, or green mucus from your lungs (sputum). Wheezing. Runny or  stuffy nose. Having too much mucus in your lungs (chest congestion). Shortness of breath. Aches and pains, including sore throat or chest. How is this diagnosed? This condition is usually diagnosed based on: Your symptoms and medical history. A physical exam. You may also have other tests, including tests to rule out other conditions, such as pneumonia. These tests include: A test of lung function. Test of a mucus sample to look for the presence of bacteria. Tests to check the oxygen level in your blood. Blood tests. Chest X-ray. How is this treated? Most cases of acute bronchitis clear up over time without treatment. Your health care provider may recommend: Drinking more fluids to help thin your mucus so it is easier to cough up. Taking inhaled medicine (inhaler) to improve air flow in and out of your lungs. Using a  vaporizer or a humidifier. These are machines that add water to the air to help you breathe better. Taking a medicine that thins mucus and clears congestion (expectorant). Taking a medicine that prevents or stops coughing (cough suppressant). It is not common to take an antibiotic medicine for this condition. Follow these instructions at home:  Take over-the-counter and prescription medicines only as told by your health care provider. Use an inhaler, vaporizer, or humidifier as told by your health care provider. Take two teaspoons (10 mL) of honey at bedtime to lessen coughing at night. Drink enough fluid to keep your urine pale yellow. Do not use any products that contain nicotine or tobacco. These products include cigarettes, chewing tobacco, and vaping devices, such as e-cigarettes. If you need help quitting, ask your health care provider. Get plenty of rest. Return to your normal activities as told by your health care provider. Ask your health care provider what activities are safe for you. Keep all follow-up visits. This is important. How is this prevented? To lower  your risk of getting this condition again: Wash your hands often with soap and water for at least 20 seconds. If soap and water are not available, use hand sanitizer. Avoid contact with people who have cold symptoms. Try not to touch your mouth, nose, or eyes with your hands. Avoid breathing in smoke or chemical fumes. Breathing smoke or chemical fumes will make your condition worse. Get the flu shot every year. Contact a health care provider if: Your symptoms do not improve after 2 weeks. You have trouble coughing up the mucus. Your cough keeps you awake at night. You have a fever. Get help right away if you: Cough up blood. Feel pain in your chest. Have severe shortness of breath. Faint or keep feeling like you are going to faint. Have a severe headache. Have a fever or chills that get worse. These symptoms may represent a serious problem that is an emergency. Do not wait to see if the symptoms will go away. Get medical help right away. Call your local emergency services (911 in the [place] Do not drive yourself to the hospital. Summary Acute bronchitis is inflammation of the main airways (bronchi) that come off the windpipe (trachea) in the lungs. The swelling causes the airways to get smaller and make more mucus than normal. Drinking more fluids can help thin your mucus so it is easier to cough up. Take over-the-counter and prescription medicines only as told by your health care provider. Do not use any products that contain nicotine or tobacco. These products include cigarettes, chewing tobacco, and vaping devices, such as e-cigarettes. If you need help quitting, ask your health care provider. Contact a health care provider if your symptoms do not improve after 2 weeks. This information is not intended to replace advice given to you by your health care provider. Make sure you discuss any questions you have with your health care provider. Document Revised: 11/07/2021 Document Reviewed:  11/28/2020 Elsevier Patient Education  [place].    If you have been instructed to have an in-person evaluation today at a local Urgent Care facility, please use the link below. It will take you to a list of all of our available [facility], including address, phone number and hours of operation. Please do not delay care.  [facility]  If you or a family member do not have a primary care provider, use the link below to schedule a visit and establish  care. When you choose a [facility] primary care physician or advanced practice provider, you gain a long-term partner in health. Find a Primary Care Provider  Learn more about [facility]'s in-office and virtual care options: [facility]

## 2022-06-15 NOTE — Therapy (Signed)
OUTPATIENT PHYSICAL THERAPY LOWER EXTREMITY TREATMENT   Patient Name: Laura [name] MRN: [id number] DOB:1970/10/02, 51 y.o., female Today's Date: 06/16/2022   PT End of Session - 06/16/22 1105     Visit Number 5    Number of Visits 16    Date for PT Re-Evaluation 07/20/22    PT Start Time 1105    PT Stop Time 0300    PT Time Calculation (min) 42 min    Activity Tolerance Patient tolerated treatment well              Past Medical History:  Diagnosis Date   Allergic rhinitis    Arthritis    Depression    Dyslipidemia    Dysmenorrhea    Family history of adverse reaction to anesthesia    mother slow to wake up   Gall bladder disease    GERD (gastroesophageal reflux disease)    Headache    History of kidney stones    Infertility, female    Obesity    Pneumonia    Polycystic ovary disease    PONV (postoperative nausea and vomiting)    Sebaceous hyperplasia     on tretinoin   Sleep apnea    cpap   WPW (Wolff-Parkinson-White syndrome)    hx of ablation 2012   Past Surgical History:  Procedure Laterality Date   CESAREAN SECTION     x 2   gastric bypass surgery      INCISIONAL HERNIA REPAIR N/A 07/01/2021   Procedure: INCISIONAL HERNIA REPAIR WITH MESH;  Surgeon: Kinsinger, Arta Bruce, MD;  Location: [facility];  Service: General;  Laterality: N/A;   LAPAROSCOPIC CHOLECYSTECTOMY     left foot surgery      SLEEVE GASTROPLASTY     SUPRAVENTRICULAR TACHYCARDIA ABLATION N/A 07/23/2011   Procedure: SUPRAVENTRICULAR TACHYCARDIA ABLATION;  Surgeon: Evans Lance, MD;  Location: [facility] CATH LAB;  Service: Cardiovascular;  Laterality: N/A;   transthoracic echcardiogram  09/15/2006   Patient Active Problem List   Diagnosis Date Noted   Irregular periods 04/30/2022   Right upper quadrant pain 08/26/2021   Diarrhea 08/26/2021   Nausea 08/26/2021   Incisional hernia 07/01/2021   H/O metal allergy 05/01/2020   Insomnia 05/01/2020   Iron deficiency 10/26/2019   Hx of  gastric bypass 06/29/2019   Migraine 04/28/2019   Situational depression 04/28/2019   Family history of colon cancer 01/19/2019   Hx of adenomatous colonic polyps 01/19/2019   Pain and swelling of right knee 12/20/2018   Intractable right heel pain 12/20/2018   Psychological factors affecting morbid obesity ([place]) 01/27/2018   Tendinitis of right hip flexor 12/14/2017   Protein deficiency ([place]) 12/03/2017   Bilateral hip pain 10/17/2016   Left ureteral stone 04/11/2016   Low back pain 09/05/2015   Uterine fibroid 08/15/2015   Menorrhagia with irregular cycle 08/14/2015   Radiculitis of left cervical region 08/09/2015   Post-traumatic osteoarthritis of left knee 02/15/2015   History of bariatric surgery 02/06/2015   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   GERD (gastroesophageal reflux disease) 06/19/2014   History of continuous positive airway pressure (CPAP) therapy at home 06/19/2014   OSA on CPAP 06/19/2014   Need for immunization against influenza 05/11/2014   Other fatigue 05/11/2014   Snoring 05/11/2014   Mixed hyperlipidemia 04/10/2014   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   Vitamin D deficiency 01/11/2014   Heart palpitations 12/07/2013   Menopausal symptom 05/18/2012   Leg swelling 05/18/2012   History of kidney stones 01/28/2012  Left flank pain 01/28/2012   Microscopic hematuria 01/28/2012   Sebaceous hyperplasia 10/24/2011   BACK PAIN, THORACIC REGION 07/25/2009   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/19/2006   OBESITY, NOS 05/19/2006   Major depressive disorder, recurrent episode ([place]) 05/19/2006   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    PCP: Beatrice Lecher, MD REFERRING PROVIDER: Aundria Mems, MD REFERRING DIAG: [phone] (ICD-10-CM) - Bilateral hip pain  THERAPY DIAG:  Pain in left hip  Pain in right hip  Muscle weakness (generalized)  Rationale for Evaluation and Treatment Rehabilitation  ONSET DATE: 05/19/22  SUBJECTIVE:    SUBJECTIVE STATEMENT: Still no hip pain. Now on antibiotic.  PERTINENT HISTORY: Long standing h/o bilateral hip pain after childbirth, weight loss s/p bariatric surgery 3 years ago, [employer] with cardiac ablation, hernia repair 2022  PAIN:  Are you having pain? Yes: NPRS scale: 0/10 Pain location: bilateral hip pain Pain description: sharp Aggravating factors: goes to an 8/10 when she wakes up at night and when first rising in the morning Relieving factors: pain meds , biofreeze Gets some pain into the sacrum and into the R groin and into the lateral thigh.  Pain wakes her up at night, walking aggravates pain. Patient notes R side is worse than L, and pain radiates into the groin and lateral thigh.  PRECAUTIONS: None  WEIGHT BEARING RESTRICTIONS No  PATIENT GOALS Strengthen hips to reduce pain  OBJECTIVE:   LOWER EXTREMITY MMT:  MMT Right eval Left eval  Hip flexion 3+/5 4/5  Hip extension 4-/5 4-/5  Hip abduction 4-/5 4-/5  Hip adduction    Hip internal rotation    Hip external rotation    Knee flexion 4/5 4/5  Knee extension 4/5 4/5  Ankle dorsiflexion 4/5 5/5  Ankle plantarflexion    Ankle inversion    Ankle eversion     (Blank rows = not tested)  [facility] Adult PT Treatment:                                  DATE: 06/16/22 Therapeutic Exercise:  Treadmill x 4 minutes up to 2.2 mph with UE support Wall slides  x 10 reps  Hip abduction at wall x 5 second holds x 5 reps bilaterally; also did with a small ball to make more challenging Forward fold using elevated mat into bilat hamstring stretch, hip adductor stretch, trunk rotation in forward flexion hands on 2 yoga blocs for ADDuctor stretch x 10 ea Anti-rotation x 10 reps R and L sides GTB, then Pallof press double blue bands x 10 ea Mini lunges x 10 reps Marching with 5 lb kettle bell overhead in one hand, # KB in the other hand away from side x 12 ea Supine:  bridges x 10 reps Prone: modified plank  on knees x 5 reps x 12 seconds Quadriped: primal push up x 10  _______________________________________              DATE: 06/11/22 Therapeutic Exercise: Treadmill x 4 minutes up to 2.2 mph with UE support Wall slides  x 10 reps  Hip abduction at wall x 5 second holds x 5 reps bilaterally Forward fold using elevated mat into bilat hamstring stretch, hip adductor stretch, trunk rotation in standing Anti-rotation x 10 reps R and L sides Mini lunges x 10 reps Marching with 5 lb kettle bell overhead Supine:  hip adductor stretch, piriformis stretch, bridges x 10 reps Prone: prone  press ups, modified plank on knees x 10 reps x 3 seconds Quadriped: cat/cow x 10 reps, primal push up, attempted heel sitting, but knees want to lock  [facility] Adult PT Treatment:                                                        DATE: 06/09/22  Therapeutic Exercise: Nustep level 5 x 4 minutes with bilat UEs and LEs Step ups x 12 reps with alternating marching without UE support Alternating lunges x 10 reps Squat with chair touch x 12 reps Wall slides  x 3 reps x 5 second holds---stopped due to knee pain Hip abduction at wall x 5 second holds x 5 reps bilaterally Supine:  trunk rotation x 10 reps, strap stretching for IT Band and adductor, and HS, bridges x 10 reps Prone: modified plank on knees x 10 reps x 3 seconds, quad stretch with strap, and hip flexor stretch off edge of mat  06/02/22: THEREX Nustep x 4 minutes level 6 LEs only Standing rocking anterior/posteriorly for weight shift and gentle stretching -- at steps with one LE supported on 8" surface Standing heel raises x 10 reps (hurt toe on vacation so held off on more reps) Isometric high lunge with holds x 30 seconds High lunge with trunk rotation x 5 reps Alternating lunges x 10 reps R and L sides Step up with alternating marching Attempted march with lunges, unable Squat with chair touch x 10 reps x 2 sets  Supine Trunk rotation x 10 reps IT  Band stretching supine with strap Hip adductor stretch x 1 rep with strap Bridges x 10 reps  HS stretch supine with strap x 1 rep x 30 seconds R and L sides  Prone Modified plank on knees and elbow x 3 reps x 5 second holds Hip flexor stretch with one leg off edge of mat  MANUAL STM lateral thigh and IT band with self care of using muscle roller to mobilize tissue   PATIENT EDUCATION:  Education details: HEP Person educated: Patient Education method: Consulting civil engineer, Systems developer, handout Education comprehension: verbalized understanding and returned demonstration  HOME EXERCISE PROGRAM: Access Code: [email or web address] URL: [email or web address] Date: 06/16/2022 Prepared by: Almyra Free  Exercises - Supine Piriformis Stretch Pulling Heel to Hip  - 2 x daily - 7 x weekly - 1 sets - 3 reps - 30 seconds hold - Prone Press Up  - 2 x daily - 7 x weekly - 1 sets - 10 reps - Plank on Knees  - 2 x daily - 7 x weekly - 1 sets - 10 reps - Seated Hamstring Stretch with Chair  - 2 x daily - 7 x weekly - 1 sets - 3 reps - 30 seconds hold - Squat with Chair Touch  - 2 x daily - 7 x weekly - 1 sets - 10 reps - Mini Lunge  - 2 x daily - 7 x weekly - 1 sets - 10 reps - Standing Isometric Hip Abduction with Knee at 90 at Wall  - 2 x daily - 7 x weekly - 1 sets - 10 reps - Side Lunge Adductor Stretch  - 2 x daily - 7 x weekly - 1 sets - 10 reps - Squatting Anti-Rotation Press  - 1 x daily - 3 x weekly - 3  sets - 10 reps - Standing Trunk Rotation with Resistance  - 1 x daily - 3 x weekly - 3 sets - 10 reps ASSESSMENT:  CLINICAL IMPRESSION: Laura [name] is progressing well with pain control, balance and strengthening. She was able to progress many exercises and use less UE support as well. Laura [name] continues to demonstrate potential for improvement and would benefit from continued skilled therapy to address impairments.    OBJECTIVE IMPAIRMENTS Abnormal gait, decreased activity tolerance, difficulty walking,  decreased strength, hypomobility, increased fascial restrictions, impaired flexibility, and pain.    GOALS: Goals reviewed with patient? Yes  SHORT TERM GOALS: Target date: 06/18/2022  The patient will be indep with HEP Baseline:no exercise Goal status: IN PROGRESS  2.  The patient will verbalize understanding of home work station set up and potentially moving from recliner to have more core engagement t/o the day. Baseline: working from recliner Goal status:IN PROGRESS  3.  The patient will report pain in the morning < or equal to 5/10 Baseline: 8/10 Goal status: IN PROGRESS  4.  The patient will improve R hip strength to 4/5. Baseline: 3+/5 Goal status: IN PROGRESS  LONG TERM GOALS: Target date: 07/16/2022   The patient will be indep with HEP progression. Baseline: no HEP Goal status: IN PROGRESS   2.  The patient will improve FOTO to 68%. Baseline: 56% Goal status: IN PROGRESS  3.  The patient will improve bilat hip abductor strength to 5/5. Baseline: 4-/5 Goal status:IN PROGRESS  4.  The patient will improve bilat hamstring length to -10 from full extension. Baseline: -20 / -18 Goal status:IN PROGRESS  5.  The patient will tolerate hip flexor stretch without pain R anterior hip. Baseline: + R hip thomas test Goal status:IN PROGRESS  6.  The patient will be indep with home walking program and community wellness. Baseline: Member at [employer] Goal status: IN PROGRESS   PLAN: PT FREQUENCY: 2x/week  PT DURATION: 8 weeks  PLANNED INTERVENTIONS: Therapeutic exercises, Therapeutic activity, Neuromuscular re-education, Balance training, Gait training, Patient/Family education, Self Care, Joint mobilization, Aquatic Therapy, Dry Needling, Electrical stimulation, Spinal mobilization, Moist heat, Taping, and Manual therapy  PLAN FOR NEXT SESSION:  check STGs, Continue to progress to tolerance-- standing strengthening focusing on SLS, squats, sidestepping, and core  stability.   Shemeika Starzyk, PT 06/16/2022, 11:48 AM

## 2022-06-16 ENCOUNTER — Ambulatory Visit: Payer: Managed Care, Other (non HMO) | Admitting: Physical Therapy

## 2022-06-16 ENCOUNTER — Encounter: Payer: Self-pay | Admitting: Physical Therapy

## 2022-06-16 DIAGNOSIS — M25552 Pain in left hip: Secondary | ICD-10-CM

## 2022-06-16 DIAGNOSIS — M6281 Muscle weakness (generalized): Secondary | ICD-10-CM

## 2022-06-16 DIAGNOSIS — M25551 Pain in right hip: Secondary | ICD-10-CM

## 2022-06-17 NOTE — Therapy (Signed)
OUTPATIENT PHYSICAL THERAPY LOWER EXTREMITY TREATMENT   Patient Name: Laura [name] MRN: [id number] DOB:11/22/1970, 51 y.o., female Today's Date: 06/18/2022   PT End of Session - 06/18/22 1022     Visit Number 6    Number of Visits 16    Date for PT Re-Evaluation 07/20/22    Authorization Type cigna    PT Start Time 8938    PT Stop Time 1058    PT Time Calculation (min) 35 min    Activity Tolerance Patient tolerated treatment well    Behavior During Therapy WFL for tasks assessed/performed              Past Medical History:  Diagnosis Date   Allergic rhinitis    Arthritis    Depression    Dyslipidemia    Dysmenorrhea    Family history of adverse reaction to anesthesia    mother slow to wake up   Gall bladder disease    GERD (gastroesophageal reflux disease)    Headache    History of kidney stones    Infertility, female    Obesity    Pneumonia    Polycystic ovary disease    PONV (postoperative nausea and vomiting)    Sebaceous hyperplasia     on tretinoin   Sleep apnea    cpap   WPW (Wolff-Parkinson-White syndrome)    hx of ablation 2012   Past Surgical History:  Procedure Laterality Date   CESAREAN SECTION     x 2   gastric bypass surgery      INCISIONAL HERNIA REPAIR N/A 07/01/2021   Procedure: INCISIONAL HERNIA REPAIR WITH MESH;  Surgeon: Kinsinger, Arta Bruce, MD;  Location: [facility];  Service: General;  Laterality: N/A;   LAPAROSCOPIC CHOLECYSTECTOMY     left foot surgery      SLEEVE GASTROPLASTY     SUPRAVENTRICULAR TACHYCARDIA ABLATION N/A 07/23/2011   Procedure: SUPRAVENTRICULAR TACHYCARDIA ABLATION;  Surgeon: Evans Lance, MD;  Location: [facility] CATH LAB;  Service: Cardiovascular;  Laterality: N/A;   transthoracic echcardiogram  09/15/2006   Patient Active Problem List   Diagnosis Date Noted   Irregular periods 04/30/2022   Right upper quadrant pain 08/26/2021   Diarrhea 08/26/2021   Nausea 08/26/2021   Incisional hernia 07/01/2021   H/O  metal allergy 05/01/2020   Insomnia 05/01/2020   Iron deficiency 10/26/2019   Hx of gastric bypass 06/29/2019   Migraine 04/28/2019   Situational depression 04/28/2019   Family history of colon cancer 01/19/2019   Hx of adenomatous colonic polyps 01/19/2019   Pain and swelling of right knee 12/20/2018   Intractable right heel pain 12/20/2018   Psychological factors affecting morbid obesity ([place]) 01/27/2018   Tendinitis of right hip flexor 12/14/2017   Protein deficiency ([place]) 12/03/2017   Bilateral hip pain 10/17/2016   Left ureteral stone 04/11/2016   Low back pain 09/05/2015   Uterine fibroid 08/15/2015   Menorrhagia with irregular cycle 08/14/2015   Radiculitis of left cervical region 08/09/2015   Post-traumatic osteoarthritis of left knee 02/15/2015   History of bariatric surgery 02/06/2015   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   GERD (gastroesophageal reflux disease) 06/19/2014   History of continuous positive airway pressure (CPAP) therapy at home 06/19/2014   OSA on CPAP 06/19/2014   Need for immunization against influenza 05/11/2014   Other fatigue 05/11/2014   Snoring 05/11/2014   Mixed hyperlipidemia 04/10/2014   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   Vitamin D deficiency 01/11/2014   Heart palpitations 12/07/2013  Menopausal symptom 05/18/2012   Leg swelling 05/18/2012   History of kidney stones 01/28/2012   Left flank pain 01/28/2012   Microscopic hematuria 01/28/2012   Sebaceous hyperplasia 10/24/2011   BACK PAIN, THORACIC REGION 07/25/2009   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/19/2006   OBESITY, NOS 05/19/2006   Major depressive disorder, recurrent episode ([place]) 05/19/2006   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    PCP: Beatrice Lecher, MD REFERRING PROVIDER: Aundria Mems, MD REFERRING DIAG: [phone] (ICD-10-CM) - Bilateral hip pain  THERAPY DIAG:  Pain in left hip  Pain in right hip  Muscle weakness (generalized)  Rationale for  Evaluation and Treatment Rehabilitation  ONSET DATE: 05/19/22  SUBJECTIVE:   SUBJECTIVE STATEMENT: Patient arrived late today reporting increased fatigue today. She was exhausted taking a shower last night and it took her 45 min. Went for a short walk with the dogs earlier in the day (about 3 houses). Also didn't take any Dayquil yesterday. Able to sleep without cold medicines last night.   PERTINENT HISTORY: Long standing h/o bilateral hip pain after childbirth, weight loss s/p bariatric surgery 3 years ago, [employer] with cardiac ablation, hernia repair 2022  PAIN:  Are you having pain? Yes: NPRS scale: 0/10 Pain location: bilateral hip pain Pain description: sharp Aggravating factors: goes to an 8/10 when she wakes up at night and when first rising in the morning Relieving factors: pain meds , biofreeze Gets some pain into the sacrum and into the R groin and into the lateral thigh.  Pain wakes her up at night, walking aggravates pain. Patient notes R side is worse than L, and pain radiates into the groin and lateral thigh.  PRECAUTIONS: None  WEIGHT BEARING RESTRICTIONS No  PATIENT GOALS Strengthen hips to reduce pain  OBJECTIVE:   LOWER EXTREMITY MMT:  MMT Right eval Left eval Right 06/18/22 Left 06/18/22  Hip flexion 3+/5 4/5 4+ 4+  Hip extension 4-/5 4-/5 4+ 4+  Hip abduction 4-/5 4-/5 5 4+  Hip adduction   5 5  Hip internal rotation      Hip external rotation      Knee flexion 4/5 4/_0 Knee extension 4/5 4/_1 Ankle dorsiflexion 4/5 5/_2 Ankle plantarflexion      Ankle inversion      Ankle eversion       (Blank rows = not tested)  [facility] Adult PT Treatment:                                  DATE: 06/18/22 Therapeutic Exercise:  Treadmill x 5 minutes up to 2.2 mph with UE support Prone hip ext x 5 ea; then alt x 10 ea Prone mule kick x 10 ea Plank on knees 3 x 23, 25, and 38 seconds Prone quad stretch Bil x 60 min Quadriped: primal push  up x 10 Wall slides  x 12 reps  Hip abduction at wall 1 x 30 second bilaterally with small ball  Anti-rotation x 10 reps R and L sides BTB, then Pallof press double blue bands x 10 ea Mini lunges x 10 reps Marching with 5 lb kettle bell overhead in one hand, # KB in the other hand away from side x 12 ea     [facility] Adult PT Treatment:  DATE: 06/16/22 Therapeutic Exercise:  Treadmill x 4 minutes up to 2.2 mph with UE support Wall slides  x 10 reps  Hip abduction at wall x 5 second holds x 5 reps bilaterally; also did with a small ball to make more challenging Forward fold using elevated mat into bilat hamstring stretch, hip adductor stretch, trunk rotation in forward flexion hands on 2 yoga blocs for ADDuctor stretch x 10 ea Anti-rotation x 10 reps R and L sides GTB, then Pallof press double blue bands x 10 ea Mini lunges x 10 reps Marching with 5 lb kettle bell overhead in one hand, # KB in the other hand away from side x 12 ea Supine:  bridges x 10 reps Prone: modified plank on knees x 5 reps x 12 seconds Quadriped: primal push up x 10  _______________________________________              DATE: 06/11/22 Therapeutic Exercise: Treadmill x 4 minutes up to 2.2 mph with UE support Wall slides  x 10 reps  Hip abduction at wall x 5 second holds x 5 reps bilaterally Forward fold using elevated mat into bilat hamstring stretch, hip adductor stretch, trunk rotation in standing Anti-rotation x 10 reps R and L sides Mini lunges x 10 reps Marching with 5 lb kettle bell overhead Supine:  hip adductor stretch, piriformis stretch, bridges x 10 reps Prone: prone press ups, modified plank on knees x 10 reps x 3 seconds Quadriped: cat/cow x 10 reps, primal push up, attempted heel sitting, but knees want to lock    PATIENT EDUCATION:  Education details: HEP Person educated: Patient Education method: Consulting civil engineer, Systems developer, handout Education comprehension: verbalized  understanding and returned demonstration  HOME EXERCISE PROGRAM: Access Code: [email or web address] URL: [email or web address] Date: 06/16/2022 Prepared by: Almyra Free  Exercises - Supine Piriformis Stretch Pulling Heel to Hip  - 2 x daily - 7 x weekly - 1 sets - 3 reps - 30 seconds hold - Prone Press Up  - 2 x daily - 7 x weekly - 1 sets - 10 reps - Plank on Knees  - 2 x daily - 7 x weekly - 1 sets - 10 reps - Seated Hamstring Stretch with Chair  - 2 x daily - 7 x weekly - 1 sets - 3 reps - 30 seconds hold - Squat with Chair Touch  - 2 x daily - 7 x weekly - 1 sets - 10 reps - Mini Lunge  - 2 x daily - 7 x weekly - 1 sets - 10 reps - Standing Isometric Hip Abduction with Knee at 90 at Wall  - 2 x daily - 7 x weekly - 1 sets - 10 reps - Side Lunge Adductor Stretch  - 2 x daily - 7 x weekly - 1 sets - 10 reps - Squatting Anti-Rotation Press  - 1 x daily - 3 x weekly - 3 sets - 10 reps - Standing Trunk Rotation with Resistance  - 1 x daily - 3 x weekly - 3 sets - 10 reps ASSESSMENT:  CLINICAL IMPRESSION: Orissa has met all STGs except for sitting at her desk more than recliner. She has made considerable improvements in LE strength. Core strength progressing as well. She is still challenged by overall endurance.   OBJECTIVE IMPAIRMENTS Abnormal gait, decreased activity tolerance, difficulty walking, decreased strength, hypomobility, increased fascial restrictions, impaired flexibility, and pain.    GOALS: Goals reviewed with patient? Yes  SHORT TERM GOALS: Target date: 06/18/2022  The patient  will be indep with HEP Baseline:no exercise Goal status: MET  2.  The patient will verbalize understanding of home work station set up and potentially moving from recliner to have more core engagement t/o the day. Baseline: working from Psychologist, occupational, working intermittently at desk for 2 hours Goal status:IN PROGRESS  3.  The patient will report pain in the morning < or equal to 5/10 Baseline:  8/10 Goal status: MET  4.  The patient will improve R hip strength to 4/5. Baseline: 3+/5 Goal status: MET  LONG TERM GOALS: Target date: 07/16/2022   The patient will be indep with HEP progression. Baseline: no HEP Goal status: IN PROGRESS   2.  The patient will improve FOTO to 68%. Baseline: 56% Goal status: IN PROGRESS  3.  The patient will improve bilat hip abductor strength to 5/5. Baseline: 4-/5 Goal status:IN PROGRESS  4.  The patient will improve bilat hamstring length to -10 from full extension. Baseline: -20 / -18 Goal status:IN PROGRESS  5.  The patient will tolerate hip flexor stretch without pain R anterior hip. Baseline: + R hip thomas test Goal status:IN PROGRESS  6.  The patient will be indep with home walking program and community wellness. Baseline: Member at [employer] Goal status: IN PROGRESS   PLAN: PT FREQUENCY: 2x/week  PT DURATION: 8 weeks  PLANNED INTERVENTIONS: Therapeutic exercises, Therapeutic activity, Neuromuscular re-education, Balance training, Gait training, Patient/Family education, Self Care, Joint mobilization, Aquatic Therapy, Dry Needling, Electrical stimulation, Spinal mobilization, Moist heat, Taping, and Manual therapy  PLAN FOR NEXT SESSION:  Continue to progress to tolerance-- standing strengthening focusing on SLS, squats, sidestepping, and core stability.   Briell Paulette, PT 06/18/2022, 11:00 AM

## 2022-06-18 ENCOUNTER — Ambulatory Visit: Payer: Managed Care, Other (non HMO) | Admitting: Physical Therapy

## 2022-06-18 ENCOUNTER — Encounter: Payer: Self-pay | Admitting: Physical Therapy

## 2022-06-18 DIAGNOSIS — M25552 Pain in left hip: Secondary | ICD-10-CM | POA: Diagnosis not present

## 2022-06-18 DIAGNOSIS — M6281 Muscle weakness (generalized): Secondary | ICD-10-CM

## 2022-06-18 DIAGNOSIS — M25551 Pain in right hip: Secondary | ICD-10-CM

## 2022-06-20 ENCOUNTER — Telehealth (INDEPENDENT_AMBULATORY_CARE_PROVIDER_SITE_OTHER): Payer: Managed Care, Other (non HMO) | Admitting: Family Medicine

## 2022-06-20 ENCOUNTER — Encounter: Payer: Self-pay | Admitting: Family Medicine

## 2022-06-20 DIAGNOSIS — J22 Unspecified acute lower respiratory infection: Secondary | ICD-10-CM | POA: Diagnosis not present

## 2022-06-20 MED ORDER — DOXYCYCLINE HYCLATE 100 MG PO TABS: 100.0000 mg | ORAL_TABLET | Freq: Two times a day (BID) | ORAL | 0 refills | Status: DC

## 2022-06-20 NOTE — Progress Notes (Signed)
    Virtual Visit via Video Note  I connected with Laura [name] on 06/20/22 at  1:00 PM EST by a video enabled telemedicine application and verified that I am speaking with the correct person using two identifiers.   I discussed the limitations of evaluation and management by telemedicine and the availability of in person appointments. The patient expressed understanding and agreed to proceed.  Patient location: at home Provider location: in office  Subjective:    CC:   Chief Complaint  Patient presents with   Cough   Fatigue    HPI:  Spoke with pt she reports that her sxs have been ongoing for the past 3 weeks. Started with a high fever.  No GI symptoms. She had a temperature of  99.9 last night. Cough is non productive, chest feels really heavy and when she tries to take a deep breath it causes her to cough.  Hard to lay flat. Wheezing has resolved, albuterol has helped. Dayquil, and tylenol, tessalon,and promethazine for her sxs. Headache off/on, just feels weak. Has had a round of steroid. Had a zpack as well and helped her sinus and ear symptoms. Felt like she was going to pass out in the shower at couple of nights ago. Finished her ABX 2 days.    She has taken a COVID test this was neg. She denies ay sick contacts.  + sick contacts.   Past medical history, Surgical history, Family history not pertinant except as noted below, Social history, Allergies, and medications have been entered into the medical record, reviewed, and corrections made.    Objective:    General: Speaking clearly in complete sentences without any shortness of breath.  Alert and oriented x3.  Normal judgment. No apparent acute distress. Voice is hoarse.     Impression and Recommendations:    Problem List Items Addressed This Visit   None Visit Diagnoses     Lower respiratory infection    -  Primary      Will treat with doxy.  Already completed zpack.  Return in person if not better on one week.  Consider CXR if not improving.  Make sure stay hydrated esp since feeling lightheaded.    No orders of the defined types were placed in this encounter.   Meds ordered this encounter  Medications   doxycycline (VIBRA-TABS) 100 MG tablet    Sig: Take 1 tablet (100 mg total) by mouth 2 (two) times daily.    Dispense:  14 tablet    Refill:  0     I discussed the assessment and treatment plan with the patient. The patient was provided an opportunity to ask questions and all were answered. The patient agreed with the plan and demonstrated an understanding of the instructions.   The patient was advised to call back or seek an in-person evaluation if the symptoms worsen or if the condition fails to improve as anticipated.   Beatrice Lecher, MD

## 2022-06-20 NOTE — Progress Notes (Signed)
Spoke with pt she reports that her sxs have been ongoing for the past 3 weeks.   She had a temperature of  99.9 last night. Cough is non productive, chest feels really heavy and when she tries to take a deep breath it causes her to cough.   Dayquil, and tylenol, tessalon,and promethazine for her sxs   Headache off/on, just feels weak   She has taken a COVID test this was neg. She denies ay sick contacts

## 2022-06-23 ENCOUNTER — Ambulatory Visit: Payer: Managed Care, Other (non HMO) | Admitting: Rehabilitative and Restorative Service Providers"

## 2022-06-23 ENCOUNTER — Encounter: Payer: Self-pay | Admitting: Rehabilitative and Restorative Service Providers"

## 2022-06-23 DIAGNOSIS — M25552 Pain in left hip: Secondary | ICD-10-CM

## 2022-06-23 DIAGNOSIS — M6281 Muscle weakness (generalized): Secondary | ICD-10-CM

## 2022-06-23 DIAGNOSIS — M25551 Pain in right hip: Secondary | ICD-10-CM

## 2022-06-23 NOTE — Therapy (Signed)
OUTPATIENT PHYSICAL THERAPY LOWER EXTREMITY TREATMENT   Patient Name: Laura [name] MRN: [id number] DOB:10-07-1970, 51 y.o., female Today's Date: 06/23/2022   PT End of Session - 06/23/22 1200     Visit Number 7    Number of Visits 16    Date for PT Re-Evaluation 07/20/22    Authorization Type cigna    PT Start Time 1200    PT Stop Time 1238    PT Time Calculation (min) 38 min    Activity Tolerance Patient tolerated treatment well    Behavior During Therapy WFL for tasks assessed/performed              Past Medical History:  Diagnosis Date   Allergic rhinitis    Arthritis    Depression    Dyslipidemia    Dysmenorrhea    Family history of adverse reaction to anesthesia    mother slow to wake up   Gall bladder disease    GERD (gastroesophageal reflux disease)    Headache    History of kidney stones    Infertility, female    Obesity    Pneumonia    Polycystic ovary disease    PONV (postoperative nausea and vomiting)    Sebaceous hyperplasia     on tretinoin   Sleep apnea    cpap   WPW (Wolff-Parkinson-White syndrome)    hx of ablation 2012   Past Surgical History:  Procedure Laterality Date   CESAREAN SECTION     x 2   gastric bypass surgery      INCISIONAL HERNIA REPAIR N/A 07/01/2021   Procedure: INCISIONAL HERNIA REPAIR WITH MESH;  Surgeon: Kinsinger, Arta Bruce, MD;  Location: [facility];  Service: General;  Laterality: N/A;   LAPAROSCOPIC CHOLECYSTECTOMY     left foot surgery      SLEEVE GASTROPLASTY     SUPRAVENTRICULAR TACHYCARDIA ABLATION N/A 07/23/2011   Procedure: SUPRAVENTRICULAR TACHYCARDIA ABLATION;  Surgeon: Evans Lance, MD;  Location: [facility] CATH [facility];  Service: Cardiovascular;  Laterality: N/A;   transthoracic echcardiogram  09/15/2006   Patient Active Problem List   Diagnosis Date Noted   Irregular periods 04/30/2022   Right upper quadrant pain 08/26/2021   Diarrhea 08/26/2021   Nausea 08/26/2021   Incisional hernia 07/01/2021   H/O  metal allergy 05/01/2020   Insomnia 05/01/2020   Iron deficiency 10/26/2019   Hx of gastric bypass 06/29/2019   Migraine 04/28/2019   Situational depression 04/28/2019   Family history of colon cancer 01/19/2019   Hx of adenomatous colonic polyps 01/19/2019   Pain and swelling of right knee 12/20/2018   Intractable right heel pain 12/20/2018   Psychological factors affecting morbid obesity ([place]) 01/27/2018   Tendinitis of right hip flexor 12/14/2017   Protein deficiency ([place]) 12/03/2017   Bilateral hip pain 10/17/2016   Left ureteral stone 04/11/2016   Low back pain 09/05/2015   Uterine fibroid 08/15/2015   Menorrhagia with irregular cycle 08/14/2015   Radiculitis of left cervical region 08/09/2015   Post-traumatic osteoarthritis of left knee 02/15/2015   History of bariatric surgery 02/06/2015   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   GERD (gastroesophageal reflux disease) 06/19/2014   History of continuous positive airway pressure (CPAP) therapy at home 06/19/2014   OSA on CPAP 06/19/2014   Need for immunization against influenza 05/11/2014   Other fatigue 05/11/2014   Snoring 05/11/2014   Mixed hyperlipidemia 04/10/2014   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   Vitamin D deficiency 01/11/2014   Heart palpitations 12/07/2013  Menopausal symptom 05/18/2012   Leg swelling 05/18/2012   History of kidney stones 01/28/2012   Left flank pain 01/28/2012   Microscopic hematuria 01/28/2012   Sebaceous hyperplasia 10/24/2011   BACK PAIN, THORACIC REGION 07/25/2009   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/19/2006   OBESITY, NOS 05/19/2006   Major depressive disorder, recurrent episode ([place]) 05/19/2006   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    PCP: Beatrice Lecher, MD REFERRING PROVIDER: Aundria Mems, MD REFERRING DIAG: [phone] (ICD-10-CM) - Bilateral hip pain  THERAPY DIAG:  Pain in right hip  Pain in left hip  Muscle weakness (generalized)  Rationale for  Evaluation and Treatment Rehabilitation  ONSET DATE: 05/19/22  SUBJECTIVE:   SUBJECTIVE STATEMENT: Patient arrived late today. She had increased lateral hip pain that began yesterday. "I had more energy this weekend, so I was doing more cleaning."   PERTINENT HISTORY: Long standing h/o bilateral hip pain after childbirth, weight loss s/p bariatric surgery 3 years ago, [employer] with cardiac ablation, hernia repair 2022  PAIN:  Are you having pain? Yes: NPRS scale: 2/10 Pain location: bilateral hip pain Pain description: sharp Aggravating factors: goes to an 8/10 when she wakes up at night and when first rising in the morning Relieving factors: pain meds , biofreeze Gets some pain into the sacrum and into the R groin and into the lateral thigh.  Pain wakes her up at night, walking aggravates pain. Patient notes R side is worse than L, and pain radiates into the groin and lateral thigh.  PRECAUTIONS: None  WEIGHT BEARING RESTRICTIONS No  PATIENT GOALS Strengthen hips to reduce pain  OBJECTIVE:   LOWER EXTREMITY MMT:  MMT Right eval Left eval Right 06/18/22 Left 06/18/22  Hip flexion 3+/5 4/5 4+ 4+  Hip extension 4-/5 4-/5 4+ 4+  Hip abduction 4-/5 4-/5 5 4+  Hip adduction   5 5  Hip internal rotation      Hip external rotation      Knee flexion 4/5 4/_0 Knee extension 4/5 4/_1 Ankle dorsiflexion 4/5 5/_2 Ankle plantarflexion      Ankle inversion      Ankle eversion       (Blank rows = not tested)   [facility] Adult PT Treatment:                                                DATE: 06/23/22 Therapeutic Exercise: Supine  ITB stretch with strap x 2 reps Groin stretch with strap x 2 reps ITB stretch in hooklying with rotation (feels in R groin) Prone Quad stretch R and L Hip flexor stretch with one leg on mat + one leg off + press up Standing Step ups x 10 reps  High lunges with trunk rotation x 5 reps Manual Therapy: Self STM on foam roller  with gluts, prone for quads, and lateral rolling IT bands Massage stick and STM IT Bands with significant pain R > L Self Care: STM and self massage using a rolling pin at home   [facility] Adult PT Treatment:                                  DATE: 06/18/22 Therapeutic Exercise:  Treadmill x 5 minutes up to 2.2 mph with  UE support Prone hip ext x 5 ea; then alt x 10 ea Prone mule kick x 10 ea Plank on knees 3 x 23, 25, and 38 seconds Prone quad stretch Bil x 60 min Quadriped: primal push up x 10 Wall slides  x 12 reps  Hip abduction at wall 1 x 30 second bilaterally with small ball  Anti-rotation x 10 reps R and L sides BTB, then Pallof press double blue bands x 10 ea Mini lunges x 10 reps Marching with 5 lb kettle bell overhead in one hand, # KB in the other hand away from side x 12 ea   [facility] Adult PT Treatment:                                  DATE: 06/16/22 Therapeutic Exercise:  Treadmill x 4 minutes up to 2.2 mph with UE support Wall slides  x 10 reps  Hip abduction at wall x 5 second holds x 5 reps bilaterally; also did with a small ball to make more challenging Forward fold using elevated mat into bilat hamstring stretch, hip adductor stretch, trunk rotation in forward flexion hands on 2 yoga blocs for ADDuctor stretch x 10 ea Anti-rotation x 10 reps R and L sides GTB, then Pallof press double blue bands x 10 ea Mini lunges x 10 reps Marching with 5 lb kettle bell overhead in one hand, # KB in the other hand away from side x 12 ea Supine:  bridges x 10 reps Prone: modified plank on knees x 5 reps x 12 seconds Quadriped: primal push up x 10  PATIENT EDUCATION:  Education details: HEP Person educated: Patient Education method: Consulting civil engineer, Systems developer, handout Education comprehension: verbalized understanding and returned demonstration  HOME EXERCISE PROGRAM: Access Code: [email or web address] URL: [email or web address] Date: 06/16/2022 Prepared by: Almyra Free  Exercises - Supine  Piriformis Stretch Pulling Heel to Hip  - 2 x daily - 7 x weekly - 1 sets - 3 reps - 30 seconds hold - Prone Press Up  - 2 x daily - 7 x weekly - 1 sets - 10 reps - Plank on Knees  - 2 x daily - 7 x weekly - 1 sets - 10 reps - Seated Hamstring Stretch with Chair  - 2 x daily - 7 x weekly - 1 sets - 3 reps - 30 seconds hold - Squat with Chair Touch  - 2 x daily - 7 x weekly - 1 sets - 10 reps - Mini Lunge  - 2 x daily - 7 x weekly - 1 sets - 10 reps - Standing Isometric Hip Abduction with Knee at 90 at Wall  - 2 x daily - 7 x weekly - 1 sets - 10 reps - Side Lunge Adductor Stretch  - 2 x daily - 7 x weekly - 1 sets - 10 reps - Squatting Anti-Rotation Press  - 1 x daily - 3 x weekly - 3 sets - 10 reps - Standing Trunk Rotation with Resistance  - 1 x daily - 3 x weekly - 3 sets - 10 reps ASSESSMENT:  CLINICAL IMPRESSION: The patient had increased pain and PT palpated trigger points along lateral thigh in ITB and quads.  PT worked on self massage and continues to add strengthening to tolerance.  OBJECTIVE IMPAIRMENTS Abnormal gait, decreased activity tolerance, difficulty walking, decreased strength, hypomobility, increased fascial restrictions, impaired flexibility, and pain.   GOALS: Goals reviewed with  patient? Yes  SHORT TERM GOALS: Target date: 06/18/2022  The patient will be indep with HEP Baseline:no exercise Goal status: MET  2.  The patient will verbalize understanding of home work station set up and potentially moving from recliner to have more core engagement t/o the day. Baseline: working from Psychologist, occupational, working intermittently at desk for 2 hours Goal status:IN PROGRESS  3.  The patient will report pain in the morning < or equal to 5/10 Baseline: 8/10 Goal status: MET  4.  The patient will improve R hip strength to 4/5. Baseline: 3+/5 Goal status: MET  LONG TERM GOALS: Target date: 07/16/2022   The patient will be indep with HEP progression. Baseline: no HEP Goal status:  IN PROGRESS   2.  The patient will improve FOTO to 68%. Baseline: 56% Goal status: IN PROGRESS  3.  The patient will improve bilat hip abductor strength to 5/5. Baseline: 4-/5 Goal status:IN PROGRESS  4.  The patient will improve bilat hamstring length to -10 from full extension. Baseline: -20 / -18 Goal status:IN PROGRESS  5.  The patient will tolerate hip flexor stretch without pain R anterior hip. Baseline: + R hip thomas test Goal status:IN PROGRESS  6.  The patient will be indep with home walking program and community wellness. Baseline: Member at [employer] Goal status: IN PROGRESS   PLAN: PT FREQUENCY: 2x/week  PT DURATION: 8 weeks  PLANNED INTERVENTIONS: Therapeutic exercises, Therapeutic activity, Neuromuscular re-education, Balance training, Gait training, Patient/Family education, Self Care, Joint mobilization, Aquatic Therapy, Dry Needling, Electrical stimulation, Spinal mobilization, Moist heat, Taping, and Manual therapy  PLAN FOR NEXT SESSION:  Continue to progress to tolerance-- standing strengthening focusing on SLS, squats, sidestepping, and core stability.   [place], [place] 06/23/2022, 1:03 PM

## 2022-06-25 ENCOUNTER — Encounter: Payer: Self-pay | Admitting: Rehabilitative and Restorative Service Providers"

## 2022-06-25 ENCOUNTER — Ambulatory Visit: Payer: Managed Care, Other (non HMO) | Admitting: Rehabilitative and Restorative Service Providers"

## 2022-06-25 DIAGNOSIS — M25552 Pain in left hip: Secondary | ICD-10-CM

## 2022-06-25 DIAGNOSIS — M25551 Pain in right hip: Secondary | ICD-10-CM

## 2022-06-25 DIAGNOSIS — M6281 Muscle weakness (generalized): Secondary | ICD-10-CM

## 2022-06-25 NOTE — Therapy (Signed)
OUTPATIENT PHYSICAL THERAPY LOWER EXTREMITY TREATMENT   Patient Name: Laura [name] MRN: [id number] DOB:1970-10-01, 51 y.o., female Today's Date: 06/25/2022   PT End of Session - 06/25/22 1109     Visit Number 8    Number of Visits 16    Date for PT Re-Evaluation 07/20/22    Authorization Type cigna    PT Start Time 1107    PT Stop Time 0086    PT Time Calculation (min) 38 min    Activity Tolerance Patient tolerated treatment well    Behavior During Therapy WFL for tasks assessed/performed              Past Medical History:  Diagnosis Date   Allergic rhinitis    Arthritis    Depression    Dyslipidemia    Dysmenorrhea    Family history of adverse reaction to anesthesia    mother slow to wake up   Gall bladder disease    GERD (gastroesophageal reflux disease)    Headache    History of kidney stones    Infertility, female    Obesity    Pneumonia    Polycystic ovary disease    PONV (postoperative nausea and vomiting)    Sebaceous hyperplasia     on tretinoin   Sleep apnea    cpap   WPW (Wolff-Parkinson-White syndrome)    hx of ablation 2012   Past Surgical History:  Procedure Laterality Date   CESAREAN SECTION     x 2   gastric bypass surgery      INCISIONAL HERNIA REPAIR N/A 07/01/2021   Procedure: INCISIONAL HERNIA REPAIR WITH MESH;  Surgeon: Kinsinger, Arta Bruce, MD;  Location: [facility];  Service: General;  Laterality: N/A;   LAPAROSCOPIC CHOLECYSTECTOMY     left foot surgery      SLEEVE GASTROPLASTY     SUPRAVENTRICULAR TACHYCARDIA ABLATION N/A 07/23/2011   Procedure: SUPRAVENTRICULAR TACHYCARDIA ABLATION;  Surgeon: Evans Lance, MD;  Location: [facility] CATH LAB;  Service: Cardiovascular;  Laterality: N/A;   transthoracic echcardiogram  09/15/2006   Patient Active Problem List   Diagnosis Date Noted   Irregular periods 04/30/2022   Right upper quadrant pain 08/26/2021   Diarrhea 08/26/2021   Nausea 08/26/2021   Incisional hernia 07/01/2021   H/O  metal allergy 05/01/2020   Insomnia 05/01/2020   Iron deficiency 10/26/2019   Hx of gastric bypass 06/29/2019   Migraine 04/28/2019   Situational depression 04/28/2019   Family history of colon cancer 01/19/2019   Hx of adenomatous colonic polyps 01/19/2019   Pain and swelling of right knee 12/20/2018   Intractable right heel pain 12/20/2018   Psychological factors affecting morbid obesity ([place]) 01/27/2018   Tendinitis of right hip flexor 12/14/2017   Protein deficiency ([place]) 12/03/2017   Bilateral hip pain 10/17/2016   Left ureteral stone 04/11/2016   Low back pain 09/05/2015   Uterine fibroid 08/15/2015   Menorrhagia with irregular cycle 08/14/2015   Radiculitis of left cervical region 08/09/2015   Post-traumatic osteoarthritis of left knee 02/15/2015   History of bariatric surgery 02/06/2015   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   GERD (gastroesophageal reflux disease) 06/19/2014   History of continuous positive airway pressure (CPAP) therapy at home 06/19/2014   OSA on CPAP 06/19/2014   Need for immunization against influenza 05/11/2014   Other fatigue 05/11/2014   Snoring 05/11/2014   Mixed hyperlipidemia 04/10/2014   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   Vitamin D deficiency 01/11/2014   Heart palpitations 12/07/2013  Menopausal symptom 05/18/2012   Leg swelling 05/18/2012   History of kidney stones 01/28/2012   Left flank pain 01/28/2012   Microscopic hematuria 01/28/2012   Sebaceous hyperplasia 10/24/2011   BACK PAIN, THORACIC REGION 07/25/2009   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/19/2006   OBESITY, NOS 05/19/2006   Major depressive disorder, recurrent episode (HCC) 05/19/2006   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    PCP: Catherine Metheney, MD REFERRING PROVIDER: Thomas Thekkekandam, MD REFERRING DIAG: M25.551,M25.552 (ICD-10-CM) - Bilateral hip pain  THERAPY DIAG:  Pain in right hip  Pain in left hip  Muscle weakness (generalized)  Rationale for  Evaluation and Treatment Rehabilitation  ONSET DATE: 05/19/22  SUBJECTIVE:   SUBJECTIVE STATEMENT: Patient arrived today reporting soreness in the lateral thighs with point tenderness over hips.  PERTINENT HISTORY: Long standing h/o bilateral hip pain after childbirth, weight loss s/p bariatric surgery 3 years ago, Wolf Parkinson's White with cardiac ablation, hernia repair 2022  PAIN:  Are you having pain? Yes: NPRS scale: 3/10 Pain location: bilateral hip pain Pain description: sharp Aggravating factors: goes to an 8/10 when she wakes up at night and when first rising in the morning Relieving factors: pain meds , biofreeze Gets some pain into the sacrum and into the R groin and into the lateral thigh.  Pain wakes her up at night, walking aggravates pain. Patient notes R side is worse than L, and pain radiates into the groin and lateral thigh.  PRECAUTIONS: None  WEIGHT BEARING RESTRICTIONS No  PATIENT GOALS Strengthen hips to reduce pain  OBJECTIVE:   LOWER EXTREMITY MMT:  MMT Right eval Left eval Right 06/18/22 Left 06/18/22  Hip flexion 3+/5 4/5 4+ 4+  Hip extension 4-/5 4-/5 4+ 4+  Hip abduction 4-/5 4-/5 5 4+  Hip adduction   5 5  Hip internal rotation      Hip external rotation      Knee flexion 4/5 4/5 5 5  Knee extension 4/5 4/5 5 5  Ankle dorsiflexion 4/5 5/5 5 5  Ankle plantarflexion      Ankle inversion      Ankle eversion       (Blank rows = not tested)   [facility] Adult PT Treatment:                                                DATE: 06/25/22 Therapeutic Exercise: Treadmill x 1.8 mph x 5 minutes for warm up Supine Double knee to chest with lateral rocking Piriformis stretch right and left sides Seated HS stretch Hip adductor stretch Prone Quad stretch with strap Standing Squats at wall x 12 reps Overhead KB lift #5 lbs with heel raises x 12 reps Antirotation x 10 reps each side green band Trunk rotation x 12 reps Green band Isometric high  lunge with anti-rotation green band Shoulder extension x 12 reps blue band Quadriped  Bird dog x 8 reps Primal push up x 5 reps Heel sitting stretch for lateral trunk Modalities Iontophoresis R and L greater trochanteric region with dexamethasome and 4 hour patch with handout provided and precautions reviewed.  [facility] Adult PT Treatment:                                                  DATE: 06/23/22 Therapeutic Exercise: Supine  ITB stretch with strap x 2 reps Groin stretch with strap x 2 reps ITB stretch in hooklying with rotation (feels in R groin) Prone Quad stretch R and L Hip flexor stretch with one leg on mat + one leg off + press up Standing Step ups x 10 reps  High lunges with trunk rotation x 5 reps Manual Therapy: Self STM on foam roller with gluts, prone for quads, and lateral rolling IT bands Massage stick and STM IT Bands with significant pain R > L Self Care: STM and self massage using a rolling pin at home   [facility] Adult PT Treatment:                                  DATE: 06/18/22 Therapeutic Exercise:  Treadmill x 5 minutes up to 2.2 mph with UE support Prone hip ext x 5 ea; then alt x 10 ea Prone mule kick x 10 ea Plank on knees 3 x 23, 25, and 38 seconds Prone quad stretch Bil x 60 min Quadriped: primal push up x 10 Wall slides  x 12 reps  Hip abduction at wall 1 x 30 second bilaterally with small ball  Anti-rotation x 10 reps R and L sides BTB, then Pallof press double blue bands x 10 ea Mini lunges x 10 reps Marching with 5 lb kettle bell overhead in one hand, # KB in the other hand away from side x 12 ea   PATIENT EDUCATION:  Education details: HEP Person educated: Patient Education method: Explanation, demo, handout Education comprehension: verbalized understanding and returned demonstration  HOME EXERCISE PROGRAM: Access Code: [email or web address] URL: [email or web address] Date: 06/16/2022 Prepared by: Julie  Exercises - Supine Piriformis  Stretch Pulling Heel to Hip  - 2 x daily - 7 x weekly - 1 sets - 3 reps - 30 seconds hold - Prone Press Up  - 2 x daily - 7 x weekly - 1 sets - 10 reps - Plank on Knees  - 2 x daily - 7 x weekly - 1 sets - 10 reps - Seated Hamstring Stretch with Chair  - 2 x daily - 7 x weekly - 1 sets - 3 reps - 30 seconds hold - Squat with Chair Touch  - 2 x daily - 7 x weekly - 1 sets - 10 reps - Mini Lunge  - 2 x daily - 7 x weekly - 1 sets - 10 reps - Standing Isometric Hip Abduction with Knee at 90 at Wall  - 2 x daily - 7 x weekly - 1 sets - 10 reps - Side Lunge Adductor Stretch  - 2 x daily - 7 x weekly - 1 sets - 10 reps - Squatting Anti-Rotation Press  - 1 x daily - 3 x weekly - 3 sets - 10 reps - Standing Trunk Rotation with Resistance  - 1 x daily - 3 x weekly - 3 sets - 10 reps ASSESSMENT:  CLINICAL IMPRESSION: The patient continues with lateral hip pain with tightness along IT Band. PT is continuing strengthening for Les, core, and added iontophoresis today to determine if any relief felt with use. Plan to continue to work to LTGs.   OBJECTIVE IMPAIRMENTS Abnormal gait, decreased activity tolerance, difficulty walking, decreased strength, hypomobility, increased fascial restrictions, impaired flexibility, and pain.   GOALS: Goals reviewed with patient? Yes  SHORT TERM GOALS: Target date: 06/18/2022    The patient will be indep with HEP Baseline:no exercise Goal status: MET  2.  The patient will verbalize understanding of home work station set up and potentially moving from recliner to have more core engagement t/o the day. Baseline: working from Psychologist, occupational, working intermittently at desk for 2 hours Goal status:IN PROGRESS  3.  The patient will report pain in the morning < or equal to 5/10 Baseline: 8/10 Goal status: MET  4.  The patient will improve R hip strength to 4/5. Baseline: 3+/5 Goal status: MET  LONG TERM GOALS: Target date: 07/16/2022   The patient will be indep with HEP  progression. Baseline: no HEP Goal status: IN PROGRESS   2.  The patient will improve FOTO to 68%. Baseline: 56% Goal status: IN PROGRESS  3.  The patient will improve bilat hip abductor strength to 5/5. Baseline: 4-/5 Goal status:IN PROGRESS  4.  The patient will improve bilat hamstring length to -10 from full extension. Baseline: -20 / -18 Goal status:IN PROGRESS  5.  The patient will tolerate hip flexor stretch without pain R anterior hip. Baseline: + R hip thomas test Goal status:IN PROGRESS  6.  The patient will be indep with home walking program and community wellness. Baseline: Member at [employer] Goal status: IN PROGRESS   PLAN: PT FREQUENCY: 2x/week  PT DURATION: 8 weeks  PLANNED INTERVENTIONS: Therapeutic exercises, Therapeutic activity, Neuromuscular re-education, Balance training, Gait training, Patient/Family education, Self Care, Joint mobilization, Aquatic Therapy, Dry Needling, Electrical stimulation, Spinal mobilization, Moist heat, Taping, and Manual therapy  PLAN FOR NEXT SESSION:  Continue to progress to tolerance-- standing strengthening focusing on SLS, squats, sidestepping, and core stability.   [place], PT 06/25/2022, 11:36 AM

## 2022-06-30 ENCOUNTER — Ambulatory Visit: Payer: Managed Care, Other (non HMO) | Admitting: Sports Medicine

## 2022-06-30 NOTE — Therapy (Incomplete)
OUTPATIENT PHYSICAL THERAPY LOWER EXTREMITY TREATMENT   Patient Name: Laura [name] MRN: [id number] DOB:1971-05-29, 51 y.o., female Today's Date: 06/30/2022      Past Medical History:  Diagnosis Date   Allergic rhinitis    Arthritis    Depression    Dyslipidemia    Dysmenorrhea    Family history of adverse reaction to anesthesia    mother slow to wake up   Gall bladder disease    GERD (gastroesophageal reflux disease)    Headache    History of kidney stones    Infertility, female    Obesity    Pneumonia    Polycystic ovary disease    PONV (postoperative nausea and vomiting)    Sebaceous hyperplasia     on tretinoin   Sleep apnea    cpap   WPW (Wolff-Parkinson-White syndrome)    hx of ablation 2012   Past Surgical History:  Procedure Laterality Date   CESAREAN SECTION     x 2   gastric bypass surgery      INCISIONAL HERNIA REPAIR N/A 07/01/2021   Procedure: INCISIONAL HERNIA REPAIR WITH MESH;  Surgeon: Kinsinger, Arta Bruce, MD;  Location: [facility];  Service: General;  Laterality: N/A;   LAPAROSCOPIC CHOLECYSTECTOMY     left foot surgery      SLEEVE GASTROPLASTY     SUPRAVENTRICULAR TACHYCARDIA ABLATION N/A 07/23/2011   Procedure: SUPRAVENTRICULAR TACHYCARDIA ABLATION;  Surgeon: Evans Lance, MD;  Location: [facility] CATH LAB;  Service: Cardiovascular;  Laterality: N/A;   transthoracic echcardiogram  09/15/2006   Patient Active Problem List   Diagnosis Date Noted   Irregular periods 04/30/2022   Right upper quadrant pain 08/26/2021   Diarrhea 08/26/2021   Nausea 08/26/2021   Incisional hernia 07/01/2021   H/O metal allergy 05/01/2020   Insomnia 05/01/2020   Iron deficiency 10/26/2019   Hx of gastric bypass 06/29/2019   Migraine 04/28/2019   Situational depression 04/28/2019   Family history of colon cancer 01/19/2019   Hx of adenomatous colonic polyps 01/19/2019   Pain and swelling of right knee 12/20/2018   Intractable right heel pain 12/20/2018    Psychological factors affecting morbid obesity ([place]) 01/27/2018   Tendinitis of right hip flexor 12/14/2017   Protein deficiency ([place]) 12/03/2017   Bilateral hip pain 10/17/2016   Left ureteral stone 04/11/2016   Low back pain 09/05/2015   Uterine fibroid 08/15/2015   Menorrhagia with irregular cycle 08/14/2015   Radiculitis of left cervical region 08/09/2015   Post-traumatic osteoarthritis of left knee 02/15/2015   History of bariatric surgery 02/06/2015   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   GERD (gastroesophageal reflux disease) 06/19/2014   History of continuous positive airway pressure (CPAP) therapy at home 06/19/2014   OSA on CPAP 06/19/2014   Need for immunization against influenza 05/11/2014   Other fatigue 05/11/2014   Snoring 05/11/2014   Mixed hyperlipidemia 04/10/2014   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   Vitamin D deficiency 01/11/2014   Heart palpitations 12/07/2013   Menopausal symptom 05/18/2012   Leg swelling 05/18/2012   History of kidney stones 01/28/2012   Left flank pain 01/28/2012   Microscopic hematuria 01/28/2012   Sebaceous hyperplasia 10/24/2011   BACK PAIN, THORACIC REGION 07/25/2009   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/19/2006   OBESITY, NOS 05/19/2006   Major depressive disorder, recurrent episode ([place]) 05/19/2006   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    PCP: Beatrice Lecher, MD REFERRING PROVIDER: Aundria Mems, MD REFERRING DIAG: [phone] (ICD-10-CM) - Bilateral hip pain  THERAPY DIAG:  No diagnosis found.  Rationale for Evaluation and Treatment Rehabilitation  ONSET DATE: 05/19/22  SUBJECTIVE:   SUBJECTIVE STATEMENT: ***  PERTINENT HISTORY: Long standing h/o bilateral hip pain after childbirth, weight loss s/p bariatric surgery 3 years ago, [employer] with cardiac ablation, hernia repair 2022  PAIN:  Are you having pain? Yes: NPRS scale: 3/10 Pain location: bilateral hip pain Pain description:  sharp Aggravating factors: goes to an 8/10 when she wakes up at night and when first rising in the morning Relieving factors: pain meds , biofreeze Gets some pain into the sacrum and into the R groin and into the lateral thigh.  Pain wakes her up at night, walking aggravates pain. Patient notes R side is worse than L, and pain radiates into the groin and lateral thigh.  PRECAUTIONS: None  WEIGHT BEARING RESTRICTIONS No  PATIENT GOALS Strengthen hips to reduce pain  OBJECTIVE:   LOWER EXTREMITY MMT:  MMT Right eval Left eval Right 06/18/22 Left 06/18/22  Hip flexion 3+/5 4/5 4+ 4+  Hip extension 4-/5 4-/5 4+ 4+  Hip abduction 4-/5 4-/5 5 4+  Hip adduction   5 5  Hip internal rotation      Hip external rotation      Knee flexion 4/5 4/_0 Knee extension 4/5 4/_1 Ankle dorsiflexion 4/5 5/_2 Ankle plantarflexion      Ankle inversion      Ankle eversion       (Blank rows = not tested)   [facility] Adult PT Treatment:                                                DATE: 07/01/22 Therapeutic Exercise: Treadmill x 1.8 mph x 5 minutes for warm up Supine Double knee to chest with lateral rocking Piriformis stretch right and left sides Seated HS stretch Hip adductor stretch Prone Quad stretch with strap Standing Squats at wall x 12 reps Overhead KB lift #5 lbs with heel raises x 12 reps Antirotation x 10 reps each side green band Trunk rotation x 12 reps Green band Isometric high lunge with anti-rotation green band Shoulder extension x 12 reps blue band Quadriped  Bird dog x 8 reps Primal push up x 5 reps Heel sitting stretch for lateral trunk Modalities Iontophoresis R and L greater trochanteric region with dexamethasome and 4 hour patch with handout provided and precautions reviewed.  [facility] Adult PT Treatment:                                                DATE: 06/25/22 Therapeutic Exercise: Treadmill x 1.8 mph x 5 minutes for warm up Supine Double knee to  chest with lateral rocking Piriformis stretch right and left sides Seated HS stretch Hip adductor stretch Prone Quad stretch with strap Standing Squats at wall x 12 reps Overhead KB lift #5 lbs with heel raises x 12 reps Antirotation x 10 reps each side green band Trunk rotation x 12 reps Green band Isometric high lunge with anti-rotation green band Shoulder extension x 12 reps blue band Quadriped  Bird dog x 8 reps Primal push up x 5 reps Heel sitting stretch for lateral trunk Modalities  Iontophoresis R and L greater trochanteric region with dexamethasome and 4 hour patch with handout provided and precautions reviewed.  [facility] Adult PT Treatment:                                                DATE: 06/23/22 Therapeutic Exercise: Supine  ITB stretch with strap x 2 reps Groin stretch with strap x 2 reps ITB stretch in hooklying with rotation (feels in R groin) Prone Quad stretch R and L Hip flexor stretch with one leg on mat + one leg off + press up Standing Step ups x 10 reps  High lunges with trunk rotation x 5 reps Manual Therapy: Self STM on foam roller with gluts, prone for quads, and lateral rolling IT bands Massage stick and STM IT Bands with significant pain R > L Self Care: STM and self massage using a rolling pin at home   [facility] Adult PT Treatment:                                  DATE: 06/18/22 Therapeutic Exercise:  Treadmill x 5 minutes up to 2.2 mph with UE support Prone hip ext x 5 ea; then alt x 10 ea Prone mule kick x 10 ea Plank on knees 3 x 23, 25, and 38 seconds Prone quad stretch Bil x 60 min Quadriped: primal push up x 10 Wall slides  x 12 reps  Hip abduction at wall 1 x 30 second bilaterally with small ball  Anti-rotation x 10 reps R and L sides BTB, then Pallof press double blue bands x 10 ea Mini lunges x 10 reps Marching with 5 lb kettle bell overhead in one hand, # KB in the other hand away from side x 12 ea   PATIENT EDUCATION:  Education  details: HEP Person educated: Patient Education method: Consulting civil engineer, Systems developer, handout Education comprehension: verbalized understanding and returned demonstration  HOME EXERCISE PROGRAM: Access Code: [email or web address] URL: [email or web address] Date: 06/16/2022 Prepared by: Almyra Free  Exercises - Supine Piriformis Stretch Pulling Heel to Hip  - 2 x daily - 7 x weekly - 1 sets - 3 reps - 30 seconds hold - Prone Press Up  - 2 x daily - 7 x weekly - 1 sets - 10 reps - Plank on Knees  - 2 x daily - 7 x weekly - 1 sets - 10 reps - Seated Hamstring Stretch with Chair  - 2 x daily - 7 x weekly - 1 sets - 3 reps - 30 seconds hold - Squat with Chair Touch  - 2 x daily - 7 x weekly - 1 sets - 10 reps - Mini Lunge  - 2 x daily - 7 x weekly - 1 sets - 10 reps - Standing Isometric Hip Abduction with Knee at 90 at Wall  - 2 x daily - 7 x weekly - 1 sets - 10 reps - Side Lunge Adductor Stretch  - 2 x daily - 7 x weekly - 1 sets - 10 reps - Squatting Anti-Rotation Press  - 1 x daily - 3 x weekly - 3 sets - 10 reps - Standing Trunk Rotation with Resistance  - 1 x daily - 3 x weekly - 3 sets - 10 reps ASSESSMENT:  CLINICAL IMPRESSION: The patient continues with  lateral hip pain with tightness along IT Band. PT is continuing strengthening for Les, core, and added iontophoresis today to determine if any relief felt with use. Plan to continue to work to [employer].   OBJECTIVE IMPAIRMENTS Abnormal gait, decreased activity tolerance, difficulty walking, decreased strength, hypomobility, increased fascial restrictions, impaired flexibility, and pain.   GOALS: Goals reviewed with patient? Yes  SHORT TERM GOALS: Target date: 06/18/2022  The patient will be indep with HEP Baseline:no exercise Goal status: MET  2.  The patient will verbalize understanding of home work station set up and potentially moving from recliner to have more core engagement t/o the day. Baseline: working from Psychologist, occupational, working  intermittently at desk for 2 hours Goal status:IN PROGRESS  3.  The patient will report pain in the morning < or equal to 5/10 Baseline: 8/10 Goal status: MET  4.  The patient will improve R hip strength to 4/5. Baseline: 3+/5 Goal status: MET  LONG TERM GOALS: Target date: 07/16/2022   The patient will be indep with HEP progression. Baseline: no HEP Goal status: IN PROGRESS   2.  The patient will improve FOTO to 68%. Baseline: 56% Goal status: IN PROGRESS  3.  The patient will improve bilat hip abductor strength to 5/5. Baseline: 4-/5 Goal status:IN PROGRESS  4.  The patient will improve bilat hamstring length to -10 from full extension. Baseline: -20 / -18 Goal status:IN PROGRESS  5.  The patient will tolerate hip flexor stretch without pain R anterior hip. Baseline: + R hip thomas test Goal status:IN PROGRESS  6.  The patient will be indep with home walking program and community wellness. Baseline: Member at [employer] Goal status: IN PROGRESS   PLAN: PT FREQUENCY: 2x/week  PT DURATION: 8 weeks  PLANNED INTERVENTIONS: Therapeutic exercises, Therapeutic activity, Neuromuscular re-education, Balance training, Gait training, Patient/Family education, Self Care, Joint mobilization, Aquatic Therapy, Dry Needling, Electrical stimulation, Spinal mobilization, Moist heat, Taping, and Manual therapy  PLAN FOR NEXT SESSION:  Continue to progress to tolerance-- standing strengthening focusing on SLS, squats, sidestepping, and core stability.   Jil Penland, PT 06/30/2022, 9:05 AM

## 2022-07-01 ENCOUNTER — Encounter: Payer: Managed Care, Other (non HMO) | Admitting: Physical Therapy

## 2022-07-07 ENCOUNTER — Encounter: Payer: Self-pay | Admitting: Rehabilitative and Restorative Service Providers"

## 2022-07-07 ENCOUNTER — Ambulatory Visit: Payer: Managed Care, Other (non HMO) | Admitting: Rehabilitative and Restorative Service Providers"

## 2022-07-07 DIAGNOSIS — M25552 Pain in left hip: Secondary | ICD-10-CM | POA: Diagnosis not present

## 2022-07-07 DIAGNOSIS — M6281 Muscle weakness (generalized): Secondary | ICD-10-CM

## 2022-07-07 DIAGNOSIS — M25551 Pain in right hip: Secondary | ICD-10-CM

## 2022-07-07 NOTE — Therapy (Signed)
OUTPATIENT PHYSICAL THERAPY LOWER EXTREMITY TREATMENT   Patient Name: Laura [name] MRN: [id number] DOB:04/23/1971, 51 y.o., female Today's Date: 07/07/2022   PT End of Session - 07/07/22 1021     Visit Number 9    Number of Visits 16    Date for PT Re-Evaluation 07/20/22    Authorization Type cigna    PT Start Time 1019    PT Stop Time 1100    PT Time Calculation ([name]) 41 [name]    Activity Tolerance Patient tolerated treatment well    Behavior During Therapy WFL for tasks assessed/performed              Past Medical History:  Diagnosis Date   Allergic rhinitis    Arthritis    Depression    Dyslipidemia    Dysmenorrhea    Family history of adverse reaction to anesthesia    mother slow to wake up   Gall bladder disease    GERD (gastroesophageal reflux disease)    Headache    History of kidney stones    Infertility, female    Obesity    Pneumonia    Polycystic ovary disease    PONV (postoperative nausea and vomiting)    Sebaceous hyperplasia     on tretinoin   Sleep apnea    cpap   WPW (Wolff-Parkinson-White syndrome)    hx of ablation 2012   Past Surgical History:  Procedure Laterality Date   CESAREAN SECTION     x 2   gastric bypass surgery      INCISIONAL HERNIA REPAIR N/A 07/01/2021   Procedure: INCISIONAL HERNIA REPAIR WITH MESH;  Surgeon: Kinsinger, Arta Bruce, MD;  Location: [facility];  Service: General;  Laterality: N/A;   LAPAROSCOPIC CHOLECYSTECTOMY     left foot surgery      SLEEVE GASTROPLASTY     SUPRAVENTRICULAR TACHYCARDIA ABLATION N/A 07/23/2011   Procedure: SUPRAVENTRICULAR TACHYCARDIA ABLATION;  Surgeon: Evans Lance, MD;  Location: [facility] CATH LAB;  Service: Cardiovascular;  Laterality: N/A;   transthoracic echcardiogram  09/15/2006   Patient Active Problem List   Diagnosis Date Noted   Irregular periods 04/30/2022   Right upper quadrant pain 08/26/2021   Diarrhea 08/26/2021   Nausea 08/26/2021   Incisional hernia 07/01/2021   H/O  metal allergy 05/01/2020   Insomnia 05/01/2020   Iron deficiency 10/26/2019   Hx of gastric bypass 06/29/2019   Migraine 04/28/2019   Situational depression 04/28/2019   Family history of colon cancer 01/19/2019   Hx of adenomatous colonic polyps 01/19/2019   Pain and swelling of right knee 12/20/2018   Intractable right heel pain 12/20/2018   Psychological factors affecting morbid obesity ([place]) 01/27/2018   Tendinitis of right hip flexor 12/14/2017   Protein deficiency ([place]) 12/03/2017   Bilateral hip pain 10/17/2016   Left ureteral stone 04/11/2016   Low back pain 09/05/2015   Uterine fibroid 08/15/2015   Menorrhagia with irregular cycle 08/14/2015   Radiculitis of left cervical region 08/09/2015   Post-traumatic osteoarthritis of left knee 02/15/2015   History of bariatric surgery 02/06/2015   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   GERD (gastroesophageal reflux disease) 06/19/2014   History of continuous positive airway pressure (CPAP) therapy at home 06/19/2014   OSA on CPAP 06/19/2014   Need for immunization against influenza 05/11/2014   Other fatigue 05/11/2014   Snoring 05/11/2014   Mixed hyperlipidemia 04/10/2014   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   Vitamin D deficiency 01/11/2014   Heart palpitations 12/07/2013  Menopausal symptom 05/18/2012   Leg swelling 05/18/2012   History of kidney stones 01/28/2012   Left flank pain 01/28/2012   Microscopic hematuria 01/28/2012   Sebaceous hyperplasia 10/24/2011   BACK PAIN, THORACIC REGION 07/25/2009   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/19/2006   OBESITY, NOS 05/19/2006   Major depressive disorder, recurrent episode ([place]) 05/19/2006   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    PCP: Beatrice Lecher, MD REFERRING PROVIDER: Aundria Mems, MD REFERRING DIAG: [phone] (ICD-10-CM) - Bilateral hip pain  THERAPY DIAG:  Pain in right hip  Pain in left hip  Muscle weakness (generalized)  Rationale for  Evaluation and Treatment Rehabilitation  ONSET DATE: 05/19/22  SUBJECTIVE:   SUBJECTIVE STATEMENT: The patient reports she is sore today. She had to cancel last week to go to [place] to see a friend whose mother was sick. She did her exercises, but feels worsening pain in bilat hips.   PERTINENT HISTORY: Long standing h/o bilateral hip pain after childbirth, weight loss s/p bariatric surgery 3 years ago, [employer] with cardiac ablation, hernia repair 2022  PAIN:  Are you having pain? Yes: NPRS scale: 6/10 Pain location: bilateral hip pain Pain description: sharp Aggravating factors: goes to an 8/10 when she wakes up at night and when first rising in the morning Relieving factors: pain meds , biofreeze Gets some pain into the sacrum and into the R groin and into the lateral thigh.  Pain wakes her up at night, walking aggravates pain. Patient notes R side is worse than L, and pain radiates into the groin and lateral thigh.  PRECAUTIONS: None  WEIGHT BEARING RESTRICTIONS No  PATIENT GOALS Strengthen hips to reduce pain  OBJECTIVE:   LOWER EXTREMITY MMT:  MMT Right eval Left eval Right 06/18/22 Left 06/18/22  Hip flexion 3+/5 4/5 4+ 4+  Hip extension 4-/5 4-/5 4+ 4+  Hip abduction 4-/5 4-/5 5 4+  Hip adduction   5 5  Hip internal rotation      Hip external rotation      Knee flexion 4/5 4/_0 Knee extension 4/5 4/_1 Ankle dorsiflexion 4/5 5/_2 Ankle plantarflexion      Ankle inversion      Ankle eversion       (Blank rows = not tested)   [facility] Adult PT Treatment:                                                DATE: 07/07/22 Therapeutic Exercise: Treadmill x 5 minutes slow warm up x 1.2 mph  Supine Lumbar rocking x 10 reps Piriformis stretching R and L sides (gets a pinching sensation R groin with this stretch) Isometric hip extension engaged with L leg hooklying R leg extended x 5 seconds x 5 reps and then repeat on opposite leg Sidelying Hip  abduction strengthening x 10 reps Manual Therapy: STM: bilat IT Band, glut med, piriformis Trigger Point Dry-Needling  Patient Consent Given: Yes Education handout provided: Yes Muscles treated: glut med bilat, IT Band, lateral HS  Treatment response/outcome: palpable lengthening Modalities: Iontophoresis R and L greater trochanteric region with dexamethasome and 4 hour patch with handout provided and precautions reviewed.  [facility] Adult PT Treatment:  DATE: 06/25/22 Therapeutic Exercise: Treadmill x 1.8 mph x 5 minutes for warm up Supine Double knee to chest with lateral rocking Piriformis stretch right and left sides Seated HS stretch Hip adductor stretch Prone Quad stretch with strap Standing Squats at wall x 12 reps Overhead KB lift #5 lbs with heel raises x 12 reps Antirotation x 10 reps each side green band Trunk rotation x 12 reps Green band Isometric high lunge with anti-rotation green band Shoulder extension x 12 reps blue band Quadriped  Bird dog x 8 reps Primal push up x 5 reps Heel sitting stretch for lateral trunk Modalities Iontophoresis R and L greater trochanteric region with dexamethasome and 4 hour patch with handout provided and precautions reviewed.  [facility] Adult PT Treatment:                                                DATE: 06/23/22 Therapeutic Exercise: Supine  ITB stretch with strap x 2 reps Groin stretch with strap x 2 reps ITB stretch in hooklying with rotation (feels in R groin) Prone Quad stretch R and L Hip flexor stretch with one leg on mat + one leg off + press up Standing Step ups x 10 reps  High lunges with trunk rotation x 5 reps Manual Therapy: Self STM on foam roller with gluts, prone for quads, and lateral rolling IT bands Massage stick and STM IT Bands with significant pain R > L Self Care: STM and self massage using a rolling pin at home  PATIENT EDUCATION:  Education details:  Personnel officer, DN handout Person educated: Patient Education method: Explanation, demo, handout Education comprehension: verbalized understanding and returned demonstration  HOME EXERCISE PROGRAM: Access Code: [email or web address] URL: [email or web address] Date: 06/16/2022 Prepared by: Almyra Free  Exercises - Supine Piriformis Stretch Pulling Heel to Hip  - 2 x daily - 7 x weekly - 1 sets - 3 reps - 30 seconds hold - Prone Press Up  - 2 x daily - 7 x weekly - 1 sets - 10 reps - Plank on Knees  - 2 x daily - 7 x weekly - 1 sets - 10 reps - Seated Hamstring Stretch with Chair  - 2 x daily - 7 x weekly - 1 sets - 3 reps - 30 seconds hold - Squat with Chair Touch  - 2 x daily - 7 x weekly - 1 sets - 10 reps - Mini Lunge  - 2 x daily - 7 x weekly - 1 sets - 10 reps - Standing Isometric Hip Abduction with Knee at 90 at Wall  - 2 x daily - 7 x weekly - 1 sets - 10 reps - Side Lunge Adductor Stretch  - 2 x daily - 7 x weekly - 1 sets - 10 reps - Squatting Anti-Rotation Press  - 1 x daily - 3 x weekly - 3 sets - 10 reps - Standing Trunk Rotation with Resistance  - 1 x daily - 3 x weekly - 3 sets - 10 reps  ASSESSMENT:  CLINICAL IMPRESSION: The patient felt some relief with iontophoresis. She has not been to PT x almost 2 weeks due to holiday and an unplanned trip to help a friend. Patient was progressing well with strengthening, but pain currently is flared. PT focused on reducing tightness and inflammation, and will continue to return to loading activities as  patient is able to tolerate.   OBJECTIVE IMPAIRMENTS Abnormal gait, decreased activity tolerance, difficulty walking, decreased strength, hypomobility, increased fascial restrictions, impaired flexibility, and pain.   GOALS: Goals reviewed with patient? Yes  SHORT TERM GOALS: Target date: 06/18/2022  The patient will be indep with HEP Baseline:no exercise Goal status: MET  2.  The patient will verbalize understanding of home work  station set up and potentially moving from recliner to have more core engagement t/o the day. Baseline: working from Psychologist, occupational, working intermittently at desk for 2 hours Goal status:IN PROGRESS  3.  The patient will report pain in the morning < or equal to 5/10 Baseline: 8/10 Goal status: MET  4.  The patient will improve R hip strength to 4/5. Baseline: 3+/5 Goal status: MET  LONG TERM GOALS: Target date: 07/16/2022   The patient will be indep with HEP progression. Baseline: no HEP Goal status: IN PROGRESS   2.  The patient will improve FOTO to 68%. Baseline: 56% Goal status: IN PROGRESS  3.  The patient will improve bilat hip abductor strength to 5/5. Baseline: 4-/5 Goal status:IN PROGRESS  4.  The patient will improve bilat hamstring length to -10 from full extension. Baseline: -20 / -18 Goal status:IN PROGRESS  5.  The patient will tolerate hip flexor stretch without pain R anterior hip. Baseline: + R hip thomas test Goal status:IN PROGRESS  6.  The patient will be indep with home walking program and community wellness. Baseline: Member at [employer] Goal status: IN PROGRESS   PLAN: PT FREQUENCY: 2x/week  PT DURATION: 8 weeks  PLANNED INTERVENTIONS: Therapeutic exercises, Therapeutic activity, Neuromuscular re-education, Balance training, Gait training, Patient/Family education, Self Care, Joint mobilization, Aquatic Therapy, Dry Needling, Electrical stimulation, Spinal mobilization, Moist heat, Taping, and Manual therapy  PLAN FOR NEXT SESSION:  Progress to tolerance-- standing strengthening focusing on SLS, squats, sidestepping, and core stability.   [place], PT 07/07/2022, 10:22 AM

## 2022-07-09 ENCOUNTER — Encounter: Payer: Self-pay | Admitting: Rehabilitative and Restorative Service Providers"

## 2022-07-09 ENCOUNTER — Ambulatory Visit: Payer: Managed Care, Other (non HMO) | Admitting: Rehabilitative and Restorative Service Providers"

## 2022-07-09 DIAGNOSIS — M25552 Pain in left hip: Secondary | ICD-10-CM

## 2022-07-09 DIAGNOSIS — M25551 Pain in right hip: Secondary | ICD-10-CM

## 2022-07-09 DIAGNOSIS — M6281 Muscle weakness (generalized): Secondary | ICD-10-CM

## 2022-07-09 NOTE — Therapy (Unsigned)
OUTPATIENT PHYSICAL THERAPY LOWER EXTREMITY TREATMENT   Patient Name: Laura [name] MRN: [id number] DOB:1970/12/08, 51 y.o., female Today's Date: 07/09/2022   PT End of Session - 07/09/22 1016     Visit Number 10    Number of Visits 16    Date for PT Re-Evaluation 07/20/22    Authorization Type cigna    PT Start Time 1018    PT Stop Time 1100    PT Time Calculation (min) 42 min    Activity Tolerance Patient tolerated treatment well    Behavior During Therapy WFL for tasks assessed/performed              Past Medical History:  Diagnosis Date   Allergic rhinitis    Arthritis    Depression    Dyslipidemia    Dysmenorrhea    Family history of adverse reaction to anesthesia    mother slow to wake up   Gall bladder disease    GERD (gastroesophageal reflux disease)    Headache    History of kidney stones    Infertility, female    Obesity    Pneumonia    Polycystic ovary disease    PONV (postoperative nausea and vomiting)    Sebaceous hyperplasia     on tretinoin   Sleep apnea    cpap   WPW (Wolff-Parkinson-White syndrome)    hx of ablation 2012   Past Surgical History:  Procedure Laterality Date   CESAREAN SECTION     x 2   gastric bypass surgery      INCISIONAL HERNIA REPAIR N/A 07/01/2021   Procedure: INCISIONAL HERNIA REPAIR WITH MESH;  Surgeon: Kinsinger, Arta Bruce, MD;  Location: [facility];  Service: General;  Laterality: N/A;   LAPAROSCOPIC CHOLECYSTECTOMY     left foot surgery      SLEEVE GASTROPLASTY     SUPRAVENTRICULAR TACHYCARDIA ABLATION N/A 07/23/2011   Procedure: SUPRAVENTRICULAR TACHYCARDIA ABLATION;  Surgeon: Evans Lance, MD;  Location: [facility] CATH LAB;  Service: Cardiovascular;  Laterality: N/A;   transthoracic echcardiogram  09/15/2006   Patient Active Problem List   Diagnosis Date Noted   Irregular periods 04/30/2022   Right upper quadrant pain 08/26/2021   Diarrhea 08/26/2021   Nausea 08/26/2021   Incisional hernia 07/01/2021   H/O  metal allergy 05/01/2020   Insomnia 05/01/2020   Iron deficiency 10/26/2019   Hx of gastric bypass 06/29/2019   Migraine 04/28/2019   Situational depression 04/28/2019   Family history of colon cancer 01/19/2019   Hx of adenomatous colonic polyps 01/19/2019   Pain and swelling of right knee 12/20/2018   Intractable right heel pain 12/20/2018   Psychological factors affecting morbid obesity ([place]) 01/27/2018   Tendinitis of right hip flexor 12/14/2017   Protein deficiency ([place]) 12/03/2017   Bilateral hip pain 10/17/2016   Left ureteral stone 04/11/2016   Low back pain 09/05/2015   Uterine fibroid 08/15/2015   Menorrhagia with irregular cycle 08/14/2015   Radiculitis of left cervical region 08/09/2015   Post-traumatic osteoarthritis of left knee 02/15/2015   History of bariatric surgery 02/06/2015   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   GERD (gastroesophageal reflux disease) 06/19/2014   History of continuous positive airway pressure (CPAP) therapy at home 06/19/2014   OSA on CPAP 06/19/2014   Need for immunization against influenza 05/11/2014   Other fatigue 05/11/2014   Snoring 05/11/2014   Mixed hyperlipidemia 04/10/2014   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   Vitamin D deficiency 01/11/2014   Heart palpitations 12/07/2013  Menopausal symptom 05/18/2012   Leg swelling 05/18/2012   History of kidney stones 01/28/2012   Left flank pain 01/28/2012   Microscopic hematuria 01/28/2012   Sebaceous hyperplasia 10/24/2011   BACK PAIN, THORACIC REGION 07/25/2009   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/19/2006   OBESITY, NOS 05/19/2006   Major depressive disorder, recurrent episode ([place]) 05/19/2006   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    PCP: Beatrice Lecher, MD REFERRING PROVIDER: Aundria Mems, MD REFERRING DIAG: [phone] (ICD-10-CM) - Bilateral hip pain  THERAPY DIAG:  Pain in right hip  Pain in left hip  Muscle weakness (generalized)  Rationale for  Evaluation and Treatment Rehabilitation  ONSET DATE: 05/19/22  SUBJECTIVE:   SUBJECTIVE STATEMENT: The patient is sore-- pain is worse in the left hip. "I couldn't lay down on my left side." Stretching hurts today too.  PERTINENT HISTORY: Long standing h/o bilateral hip pain after childbirth, weight loss s/p bariatric surgery 3 years ago, [employer] with cardiac ablation, hernia repair 2022  PAIN:  Are you having pain? Yes: NPRS scale: 3/10 Pain location: bilateral hip pain Pain description: sharp Aggravating factors: goes to an 8/10 when she wakes up at night and when first rising in the morning Relieving factors: pain meds , biofreeze Gets some pain into the sacrum and into the R groin and into the lateral thigh.  Pain wakes her up at night, walking aggravates pain. Patient notes R side is worse than L, and pain radiates into the groin and lateral thigh.  PRECAUTIONS: None  WEIGHT BEARING RESTRICTIONS No  PATIENT GOALS Strengthen hips to reduce pain  OBJECTIVE:   LOWER EXTREMITY MMT:  MMT Right eval Left eval Right 06/18/22 Left 06/18/22  Hip flexion 3+/5 4/5 4+ 4+  Hip extension 4-/5 4-/5 4+ 4+  Hip abduction 4-/5 4-/5 5 4+  Hip adduction   5 5  Hip internal rotation      Hip external rotation      Knee flexion 4/5 4/_0 Knee extension 4/5 4/_1 Ankle dorsiflexion 4/5 5/_2 Ankle plantarflexion      Ankle inversion      Ankle eversion       (Blank rows = not tested)   [facility] Adult PT Treatment:                                                DATE: 07/09/22 Therapeutic Exercise: Treadmill x 5 minutes x 2.1 mph and 2% incline Supine IT Band stretch dropping knees to the right and left P/ROM HS stretch Lumbar rocking with feet positioned wide for more hip stretch Happy baby yoga pose for hip release Quadriped Cat/cow x 10 reps Bird dog x 5 reps R and L alternating Primal push up x 5 reps x 5 sec holds Standing Lunging x 10 reps R and L  alternating near support surface Isometric high lunge with contralateral trunk rotation Single leg standing Single leg hip abduction isometrics into wall x 5 reps each side Manual Therapy: IASTM L IT Band and STM glut Modalities: Iontophoresis R and L greater trochanteric region with dexamethasome and 4 hour patch with handout provided and precautions reviewed. Self Care: Self massage technique rolling onto R and L hip during elbow propping  [facility] Adult PT Treatment:  DATE: 07/07/22 Therapeutic Exercise: Treadmill x 5 minutes slow warm up x 1.2 mph  Supine Lumbar rocking x 10 reps Piriformis stretching R and L sides (gets a pinching sensation R groin with this stretch) Isometric hip extension engaged with L leg hooklying R leg extended x 5 seconds x 5 reps and then repeat on opposite leg Sidelying Hip abduction strengthening x 10 reps Manual Therapy: STM: bilat IT Band, glut med, piriformis Trigger Point Dry-Needling  Patient Consent Given: Yes Education handout provided: Yes Muscles treated: glut med bilat, IT Band, lateral HS  Treatment response/outcome: palpable lengthening Modalities: Iontophoresis R and L greater trochanteric region with dexamethasome and 4 hour patch with handout provided and precautions reviewed.  [facility] Adult PT Treatment:                                                DATE: 06/25/22 Therapeutic Exercise: Treadmill x 1.8 mph x 5 minutes for warm up Supine Double knee to chest with lateral rocking Piriformis stretch right and left sides Seated HS stretch Hip adductor stretch Prone Quad stretch with strap Standing Squats at wall x 12 reps Overhead KB lift #5 lbs with heel raises x 12 reps Antirotation x 10 reps each side green band Trunk rotation x 12 reps Green band Isometric high lunge with anti-rotation green band Shoulder extension x 12 reps blue band Quadriped  Bird dog x 8 reps Primal push up x 5  reps Heel sitting stretch for lateral trunk Modalities Iontophoresis R and L greater trochanteric region with dexamethasome and 4 hour patch with handout provided and precautions reviewed.  PATIENT EDUCATION:  Education details: Personnel officer, DN handout Person educated: Patient Education method: Explanation, demo, handout Education comprehension: verbalized understanding and returned demonstration  HOME EXERCISE PROGRAM: Access Code: [email or web address] URL: [email or web address] Date: 06/16/2022 Prepared by: Almyra Free  Exercises - Supine Piriformis Stretch Pulling Heel to Hip  - 2 x daily - 7 x weekly - 1 sets - 3 reps - 30 seconds hold - Prone Press Up  - 2 x daily - 7 x weekly - 1 sets - 10 reps - Plank on Knees  - 2 x daily - 7 x weekly - 1 sets - 10 reps - Seated Hamstring Stretch with Chair  - 2 x daily - 7 x weekly - 1 sets - 3 reps - 30 seconds hold - Squat with Chair Touch  - 2 x daily - 7 x weekly - 1 sets - 10 reps - Mini Lunge  - 2 x daily - 7 x weekly - 1 sets - 10 reps - Standing Isometric Hip Abduction with Knee at 90 at Wall  - 2 x daily - 7 x weekly - 1 sets - 10 reps - Side Lunge Adductor Stretch  - 2 x daily - 7 x weekly - 1 sets - 10 reps - Squatting Anti-Rotation Press  - 1 x daily - 3 x weekly - 3 sets - 10 reps - Standing Trunk Rotation with Resistance  - 1 x daily - 3 x weekly - 3 sets - 10 reps  ASSESSMENT:  CLINICAL IMPRESSION: The patient felt some relief with iontophoresis. She has not been to PT x almost 2 weeks due to holiday and an unplanned trip to help a friend. Patient was progressing well with strengthening, but pain currently is flared. PT focused  on reducing tightness and inflammation, and will continue to return to loading activities as patient is able to tolerate.   OBJECTIVE IMPAIRMENTS Abnormal gait, decreased activity tolerance, difficulty walking, decreased strength, hypomobility, increased fascial restrictions, impaired flexibility, and  pain.   GOALS: Goals reviewed with patient? Yes  SHORT TERM GOALS: Target date: 06/18/2022  The patient will be indep with HEP Baseline:no exercise Goal status: MET  2.  The patient will verbalize understanding of home work station set up and potentially moving from recliner to have more core engagement t/o the day. Baseline: working from Psychologist, occupational, working intermittently at desk for 2 hours Goal status:IN PROGRESS  3.  The patient will report pain in the morning < or equal to 5/10 Baseline: 8/10 Goal status: MET  4.  The patient will improve R hip strength to 4/5. Baseline: 3+/5 Goal status: MET  LONG TERM GOALS: Target date: 07/16/2022   The patient will be indep with HEP progression. Baseline: no HEP Goal status: IN PROGRESS   2.  The patient will improve FOTO to 68%. Baseline: 56% Goal status: IN PROGRESS  3.  The patient will improve bilat hip abductor strength to 5/5. Baseline: 4-/5 Goal status:IN PROGRESS  4.  The patient will improve bilat hamstring length to -10 from full extension. Baseline: -20 / -18 Goal status:IN PROGRESS  5.  The patient will tolerate hip flexor stretch without pain R anterior hip. Baseline: + R hip thomas test Goal status:IN PROGRESS  6.  The patient will be indep with home walking program and community wellness. Baseline: Member at [employer] Goal status: IN PROGRESS   PLAN: PT FREQUENCY: 2x/week  PT DURATION: 8 weeks  PLANNED INTERVENTIONS: Therapeutic exercises, Therapeutic activity, Neuromuscular re-education, Balance training, Gait training, Patient/Family education, Self Care, Joint mobilization, Aquatic Therapy, Dry Needling, Electrical stimulation, Spinal mobilization, Moist heat, Taping, and Manual therapy  PLAN FOR NEXT SESSION:  Progress to tolerance-- standing strengthening focusing on SLS, squats, sidestepping, and core stability.   [place], PT 07/09/2022, 10:18 AM

## 2022-07-10 NOTE — Therapy (Signed)
OUTPATIENT PHYSICAL THERAPY LOWER EXTREMITY TREATMENT   Patient Name: Laura [name] MRN: [id number] DOB:[birth year], 51 y.o., female Today's Date: 07/14/2022   PT End of Session - 07/14/22 1407     Visit Number 11    Number of Visits 16    Date for PT Re-Evaluation 07/20/22    Authorization Type cigna    PT Start Time 0205    PT Stop Time 0250    PT Time Calculation (min) 45 min    Activity Tolerance Patient tolerated treatment well    Behavior During Therapy WFL for tasks assessed/performed              Past Medical History:  Diagnosis Date   Allergic rhinitis    Arthritis    Depression    Dyslipidemia    Dysmenorrhea    Family history of adverse reaction to anesthesia    mother slow to wake up   Gall bladder disease    GERD (gastroesophageal reflux disease)    Headache    History of kidney stones    Infertility, female    Obesity    Pneumonia    Polycystic ovary disease    PONV (postoperative nausea and vomiting)    Sebaceous hyperplasia     on tretinoin   Sleep apnea    cpap   WPW (Wolff-Parkinson-White syndrome)    hx of ablation 2012   Past Surgical History:  Procedure Laterality Date   CESAREAN SECTION     x 2   gastric bypass surgery      INCISIONAL HERNIA REPAIR N/A 07/01/2021   Procedure: INCISIONAL HERNIA REPAIR WITH MESH;  Surgeon: Kinsinger, Arta Bruce, MD;  Location: [facility];  Service: General;  Laterality: N/A;   LAPAROSCOPIC CHOLECYSTECTOMY     left foot surgery      SLEEVE GASTROPLASTY     SUPRAVENTRICULAR TACHYCARDIA ABLATION N/A 07/23/2011   Procedure: SUPRAVENTRICULAR TACHYCARDIA ABLATION;  Surgeon: Evans Lance, MD;  Location: [facility] CATH LAB;  Service: Cardiovascular;  Laterality: N/A;   transthoracic echcardiogram  09/15/2006   Patient Active Problem List   Diagnosis Date Noted   Irregular periods 04/30/2022   Right upper quadrant pain 08/26/2021   Diarrhea 08/26/2021   Nausea 08/26/2021   Incisional hernia 07/01/2021   H/O  metal allergy 05/01/2020   Insomnia 05/01/2020   Iron deficiency 10/26/2019   Hx of gastric bypass 06/29/2019   Migraine 04/28/2019   Situational depression 04/28/2019   Family history of colon cancer 01/19/2019   Hx of adenomatous colonic polyps 01/19/2019   Pain and swelling of right knee 12/20/2018   Intractable right heel pain 12/20/2018   Psychological factors affecting morbid obesity ([place]) 01/27/2018   Tendinitis of right hip flexor 12/14/2017   Protein deficiency ([place]) 12/03/2017   Bilateral hip pain 10/17/2016   Left ureteral stone 04/11/2016   Low back pain 09/05/2015   Uterine fibroid 08/15/2015   Menorrhagia with irregular cycle 08/14/2015   Radiculitis of left cervical region 08/09/2015   Post-traumatic osteoarthritis of left knee 02/15/2015   History of bariatric surgery 02/06/2015   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   GERD (gastroesophageal reflux disease) 06/19/2014   History of continuous positive airway pressure (CPAP) therapy at home 06/19/2014   OSA on CPAP 06/19/2014   Need for immunization against influenza 05/11/2014   Other fatigue 05/11/2014   Snoring 05/11/2014   Mixed hyperlipidemia 04/10/2014   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   Vitamin D deficiency 01/11/2014   Heart palpitations 12/07/2013  Menopausal symptom 05/18/2012   Leg swelling 05/18/2012   History of kidney stones 01/28/2012   Left flank pain 01/28/2012   Microscopic hematuria 01/28/2012   Sebaceous hyperplasia 10/24/2011   BACK PAIN, THORACIC REGION 07/25/2009   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/19/2006   OBESITY, NOS 05/19/2006   Major depressive disorder, recurrent episode ([place]) 05/19/2006   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    PCP: Beatrice Lecher, MD REFERRING PROVIDER: Aundria Mems, MD REFERRING DIAG: [phone] (ICD-10-CM) - Bilateral hip pain  THERAPY DIAG:  Pain in right hip  Pain in left hip  Muscle weakness (generalized)  Rationale for  Evaluation and Treatment Rehabilitation  ONSET DATE: 05/19/22  SUBJECTIVE:   SUBJECTIVE STATEMENT: I've been focusing on my ITBs with the roller and thera-gun.   PERTINENT HISTORY: Long standing h/o bilateral hip pain after childbirth, weight loss s/p bariatric surgery 3 years ago, [employer] with cardiac ablation, hernia repair 2022  PAIN:  Are you having pain? Yes: NPRS scale: 2/10 Pain location: bilateral hip pain Pain description: sharp Aggravating factors: goes to an 8/10 when she wakes up at night and when first rising in the morning Relieving factors: pain meds , biofreeze Gets some pain into the sacrum and into the R groin and into the lateral thigh.  Pain wakes her up at night, walking aggravates pain. Patient notes R side is worse than L, and pain radiates into the groin and lateral thigh.  PRECAUTIONS: None  WEIGHT BEARING RESTRICTIONS No  PATIENT GOALS Strengthen hips to reduce pain  OBJECTIVE:    07/14/22: Positive FABER R hip and FABIR bil hip  LOWER EXTREMITY MMT:  MMT Right eval Left eval Right 06/18/22 Left 06/18/22 Right  07/14/22 Left  07/14/22  Hip flexion 3+/5 4/5 4+ 4+ 4+ 5  Hip extension 4-/5 4-/5 4+ 4+ 5 4+  Hip abduction 4-/5 4-/5 5 4+ 5 4+  Hip adduction   5 5    Hip internal rotation        Hip external rotation        Knee flexion 4/5 4/_0 Knee extension 4/5 4/_1 Ankle dorsiflexion 4/5 5/_2 Ankle plantarflexion        Ankle inversion        Ankle eversion         (Blank rows = not tested)  [facility] Adult PT Treatment:                                                DATE: 07/14/22 Therapeutic Exercise: Treadmill x 5 minutes x 2.1 mph and 2% incline Bil HS stretch with strap and straight leg x 60 sec ea Thomas stretch x 30 sec R EOB hip flexor/quad stretch with strap x 30 sec Supine long leg hip IR for TFL stretch bil x 30 sec  Manual Therapy: Prone PA mobs with R hip in ER and flex (foot on mat, knee on  stool)  Supine long leg distraction to R hip with thrust pulls x 10 Passive R hip IR  Modalities Ionotophoresis with 1.0 mL 51m/ml Dexamethasone - 4-6 hour patch (868mmin) to bil greater trochanters (patch #4 of 6)     [facility] Adult PT Treatment:  DATE: 07/09/22 Therapeutic Exercise: Treadmill x 5 minutes x 2.1 mph and 2% incline Supine IT Band stretch dropping knees to the right and left P/ROM HS stretch Lumbar rocking with feet positioned wide for more hip stretch Happy baby yoga pose for hip release Quadriped Cat/cow x 10 reps Bird dog x 5 reps R and L alternating Primal push up x 5 reps x 5 sec holds Standing Lunging x 10 reps R and L alternating near support surface Isometric high lunge with contralateral trunk rotation x 5 reps Single leg standing R and L near support surface Single leg hip abduction isometrics into wall x 5 reps each side Manual Therapy: IASTM L IT Band and STM glut Modalities: Iontophoresis R and L greater trochanteric region with dexamethasome and 4 hour patch with handout provided and precautions reviewed. Self Care: Self massage technique rolling onto R and L hip during elbow propping   PATIENT EDUCATION:  Education details: Personnel officer, DN handout Person educated: Patient Education method: Explanation, demo, handout Education comprehension: verbalized understanding and returned demonstration  HOME EXERCISE PROGRAM: Access Code: [email or web address] URL: [email or web address] Date: 06/16/2022 Prepared by: Almyra Free  Exercises - Supine Piriformis Stretch Pulling Heel to Hip  - 2 x daily - 7 x weekly - 1 sets - 3 reps - 30 seconds hold - Prone Press Up  - 2 x daily - 7 x weekly - 1 sets - 10 reps - Plank on Knees  - 2 x daily - 7 x weekly - 1 sets - 10 reps - Seated Hamstring Stretch with Chair  - 2 x daily - 7 x weekly - 1 sets - 3 reps - 30 seconds hold - Squat with Chair Touch  - 2 x daily - 7  x weekly - 1 sets - 10 reps - Mini Lunge  - 2 x daily - 7 x weekly - 1 sets - 10 reps - Standing Isometric Hip Abduction with Knee at 90 at Wall  - 2 x daily - 7 x weekly - 1 sets - 10 reps - Side Lunge Adductor Stretch  - 2 x daily - 7 x weekly - 1 sets - 10 reps - Squatting Anti-Rotation Press  - 1 x daily - 3 x weekly - 3 sets - 10 reps - Standing Trunk Rotation with Resistance  - 1 x daily - 3 x weekly - 3 sets - 10 reps  ASSESSMENT:  CLINICAL IMPRESSION: Laura [name] is progressing with strengthening and pain control. She still reports pain at bil greater trochanters. She is getting relief with ionto and with IASTM at home. She still has flexibility deficits in bil HS and R hip flexor. She has pain bil with hip IR and R ER. Good response to R hip mobs today with no pain reported upon standing and walking at end of session. Aveleen continues to demonstrate potential for improvement and would benefit from continued skilled therapy to address impairments.    OBJECTIVE IMPAIRMENTS Abnormal gait, decreased activity tolerance, difficulty walking, decreased strength, hypomobility, increased fascial restrictions, impaired flexibility, and pain.   GOALS: Goals reviewed with patient? Yes  SHORT TERM GOALS: Target date: 06/18/2022  The patient will be indep with HEP Baseline:no exercise Goal status: MET  2.  The patient will verbalize understanding of home work station set up and potentially moving from recliner to have more core engagement t/o the day. Baseline: working from Psychologist, occupational, working intermittently at desk for 2 hours Goal status:IN PROGRESS 07/14/22  3.  The patient  will report pain in the morning < or equal to 5/10 Baseline: 8/10 Goal status: MET  4.  The patient will improve R hip strength to 4/5. Baseline: 3+/5 Goal status: MET  LONG TERM GOALS: Target date: 07/16/2022   The patient will be indep with HEP progression. Baseline: no HEP Goal status: IN PROGRESS   2.  The  patient will improve FOTO to 68%. Baseline: 56% Goal status: IN PROGRESS  3.  The patient will improve bilat hip abductor strength to 5/5. Baseline: 4-/5 Goal status:IN PROGRESS 07/14/22  4.  The patient will improve bilat hamstring length to -10 from full extension. Baseline: -20 / -18:  07/14/22: R -12 deg, L -22 deg Goal status:IN PROGRESS  5.  The patient will tolerate hip flexor stretch without pain R anterior hip. Baseline: + R hip thomas test 07/14/22 Goal status:IN PROGRESS  6.  The patient will be indep with home walking program and community wellness. Baseline: Member at [employer] Goal status: IN PROGRESS   PLAN: PT FREQUENCY: 2x/week  PT DURATION: 8 weeks  PLANNED INTERVENTIONS: Therapeutic exercises, Therapeutic activity, Neuromuscular re-education, Balance training, Gait training, Patient/Family education, Self Care, Joint mobilization, Aquatic Therapy, Dry Needling, Electrical stimulation, Spinal mobilization, Moist heat, Taping, and Manual therapy  PLAN FOR NEXT SESSION:  Continue with hip mobs as tolerated. Work on hip strength.  Progress to tolerance-- standing strengthening focusing on SLS, squats, sidestepping, and core stability.    Fields Oros, PT 07/14/2022, 3:11 PM

## 2022-07-14 ENCOUNTER — Encounter: Payer: Self-pay | Admitting: Physical Therapy

## 2022-07-14 ENCOUNTER — Ambulatory Visit: Payer: Managed Care, Other (non HMO) | Attending: Sports Medicine | Admitting: Physical Therapy

## 2022-07-14 DIAGNOSIS — M6281 Muscle weakness (generalized): Secondary | ICD-10-CM | POA: Diagnosis present

## 2022-07-14 DIAGNOSIS — M25552 Pain in left hip: Secondary | ICD-10-CM | POA: Diagnosis present

## 2022-07-14 DIAGNOSIS — M25551 Pain in right hip: Secondary | ICD-10-CM | POA: Diagnosis present

## 2022-07-16 ENCOUNTER — Ambulatory Visit (INDEPENDENT_AMBULATORY_CARE_PROVIDER_SITE_OTHER): Payer: Managed Care, Other (non HMO)

## 2022-07-16 ENCOUNTER — Ambulatory Visit (INDEPENDENT_AMBULATORY_CARE_PROVIDER_SITE_OTHER): Payer: Managed Care, Other (non HMO) | Admitting: Sports Medicine

## 2022-07-16 DIAGNOSIS — Z111 Encounter for screening for respiratory tuberculosis: Secondary | ICD-10-CM

## 2022-07-16 DIAGNOSIS — M25551 Pain in right hip: Secondary | ICD-10-CM | POA: Diagnosis not present

## 2022-07-16 DIAGNOSIS — M25552 Pain in left hip: Secondary | ICD-10-CM

## 2022-07-16 MED ORDER — TRIAMCINOLONE ACETONIDE 40 MG/ML IJ SUSP
80.0000 mg | Freq: Once | INTRAMUSCULAR | Status: AC
  Administered 2022-07-16: 80 mg via INTRAMUSCULAR

## 2022-07-16 NOTE — Therapy (Deleted)
OUTPATIENT PHYSICAL THERAPY LOWER EXTREMITY TREATMENT   Patient Name: Laura [name] MRN: [id number] DOB:[birth year], 51 y.o., female Today's Date: 07/14/2022   PT End of Session - 07/14/22 1407     Visit Number 11    Number of Visits 16    Date for PT Re-Evaluation 07/20/22    Authorization Type cigna    PT Start Time 0205    PT Stop Time 0250    PT Time Calculation (min) 45 min    Activity Tolerance Patient tolerated treatment well    Behavior During Therapy WFL for tasks assessed/performed              Past Medical History:  Diagnosis Date   Allergic rhinitis    Arthritis    Depression    Dyslipidemia    Dysmenorrhea    Family history of adverse reaction to anesthesia    mother slow to wake up   Gall bladder disease    GERD (gastroesophageal reflux disease)    Headache    History of kidney stones    Infertility, female    Obesity    Pneumonia    Polycystic ovary disease    PONV (postoperative nausea and vomiting)    Sebaceous hyperplasia     on tretinoin   Sleep apnea    cpap   WPW (Wolff-Parkinson-White syndrome)    hx of ablation 2012   Past Surgical History:  Procedure Laterality Date   CESAREAN SECTION     x 2   gastric bypass surgery      INCISIONAL HERNIA REPAIR N/A 07/01/2021   Procedure: INCISIONAL HERNIA REPAIR WITH MESH;  Surgeon: Kinsinger, Arta Bruce, MD;  Location: [facility];  Service: General;  Laterality: N/A;   LAPAROSCOPIC CHOLECYSTECTOMY     left foot surgery      SLEEVE GASTROPLASTY     SUPRAVENTRICULAR TACHYCARDIA ABLATION N/A 07/23/2011   Procedure: SUPRAVENTRICULAR TACHYCARDIA ABLATION;  Surgeon: Evans Lance, MD;  Location: [facility] CATH LAB;  Service: Cardiovascular;  Laterality: N/A;   transthoracic echcardiogram  09/15/2006   Patient Active Problem List   Diagnosis Date Noted   Irregular periods 04/30/2022   Right upper quadrant pain 08/26/2021   Diarrhea 08/26/2021   Nausea 08/26/2021   Incisional hernia 07/01/2021   H/O  metal allergy 05/01/2020   Insomnia 05/01/2020   Iron deficiency 10/26/2019   Hx of gastric bypass 06/29/2019   Migraine 04/28/2019   Situational depression 04/28/2019   Family history of colon cancer 01/19/2019   Hx of adenomatous colonic polyps 01/19/2019   Pain and swelling of right knee 12/20/2018   Intractable right heel pain 12/20/2018   Psychological factors affecting morbid obesity ([place]) 01/27/2018   Tendinitis of right hip flexor 12/14/2017   Protein deficiency ([place]) 12/03/2017   Bilateral hip pain 10/17/2016   Left ureteral stone 04/11/2016   Low back pain 09/05/2015   Uterine fibroid 08/15/2015   Menorrhagia with irregular cycle 08/14/2015   Radiculitis of left cervical region 08/09/2015   Post-traumatic osteoarthritis of left knee 02/15/2015   History of bariatric surgery 02/06/2015   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   GERD (gastroesophageal reflux disease) 06/19/2014   History of continuous positive airway pressure (CPAP) therapy at home 06/19/2014   OSA on CPAP 06/19/2014   Need for immunization against influenza 05/11/2014   Other fatigue 05/11/2014   Snoring 05/11/2014   Mixed hyperlipidemia 04/10/2014   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   Vitamin D deficiency 01/11/2014   Heart palpitations 12/07/2013  Menopausal symptom 05/18/2012   Leg swelling 05/18/2012   History of kidney stones 01/28/2012   Left flank pain 01/28/2012   Microscopic hematuria 01/28/2012   Sebaceous hyperplasia 10/24/2011   BACK PAIN, THORACIC REGION 07/25/2009   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/19/2006   OBESITY, NOS 05/19/2006   Major depressive disorder, recurrent episode ([place]) 05/19/2006   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    PCP: Beatrice Lecher, MD REFERRING PROVIDER: Aundria Mems, MD REFERRING DIAG: [phone] (ICD-10-CM) - Bilateral hip pain  THERAPY DIAG:  Pain in right hip  Pain in left hip  Muscle weakness (generalized)  Rationale for  Evaluation and Treatment Rehabilitation  ONSET DATE: 05/19/22  SUBJECTIVE:   SUBJECTIVE STATEMENT: ***  PERTINENT HISTORY: Long standing h/o bilateral hip pain after childbirth, weight loss s/p bariatric surgery 3 years ago, [employer] with cardiac ablation, hernia repair 2022  PAIN:  Are you having pain? Yes: NPRS scale: 2/10 Pain location: bilateral hip pain Pain description: sharp Aggravating factors: goes to an 8/10 when she wakes up at night and when first rising in the morning Relieving factors: pain meds , biofreeze Gets some pain into the sacrum and into the R groin and into the lateral thigh.  Pain wakes her up at night, walking aggravates pain. Patient notes R side is worse than L, and pain radiates into the groin and lateral thigh.  PRECAUTIONS: None  WEIGHT BEARING RESTRICTIONS No  PATIENT GOALS Strengthen hips to reduce pain  OBJECTIVE:    07/14/22: Positive FABER R hip and FABIR bil hip  LOWER EXTREMITY MMT:  MMT Right eval Left eval Right 06/18/22 Left 06/18/22 Right  07/14/22 Left  07/14/22  Hip flexion 3+/5 4/5 4+ 4+ 4+ 5  Hip extension 4-/5 4-/5 4+ 4+ 5 4+  Hip abduction 4-/5 4-/5 5 4+ 5 4+  Hip adduction   5 5    Hip internal rotation        Hip external rotation        Knee flexion 4/5 4/_0 Knee extension 4/5 4/_1 Ankle dorsiflexion 4/5 5/_2 Ankle plantarflexion        Ankle inversion        Ankle eversion         (Blank rows = not tested)   [facility] Adult PT Treatment:                                                DATE: 07/17/22 Therapeutic Exercise: Treadmill x 5 minutes x 2.1 mph and 2% incline Bil HS stretch with strap and straight leg x 60 sec ea Thomas stretch x 30 sec R EOB hip flexor/quad stretch with strap x 30 sec Supine long leg hip IR for TFL stretch bil x 30 sec  Manual Therapy: Prone PA mobs with R hip in ER and flex (foot on mat, knee on stool)  Supine long leg distraction to R hip with thrust pulls  x 10 Passive R hip IR  Modalities Ionotophoresis with 1.0 mL 67m/ml Dexamethasone - 4-6 hour patch (896mmin) to bil greater trochanters (patch #4 of 6)  [facility] Adult PT Treatment:  DATE: 07/14/22 Therapeutic Exercise: Treadmill x 5 minutes x 2.1 mph and 2% incline Bil HS stretch with strap and straight leg x 60 sec ea Thomas stretch x 30 sec R EOB hip flexor/quad stretch with strap x 30 sec Supine long leg hip IR for TFL stretch bil x 30 sec  Manual Therapy: Prone PA mobs with R hip in ER and flex (foot on mat, knee on stool)  Supine long leg distraction to R hip with thrust pulls x 10 Passive R hip IR  Modalities Ionotophoresis with 1.0 mL 62m/ml Dexamethasone - 4-6 hour patch (824mmin) to bil greater trochanters (patch #4 of 6)     [facility] Adult PT Treatment:                                                DATE: 07/09/22 Therapeutic Exercise: Treadmill x 5 minutes x 2.1 mph and 2% incline Supine IT Band stretch dropping knees to the right and left P/ROM HS stretch Lumbar rocking with feet positioned wide for more hip stretch Happy baby yoga pose for hip release Quadriped Cat/cow x 10 reps Bird dog x 5 reps R and L alternating Primal push up x 5 reps x 5 sec holds Standing Lunging x 10 reps R and L alternating near support surface Isometric high lunge with contralateral trunk rotation x 5 reps Single leg standing R and L near support surface Single leg hip abduction isometrics into wall x 5 reps each side Manual Therapy: IASTM L IT Band and STM glut Modalities: Iontophoresis R and L greater trochanteric region with dexamethasome and 4 hour patch with handout provided and precautions reviewed. Self Care: Self massage technique rolling onto R and L hip during elbow propping   PATIENT EDUCATION:  Education details: ioPersonnel officerDN handout Person educated: Patient Education method: Explanation, demo, handout Education  comprehension: verbalized understanding and returned demonstration  HOME EXERCISE PROGRAM: Access Code: [email or web address] URL: [email or web address] Date: 06/16/2022 Prepared by: JuAlmyra FreeExercises - Supine Piriformis Stretch Pulling Heel to Hip  - 2 x daily - 7 x weekly - 1 sets - 3 reps - 30 seconds hold - Prone Press Up  - 2 x daily - 7 x weekly - 1 sets - 10 reps - Plank on Knees  - 2 x daily - 7 x weekly - 1 sets - 10 reps - Seated Hamstring Stretch with Chair  - 2 x daily - 7 x weekly - 1 sets - 3 reps - 30 seconds hold - Squat with Chair Touch  - 2 x daily - 7 x weekly - 1 sets - 10 reps - Mini Lunge  - 2 x daily - 7 x weekly - 1 sets - 10 reps - Standing Isometric Hip Abduction with Knee at 90 at Wall  - 2 x daily - 7 x weekly - 1 sets - 10 reps - Side Lunge Adductor Stretch  - 2 x daily - 7 x weekly - 1 sets - 10 reps - Squatting Anti-Rotation Press  - 1 x daily - 3 x weekly - 3 sets - 10 reps - Standing Trunk Rotation with Resistance  - 1 x daily - 3 x weekly - 3 sets - 10 reps  ASSESSMENT:  CLINICAL IMPRESSION: LoKhadijah [name] progressing with strengthening and pain control. She still reports pain at bil greater trochanters. She  is getting relief with ionto and with IASTM at home. She still has flexibility deficits in bil HS and R hip flexor. She has pain bil with hip IR and R ER. Good response to R hip mobs today with no pain reported upon standing and walking at end of session. [place]aka continues to demonstrate potential for improvement and would benefit from continued skilled therapy to address impairments.    OBJECTIVE IMPAIRMENTS Abnormal gait, decreased activity tolerance, difficulty walking, decreased strength, hypomobility, increased fascial restrictions, impaired flexibility, and pain.   GOALS: Goals reviewed with patient? Yes  SHORT TERM GOALS: Target date: 06/18/2022  The patient will be indep with HEP Baseline:no exercise Goal status: MET  2.  The patient  will verbalize understanding of home work station set up and potentially moving from recliner to have more core engagement t/o the day. Baseline: working from Psychologist, occupational, working intermittently at desk for 2 hours Goal status:IN PROGRESS 07/14/22  3.  The patient will report pain in the morning < or equal to 5/10 Baseline: 8/10 Goal status: MET  4.  The patient will improve R hip strength to 4/5. Baseline: 3+/5 Goal status: MET  LONG TERM GOALS: Target date: 07/16/2022   The patient will be indep with HEP progression. Baseline: no HEP Goal status: IN PROGRESS   2.  The patient will improve FOTO to 68%. Baseline: 56% Goal status: IN PROGRESS  3.  The patient will improve bilat hip abductor strength to 5/5. Baseline: 4-/5 Goal status:IN PROGRESS 07/14/22  4.  The patient will improve bilat hamstring length to -10 from full extension. Baseline: -20 / -18:  07/14/22: R -12 deg, L -22 deg Goal status:IN PROGRESS  5.  The patient will tolerate hip flexor stretch without pain R anterior hip. Baseline: + R hip thomas test 07/14/22 Goal status:IN PROGRESS  6.  The patient will be indep with home walking program and community wellness. Baseline: Member at [employer] Goal status: IN PROGRESS   PLAN: PT FREQUENCY: 2x/week  PT DURATION: 8 weeks  PLANNED INTERVENTIONS: Therapeutic exercises, Therapeutic activity, Neuromuscular re-education, Balance training, Gait training, Patient/Family education, Self Care, Joint mobilization, Aquatic Therapy, Dry Needling, Electrical stimulation, Spinal mobilization, Moist heat, Taping, and Manual therapy  PLAN FOR NEXT SESSION:  Continue with hip mobs as tolerated. Work on hip strength.  Progress to tolerance-- standing strengthening focusing on SLS, squats, sidestepping, and core stability.    Shawnetta Lein, PT 07/14/2022, 3:11 PM

## 2022-07-16 NOTE — Assessment & Plan Note (Signed)
Persistent bilateral hip pain, she has done some physical therapy, hydrocodone, nothing is helping. Tenderness today's bilaterally over the greater trochanters, today we did bilateral greater trochanteric bursa/hip abductor injections. Return to see me in 6 weeks.

## 2022-07-16 NOTE — Progress Notes (Signed)
    Procedures performed today:    Procedure: Real-time Ultrasound Guided injection of the left greater trochanteric bursa Device: Samsung HS60  Verbal informed consent obtained.  Time-out conducted.  Noted no overlying erythema, induration, or other signs of local infection.  Skin prepped in a sterile fashion.  Local anesthesia: Topical Ethyl chloride.  With sterile technique and under real time ultrasound guidance: Noted normal-appearing bursae, 1 cc Kenalog 40, 2 cc lidocaine, 2 cc bupivacaine injected easily Completed without difficulty  Advised to call if fevers/chills, erythema, induration, drainage, or persistent bleeding.  Images permanently stored and available for review in PACS.  Impression: Technically successful ultrasound guided injection.   Procedure: Real-time Ultrasound Guided injection of the right greater trochanteric bursa Device: Samsung HS60  Verbal informed consent obtained.  Time-out conducted.  Noted no overlying erythema, induration, or other signs of local infection.  Skin prepped in a sterile fashion.  Local anesthesia: Topical Ethyl chloride.  With sterile technique and under real time ultrasound guidance: Noted normal-appearing bursae, 1 cc Kenalog 40, 2 cc lidocaine, 2 cc bupivacaine injected easily Completed without difficulty  Advised to call if fevers/chills, erythema, induration, drainage, or persistent bleeding.  Images permanently stored and available for review in PACS.  Impression: Technically successful ultrasound guided injection.  Independent interpretation of notes and tests performed by another provider:   None.  Brief History, Exam, Impression, and Recommendations:    Bilateral hip pain Persistent bilateral hip pain, she has done some physical therapy, hydrocodone, nothing is helping. Tenderness today's bilaterally over the greater trochanters, today we did bilateral greater trochanteric bursa/hip abductor injections. Return to see  me in 6 weeks.    ____________________________________________ Gwen Her. Dianah Field, M.D., ABFM., CAQSM., AME. Primary Care and Sports Medicine [facility]  Adjunct Professor of [employer] [facility] [facility]  Risk manager

## 2022-07-17 ENCOUNTER — Ambulatory Visit: Payer: Managed Care, Other (non HMO) | Admitting: Physical Therapy

## 2022-07-18 ENCOUNTER — Encounter: Payer: Self-pay | Admitting: Family Medicine

## 2022-07-18 DIAGNOSIS — R748 Abnormal levels of other serum enzymes: Secondary | ICD-10-CM

## 2022-07-18 DIAGNOSIS — R7989 Other specified abnormal findings of blood chemistry: Secondary | ICD-10-CM

## 2022-07-18 LAB — QUANTIFERON-TB GOLD PLUS
Mitogen-NIL: >10 IU/mL
NIL: 0.04 IU/mL
QuantiFERON-TB Gold Plus: NEGATIVE
TB1-NIL: 0 IU/mL
TB2-NIL: 0.01 IU/mL

## 2022-07-23 NOTE — Therapy (Addendum)
OUTPATIENT PHYSICAL THERAPY LOWER EXTREMITY TREATMENT   Patient Name: Laura [name] MRN: [id number] DOB:10/03/1970, 51 y.o., female Today's Date: 07/24/2022   PT End of Session - 07/24/22 1150     Visit Number 12    Number of Visits 16    Date for PT Re-Evaluation 08/21/22    Authorization Type cigna    PT Start Time 1150    PT Stop Time 1234    PT Time Calculation (min) 44 min    Activity Tolerance Patient tolerated treatment well    Behavior During Therapy WFL for tasks assessed/performed              Past Medical History:  Diagnosis Date   Allergic rhinitis    Arthritis    Depression    Dyslipidemia    Dysmenorrhea    Family history of adverse reaction to anesthesia    mother slow to wake up   Gall bladder disease    GERD (gastroesophageal reflux disease)    Headache    History of kidney stones    Infertility, female    Obesity    Pneumonia    Polycystic ovary disease    PONV (postoperative nausea and vomiting)    Sebaceous hyperplasia     on tretinoin   Sleep apnea    cpap   WPW (Wolff-Parkinson-White syndrome)    hx of ablation 2012   Past Surgical History:  Procedure Laterality Date   CESAREAN SECTION     x 2   gastric bypass surgery      INCISIONAL HERNIA REPAIR N/A 07/01/2021   Procedure: INCISIONAL HERNIA REPAIR WITH MESH;  Surgeon: Kinsinger, Arta Bruce, MD;  Location: [facility];  Service: General;  Laterality: N/A;   LAPAROSCOPIC CHOLECYSTECTOMY     left foot surgery      SLEEVE GASTROPLASTY     SUPRAVENTRICULAR TACHYCARDIA ABLATION N/A 07/23/2011   Procedure: SUPRAVENTRICULAR TACHYCARDIA ABLATION;  Surgeon: Evans Lance, MD;  Location: [facility] CATH LAB;  Service: Cardiovascular;  Laterality: N/A;   transthoracic echcardiogram  09/15/2006   Patient Active Problem List   Diagnosis Date Noted   Irregular periods 04/30/2022   Right upper quadrant pain 08/26/2021   Diarrhea 08/26/2021   Nausea 08/26/2021   Incisional hernia 07/01/2021   H/O  metal allergy 05/01/2020   Insomnia 05/01/2020   Iron deficiency 10/26/2019   Hx of gastric bypass 06/29/2019   Migraine 04/28/2019   Situational depression 04/28/2019   Family history of colon cancer 01/19/2019   Hx of adenomatous colonic polyps 01/19/2019   Pain and swelling of right knee 12/20/2018   Intractable right heel pain 12/20/2018   Psychological factors affecting morbid obesity ([place]) 01/27/2018   Tendinitis of right hip flexor 12/14/2017   Protein deficiency ([place]) 12/03/2017   Bilateral hip pain 10/17/2016   Left ureteral stone 04/11/2016   Low back pain 09/05/2015   Uterine fibroid 08/15/2015   Menorrhagia with irregular cycle 08/14/2015   Radiculitis of left cervical region 08/09/2015   Post-traumatic osteoarthritis of left knee 02/15/2015   History of bariatric surgery 02/06/2015   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   GERD (gastroesophageal reflux disease) 06/19/2014   History of continuous positive airway pressure (CPAP) therapy at home 06/19/2014   OSA on CPAP 06/19/2014   Need for immunization against influenza 05/11/2014   Other fatigue 05/11/2014   Snoring 05/11/2014   Mixed hyperlipidemia 04/10/2014   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   Vitamin D deficiency 01/11/2014   Heart palpitations 12/07/2013  Menopausal symptom 05/18/2012   Leg swelling 05/18/2012   History of kidney stones 01/28/2012   Left flank pain 01/28/2012   Microscopic hematuria 01/28/2012   Sebaceous hyperplasia 10/24/2011   BACK PAIN, THORACIC REGION 07/25/2009   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/19/2006   OBESITY, NOS 05/19/2006   Major depressive disorder, recurrent episode ([place]) 05/19/2006   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    PCP: Beatrice Lecher, MD REFERRING PROVIDER: Aundria Mems, MD REFERRING DIAG: [phone] (ICD-10-CM) - Bilateral hip pain  THERAPY DIAG:  Pain in right hip  Pain in left hip  Muscle weakness (generalized)  Rationale for  Evaluation and Treatment Rehabilitation  ONSET DATE: 05/19/22  SUBJECTIVE:   SUBJECTIVE STATEMENT: Left hip is sore but no pin point pain. The right hip still has pin point pin. Able to walk around [place] without AD. Hips very sore and rests required but I did it.  PERTINENT HISTORY: Long standing h/o bilateral hip pain after childbirth, weight loss s/p bariatric surgery 3 years ago, [employer] with cardiac ablation, hernia repair 2022  PAIN:  Are you having pain? Yes: NPRS scale: 3/10 Pain location: Right hip pain Pain description: sharp Aggravating factors: goes to an 8/10 when she wakes up at night and when first rising in the morning Relieving factors: nothing .  PRECAUTIONS: None  WEIGHT BEARING RESTRICTIONS No  PATIENT GOALS Strengthen hips to reduce pain  OBJECTIVE:    07/14/22: Positive FABER R hip and FABIR bil hip  LOWER EXTREMITY MMT:  MMT Right eval Left eval Right 06/18/22 Left 06/18/22 Right  07/14/22 Left  07/14/22 Right 07/24/22 Left 07/24/22  Hip flexion 3+/5 4/5 4+ 4+ 4+ _0 Hip extension 4-/5 4-/5 4+ 4+ 5 4+ 5 4+  Hip abduction 4-/5 4-/5 5 4+ 5 4+ 5 5-  Hip adduction   5 5      Hip internal rotation          Hip external rotation          Knee flexion 4/5 4/_1 Knee extension 4/5 4/_2 Ankle dorsiflexion 4/5 5/_3 Ankle plantarflexion          Ankle inversion          Ankle eversion           (Blank rows = not tested)   [facility] Adult PT Treatment:                                                DATE: 07/24/22 Therapeutic Exercise: Treadmill x 5 minutes x 2.2 mph and 2% incline Seated hip flexor stretch 3 x 30 sec Supine long leg hip IR for TFL stretch R x 30 sec R ITB with strap 2x60 sec  Therapeutic Activities: MMT, ROM and FOTO assessed for re-cert  Manual Therapy: Supine long leg distraction to R hip  Hooklying R hip distraction with belt Passive R hip IR (no pain after  mobs)  Modalities Ionotophoresis with 1.0 mL 40m/ml Dexamethasone - 4-6 hour patch (876mmin) to bil greater trochanters (patch #5 of 6)    [facility] Adult PT Treatment:  DATE: 07/14/22 Therapeutic Exercise: Treadmill x 7 minutes x 2.1 mph and 2% incline (goals assessed and current status) Bil HS stretch with strap and straight leg x 60 sec ea Thomas stretch x 30 sec R EOB hip flexor/quad stretch with strap x 30 sec Supine long leg hip IR for TFL stretch bil x 30 sec  Manual Therapy: Prone PA mobs with R hip in ER and flex (foot on mat, knee on stool)  Supine long leg distraction to R hip with thrust pulls x 10 Passive R hip IR  Modalities Ionotophoresis with 1.0 mL 37m/ml Dexamethasone - 4-6 hour patch (82mmin) to bil greater trochanters (patch #4 of 6)     PATIENT EDUCATION:  Education details: ioPersonnel officerDN handout Person educated: Patient Education method: Explanation, demo, handout Education comprehension: verbalized understanding and returned demonstration  HOME EXERCISE PROGRAM: Access Code: [email or web address] URL: [email or web address] Date: 06/16/2022 Prepared by: JuAlmyra FreeExercises - Supine Piriformis Stretch Pulling Heel to Hip  - 2 x daily - 7 x weekly - 1 sets - 3 reps - 30 seconds hold - Prone Press Up  - 2 x daily - 7 x weekly - 1 sets - 10 reps - Plank on Knees  - 2 x daily - 7 x weekly - 1 sets - 10 reps - Seated Hamstring Stretch with Chair  - 2 x daily - 7 x weekly - 1 sets - 3 reps - 30 seconds hold - Squat with Chair Touch  - 2 x daily - 7 x weekly - 1 sets - 10 reps - Mini Lunge  - 2 x daily - 7 x weekly - 1 sets - 10 reps - Standing Isometric Hip Abduction with Knee at 90 at Wall  - 2 x daily - 7 x weekly - 1 sets - 10 reps - Side Lunge Adductor Stretch  - 2 x daily - 7 x weekly - 1 sets - 10 reps - Squatting Anti-Rotation Press  - 1 x daily - 3 x weekly - 3 sets - 10 reps - Standing Trunk Rotation  with Resistance  - 1 x daily - 3 x weekly - 3 sets - 10 reps  ASSESSMENT:  CLINICAL IMPRESSION: LoChi Woodhams progressing well with her LTGs. She reports no pain in the left hip since her injection. The right hip still has pin point pain. She was able to walk [place] and with rests. She continues to have pain in the right hip joint with IR, but this improved significantly today with hip distraction mobs. She is compliant with her HEP and her hamstrings flexibility has improved considerably since the last assessment. Right hip strength is normal, but she still has some weakness in the left LE. LoKeilaontinues to demonstrate potential for improvement and would benefit from continued skilled therapy to address impairments. Plan to continue for 2 more weeks, then place patient on hold until her next f/u with Dr. ThDianah Fieldn mid January.   OBJECTIVE IMPAIRMENTS Abnormal gait, decreased activity tolerance, difficulty walking, decreased strength, hypomobility, increased fascial restrictions, impaired flexibility, and pain.   GOALS: Goals reviewed with patient? Yes  SHORT TERM GOALS: Target date: 06/18/2022  The patient will be indep with HEP Baseline:no exercise Goal status: MET  2.  The patient will verbalize understanding of home work station set up and potentially moving from recliner to have more core engagement t/o the day. 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  The patient will report pain in the morning < or equal to 5/10 Baseline: "It's less than 5" Goal status: MET 07/24/22  4.  The patient will improve R hip strength to 4/5. Baseline: 3+/5 Goal status: MET  LONG TERM GOALS: Target date: 07/16/2022 extended to 08/21/22  The patient will be indep with HEP progression. Baseline: no HEP Goal status: IN PROGRESS   2.  The patient will improve FOTO to 68%. Baseline: 56% at eval; 55% on  07/24/22 Goal status: IN PROGRESS  3.  The patient will improve bilat hip abductor strength to 5/5. Baseline: Met except Left hip ABD 5-/5, ext 4+/5 Goal status:IN PROGRESS 07/24/22  4.  The patient will improve bilat hamstring length to -10 from full extension. Baseline: -20 / -18:  07/14/22: R -12 deg, L -22 deg, 07/24/22 R = 0 and L -12 Goal status:IN PROGRESS  5.  The patient will tolerate hip flexor stretch without pain R anterior hip. Baseline: + R hip thomas test 07/14/22 Goal status: MET 07/24/22  6.  The patient will be indep with home walking program and community wellness. Baseline:Walking dogs daily, but planning to increase distance and frequency Goal status: IN PROGRESS   PLAN: PT FREQUENCY: 2x/week  PT DURATION: 8 weeks  PLANNED INTERVENTIONS: Therapeutic exercises, Therapeutic activity, Neuromuscular re-education, Balance training, Gait training, Patient/Family education, Self Care, Joint mobilization, Aquatic Therapy, Dry Needling, Electrical stimulation, Spinal mobilization, Moist heat, Taping, and Manual therapy  PLAN FOR NEXT SESSION:  Continue with hip mobs as tolerated. Work on hip strength and core..name   Jarreau Callanan, PT 07/24/2022, 12:59 PM

## 2022-07-24 ENCOUNTER — Ambulatory Visit: Payer: Managed Care, Other (non HMO) | Admitting: Physical Therapy

## 2022-07-24 ENCOUNTER — Encounter: Payer: Self-pay | Admitting: Physical Therapy

## 2022-07-24 DIAGNOSIS — M6281 Muscle weakness (generalized): Secondary | ICD-10-CM

## 2022-07-24 DIAGNOSIS — M25551 Pain in right hip: Secondary | ICD-10-CM | POA: Diagnosis not present

## 2022-07-24 DIAGNOSIS — M25552 Pain in left hip: Secondary | ICD-10-CM

## 2022-07-28 ENCOUNTER — Ambulatory Visit: Payer: Managed Care, Other (non HMO)

## 2022-07-28 DIAGNOSIS — M25551 Pain in right hip: Secondary | ICD-10-CM

## 2022-07-28 DIAGNOSIS — M25552 Pain in left hip: Secondary | ICD-10-CM

## 2022-07-28 DIAGNOSIS — M6281 Muscle weakness (generalized): Secondary | ICD-10-CM

## 2022-07-28 NOTE — Therapy (Signed)
OUTPATIENT PHYSICAL THERAPY LOWER EXTREMITY TREATMENT   Patient Name: Laura [name] MRN: [id number] DOB:1971-05-31, 51 y.o., female Today's Date: 07/28/2022   PT End of Session - 07/28/22 1059     Visit Number 13    Number of Visits 16    Date for PT Re-Evaluation 08/21/22    PT Start Time 1100    PT Stop Time 1145    PT Time Calculation (min) 45 min    Activity Tolerance Patient tolerated treatment well    Behavior During Therapy WFL for tasks assessed/performed              Past Medical History:  Diagnosis Date   Allergic rhinitis    Arthritis    Depression    Dyslipidemia    Dysmenorrhea    Family history of adverse reaction to anesthesia    mother slow to wake up   Gall bladder disease    GERD (gastroesophageal reflux disease)    Headache    History of kidney stones    Infertility, female    Obesity    Pneumonia    Polycystic ovary disease    PONV (postoperative nausea and vomiting)    Sebaceous hyperplasia     on tretinoin   Sleep apnea    cpap   WPW (Wolff-Parkinson-White syndrome)    hx of ablation 2012   Past Surgical History:  Procedure Laterality Date   CESAREAN SECTION     x 2   gastric bypass surgery      INCISIONAL HERNIA REPAIR N/A 07/01/2021   Procedure: INCISIONAL HERNIA REPAIR WITH MESH;  Surgeon: Kinsinger, Arta Bruce, MD;  Location: [facility];  Service: General;  Laterality: N/A;   LAPAROSCOPIC CHOLECYSTECTOMY     left foot surgery      SLEEVE GASTROPLASTY     SUPRAVENTRICULAR TACHYCARDIA ABLATION N/A 07/23/2011   Procedure: SUPRAVENTRICULAR TACHYCARDIA ABLATION;  Surgeon: Evans Lance, MD;  Location: [facility] CATH LAB;  Service: Cardiovascular;  Laterality: N/A;   transthoracic echcardiogram  09/15/2006   Patient Active Problem List   Diagnosis Date Noted   Irregular periods 04/30/2022   Right upper quadrant pain 08/26/2021   Diarrhea 08/26/2021   Nausea 08/26/2021   Incisional hernia 07/01/2021   H/O metal allergy 05/01/2020    Insomnia 05/01/2020   Iron deficiency 10/26/2019   Hx of gastric bypass 06/29/2019   Migraine 04/28/2019   Situational depression 04/28/2019   Family history of colon cancer 01/19/2019   Hx of adenomatous colonic polyps 01/19/2019   Pain and swelling of right knee 12/20/2018   Intractable right heel pain 12/20/2018   Psychological factors affecting morbid obesity ([place]) 01/27/2018   Tendinitis of right hip flexor 12/14/2017   Protein deficiency ([place]) 12/03/2017   Bilateral hip pain 10/17/2016   Left ureteral stone 04/11/2016   Low back pain 09/05/2015   Uterine fibroid 08/15/2015   Menorrhagia with irregular cycle 08/14/2015   Radiculitis of left cervical region 08/09/2015   Post-traumatic osteoarthritis of left knee 02/15/2015   History of bariatric surgery 02/06/2015   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   GERD (gastroesophageal reflux disease) 06/19/2014   History of continuous positive airway pressure (CPAP) therapy at home 06/19/2014   OSA on CPAP 06/19/2014   Need for immunization against influenza 05/11/2014   Other fatigue 05/11/2014   Snoring 05/11/2014   Mixed hyperlipidemia 04/10/2014   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   Vitamin D deficiency 01/11/2014   Heart palpitations 12/07/2013   Menopausal symptom 05/18/2012   Leg  swelling 05/18/2012   History of kidney stones 01/28/2012   Left flank pain 01/28/2012   Microscopic hematuria 01/28/2012   Sebaceous hyperplasia 10/24/2011   BACK PAIN, THORACIC REGION 07/25/2009   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/19/2006   OBESITY, NOS 05/19/2006   Major depressive disorder, recurrent episode ([place]) 05/19/2006   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    PCP: Beatrice Lecher, MD REFERRING PROVIDER: Aundria Mems, MD REFERRING DIAG: [phone] (ICD-10-CM) - Bilateral hip pain  THERAPY DIAG:  Pain in right hip  Pain in left hip  Muscle weakness (generalized)  Rationale for Evaluation and Treatment  Rehabilitation  ONSET DATE: 05/19/22  SUBJECTIVE:   SUBJECTIVE STATEMENT: Patient reports bilateral hip pain today (2/10 R, 3/10 L), states it increases with weight bearing into L LE during walking. Patient states she continues to have "pin point" pain in bilateral hips. PERTINENT HISTORY: Long standing h/o bilateral hip pain after childbirth, weight loss s/p bariatric surgery 3 years ago, [employer] with cardiac ablation, hernia repair 2022  PAIN:  Are you having pain? Yes: NPRS scale: 2-3/10 Pain location: Right hip pain Pain description: sharp Aggravating factors: goes to an 8/10 when she wakes up at night and when first rising in the morning Relieving factors: nothing .  PRECAUTIONS: None  WEIGHT BEARING RESTRICTIONS No  PATIENT GOALS Strengthen hips to reduce pain  OBJECTIVE:    07/14/22: Positive FABER R hip and FABIR bil hip  LOWER EXTREMITY MMT:  MMT Right eval Left eval Right 06/18/22 Left 06/18/22 Right  07/14/22 Left  07/14/22 Right 07/24/22 Left 07/24/22  Hip flexion 3+/5 4/5 4+ 4+ 4+ _0 Hip extension 4-/5 4-/5 4+ 4+ 5 4+ 5 4+  Hip abduction 4-/5 4-/5 5 4+ 5 4+ 5 5-  Hip adduction   5 5      Hip internal rotation          Hip external rotation          Knee flexion 4/5 4/_1 Knee extension 4/5 4/_2 Ankle dorsiflexion 4/5 5/_3 Ankle plantarflexion          Ankle inversion          Ankle eversion           (Blank rows = not tested)  [facility] Adult PT Treatment:                                                DATE: 07/28/2022 Therapeutic Exercise: Supine dynamic piriformis stretch 3x10" Figure 4 LTR (discontiued d/t groin discomfort on L) ITB stretch/hip ADD stretch with strap x20" each way S/L passive ITB/TFL stretch S/L clamshells RTB x10 Bridge w/ball squeeze + arm press YTB x15 Manual Therapy: S/L STM glutes, piriformis, ITB Modalities Ionotophoresis with 1.0 mL 46m/ml Dexamethasone - 4-6 hour patch (867mmin) L  trochanter    [facility] Adult PT Treatment:                                                DATE: 07/24/22 Therapeutic Exercise: Treadmill x 5 minutes x 2.2 mph and 2% incline Seated hip flexor stretch 3  x 30 sec Supine long leg hip IR for TFL stretch R x 30 sec R ITB with strap 2x60 sec  Therapeutic Activities: MMT, ROM and FOTO assessed for re-cert  Manual Therapy: Supine long leg distraction to R hip  Hooklying R hip distraction with belt Passive R hip IR (no pain after mobs)  Modalities Ionotophoresis with 1.0 mL 37m/ml Dexamethasone - 4-6 hour patch (838mmin) to bil greater trochanters (patch #6 of 6)    [facility] Adult PT Treatment:                                                DATE: 07/17/22 Therapeutic Exercise: Treadmill x 7 minutes x 2.1 mph and 2% incline (goals assessed and current status) Bil HS stretch with strap and straight leg x 60 sec ea Thomas stretch x 30 sec R EOB hip flexor/quad stretch with strap x 30 sec Supine long leg hip IR for TFL stretch bil x 30 sec  Manual Therapy: Prone PA mobs with R hip in ER and flex (foot on mat, knee on stool)  Supine long leg distraction to R hip with thrust pulls x 10 Passive R hip IR  Modalities Ionotophoresis with 1.0 mL 28m26ml Dexamethasone - 4-6 hour patch (37m31mn) to bil greater trochanters (patch #5 of 6)     PATIENT EDUCATION:  Education details: Postural alignment/pelvic stability awareness with HEP Person educated: Patient Education method: Explanation, demo, handout Education comprehension: verbalized understanding and returned demonstration  HOME EXERCISE PROGRAM: Access Code: [email or web address] URL: [email or web address] Date: 06/16/2022 Prepared by: JuliAlmyra Freeercises - Supine Piriformis Stretch Pulling Heel to Hip  - 2 x daily - 7 x weekly - 1 sets - 3 reps - 30 seconds hold - Prone Press Up  - 2 x daily - 7 x weekly - 1 sets - 10 reps - Plank on Knees  - 2 x daily - 7 x weekly - 1 sets - 10  reps - Seated Hamstring Stretch with Chair  - 2 x daily - 7 x weekly - 1 sets - 3 reps - 30 seconds hold - Squat with Chair Touch  - 2 x daily - 7 x weekly - 1 sets - 10 reps - Mini Lunge  - 2 x daily - 7 x weekly - 1 sets - 10 reps - Standing Isometric Hip Abduction with Knee at 90 at Wall  - 2 x daily - 7 x weekly - 1 sets - 10 reps - Side Lunge Adductor Stretch  - 2 x daily - 7 x weekly - 1 sets - 10 reps - Squatting Anti-Rotation Press  - 1 x daily - 3 x weekly - 3 sets - 10 reps - Standing Trunk Rotation with Resistance  - 1 x daily - 3 x weekly - 3 sets - 10 reps  ASSESSMENT:  CLINICAL IMPRESSION: TTP greater on  L as compared to R durng STM to gluteals/piriformis. Tactile cues provided to improve pelvic floor and TA activation during bridging exercise. Initial pelvic instability noted during unilateal resisted hip abduction; cueing improved stability and proprioceptive awareness.  OBJECTIVE IMPAIRMENTS Abnormal gait, decreased activity tolerance, difficulty walking, decreased strength, hypomobility, increased fascial restrictions, impaired flexibility, and pain.   GOALS: Goals reviewed with patient? Yes  SHORT TERM GOALS: Target date: 06/18/2022  The patient will be indep with HEP Baseline:no exercise Goal status:  MET  2.  The patient will verbalize understanding of home work station set up and potentially moving from recliner to have more core engagement t/o the day. Baseline: working from Psychologist, occupational, working intermittently at desk for 2 hours Goal status:MET 07/24/22  3.  The patient will report pain in the morning < or equal to 5/10 Baseline: "It's less than 5" Goal status: MET 07/24/22  4.  The patient will improve R hip strength to 4/5. Baseline: 3+/5 Goal status: MET  LONG TERM GOALS: Target date: 07/16/2022   The patient will be indep with HEP progression. Baseline: no HEP Goal status: IN PROGRESS   2.  The patient will improve FOTO to 68%. Baseline: 56% at eval;  55% on 07/24/22 Goal status: IN PROGRESS  3.  The patient will improve bilat hip abductor strength to 5/5. Baseline: Met except Left hip ABD 5-/5, ext 4+/5 Goal status:IN PROGRESS 07/24/22  4.  The patient will improve bilat hamstring length to -10 from full extension. Baseline: -20 / -18:  07/14/22: R -12 deg, L -22 deg, 07/24/22 R = 0 and L -12 Goal status:IN PROGRESS  5.  The patient will tolerate hip flexor stretch without pain R anterior hip. Baseline: + R hip thomas test 07/14/22 Goal status: MET 07/24/22  6.  The patient will be indep with home walking program and community wellness. Baseline:Walking dogs daily, but planning to increase distance and frequency Goal status: IN PROGRESS   PLAN: PT FREQUENCY: 2x/week  PT DURATION: 8 weeks  PLANNED INTERVENTIONS: Therapeutic exercises, Therapeutic activity, Neuromuscular re-education, Balance training, Gait training, Patient/Family education, Self Care, Joint mobilization, Aquatic Therapy, Dry Needling, Electrical stimulation, Spinal mobilization, Moist heat, Taping, and Manual therapy  PLAN FOR NEXT SESSION:  Continue with hip mobs as tolerated. Progress hip strength and core.   Hardin Negus, PTA 07/28/2022, 12:11 PM

## 2022-07-30 ENCOUNTER — Encounter: Payer: Self-pay | Admitting: Rehabilitative and Restorative Service Providers"

## 2022-07-30 ENCOUNTER — Telehealth: Payer: Self-pay | Admitting: Family Medicine

## 2022-07-30 ENCOUNTER — Ambulatory Visit: Payer: Managed Care, Other (non HMO) | Admitting: Rehabilitative and Restorative Service Providers"

## 2022-07-30 DIAGNOSIS — G43809 Other migraine, not intractable, without status migrainosus: Secondary | ICD-10-CM

## 2022-07-30 DIAGNOSIS — M6281 Muscle weakness (generalized): Secondary | ICD-10-CM

## 2022-07-30 DIAGNOSIS — M25552 Pain in left hip: Secondary | ICD-10-CM

## 2022-07-30 DIAGNOSIS — M25551 Pain in right hip: Secondary | ICD-10-CM

## 2022-07-30 NOTE — Telephone Encounter (Signed)
Patient is scheduled for 09/05/22 for annual physical!

## 2022-07-30 NOTE — Therapy (Signed)
OUTPATIENT PHYSICAL THERAPY LOWER EXTREMITY TREATMENT   Patient Name: Laura [name] MRN: [id number] DOB:07-Nov-1970, 51 y.o., female Today's Date: 07/30/2022   PT End of Session - 07/30/22 1108     Visit Number 14    Number of Visits 16    Date for PT Re-Evaluation 08/21/22    PT Start Time 1105    PT Stop Time 9381    PT Time Calculation (min) 43 min    Activity Tolerance Patient tolerated treatment well    Behavior During Therapy WFL for tasks assessed/performed              Past Medical History:  Diagnosis Date   Allergic rhinitis    Arthritis    Depression    Dyslipidemia    Dysmenorrhea    Family history of adverse reaction to anesthesia    mother slow to wake up   Gall bladder disease    GERD (gastroesophageal reflux disease)    Headache    History of kidney stones    Infertility, female    Obesity    Pneumonia    Polycystic ovary disease    PONV (postoperative nausea and vomiting)    Sebaceous hyperplasia     on tretinoin   Sleep apnea    cpap   WPW (Wolff-Parkinson-White syndrome)    hx of ablation 2012   Past Surgical History:  Procedure Laterality Date   CESAREAN SECTION     x 2   gastric bypass surgery      INCISIONAL HERNIA REPAIR N/A 07/01/2021   Procedure: INCISIONAL HERNIA REPAIR WITH MESH;  Surgeon: Kinsinger, Arta Bruce, MD;  Location: [facility];  Service: General;  Laterality: N/A;   LAPAROSCOPIC CHOLECYSTECTOMY     left foot surgery      SLEEVE GASTROPLASTY     SUPRAVENTRICULAR TACHYCARDIA ABLATION N/A 07/23/2011   Procedure: SUPRAVENTRICULAR TACHYCARDIA ABLATION;  Surgeon: Evans Lance, MD;  Location: [facility] CATH LAB;  Service: Cardiovascular;  Laterality: N/A;   transthoracic echcardiogram  09/15/2006   Patient Active Problem List   Diagnosis Date Noted   Irregular periods 04/30/2022   Right upper quadrant pain 08/26/2021   Diarrhea 08/26/2021   Nausea 08/26/2021   Incisional hernia 07/01/2021   H/O metal allergy 05/01/2020    Insomnia 05/01/2020   Iron deficiency 10/26/2019   Hx of gastric bypass 06/29/2019   Migraine 04/28/2019   Situational depression 04/28/2019   Family history of colon cancer 01/19/2019   Hx of adenomatous colonic polyps 01/19/2019   Pain and swelling of right knee 12/20/2018   Intractable right heel pain 12/20/2018   Psychological factors affecting morbid obesity ([place]) 01/27/2018   Tendinitis of right hip flexor 12/14/2017   Protein deficiency ([place]) 12/03/2017   Bilateral hip pain 10/17/2016   Left ureteral stone 04/11/2016   Low back pain 09/05/2015   Uterine fibroid 08/15/2015   Menorrhagia with irregular cycle 08/14/2015   Radiculitis of left cervical region 08/09/2015   Post-traumatic osteoarthritis of left knee 02/15/2015   History of bariatric surgery 02/06/2015   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   GERD (gastroesophageal reflux disease) 06/19/2014   History of continuous positive airway pressure (CPAP) therapy at home 06/19/2014   OSA on CPAP 06/19/2014   Need for immunization against influenza 05/11/2014   Other fatigue 05/11/2014   Snoring 05/11/2014   Mixed hyperlipidemia 04/10/2014   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   Vitamin D deficiency 01/11/2014   Heart palpitations 12/07/2013   Menopausal symptom 05/18/2012   Leg  swelling 05/18/2012   History of kidney stones 01/28/2012   Left flank pain 01/28/2012   Microscopic hematuria 01/28/2012   Sebaceous hyperplasia 10/24/2011   BACK PAIN, THORACIC REGION 07/25/2009   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/19/2006   OBESITY, NOS 05/19/2006   Major depressive disorder, recurrent episode ([place]) 05/19/2006   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    PCP: Beatrice Lecher, MD REFERRING PROVIDER: Aundria Mems, MD REFERRING DIAG: [phone] (ICD-10-CM) - Bilateral hip pain  THERAPY DIAG:  Pain in right hip  Pain in left hip  Muscle weakness (generalized)  Rationale for Evaluation and Treatment  Rehabilitation  ONSET DATE: 05/19/22  SUBJECTIVE:   SUBJECTIVE STATEMENT: The patient notes pain is improved today.  PERTINENT HISTORY: Long standing h/o bilateral hip pain after childbirth, weight loss s/p bariatric surgery 3 years ago, [employer] with cardiac ablation, hernia repair 2022  PAIN:  Are you having pain? Yes: NPRS scale: 1/10 Pain location: left hip pain Pain description: minimal today Aggravating factors: varies, depends on activity Relieving factors: exercise  PRECAUTIONS: None  WEIGHT BEARING RESTRICTIONS No  PATIENT GOALS Strengthen hips to reduce pain  OBJECTIVE:    07/14/22: Positive FABER R hip and FABIR bil hip  LOWER EXTREMITY MMT:  MMT Right eval Left eval Right 06/18/22 Left 06/18/22 Right  07/14/22 Left  07/14/22 Right 07/24/22 Left 07/24/22  Hip flexion 3+/5 4/5 4+ 4+ 4+ _0 Hip extension 4-/5 4-/5 4+ 4+ 5 4+ 5 4+  Hip abduction 4-/5 4-/5 5 4+ 5 4+ 5 5-  Hip adduction   5 5      Knee flexion 4/5 4/_1 Knee extension 4/5 4/_2 Ankle dorsiflexion 4/5 5/_3 (Blank rows = not tested)  [facility] Adult PT Treatment:                                                DATE: 07/30/22 Therapeutic Exercise: Treadmill x 5 minutes up to 2.0 mph  Standing Single leg stance x 10+ seconds R and L Straight leg dead lifts started with reach to chair with imbalance noted harder on L than R x 5 reps each Lunges x 10 reps R and L near stairs Step ups with contralateral march to 6" step + arms overhead Anti-rotation x 10 reps R and L for core x 5#  Heel cord stretch Lumbar rotation with counter lean Modified down dog at counter Quadriped  UE/LE extension x 10 reps R and L Plank 8 points with knees down x 5 reps with 5 second holds Down dog to extended plank x 5 reps Supine Figure 4 R and L x 30 seconds x 1 rep HS stretch adding rotation for lateral hip release Hip adductor stretch R and L with strap Self Care: IASTM  tools for home (spoon), rolling pin, etc with demonstration  [facility] Adult PT Treatment:                                                DATE: 07/28/2022 Therapeutic Exercise: Supine dynamic piriformis stretch 3x10" Figure 4 LTR (discontiued d/t groin discomfort on L) ITB stretch/hip  ADD stretch with strap x20" each way S/L passive ITB/TFL stretch S/L clamshells RTB x10 Bridge w/ball squeeze + arm press YTB x15 Manual Therapy: S/L STM glutes, piriformis, ITB Modalities Ionotophoresis with 1.0 mL 29m/ml Dexamethasone - 4-6 hour patch (8556mmin) L trochanter (patch #6 of 6)    [facility] Adult PT Treatment:                                                DATE: 07/24/22 Therapeutic Exercise: Treadmill x 5 minutes x 2.2 mph and 2% incline Seated hip flexor stretch 3 x 30 sec Supine long leg hip IR for TFL stretch R x 30 sec R ITB with strap 2x60 sec  Therapeutic Activities: MMT, ROM and FOTO assessed for re-cert  Manual Therapy: Supine long leg distraction to R hip  Hooklying R hip distraction with belt Passive R hip IR (no pain after mobs)  Modalities Ionotophoresis with 1.0 mL 56m3ml Dexamethasone - 4-6 hour patch (47m51mn) to bil greater trochanters (patch #5 of 6)   PATIENT EDUCATION:  Education details: Postural alignment/pelvic stability awareness with HEP Person educated: Patient Education method: Explanation, demo, handout Education comprehension: verbalized understanding and returned demonstration  HOME EXERCISE PROGRAM: Access Code: [email or web address] URL: [email or web address] Date: 06/16/2022 Prepared by: JuliAlmyra Freeercises - Supine Piriformis Stretch Pulling Heel to Hip  - 2 x daily - 7 x weekly - 1 sets - 3 reps - 30 seconds hold - Prone Press Up  - 2 x daily - 7 x weekly - 1 sets - 10 reps - Plank on Knees  - 2 x daily - 7 x weekly - 1 sets - 10 reps - Seated Hamstring Stretch with Chair  - 2 x daily - 7 x weekly - 1 sets - 3 reps - 30 seconds hold - Squat with  Chair Touch  - 2 x daily - 7 x weekly - 1 sets - 10 reps - Mini Lunge  - 2 x daily - 7 x weekly - 1 sets - 10 reps - Standing Isometric Hip Abduction with Knee at 90 at Wall  - 2 x daily - 7 x weekly - 1 sets - 10 reps - Side Lunge Adductor Stretch  - 2 x daily - 7 x weekly - 1 sets - 10 reps - Squatting Anti-Rotation Press  - 1 x daily - 3 x weekly - 3 sets - 10 reps - Standing Trunk Rotation with Resistance  - 1 x daily - 3 x weekly - 3 sets - 10 reps  ASSESSMENT:  CLINICAL IMPRESSION: The patient has reduced pain today.  PT went back to strengthening progression with stretching and STM at end of session. Patient tolerating lunges and step ups with less loss of balance today. Plan to reduce frequency to 1x/week to keep progressing to patient tolerance.   OBJECTIVE IMPAIRMENTS Abnormal gait, decreased activity tolerance, difficulty walking, decreased strength, hypomobility, increased fascial restrictions, impaired flexibility, and pain.   GOALS: Goals reviewed with patient? Yes  SHORT TERM GOALS: Target date: 06/18/2022  The patient will be indep with HEP Baseline:no exercise Goal status: MET  2.  The patient will verbalize understanding of home work station set up and potentially moving from recliner to have more core engagement t/o the day. 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  The patient will report pain in the morning < or equal to 5/10 Baseline: "It's less than 5" Goal status: MET 07/24/22  4.  The patient will improve R hip strength to 4/5. Baseline: 3+/5 Goal status: MET  LONG TERM GOALS: Target date: 08/21/22  The patient will be indep with HEP progression. Baseline: no HEP Goal status: IN PROGRESS   2.  The patient will improve FOTO to 68%. Baseline: 56% at eval; 55% on 07/24/22 Goal status: IN PROGRESS  3.  The patient will improve bilat hip abductor strength to 5/5. Baseline: Met except Left  hip ABD 5-/5, ext 4+/5 Goal status:IN PROGRESS 07/24/22  4.  The patient will improve bilat hamstring length to -10 from full extension. Baseline: -20 / -18:  07/14/22: R -12 deg, L -22 deg, 07/24/22 R = 0 and L -12 Goal status:IN PROGRESS  5.  The patient will tolerate hip flexor stretch without pain R anterior hip. Baseline: + R hip thomas test 07/14/22 Goal status: MET 07/24/22  6.  The patient will be indep with home walking program and community wellness. Baseline:Walking dogs daily, but planning to increase distance and frequency Goal status: IN PROGRESS   PLAN: PT FREQUENCY: 2x/week  PT DURATION: 8 weeks  PLANNED INTERVENTIONS: Therapeutic exercises, Therapeutic activity, Neuromuscular re-education, Balance training, Gait training, Patient/Family education, Self Care, Joint mobilization, Aquatic Therapy, Dry Needling, Electrical stimulation, Spinal mobilization, Moist heat, Taping, and Manual therapy  PLAN FOR NEXT SESSION:  Continue with hip mobs as tolerated. Progress hip strength and core.   [place], PT 07/30/2022, 11:56 AM

## 2022-07-31 LAB — COMPLETE METABOLIC PANEL WITH GFR
AG Ratio: 2 (calc) (ref 1.0–2.5)
ALT: 73 U/L — ABNORMAL HIGH (ref 6–29)
AST: 44 U/L — ABNORMAL HIGH (ref 10–35)
Albumin: 4.1 g/dL (ref 3.6–5.1)
Alkaline phosphatase (APISO): 104 U/L (ref 37–153)
BUN: 13 mg/dL (ref 7–25)
CO2: 28 mmol/L (ref 20–32)
Calcium: 8.8 mg/dL (ref 8.6–10.4)
Chloride: 108 mmol/L (ref 98–110)
Creat: 0.83 mg/dL (ref 0.50–1.03)
Globulin: 2.1 g/dL (calc) (ref 1.9–3.7)
Glucose, Bld: 98 mg/dL (ref 65–99)
Potassium: 4.7 mmol/L (ref 3.5–5.3)
Sodium: 142 mmol/L (ref 135–146)
Total Bilirubin: 0.6 mg/dL (ref 0.2–1.2)
Total Protein: 6.2 g/dL (ref 6.1–8.1)
eGFR: 85 mL/min/{1.73_m2} (ref >=60)

## 2022-07-31 LAB — TSH+FREE T4: TSH W/REFLEX TO FT4: 2.11 mIU/L

## 2022-07-31 NOTE — Telephone Encounter (Signed)
Orders Placed This Encounter  Procedures   US ABDOMEN COMPLETE W/ELASTOGRAPHY    Standing Status:   Future    Standing Expiration Date:   08/01/2023    Order Specific Question:   Reason for Exam (SYMPTOM  OR DIAGNOSIS REQUIRED)    Answer:   elevated liver enzymes    Order Specific Question:   Preferred imaging location?    Answer:   [facility]   TSH + free T4   COMPLETE METABOLIC PANEL WITH GFR

## 2022-07-31 NOTE — Progress Notes (Signed)
Laura [name], enzymes have increased compared to 3 months ago.  I like to get you scheduled for an ultrasound of the liver.  If you are okay with this then please let me know.  Neuroid looks great today.

## 2022-08-08 ENCOUNTER — Ambulatory Visit: Payer: Managed Care, Other (non HMO)

## 2022-08-08 ENCOUNTER — Ambulatory Visit: Payer: Managed Care, Other (non HMO) | Admitting: Rehabilitative and Restorative Service Providers"

## 2022-08-08 DIAGNOSIS — M25551 Pain in right hip: Secondary | ICD-10-CM | POA: Diagnosis not present

## 2022-08-08 DIAGNOSIS — R748 Abnormal levels of other serum enzymes: Secondary | ICD-10-CM

## 2022-08-08 DIAGNOSIS — M25552 Pain in left hip: Secondary | ICD-10-CM

## 2022-08-08 NOTE — Therapy (Signed)
OUTPATIENT PHYSICAL THERAPY LOWER EXTREMITY TREATMENT   Patient Name: Laura [name] MRN: [id number] DOB:01/06/1971, 51 y.o., female Today's Date: 08/08/2022   PT End of Session - 08/08/22 1108     Visit Number 15    Number of Visits 16    Date for PT Re-Evaluation 08/21/22    PT Start Time 1107    PT Stop Time 8811    PT Time Calculation (min) 38 min    Activity Tolerance Patient tolerated treatment well    Behavior During Therapy WFL for tasks assessed/performed              Past Medical History:  Diagnosis Date   Allergic rhinitis    Arthritis    Depression    Dyslipidemia    Dysmenorrhea    Family history of adverse reaction to anesthesia    mother slow to wake up   Gall bladder disease    GERD (gastroesophageal reflux disease)    Headache    History of kidney stones    Infertility, female    Obesity    Pneumonia    Polycystic ovary disease    PONV (postoperative nausea and vomiting)    Sebaceous hyperplasia     on tretinoin   Sleep apnea    cpap   WPW (Wolff-Parkinson-White syndrome)    hx of ablation 2012   Past Surgical History:  Procedure Laterality Date   CESAREAN SECTION     x 2   gastric bypass surgery      INCISIONAL HERNIA REPAIR N/A 07/01/2021   Procedure: INCISIONAL HERNIA REPAIR WITH MESH;  Surgeon: Kinsinger, Arta Bruce, MD;  Location: [facility];  Service: General;  Laterality: N/A;   LAPAROSCOPIC CHOLECYSTECTOMY     left foot surgery      SLEEVE GASTROPLASTY     SUPRAVENTRICULAR TACHYCARDIA ABLATION N/A 07/23/2011   Procedure: SUPRAVENTRICULAR TACHYCARDIA ABLATION;  Surgeon: Evans Lance, MD;  Location: [facility] CATH LAB;  Service: Cardiovascular;  Laterality: N/A;   transthoracic echcardiogram  09/15/2006   Patient Active Problem List   Diagnosis Date Noted   Irregular periods 04/30/2022   Right upper quadrant pain 08/26/2021   Diarrhea 08/26/2021   Nausea 08/26/2021   Incisional hernia 07/01/2021   H/O metal allergy 05/01/2020    Insomnia 05/01/2020   Iron deficiency 10/26/2019   Hx of gastric bypass 06/29/2019   Migraine 04/28/2019   Situational depression 04/28/2019   Family history of colon cancer 01/19/2019   Hx of adenomatous colonic polyps 01/19/2019   Pain and swelling of right knee 12/20/2018   Intractable right heel pain 12/20/2018   Psychological factors affecting morbid obesity ([place]) 01/27/2018   Tendinitis of right hip flexor 12/14/2017   Protein deficiency ([place]) 12/03/2017   Bilateral hip pain 10/17/2016   Left ureteral stone 04/11/2016   Low back pain 09/05/2015   Uterine fibroid 08/15/2015   Menorrhagia with irregular cycle 08/14/2015   Radiculitis of left cervical region 08/09/2015   Post-traumatic osteoarthritis of left knee 02/15/2015   History of bariatric surgery 02/06/2015   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   GERD (gastroesophageal reflux disease) 06/19/2014   History of continuous positive airway pressure (CPAP) therapy at home 06/19/2014   OSA on CPAP 06/19/2014   Need for immunization against influenza 05/11/2014   Other fatigue 05/11/2014   Snoring 05/11/2014   Mixed hyperlipidemia 04/10/2014   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   Vitamin D deficiency 01/11/2014   Heart palpitations 12/07/2013   Menopausal symptom 05/18/2012   Leg  swelling 05/18/2012   History of kidney stones 01/28/2012   Left flank pain 01/28/2012   Microscopic hematuria 01/28/2012   Sebaceous hyperplasia 10/24/2011   BACK PAIN, THORACIC REGION 07/25/2009   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/19/2006   OBESITY, NOS 05/19/2006   Major depressive disorder, recurrent episode ([place]) 05/19/2006   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    PCP: Beatrice Lecher, MD REFERRING PROVIDER: Aundria Mems, MD REFERRING DIAG: [phone] (ICD-10-CM) - Bilateral hip pain  THERAPY DIAG:  Pain in right hip  Pain in left hip  Rationale for Evaluation and Treatment Rehabilitation  ONSET DATE:  05/19/22  SUBJECTIVE:   SUBJECTIVE STATEMENT: The patient notes pain is improved today. She started a part time job working 3 days/week and has been walking more.   PERTINENT HISTORY: Long standing h/o bilateral hip pain after childbirth, weight loss s/p bariatric surgery 3 years ago, [employer] with cardiac ablation, hernia repair 2022  PAIN:  Are you having pain? Yes: NPRS scale: 1/10 Pain location: right hip pain Pain description: minimal today Aggravating factors: varies, depends on activity Relieving factors: exercise  PRECAUTIONS: None  WEIGHT BEARING RESTRICTIONS No  PATIENT GOALS Strengthen hips to reduce pain  OBJECTIVE:    07/14/22: Positive FABER R hip and FABIR bil hip  LOWER EXTREMITY MMT:  MMT Right eval Left eval Right 06/18/22 Left 06/18/22 Right  07/14/22 Left  07/14/22 Right 07/24/22 Left 07/24/22  Hip flexion 3+/5 4/5 4+ 4+ 4+ _0 Hip extension 4-/5 4-/5 4+ 4+ 5 4+ 5 4+  Hip abduction 4-/5 4-/5 5 4+ 5 4+ 5 5-  Hip adduction   5 5      Knee flexion 4/5 4/_1 Knee extension 4/5 4/_2 Ankle dorsiflexion 4/5 5/_3 (Blank rows = not tested)  [facility] Adult PT Treatment:                                                DATE: 08/08/22 Therapeutic Exercise: Treadmill x 2.0 x 4 minutes Standing Single leg stance x 3 reps x 10 seconds R and L Step ups with contralateral march and 4 # overhead weight to 6" x 10 reps R and L Quadriped  UE/LE extension x 10 reps R and L Plank 8 points with knees down x 5 reps with 5 second holds Down dog to extended plank x 5 reps Hip abduction x 10 reps R And L with core engagement Supine Hamstring stretch with strap IT Band stretchwith strap Figure 4 R and L x 30 seconds x 2 rep Bridge with marching x 10 reps Hip hike/depression  Sidelying Side plank modified to knees Manual Hip distraction R side Self Care: Discussed ice massage and provided handout for home mgmt of point  tenderness that comes and goes in lateral hip.   [facility] Adult PT Treatment:                                                DATE: 07/30/22 Therapeutic Exercise: Treadmill x 5 minutes up to 2.0 mph  Standing Single leg stance x 10+ seconds R and L Straight  leg dead lifts started with reach to chair with imbalance noted harder on L than R x 5 reps each Lunges x 10 reps R and L near stairs Step ups with contralateral march to 6" step + arms overhead Anti-rotation x 10 reps R and L for core x 5#  Heel cord stretch Lumbar rotation with counter lean Modified down dog at counter Quadriped  UE/LE extension x 10 reps R and L Plank 8 points with knees down x 5 reps with 5 second holds Down dog to extended plank x 5 reps Supine Figure 4 R and L x 30 seconds x 1 rep HS stretch adding rotation for lateral hip release Hip adductor stretch R and L with strap Self Care: IASTM tools for home (spoon), rolling pin, etc with demonstration    PATIENT EDUCATION:  Education details: HEP, ice massage Person educated: Patient Education method: Explanation, demo, handout Education comprehension: verbalized understanding and returned demonstration  HOME EXERCISE PROGRAM: Access Code: [email or web address] URL: [email or web address] Date: 08/08/2022 Prepared by: Rudell Cobb  Exercises - Supine Piriformis Stretch Pulling Heel to Hip  - 2 x daily - 7 x weekly - 1 sets - 3 reps - 30 seconds hold - Prone Press Up  - 2 x daily - 7 x weekly - 1 sets - 10 reps - Plank on Knees  - 2 x daily - 7 x weekly - 1 sets - 10 reps - Seated Hamstring Stretch with Chair  - 2 x daily - 7 x weekly - 1 sets - 3 reps - 30 seconds hold - Squat with Chair Touch  - 2 x daily - 7 x weekly - 1 sets - 10 reps - Mini Lunge  - 2 x daily - 7 x weekly - 1 sets - 10 reps - Standing Isometric Hip Abduction with Knee at 90 at Wall  - 2 x daily - 7 x weekly - 1 sets - 10 reps - Side Lunge Adductor Stretch  - 2 x daily - 7 x  weekly - 1 sets - 10 reps - Squatting Anti-Rotation Press  - 1 x daily - 3 x weekly - 3 sets - 10 reps - Standing Trunk Rotation with Resistance  - 1 x daily - 3 x weekly - 3 sets - 10 reps  Patient Education - Ice Massage  ASSESSMENT:  CLINICAL IMPRESSION: The patient has reduced pain today.  PT to begin 1x/week frequency next week continuing to progress strengthening. Plan to d/c in next 1-2 visits with f/u with MD scheduled mid January. Patient met LTG for FOTO today.   OBJECTIVE IMPAIRMENTS Abnormal gait, decreased activity tolerance, difficulty walking, decreased strength, hypomobility, increased fascial restrictions, impaired flexibility, and pain.   GOALS: Goals reviewed with patient? Yes  SHORT TERM GOALS: Target date: 06/18/2022  The patient will be indep with HEP Baseline:no exercise Goal status: MET  2.  The patient will verbalize understanding of home work station set up and potentially moving from recliner to have more core engagement t/o the day. Baseline: working from Psychologist, occupational, working intermittently at desk for 2 hours Goal status:MET 07/24/22  3.  The patient will report pain in the morning < or equal to 5/10 Baseline: "It's less than 5" Goal status: MET 07/24/22  4.  The patient will improve R hip strength to 4/5. Baseline: 3+/5 Goal status: MET  LONG TERM GOALS: Target date: 08/21/22  The patient will be indep with HEP progression. Baseline: no HEP Goal status: MET  2.  The patient will improve FOTO to 68%. Baseline: 56% at eval; 75% ON 12/29 Goal status: MET  3.  The patient will improve bilat hip abductor strength to 5/5. Baseline: Met except Left hip ABD 5-/5, ext 4+/5 Goal status:IN PROGRESS 07/24/22  4.  The patient will improve bilat hamstring length to -10 from full extension. Baseline: -20 / -18:  07/14/22: R -12 deg, L -22 deg, 07/24/22 R = 0 and L -12 Goal status:IN PROGRESS  5.  The patient will tolerate hip flexor stretch without pain R  anterior hip. Baseline: + R hip thomas test 07/14/22 Goal status: MET 07/24/22  6.  The patient will be indep with home walking program and community wellness. Baseline:Walking dogs daily, but planning to increase distance and frequency Goal status: IN PROGRESS   PLAN: PT FREQUENCY: 2x/week  PT DURATION: 8 weeks  PLANNED INTERVENTIONS: Therapeutic exercises, Therapeutic activity, Neuromuscular re-education, Balance training, Gait training, Patient/Family education, Self Care, Joint mobilization, Aquatic Therapy, Dry Needling, Electrical stimulation, Spinal mobilization, Moist heat, Taping, and Manual therapy  PLAN FOR NEXT SESSION:  Continue with hip mobs as tolerated. Progress hip strength and core.   [place], PT 08/08/2022, 1:01 PM

## 2022-08-12 ENCOUNTER — Other Ambulatory Visit: Payer: Self-pay | Admitting: Family Medicine

## 2022-08-12 NOTE — Progress Notes (Signed)
HI Laura [name], your ultrasound of your liver shows fatty infiltration of the liver.  This can eventually cause scarring and you can develop cirrhosis from this.  No masses or nodules which is reassuring.  Treatment for fatty liver is healthy diet with mostly vegetables and lean proteins as well as regular exercise and weight loss.  The pancreas and spleen look normal.  The right kidney shows some cortical thinning.  And a kidney stone that is not obstructing or causing any issues.  The left kidney also has some cortical thinning and a smaller 5 mm stone.  Anything of the cortex can be caused by certain processes such as high blood pressure but can also be seen somewhat with normal aging.  Lets plan to recheck your liver enzymes in 3 months.

## 2022-08-13 ENCOUNTER — Encounter: Payer: Self-pay | Admitting: Rehabilitative and Restorative Service Providers"

## 2022-08-13 ENCOUNTER — Ambulatory Visit
Payer: Managed Care, Other (non HMO) | Attending: Sports Medicine | Admitting: Rehabilitative and Restorative Service Providers"

## 2022-08-13 DIAGNOSIS — M25551 Pain in right hip: Secondary | ICD-10-CM | POA: Insufficient documentation

## 2022-08-13 DIAGNOSIS — M6281 Muscle weakness (generalized): Secondary | ICD-10-CM | POA: Diagnosis present

## 2022-08-13 DIAGNOSIS — M25552 Pain in left hip: Secondary | ICD-10-CM | POA: Diagnosis present

## 2022-08-13 NOTE — Therapy (Addendum)
OUTPATIENT PHYSICAL THERAPY LOWER EXTREMITY TREATMENT and DISCHARGE SUMMARY   Patient Name: Laura [name] MRN: [id number] DOB:1971/04/26, 52 y.o., female Today's Date: 08/13/2022   PHYSICAL THERAPY DISCHARGE SUMMARY  Visits from Start of Care: 16  Current functional level related to goals / functional outcomes: SEE BELOW-- the patient was placed on hold until MD visit. She has met LTGs and was progressing well.   Remaining deficits: See below for last known patient status.   Education / Equipment: HEP, core strength, self mgmt of symptoms.   Patient agrees to discharge. Patient goals were met. Patient is being discharged due to meeting the stated rehab goals.   PT End of Session - 08/13/22 1152     Visit Number 16    Number of Visits 16    Date for PT Re-Evaluation 08/21/22    Authorization Type cigna    PT Start Time 1154    PT Stop Time 1232    PT Time Calculation (min) 38 min    Activity Tolerance Patient tolerated treatment well    Behavior During Therapy WFL for tasks assessed/performed              Past Medical History:  Diagnosis Date   Allergic rhinitis    Arthritis    Depression    Dyslipidemia    Dysmenorrhea    Family history of adverse reaction to anesthesia    mother slow to wake up   Gall bladder disease    GERD (gastroesophageal reflux disease)    Headache    History of kidney stones    Infertility, female    Obesity    Pneumonia    Polycystic ovary disease    PONV (postoperative nausea and vomiting)    Sebaceous hyperplasia     on tretinoin   Sleep apnea    cpap   WPW (Wolff-Parkinson-White syndrome)    hx of ablation 2012   Past Surgical History:  Procedure Laterality Date   CESAREAN SECTION     x 2   gastric bypass surgery      INCISIONAL HERNIA REPAIR N/A 07/01/2021   Procedure: INCISIONAL HERNIA REPAIR WITH MESH;  Surgeon: Kinsinger, Arta Bruce, MD;  Location: [facility];  Service: General;  Laterality: N/A;   LAPAROSCOPIC  CHOLECYSTECTOMY     left foot surgery      SLEEVE GASTROPLASTY     SUPRAVENTRICULAR TACHYCARDIA ABLATION N/A 07/23/2011   Procedure: SUPRAVENTRICULAR TACHYCARDIA ABLATION;  Surgeon: Evans Lance, MD;  Location: [facility] CATH LAB;  Service: Cardiovascular;  Laterality: N/A;   transthoracic echcardiogram  09/15/2006   Patient Active Problem List   Diagnosis Date Noted   Irregular periods 04/30/2022   Right upper quadrant pain 08/26/2021   Diarrhea 08/26/2021   Nausea 08/26/2021   Incisional hernia 07/01/2021   H/O metal allergy 05/01/2020   Insomnia 05/01/2020   Iron deficiency 10/26/2019   Hx of gastric bypass 06/29/2019   Migraine 04/28/2019   Situational depression 04/28/2019   Family history of colon cancer 01/19/2019   Hx of adenomatous colonic polyps 01/19/2019   Pain and swelling of right knee 12/20/2018   Intractable right heel pain 12/20/2018   Psychological factors affecting morbid obesity ([place]) 01/27/2018   Tendinitis of right hip flexor 12/14/2017   Protein deficiency ([place]) 12/03/2017   Bilateral hip pain 10/17/2016   Left ureteral stone 04/11/2016   Low back pain 09/05/2015   Uterine fibroid 08/15/2015   Menorrhagia with irregular cycle 08/14/2015   Radiculitis of left cervical  region 08/09/2015   Post-traumatic osteoarthritis of left knee 02/15/2015   History of bariatric surgery 02/06/2015   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   GERD (gastroesophageal reflux disease) 06/19/2014   History of continuous positive airway pressure (CPAP) therapy at home 06/19/2014   OSA on CPAP 06/19/2014   Need for immunization against influenza 05/11/2014   Other fatigue 05/11/2014   Snoring 05/11/2014   Mixed hyperlipidemia 04/10/2014   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   Vitamin D deficiency 01/11/2014   Heart palpitations 12/07/2013   Menopausal symptom 05/18/2012   Leg swelling 05/18/2012   History of kidney stones 01/28/2012   Left flank pain 01/28/2012   Microscopic  hematuria 01/28/2012   Sebaceous hyperplasia 10/24/2011   BACK PAIN, THORACIC REGION 07/25/2009   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/19/2006   OBESITY, NOS 05/19/2006   Major depressive disorder, recurrent episode ([place]) 05/19/2006   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    PCP: Beatrice Lecher, MD REFERRING PROVIDER: Aundria Mems, MD REFERRING DIAG: [phone] (ICD-10-CM) - Bilateral hip pain  THERAPY DIAG:  Pain in right hip  Pain in left hip  Muscle weakness (generalized)  Rationale for Evaluation and Treatment Rehabilitation  ONSET DATE: 05/19/22  SUBJECTIVE:   SUBJECTIVE STATEMENT: The patient reports she likes her new job. She is doing better and thinks the new job keeps her off of the sofa more. She gets some occasional pain that is described as shooting pain in the hip and felt that one of our exercises irritated her shoulder last visit (we did Q-ped UE extension, plank and side plank).   PERTINENT HISTORY: Long standing h/o bilateral hip pain after childbirth, weight loss s/p bariatric surgery 3 years ago, [employer] with cardiac ablation, hernia repair 2022  PAIN:  Are you having pain?No pain today!  PRECAUTIONS: None  WEIGHT BEARING RESTRICTIONS No  PATIENT GOALS Strengthen hips to reduce pain  OBJECTIVE:    07/14/22: Positive FABER R hip and FABIR bil hip  LOWER EXTREMITY MMT:  MMT Right eval Left eval Right 06/18/22 Left 06/18/22 Right  07/14/22 Left  07/14/22 Right 07/24/22 Left 07/24/22 Right 08/13/22 Left 08/13/22  Hip flexion 3+/5 4/5 4+ 4+ 4+ _0 Hip extension 4-/5 4-/5 4+ 4+ 5 4+ 5 4+ 4+ 4+  Hip abduction 4-/5 4-/5 5 4+ 5 4+ 5 5- 4+ 4+  Hip adduction   5 5        Knee flexion 4/5 4/_1 Knee extension 4/5 4/_2 Ankle dorsiflexion 4/5 5/_3 (Blank rows = not tested)  [facility] Adult PT Treatment:                                                DATE: 08/13/22 Therapeutic  Exercise: Treadmill x 5.5 minutes up to 2.2 mph without UE support Alternating lunges x 10 reps R and L sides Slow marching x 10 reps alternating with 2-3 second holds for balance Static hold high lunge with rotation towards front leg for core and balance Single leg stance with intermittent UE support Single leg dead lift Sit to stand squats x 10 reps Figure 4 stretch Piriformis stretch Knee to chest MMT 4+/5 hip abduction and extension, others 5/5 Self: Discussed gym  routine for post d/c progression  Discussed home walking plan   [facility] Adult PT Treatment:                                                DATE: 08/08/22 Therapeutic Exercise: Treadmill x 2.0 x 4 minutes Standing Single leg stance x 3 reps x 10 seconds R and L Step ups with contralateral march and 4 # overhead weight to 6" x 10 reps R and L Quadriped  UE/LE extension x 10 reps R and L Plank 8 points with knees down x 5 reps with 5 second holds Down dog to extended plank x 5 reps Hip abduction x 10 reps R And L with core engagement Supine Hamstring stretch with strap IT Band stretchwith strap Figure 4 R and L x 30 seconds x 2 rep Bridge with marching x 10 reps Hip hike/depression  Sidelying Side plank modified to knees Manual Hip distraction R side Self Care: Discussed ice massage and provided handout for home mgmt of point tenderness that comes and goes in lateral hip.  PATIENT EDUCATION:  Education details: HEP, ice massage Person educated: Patient Education method: Explanation, demo, handout Education comprehension: verbalized understanding and returned demonstration  HOME EXERCISE PROGRAM: Access Code: [email or web address] URL: [email or web address] Date: 08/08/2022 Prepared by: Rudell Cobb  Exercises - Supine Piriformis Stretch Pulling Heel to Hip  - 2 x daily - 7 x weekly - 1 sets - 3 reps - 30 seconds hold - Prone Press Up  - 2 x daily - 7 x weekly - 1 sets - 10 reps - Plank on Knees  -  2 x daily - 7 x weekly - 1 sets - 10 reps - Seated Hamstring Stretch with Chair  - 2 x daily - 7 x weekly - 1 sets - 3 reps - 30 seconds hold - Squat with Chair Touch  - 2 x daily - 7 x weekly - 1 sets - 10 reps - Mini Lunge  - 2 x daily - 7 x weekly - 1 sets - 10 reps - Standing Isometric Hip Abduction with Knee at 90 at Wall  - 2 x daily - 7 x weekly - 1 sets - 10 reps - Side Lunge Adductor Stretch  - 2 x daily - 7 x weekly - 1 sets - 10 reps - Squatting Anti-Rotation Press  - 1 x daily - 3 x weekly - 3 sets - 10 reps - Standing Trunk Rotation with Resistance  - 1 x daily - 3 x weekly - 3 sets - 10 reps  Patient Education - Ice Massage  ASSESSMENT:  CLINICAL IMPRESSION: The patient has met LTGs with exception of MMT goal, which is partially  met. Patient has f/u  MD appt and is doing well with HEP. Plan to place on hold until after MD visit in 2 weeks.  OBJECTIVE IMPAIRMENTS Abnormal gait, decreased activity tolerance, difficulty walking, decreased strength, hypomobility, increased fascial restrictions, impaired flexibility, and pain.   GOALS: Goals reviewed with patient? Yes  SHORT TERM GOALS: Target date: 06/18/2022  The patient will be indep with HEP Baseline:no exercise Goal status: MET  2.  The patient will verbalize understanding of home work station set up and potentially moving from recliner to have more core engagement t/o the day. Baseline: working from Psychologist, occupational, working intermittently at desk for 2 hours Goal  status:MET 07/24/22  3.  The patient will report pain in the morning < or equal to 5/10 Baseline: "It's less than 5" Goal status: MET 07/24/22  4.  The patient will improve R hip strength to 4/5. Baseline: 3+/5 Goal status: MET  LONG TERM GOALS: Target date: 08/21/22  The patient will be indep with HEP progression. Baseline: no HEP Goal status: MET  2.  The patient will improve FOTO to 68%. Baseline: 56% at eval; 75% ON 12/29 Goal status: MET  3.  The  patient will improve bilat hip abductor strength to 5/5. Baseline: Met except Left hip ABD 5-/5, ext 4+/5 Goal status:PARTIALY MET 08/13/22   4.  The patient will improve bilat hamstring length to -10 from full extension. Baseline: -20 / -18:  -5 bilaterally Goal status: MET 08/13/22  5.  The patient will tolerate hip flexor stretch without pain R anterior hip. Baseline: + R hip thomas test 07/14/22 Goal status: MET 07/24/22  6.  The patient will be indep with home walking program and community wellness. Baseline:Walking dogs daily, but planning to increase distance and frequency Goal status: MET 08/13/22-- has exercise plan for HEP, walking, gym   PLAN: PT FREQUENCY: 2x/week  PT DURATION: 8 weeks  PLANNED INTERVENTIONS: Therapeutic exercises, Therapeutic activity, Neuromuscular re-education, Balance training, Gait training, Patient/Family education, Self Care, Joint mobilization, Aquatic Therapy, Dry Needling, Electrical stimulation, Spinal mobilization, Moist heat, Taping, and Manual therapy  PLAN FOR NEXT SESSION:  On hold-- has MD appt 08/27/22.    [place], PT 08/13/2022, 11:53 AM

## 2022-08-21 ENCOUNTER — Other Ambulatory Visit: Payer: Self-pay | Admitting: Family Medicine

## 2022-08-21 DIAGNOSIS — G43809 Other migraine, not intractable, without status migrainosus: Secondary | ICD-10-CM

## 2022-08-26 ENCOUNTER — Other Ambulatory Visit: Payer: Self-pay | Admitting: Family Medicine

## 2022-08-26 DIAGNOSIS — F4321 Adjustment disorder with depressed mood: Secondary | ICD-10-CM

## 2022-08-26 DIAGNOSIS — F330 Major depressive disorder, recurrent, mild: Secondary | ICD-10-CM

## 2022-08-27 ENCOUNTER — Ambulatory Visit (INDEPENDENT_AMBULATORY_CARE_PROVIDER_SITE_OTHER): Payer: Managed Care, Other (non HMO) | Admitting: Sports Medicine

## 2022-08-27 ENCOUNTER — Ambulatory Visit (INDEPENDENT_AMBULATORY_CARE_PROVIDER_SITE_OTHER): Payer: Managed Care, Other (non HMO)

## 2022-08-27 DIAGNOSIS — M25551 Pain in right hip: Secondary | ICD-10-CM

## 2022-08-27 DIAGNOSIS — M25552 Pain in left hip: Secondary | ICD-10-CM

## 2022-08-27 NOTE — Progress Notes (Signed)
    Procedures performed today:    None.  Independent interpretation of notes and tests performed by another provider:   None.  Brief History, Exam, Impression, and Recommendations:    Bilateral hip pain This is a very pleasant 52 year old female, she has bilateral hip pain clinically diagnosed as greater trochanteric bursitis with point tenderness over the greater trochanters, I did bilateral greater trochanteric bursa/hip abductor injections at the last visit, she has done some physical therapy, left side is doing pretty well, right side still has significant discomfort. She does have pain in the buttock and point tenderness over the greater trochanter. Anatomically the can represent hip abductor tendinopathy/tearing, it could also be referred from the lumbar spine, due to failure of greater than 6 weeks of conservative treatment including physical therapy we are to proceed with MRI of her hip, as well as x-rays and MRI of her lumbar spine. Continue physical therapy in the meantime.    ____________________________________________ Gwen Her. Dianah Field, M.D., ABFM., CAQSM., AME. Primary Care and Sports Medicine [facility]  Adjunct Professor of [employer]  Risk manager

## 2022-08-27 NOTE — Assessment & Plan Note (Signed)
This is a very pleasant 52 year old female, she has bilateral hip pain clinically diagnosed as greater trochanteric bursitis with point tenderness over the greater trochanters, I did bilateral greater trochanteric bursa/hip abductor injections at the last visit, she has done some physical therapy, left side is doing pretty well, right side still has significant discomfort. She does have pain in the buttock and point tenderness over the greater trochanter. Anatomically the can represent hip abductor tendinopathy/tearing, it could also be referred from the lumbar spine, due to failure of greater than 6 weeks of conservative treatment including physical therapy we are to proceed with MRI of her hip, as well as x-rays and MRI of her lumbar spine. Continue physical therapy in the meantime.

## 2022-09-05 ENCOUNTER — Encounter: Payer: Self-pay | Admitting: Family Medicine

## 2022-09-05 ENCOUNTER — Ambulatory Visit (INDEPENDENT_AMBULATORY_CARE_PROVIDER_SITE_OTHER): Payer: Managed Care, Other (non HMO) | Admitting: Family Medicine

## 2022-09-05 VITALS — BP 101/48 | HR 63 | Ht 64.0 in | Wt 195.0 lb

## 2022-09-05 DIAGNOSIS — R748 Abnormal levels of other serum enzymes: Secondary | ICD-10-CM | POA: Diagnosis not present

## 2022-09-05 DIAGNOSIS — Z0000 Encounter for general adult medical examination without abnormal findings: Secondary | ICD-10-CM | POA: Diagnosis not present

## 2022-09-05 DIAGNOSIS — G43809 Other migraine, not intractable, without status migrainosus: Secondary | ICD-10-CM | POA: Diagnosis not present

## 2022-09-05 MED ORDER — RIZATRIPTAN BENZOATE 10 MG PO TBDP: 10.0000 mg | ORAL_TABLET | ORAL | 5 refills | Status: DC | PRN

## 2022-09-05 MED ORDER — ONDANSETRON 4 MG PO TBDP: 4.0000 mg | ORAL_TABLET | Freq: Three times a day (TID) | ORAL | 0 refills | Status: DC | PRN

## 2022-09-05 NOTE — Progress Notes (Signed)
Complete physical exam  Patient: Laura [name]   DOB: 08-23-1970   52 y.o. Female  MRN: [id number]  Subjective:    Chief Complaint  Patient presents with   Annual Exam    Jozie Wulf is a 52 y.o. female who presents today for a complete physical exam. She reports consuming a general diet. The patient does not participate in regular exercise at present. She generally feels well. She reports sleeping fairly well. She doesn't have additional problems to discuss today.    Most recent fall risk assessment:    09/05/2022    2:40 PM  Fall Risk   Falls in the past year? 1  Number falls in past yr: 0  Injury with Fall? 0  Risk for fall due to : History of fall(s)  Follow up Falls evaluation completed     Most recent depression screenings:    09/05/2022    2:39 PM 04/28/2022    9:11 AM  PHQ 2/9 Scores  PHQ - 2 Score 0 0  PHQ- 9 Score 0          Patient Care Team: Hali Marry, MD as PCP - General (Family Medicine) Cox, De Hollingshead, MD as Referring Physician (Nurse Practitioner) Erle Crocker, MD as Consulting Physician (Orthopedic Surgery)   Outpatient Medications Prior to Visit  Medication Sig   albuterol (VENTOLIN HFA) 108 (90 Base) MCG/ACT inhaler Inhale 2 puffs into the lungs every 6 (six) hours as needed for wheezing or shortness of breath.   BIOTIN 5000 PO Take 5,000 mcg by mouth daily.   Calcium Carbonate (CALCIUM 600 PO) Take 600 mg by mouth daily.   Cholecalciferol 50 MCG (2000 UT) TABS Take 2,000 Units by mouth daily.   clobetasol ointment (TEMOVATE) 3.90 % Apply 1 application topically 2 (two) times daily as needed (eczema).   dicyclomine (BENTYL) 20 MG tablet Take 20 mg by mouth 3 (three) times daily.   escitalopram (LEXAPRO) 10 MG tablet TAKE 1 TABLET BY MOUTH EVERY DAY   famotidine (PEPCID) 20 MG tablet Take 20 mg by mouth at bedtime as needed for heartburn or indigestion.   fluticasone (FLONASE) 50 MCG/ACT nasal spray Place 2 sprays into both  nostrils daily. (Patient taking differently: Place 2 sprays into both nostrils daily as needed for allergies.)   ipratropium (ATROVENT) 0.06 % nasal spray INSTILL 2 SPRAYS INTO BOTH NOSTRILS FOUR TIMES A DAY   omeprazole (PRILOSEC) 40 MG capsule Take 40 mg by mouth in the morning and at bedtime.   Prenatal Vit-Fe Fumarate-FA (PRENATAL VITAMIN PO) Take 1 tablet by mouth daily.   topiramate (TOPAMAX) 100 MG tablet Take 1 tablet (100 mg total) by mouth 2 (two) times daily.   WEGOVY 2.4 MG/0.75ML SOAJ Inject 2.4 mg into the skin every Saturday.   [DISCONTINUED] ondansetron (ZOFRAN ODT) 4 MG disintegrating tablet Take 1 tablet (4 mg total) by mouth every 8 (eight) hours as needed for nausea or vomiting.   [DISCONTINUED] rizatriptan (MAXALT-MLT) 10 MG disintegrating tablet Take 10 mg by mouth as needed for migraine. May repeat in 2 hours if needed   No facility-administered medications prior to visit.    ROS        Objective:     BP (!) 101/48   Pulse 63   Ht '5\' 4"'$  (1.626 m)   Wt 195 lb (88.5 kg)   LMP 02/12/2019   SpO2 98%   BMI 33.47 kg/m     Physical Exam Vitals and nursing note reviewed.  Constitutional:      Appearance: She is well-developed.  HENT:     Head: Normocephalic and atraumatic.     Right Ear: Tympanic membrane, ear canal and external ear normal.     Left Ear: Tympanic membrane, ear canal and external ear normal.     Nose: Nose normal.     Mouth/Throat:     Pharynx: Oropharynx is clear.  Eyes:     Conjunctiva/sclera: Conjunctivae normal.     Pupils: Pupils are equal, round, and reactive to light.  Neck:     Thyroid: No thyromegaly.  Cardiovascular:     Rate and Rhythm: Normal rate and regular rhythm.     Heart sounds: Normal heart sounds.  Pulmonary:     Effort: Pulmonary effort is normal.     Breath sounds: Normal breath sounds. No wheezing.  Abdominal:     General: Bowel sounds are normal.     Palpations: Abdomen is soft.  Musculoskeletal:      Cervical back: Neck supple.  Lymphadenopathy:     Cervical: No cervical adenopathy.  Skin:    General: Skin is warm and dry.     Coloration: Skin is not pale.  Neurological:     Mental Status: She is alert and oriented to person, place, and time.  Psychiatric:        Behavior: Behavior normal.      No results found for any visits on 09/05/22.      Assessment & Plan:    Routine Health Maintenance and Physical Exam  Immunization History  Administered Date(s) Administered   Influenza Inj Mdck Quad Pf 06/05/2017   Influenza Split 07/31/2012, 05/11/2014   Influenza Whole 06/05/2009   Influenza,inj,Quad PF,6+ Mos 05/11/2014, 05/30/2015, 07/18/2016, 05/11/2017, 06/21/2018, 04/28/2019, 05/01/2020, 07/02/2021, 04/28/2022   Influenza-Unspecified 06/21/2018   PFIZER(Purple Top)SARS-COV-2 Vaccination 09/05/2019, 09/26/2019   PPD Test 04/28/2022   Tdap 06/19/2014   Zoster Recombinat (Shingrix) 10/29/2020, 04/16/2021    Health Maintenance  Topic Date Due   HIV Screening  Never done   Hepatitis C Screening  Never done   COVID-19 Vaccine (3 - Pfizer risk series) 10/24/2019   PAP SMEAR-Modifier  07/12/2023   COLONOSCOPY (Pts 45-29yr Insurance coverage will need to be confirmed)  08/07/2023   MAMMOGRAM  08/23/2023   DTaP/Tdap/Td (2 - Td or Tdap) 06/19/2024   INFLUENZA VACCINE  Completed   Zoster Vaccines- Shingrix  Completed   Pneumococcal Vaccine [id number] old  Aged Out   HPV VACCINES  Aged Out    Discussed health benefits of physical activity, and encouraged her to engage in regular exercise appropriate for her age and condition.  Problem List Items Addressed This Visit       Cardiovascular and Mediastinum   Migraine   Relevant Medications   ondansetron (ZOFRAN ODT) 4 MG disintegrating tablet   rizatriptan (MAXALT-MLT) 10 MG disintegrating tablet   Other Visit Diagnoses     Wellness examination    -  Primary   Elevated liver enzymes       Relevant Orders   Iron,  TIBC and Ferritin Panel   Tissue Transglutaminase Abs,IgG,IgA   Protime-INR   Mitochondrial antibodies   Anti-smooth muscle antibody, IgG   ANA   IgA   CBC   Hepatic function panel       Keep up a regular exercise program and make sure you are eating a healthy diet Try to eat 4 servings of dairy a day, or if you are lactose intolerant take a  calcium with vitamin D daily.  Your vaccines are up to date.   Return in about 6 months (around 03/06/2023) for bp .     Beatrice Lecher, MD

## 2022-09-20 ENCOUNTER — Ambulatory Visit (INDEPENDENT_AMBULATORY_CARE_PROVIDER_SITE_OTHER): Payer: Managed Care, Other (non HMO)

## 2022-09-20 DIAGNOSIS — M25552 Pain in left hip: Secondary | ICD-10-CM | POA: Diagnosis not present

## 2022-09-20 DIAGNOSIS — M25551 Pain in right hip: Secondary | ICD-10-CM

## 2022-09-20 DIAGNOSIS — G8929 Other chronic pain: Secondary | ICD-10-CM | POA: Diagnosis not present

## 2022-09-20 DIAGNOSIS — M5416 Radiculopathy, lumbar region: Secondary | ICD-10-CM | POA: Diagnosis not present

## 2022-09-22 ENCOUNTER — Encounter: Payer: Self-pay | Admitting: Sports Medicine

## 2022-09-22 DIAGNOSIS — M25552 Pain in left hip: Secondary | ICD-10-CM

## 2022-09-22 DIAGNOSIS — M25551 Pain in right hip: Secondary | ICD-10-CM

## 2022-09-25 MED ORDER — TRAMADOL HCL 50 MG PO TABS: 50.0000 mg | ORAL_TABLET | Freq: Three times a day (TID) | ORAL | 0 refills | Status: DC | PRN

## 2022-10-01 ENCOUNTER — Telehealth (INDEPENDENT_AMBULATORY_CARE_PROVIDER_SITE_OTHER): Payer: Managed Care, Other (non HMO) | Admitting: Physician Assistant

## 2022-10-01 ENCOUNTER — Encounter: Payer: Self-pay | Admitting: Physician Assistant

## 2022-10-01 ENCOUNTER — Other Ambulatory Visit: Payer: Self-pay | Admitting: Physician Assistant

## 2022-10-01 VITALS — Temp 97.6°F

## 2022-10-01 DIAGNOSIS — U071 COVID-19: Secondary | ICD-10-CM

## 2022-10-01 DIAGNOSIS — J069 Acute upper respiratory infection, unspecified: Secondary | ICD-10-CM

## 2022-10-01 MED ORDER — NIRMATRELVIR/RITONAVIR (PAXLOVID)TABLET: 3.0000 | ORAL_TABLET | Freq: Two times a day (BID) | ORAL | 0 refills | Status: AC

## 2022-10-01 MED ORDER — ALBUTEROL SULFATE HFA 108 (90 BASE) MCG/ACT IN AERS: 2.0000 | INHALATION_SPRAY | Freq: Four times a day (QID) | RESPIRATORY_TRACT | 0 refills | Status: DC | PRN

## 2022-10-01 NOTE — Progress Notes (Signed)
.Virtual Visit via Video Note  I connected with [name] on 10/01/22 at 11:30 AM EST by a video enabled telemedicine application and verified that I am speaking with the correct person using two identifiers.  Location: Patient: home Provider: clinic  .Marland KitchenParticipating in visit:  Patient: Laura [name] Provider: Iran Planas PA-C   I discussed the limitations of evaluation and management by telemedicine and the availability of in person appointments. The patient expressed understanding and agreed to proceed.  History of Present Illness: Pt is a 52 yo female who tested positive for covid this morning. Symptoms started last night with headache. Now she has congestion, cough, headache, sinus pressure. She has multiple home covid sick contacts. She has had 2 covid shots and booster. She has also had covid before. No SOB but her chest does feel tight. No fever. She has taken some OTC medications today with some relief.   .. Active Ambulatory Problems    Diagnosis Date Noted   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/19/2006   OBESITY, NOS 05/19/2006   Major depressive disorder, recurrent episode ([place]) 05/19/2006   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   BACK PAIN, THORACIC REGION 07/25/2009   Sebaceous hyperplasia 10/24/2011   Menopausal symptom 05/18/2012   Leg swelling 05/18/2012   GERD (gastroesophageal reflux disease) 06/19/2014   History of continuous positive airway pressure (CPAP) therapy at home 06/19/2014   History of bariatric surgery 02/06/2015   Post-traumatic osteoarthritis of left knee 02/15/2015   Radiculitis of left cervical region 08/09/2015   Menorrhagia with irregular cycle 08/14/2015   Uterine fibroid 08/15/2015   Low back pain 09/05/2015   Bilateral hip pain 10/17/2016   Pain and swelling of right knee 12/20/2018   Intractable right heel pain 12/20/2018   Migraine 04/28/2019   Situational depression 04/28/2019   Iron deficiency 10/26/2019   H/O metal allergy  05/01/2020   Insomnia 05/01/2020   Incisional hernia 07/01/2021   Right upper quadrant pain 08/26/2021   Diarrhea 08/26/2021   Nausea 08/26/2021   Family history of colon cancer 01/19/2019   Heart palpitations 12/07/2013   History of kidney stones 01/28/2012   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   Hx of adenomatous colonic polyps 01/19/2019   Hx of gastric bypass 06/29/2019   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   Irregular periods 04/30/2022   Left flank pain 01/28/2012   Left ureteral stone 04/11/2016   Microscopic hematuria 01/28/2012   Mixed hyperlipidemia 04/10/2014   Need for immunization against influenza 05/11/2014   OSA on CPAP 06/19/2014   Other fatigue 05/11/2014   Protein deficiency ([place]) 12/03/2017   Psychological factors affecting morbid obesity ([place]) 01/27/2018   Snoring 05/11/2014   Vitamin D deficiency 01/11/2014   Resolved Ambulatory Problems    Diagnosis Date Noted   NEVUS, ATYPICAL 03/06/2010   Acute pharyngitis 07/25/2009   Acute bronchitis 02/19/2010   ALLERGIC RHINITIS CAUSE UNSPECIFIED 06/03/2010   PNEUMONIA, ATYPICAL 04/23/2010   BRONCHITIS, ALLERGIC 06/03/2010   Cellulitis and abscess of unspecified site 02/19/2010   SKIN TAG 11/29/2008   Trochanteric bursitis of both hips 02/15/2015   Otitis, externa, infective 04/26/2015   Thoracic back pain 08/09/2015   Primary osteoarthritis of left hip 11/14/2016   Tendinitis of right hip flexor 12/14/2017   Stress fracture of navicular bone of left foot 02/15/2019   Fall 02/28/2019   Ventral hernia with obstruction and without gangrene 02/21/2020   Pre-op evaluation 02/21/2020   Infected sebaceous cyst of right posterior shoulder 01/22/2021   Itching 02/20/2021   No appetite 08/26/2021  Past Medical History:  Diagnosis Date   Allergic rhinitis    Arthritis    Depression    Dyslipidemia    Dysmenorrhea    Family history of adverse reaction to anesthesia    Gall bladder disease    Headache     Infertility, female    Obesity    Pneumonia    Polycystic ovary disease    PONV (postoperative nausea and vomiting)    Sleep apnea    WPW (Wolff-Parkinson-White syndrome)        Observations/Objective: No acute distress Normal breathing Productive cough Pale appearance with watery bilateral eyes  .Marland Kitchen Today's Vitals   10/01/22 1027  Temp: 97.6 F (36.4 C)  TempSrc: Temporal  SpO2: 97%   There is no height or weight on file to calculate BMI.    Assessment and Plan: Marland KitchenMarland KitchenChanon was seen today for covid positive.  Diagnoses and all orders for this visit:  COVID-19 virus infection -     nirmatrelvir/ritonavir (PAXLOVID) 20 x 150 MG & 10 x 100MG TABS; Take 3 tablets by mouth 2 (two) times daily for 5 days. (Take nirmatrelvir 150 mg two tablets twice daily for 5 days and ritonavir 100 mg one tablet twice daily for 5 days) Patient GFR is 85 -     albuterol (VENTOLIN HFA) 108 (90 Base) MCG/ACT inhaler; Inhale 2 puffs into the lungs every 6 (six) hours as needed for wheezing or shortness of breath.  Viral URI with cough -     nirmatrelvir/ritonavir (PAXLOVID) 20 x 150 MG & 10 x 100MG TABS; Take 3 tablets by mouth 2 (two) times daily for 5 days. (Take nirmatrelvir 150 mg two tablets twice daily for 5 days and ritonavir 100 mg one tablet twice daily for 5 days) Patient GFR is 85 -     albuterol (VENTOLIN HFA) 108 (90 Base) MCG/ACT inhaler; Inhale 2 puffs into the lungs every 6 (six) hours as needed for wheezing or shortness of breath.  Day 1 of Covid Written out of work until 2/26 then mask until 3/2 Paxlovid started and albuterol as needed Ok to continue OTC medications Ok to continue vitamin C/D and zinc Follow up with any new or worsening symptoms    Follow Up Instructions:    I discussed the assessment and treatment plan with the patient. The patient was provided an opportunity to ask questions and all were answered. The patient agreed with the plan and demonstrated an  understanding of the instructions.   The patient was advised to call back or seek an in-person evaluation if the symptoms worsen or if the condition fails to improve as anticipated.   Iran Planas, PA-C

## 2022-10-01 NOTE — Telephone Encounter (Signed)
Okay to change? 

## 2022-10-02 ENCOUNTER — Other Ambulatory Visit: Payer: Self-pay | Admitting: Physician Assistant

## 2022-10-02 DIAGNOSIS — U071 COVID-19: Secondary | ICD-10-CM

## 2022-10-02 DIAGNOSIS — J069 Acute upper respiratory infection, unspecified: Secondary | ICD-10-CM

## 2022-10-02 NOTE — Telephone Encounter (Signed)
Already sent RX for this

## 2022-10-08 ENCOUNTER — Encounter: Payer: Self-pay | Admitting: Rehabilitative and Restorative Service Providers"

## 2022-10-08 ENCOUNTER — Other Ambulatory Visit: Payer: Self-pay

## 2022-10-08 ENCOUNTER — Ambulatory Visit
Payer: Managed Care, Other (non HMO) | Attending: Sports Medicine | Admitting: Rehabilitative and Restorative Service Providers"

## 2022-10-08 DIAGNOSIS — M25552 Pain in left hip: Secondary | ICD-10-CM | POA: Insufficient documentation

## 2022-10-08 DIAGNOSIS — M25551 Pain in right hip: Secondary | ICD-10-CM | POA: Insufficient documentation

## 2022-10-08 DIAGNOSIS — M6281 Muscle weakness (generalized): Secondary | ICD-10-CM | POA: Diagnosis present

## 2022-10-08 NOTE — Therapy (Signed)
OUTPATIENT PHYSICAL THERAPY LOWER EXTREMITY EVALUATION   Patient Name: Laura [name] MRN: [id number] DOB:28-Aug-1970, 52 y.o., female Today's Date: 10/08/2022   PT End of Session - 10/08/22 1016     Visit Number 1    Number of Visits 16    Date for PT Re-Evaluation 12/07/22    Authorization Type cigna    PT Start Time 1017    PT Stop Time 1100    PT Time Calculation (min) 43 min    Activity Tolerance Patient tolerated treatment well    Behavior During Therapy WFL for tasks assessed/performed             Past Medical History:  Diagnosis Date   Allergic rhinitis    Arthritis    Depression    Dyslipidemia    Dysmenorrhea    Family history of adverse reaction to anesthesia    mother slow to wake up   Gall bladder disease    GERD (gastroesophageal reflux disease)    Headache    History of kidney stones    Infertility, female    Obesity    Pneumonia    Polycystic ovary disease    PONV (postoperative nausea and vomiting)    Sebaceous hyperplasia     on tretinoin   Sleep apnea    cpap   WPW (Wolff-Parkinson-White syndrome)    hx of ablation 2012   Past Surgical History:  Procedure Laterality Date   CESAREAN SECTION     x 2   gastric bypass surgery      INCISIONAL HERNIA REPAIR N/A 07/01/2021   Procedure: INCISIONAL HERNIA REPAIR WITH MESH;  Surgeon: Kinsinger, Arta Bruce, MD;  Location: [facility];  Service: General;  Laterality: N/A;   LAPAROSCOPIC CHOLECYSTECTOMY     left foot surgery      SLEEVE GASTROPLASTY     SUPRAVENTRICULAR TACHYCARDIA ABLATION N/A 07/23/2011   Procedure: SUPRAVENTRICULAR TACHYCARDIA ABLATION;  Surgeon: Evans Lance, MD;  Location: [facility] CATH LAB;  Service: Cardiovascular;  Laterality: N/A;   transthoracic echcardiogram  09/15/2006   Patient Active Problem List   Diagnosis Date Noted   Irregular periods 04/30/2022   Right upper quadrant pain 08/26/2021   Diarrhea 08/26/2021   Nausea 08/26/2021   Incisional hernia 07/01/2021   H/O  metal allergy 05/01/2020   Insomnia 05/01/2020   Iron deficiency 10/26/2019   Hx of gastric bypass 06/29/2019   Migraine 04/28/2019   Situational depression 04/28/2019   Family history of colon cancer 01/19/2019   Hx of adenomatous colonic polyps 01/19/2019   Pain and swelling of right knee 12/20/2018   Intractable right heel pain 12/20/2018   Psychological factors affecting morbid obesity ([place]) 01/27/2018   Protein deficiency ([place]) 12/03/2017   Bilateral hip pain 10/17/2016   Left ureteral stone 04/11/2016   Low back pain 09/05/2015   Uterine fibroid 08/15/2015   Menorrhagia with irregular cycle 08/14/2015   Radiculitis of left cervical region 08/09/2015   Post-traumatic osteoarthritis of left knee 02/15/2015   History of bariatric surgery 02/06/2015   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   GERD (gastroesophageal reflux disease) 06/19/2014   History of continuous positive airway pressure (CPAP) therapy at home 06/19/2014   OSA on CPAP 06/19/2014   Need for immunization against influenza 05/11/2014   Other fatigue 05/11/2014   Snoring 05/11/2014   Mixed hyperlipidemia 04/10/2014   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   Vitamin D deficiency 01/11/2014   Heart palpitations 12/07/2013   Menopausal symptom 05/18/2012   Leg swelling 05/18/2012  History of kidney stones 01/28/2012   Left flank pain 01/28/2012   Microscopic hematuria 01/28/2012   Sebaceous hyperplasia 10/24/2011   BACK PAIN, THORACIC REGION 07/25/2009   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/19/2006   OBESITY, NOS 05/19/2006   Major depressive disorder, recurrent episode ([place]) 05/19/2006   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    PCP: Beatrice Lecher, MD REFERRING PROVIDER: Aundria Mems, MD REFERRING DIAG: [phone] (ICD-10-CM) - Bilateral hip pain  THERAPY DIAG:  Pain in right hip  Pain in left hip  Muscle weakness (generalized)  Rationale for Evaluation and Treatment  Rehabilitation  ONSET DATE: 05/19/22  SUBJECTIVE:   SUBJECTIVE STATEMENT: The patient reports she had a recent MRI of the R hip indicating a partial tear. She is known to our clinic from prior PT and returns due to bilateral hip pain that is chronic in nature. She recently had COVID and has spent the last 2 weeks in bed. Pain wakes her up at night, walking aggravates pain. Marland Kitchen  PERTINENT HISTORY: Long standing h/o bilateral hip pain after childbirth, weight loss s/p bariatric surgery 3 years ago, [employer] with cardiac ablation  PAIN:  Are you having pain? Yes: NPRS scale: 3/10 Pain location: bilateral hip pain Pain description: sharp Aggravating factors: goes to an 8/10 when she wakes up at night and when first rising in the morning Relieving factors: pain meds , stretching helps Gets some pain into the sacrum and has pain across her gluts bilaterally  PRECAUTIONS: None  WEIGHT BEARING RESTRICTIONS No  FALLS:  Has patient fallen in last 6 months? 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 Also covering nights at the [place].  PLOF: Independent  PATIENT GOALS Strengthen hips to reduce pain  OBJECTIVE:   PATIENT SURVEYS:  FOTO 62% (goal 67%)  SENSATION: WFL  MUSCLE LENGTH: Hamstrings: Right -15 deg; Left -22 deg Thomas test: Right WNLs ROM, but pain over greater trochanter  POSTURE: WNLs.   PALPATION: Bilateral pain along lateral aspect, IT Band, lateral SI joint Hypomobility noted with PA mobs in thoracic and lumbar spine-- general tightness  LOWER EXTREMITY ROM:    Lumbar A/ROM: Pain at end range flexion (in L5/S1 region) and sensation of "tight" Extension: painful with 50%  reduced motion L SB= R side discomfort R SB= no pain WNLs   LOWER EXTREMITY MMT:  MMT Right eval Left eval  Hip flexion 5/5 5/5  Hip extension 4/5 5/5  Hip abduction 3/5 With pain 4-/5  Hip adduction 3+/5 3+/5  Hip internal rotation 5/5 5/5  Hip external rotation 5/5 5/5  Knee flexion 5/5 5/5  Knee extension 5/5 5/5  Ankle dorsiflexion 4+/5 5/5  Ankle plantarflexion    Ankle inversion    Ankle eversion     (Blank rows = not tested)  LOWER EXTREMITY SPECIAL TESTS:  Squat-- at wall, painful bilateral knees Standing heel raises can do bilateral, not unilateral  GAIT: Distance walked: 100 ft Comments: gait characterized by antalgic pattern after sitting   [facility] Adult PT Treatment:  DATE: 10/08/22 Therapeutic Exercise: Prone :Windshield wipers, On elbows with press ups Quadriped: Cat/cow x 10 reps and Primal push ups x 5 reps  PATIENT EDUCATION:  Education details: HEP Person educated: Patient Education method: Consulting civil engineer, Systems developer, handout Education comprehension: verbalized understanding and returned demonstration  HOME EXERCISE PROGRAM: Access Code: [email or web address] URL: [email or web address] Date: 10/08/2022 Prepared by: Rudell Cobb  Exercises - Prone Press Up  - 1 x daily - 7 x weekly - 1 sets - 10 reps - Cat Cow  - 1 x daily - 7 x weekly - 1 sets - 5-10 reps - Primal Push Up  - 1 x daily - 7 x weekly - 1 sets - 5 reps - Standing Heel Raises  - 1 x daily - 7 x weekly - 1 sets - 10-15 reps  ASSESSMENT:  CLINICAL IMPRESSION: Patient is a 52 y.o. female who was seen today for physical therapy evaluation and treatment for chronic bilateral hip pain.   She presents with impairments in hip ab/adductor strength, HS flexibility, hypomobility spine, pain along lateral border of SI joint, and point tenderness/pain with palpation.  PT to address deficits to promote improved mobility.  OBJECTIVE IMPAIRMENTS Abnormal  gait, decreased activity tolerance, difficulty walking, decreased strength, hypomobility, increased fascial restrictions, impaired flexibility, and pain.   ACTIVITY LIMITATIONS sleeping, bed mobility, and locomotion level  PARTICIPATION LIMITATIONS: community activity  PERSONAL FACTORS Time since onset of injury/illness/exacerbation are also affecting patient's functional outcome.   REHAB POTENTIAL: Good  CLINICAL DECISION MAKING: Stable/uncomplicated  EVALUATION COMPLEXITY: Low   GOALS: Goals reviewed with patient? Yes  SHORT TERM GOALS: Target date: 11/05/22  The patient will be indep with HEP Baseline:modifying prior HEP Goal status: INITIAL  2.  The patient will report pain < or equal to 2/10 Baseline: 3/10, varies up to 8/10 Goal status: INITIAL  4.  The patient will improve R hip abductor strength to 4/5. Baseline: 3/5 Goal status: INITIAL  LONG TERM GOALS: Target date: 12/03/22  The patient will be indep with HEP progression. Baseline: for d/c Goal status: INITIAL  2.  The patient will improve FOTO to 67%. Baseline: 62% Goal status: INITIAL  3.  The patient will improve bilat hip abductor strength to 5/5. Baseline: see MMT chart Goal status: INITIAL  4.  The patient will improve bilat hamstring length to -10 from full extension. Baseline: -22 / -15 Goal status: INITIAL  5.  The patient will tolerate forward trunk flexion without pain. Baseline: end range pain Goal status: INITIAL  6.  The patient will be indep with home walking program and community wellness. Baseline: Member at [employer] Goal status: INITIAL   PLAN: PT FREQUENCY: 2x/week  PT DURATION: 8 weeks  PLANNED INTERVENTIONS: Therapeutic exercises, Therapeutic activity, Neuromuscular re-education, Balance training, Gait training, Patient/Family education, Self Care, Joint mobilization, Aquatic Therapy, Dry Needling, Electrical stimulation, Spinal mobilization, Moist heat, Taping, and  Manual therapy  PLAN FOR NEXT SESSION: Progress LE strengthening, core stability, spinal mobility, and tightness. STM for self mgmt of pain.    [place], PT 10/08/2022, 12:15 PM

## 2022-10-14 ENCOUNTER — Ambulatory Visit: Payer: Managed Care, Other (non HMO) | Attending: Sports Medicine

## 2022-10-14 DIAGNOSIS — M25552 Pain in left hip: Secondary | ICD-10-CM

## 2022-10-14 DIAGNOSIS — M6281 Muscle weakness (generalized): Secondary | ICD-10-CM

## 2022-10-14 DIAGNOSIS — M25551 Pain in right hip: Secondary | ICD-10-CM

## 2022-10-14 NOTE — Therapy (Signed)
OUTPATIENT PHYSICAL THERAPY LOWER EXTREMITY TREATMENT   Patient Name: Laura [name] MRN: [id number] DOB:August 11, 1971, 52 y.o., female Today's Date: 10/14/2022   PT End of Session - 10/14/22 0934     Visit Number 2    Number of Visits 16    Date for PT Re-Evaluation 12/07/22    Authorization Type cigna    PT Start Time 0935    PT Stop Time [id number]    PT Time Calculation (min) 40 min    Activity Tolerance Patient tolerated treatment well    Behavior During Therapy WFL for tasks assessed/performed             Past Medical History:  Diagnosis Date   Allergic rhinitis    Arthritis    Depression    Dyslipidemia    Dysmenorrhea    Family history of adverse reaction to anesthesia    mother slow to wake up   Gall bladder disease    GERD (gastroesophageal reflux disease)    Headache    History of kidney stones    Infertility, female    Obesity    Pneumonia    Polycystic ovary disease    PONV (postoperative nausea and vomiting)    Sebaceous hyperplasia     on tretinoin   Sleep apnea    cpap   WPW (Wolff-Parkinson-White syndrome)    hx of ablation 2012   Past Surgical History:  Procedure Laterality Date   CESAREAN SECTION     x 2   gastric bypass surgery      INCISIONAL HERNIA REPAIR N/A 07/01/2021   Procedure: INCISIONAL HERNIA REPAIR WITH MESH;  Surgeon: Kinsinger, Arta Bruce, MD;  Location: [facility];  Service: General;  Laterality: N/A;   LAPAROSCOPIC CHOLECYSTECTOMY     left foot surgery      SLEEVE GASTROPLASTY     SUPRAVENTRICULAR TACHYCARDIA ABLATION N/A 07/23/2011   Procedure: SUPRAVENTRICULAR TACHYCARDIA ABLATION;  Surgeon: Evans Lance, MD;  Location: [facility] CATH LAB;  Service: Cardiovascular;  Laterality: N/A;   transthoracic echcardiogram  09/15/2006   Patient Active Problem List   Diagnosis Date Noted   Irregular periods 04/30/2022   Right upper quadrant pain 08/26/2021   Diarrhea 08/26/2021   Nausea 08/26/2021   Incisional hernia 07/01/2021   H/O metal  allergy 05/01/2020   Insomnia 05/01/2020   Iron deficiency 10/26/2019   Hx of gastric bypass 06/29/2019   Migraine 04/28/2019   Situational depression 04/28/2019   Family history of colon cancer 01/19/2019   Hx of adenomatous colonic polyps 01/19/2019   Pain and swelling of right knee 12/20/2018   Intractable right heel pain 12/20/2018   Psychological factors affecting morbid obesity ([place]) 01/27/2018   Protein deficiency ([place]) 12/03/2017   Bilateral hip pain 10/17/2016   Left ureteral stone 04/11/2016   Low back pain 09/05/2015   Uterine fibroid 08/15/2015   Menorrhagia with irregular cycle 08/14/2015   Radiculitis of left cervical region 08/09/2015   Post-traumatic osteoarthritis of left knee 02/15/2015   History of bariatric surgery 02/06/2015   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   GERD (gastroesophageal reflux disease) 06/19/2014   History of continuous positive airway pressure (CPAP) therapy at home 06/19/2014   OSA on CPAP 06/19/2014   Need for immunization against influenza 05/11/2014   Other fatigue 05/11/2014   Snoring 05/11/2014   Mixed hyperlipidemia 04/10/2014   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   Vitamin D deficiency 01/11/2014   Heart palpitations 12/07/2013   Menopausal symptom 05/18/2012   Leg swelling 05/18/2012  History of kidney stones 01/28/2012   Left flank pain 01/28/2012   Microscopic hematuria 01/28/2012   Sebaceous hyperplasia 10/24/2011   BACK PAIN, THORACIC REGION 07/25/2009   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/19/2006   OBESITY, NOS 05/19/2006   Major depressive disorder, recurrent episode ([place]) 05/19/2006   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    PCP: Beatrice Lecher, MD REFERRING PROVIDER: Aundria Mems, MD REFERRING DIAG: [phone] (ICD-10-CM) - Bilateral hip pain  THERAPY DIAG:  Pain in right hip  Pain in left hip  Muscle weakness (generalized)  Rationale for Evaluation and Treatment Rehabilitation  ONSET  DATE: 05/19/22  SUBJECTIVE:   SUBJECTIVE STATEMENT: Patient reports she is very sore today in bilateral hips due to sitting for a long time with driving. Patient states she was in [facility] for a funeral and did not have time to review HEP.    PERTINENT HISTORY: Long standing h/o bilateral hip pain after childbirth, weight loss s/p bariatric surgery 3 years ago, [employer] with cardiac ablation  PAIN:  Are you having pain? Yes: NPRS scale: 3/10 Pain location: bilateral hip pain Pain description: sharp Aggravating factors: goes to an 8/10 when she wakes up at night and when first rising in the morning Relieving factors: pain meds , stretching helps Gets some pain into the sacrum and has pain across her gluts bilaterally  PRECAUTIONS: None  WEIGHT BEARING RESTRICTIONS No  FALLS:  Has patient fallen in last 6 months? 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 Also covering nights at the [place].  PLOF: Independent  PATIENT GOALS Strengthen hips to reduce pain  OBJECTIVE:   PATIENT SURVEYS:  FOTO 62% (goal 67%)  SENSATION: WFL  MUSCLE LENGTH: Hamstrings: Right -15 deg; Left -22 deg Thomas test: Right WNLs ROM, but pain over greater trochanter  POSTURE: WNLs.   PALPATION: Bilateral pain along lateral aspect, IT Band, lateral SI joint Hypomobility noted with PA mobs in thoracic and lumbar spine-- general tightness  LOWER EXTREMITY ROM:    Lumbar A/ROM: Pain at end range flexion (in L5/S1 region) and sensation of "tight" Extension: painful with 50% reduced motion L SB= R side discomfort R SB= no pain WNLs   LOWER EXTREMITY MMT:  MMT Right eval Left eval  Hip flexion 5/5 5/5  Hip  extension 4/5 5/5  Hip abduction 3/5 With pain 4-/5  Hip adduction 3+/5 3+/5  Hip internal rotation 5/5 5/5  Hip external rotation 5/5 5/5  Knee flexion 5/5 5/5  Knee extension 5/5 5/5  Ankle dorsiflexion 4+/5 5/5  Ankle plantarflexion    Ankle inversion    Ankle eversion     (Blank rows = not tested)  LOWER EXTREMITY SPECIAL TESTS:  Squat-- at wall, painful bilateral knees Standing heel raises can do bilateral, not unilateral  GAIT: Distance walked: 100 ft Comments: gait characterized by antalgic pattern after sitting    [facility] Adult PT Treatment:                                                DATE: 10/14/2022 Therapeutic Exercise: Supine: Hip add (coregeous ball) & abd (gait belt) isometrics  10x5" each LTR w/ coregeous ball b/w knees x10 Bridges + hip add isometric x10 Prone press up x10 Standing roll down + hip add ball squeeze Cat/cow Quadruped + straight leg raises (hip ext) x10 B Captain morgan standing --> sitting on stool 10x5" B    [facility] Adult PT Treatment:                                                DATE: 10/08/22 Therapeutic Exercise: Prone :Windshield wipers, On elbows with press ups Quadriped: Cat/cow x 10 reps and Primal push ups x 5 reps  PATIENT EDUCATION:  Education details: Progressing HEP Person educated: Patient Education method: Consulting civil engineer, demo, handout Education comprehension: verbalized understanding and returned demonstration  HOME EXERCISE PROGRAM: Access Code: [email or web address] URL: [email or web address] Date: 10/14/2022 Prepared by: Helane Gunther  Exercises - Prone Press Up  - 1 x daily - 7 x weekly - 1 sets - 10 reps - Cat Cow  - 1 x daily - 7 x weekly - 1 sets - 5-10 reps - Primal Push Up  - 1 x daily - 7 x weekly - 1 sets - 5 reps - Standing Heel Raises  - 1 x daily - 7 x weekly - 1 sets - 10-15 reps - Supine Hip Adduction Isometric with Ball  - 1 x daily - 7 x weekly - 3 sets - 10 reps - Bird Dog  - 1 x daily - 7 x weekly  - 3 sets - 10 reps - Glute Med Wall Lean  - 1 x daily - 7 x weekly - 3 sets - 10 reps  ASSESSMENT:  CLINICAL IMPRESSION: Isometric hip exercises incorporated to progress pelvic stability; moderate fatigue noted with hip add activation. Cueing improved TA activation and core stabilization during standing forward flexion and exercises in quadruped.    OBJECTIVE IMPAIRMENTS Abnormal gait, decreased activity tolerance, difficulty walking, decreased strength, hypomobility, increased fascial restrictions, impaired flexibility, and pain.   ACTIVITY LIMITATIONS sleeping, bed mobility, and locomotion level  PARTICIPATION LIMITATIONS: community activity  PERSONAL FACTORS Time since onset of injury/illness/exacerbation are also affecting patient's functional outcome.   REHAB POTENTIAL: Good  CLINICAL DECISION MAKING: Stable/uncomplicated  EVALUATION COMPLEXITY: Low   GOALS: Goals reviewed with patient? Yes  SHORT TERM GOALS: Target date: 11/05/22  The patient will be indep with HEP Baseline:modifying prior HEP Goal status: INITIAL  2.  The patient will report pain < or equal to 2/10 Baseline: 3/10, varies up to 8/10 Goal status: INITIAL  4.  The patient will improve R hip abductor strength to 4/5. Baseline: 3/5 Goal status: INITIAL  LONG TERM GOALS: Target date: 12/03/22  The patient will be indep with HEP progression. Baseline: for d/c Goal status: INITIAL  2.  The patient will improve FOTO to 67%. Baseline: 62% Goal status: INITIAL  3.  The patient will improve bilat hip abductor strength to 5/5. Baseline: see MMT chart Goal status: INITIAL  4.  The patient will improve bilat hamstring length to -10 from full extension. Baseline: -22 / -15 Goal status: INITIAL  5.  The patient will tolerate forward trunk flexion without pain. Baseline: end range pain Goal status: INITIAL  6.  The patient will be indep with home walking program and community wellness. Baseline:  Member at [employer] Goal status: INITIAL   PLAN: PT FREQUENCY: 2x/week  PT DURATION:  8 weeks  PLANNED INTERVENTIONS: Therapeutic exercises, Therapeutic activity, Neuromuscular re-education, Balance training, Gait training, Patient/Family education, Self Care, Joint mobilization, Aquatic Therapy, Dry Needling, Electrical stimulation, Spinal mobilization, Moist heat, Taping, and Manual therapy  PLAN FOR NEXT SESSION: Progress LE strengthening, core stability, spinal mobility, and tightness. STM for self mgmt of pain.    Hardin Negus, PTA 10/14/2022, 10:17 AM

## 2022-10-17 ENCOUNTER — Ambulatory Visit: Payer: Managed Care, Other (non HMO) | Admitting: Rehabilitative and Restorative Service Providers"

## 2022-10-20 ENCOUNTER — Ambulatory Visit: Payer: Managed Care, Other (non HMO)

## 2022-10-20 DIAGNOSIS — M25551 Pain in right hip: Secondary | ICD-10-CM | POA: Diagnosis not present

## 2022-10-20 DIAGNOSIS — M6281 Muscle weakness (generalized): Secondary | ICD-10-CM

## 2022-10-20 DIAGNOSIS — M25552 Pain in left hip: Secondary | ICD-10-CM

## 2022-10-20 NOTE — Therapy (Signed)
OUTPATIENT PHYSICAL THERAPY LOWER EXTREMITY TREATMENT   Patient Name: Laura [name] MRN: [id number] DOB:[birth year], 52 y.o., female Today's Date: 10/20/2022   PT End of Session - 10/20/22 1014     Visit Number 3    Number of Visits 16    Date for PT Re-Evaluation 12/07/22    Authorization Type cigna    PT Start Time 1015    PT Stop Time 1057    PT Time Calculation (min) 42 min    Activity Tolerance Patient tolerated treatment well    Behavior During Therapy WFL for tasks assessed/performed             Past Medical History:  Diagnosis Date   Allergic rhinitis    Arthritis    Depression    Dyslipidemia    Dysmenorrhea    Family history of adverse reaction to anesthesia    mother slow to wake up   Gall bladder disease    GERD (gastroesophageal reflux disease)    Headache    History of kidney stones    Infertility, female    Obesity    Pneumonia    Polycystic ovary disease    PONV (postoperative nausea and vomiting)    Sebaceous hyperplasia     on tretinoin   Sleep apnea    cpap   WPW (Wolff-Parkinson-White syndrome)    hx of ablation 2012   Past Surgical History:  Procedure Laterality Date   CESAREAN SECTION     x 2   gastric bypass surgery      INCISIONAL HERNIA REPAIR N/A 07/01/2021   Procedure: INCISIONAL HERNIA REPAIR WITH MESH;  Surgeon: Kinsinger, Arta Bruce, MD;  Location: [facility];  Service: General;  Laterality: N/A;   LAPAROSCOPIC CHOLECYSTECTOMY     left foot surgery      SLEEVE GASTROPLASTY     SUPRAVENTRICULAR TACHYCARDIA ABLATION N/A 07/23/2011   Procedure: SUPRAVENTRICULAR TACHYCARDIA ABLATION;  Surgeon: Evans Lance, MD;  Location: [facility] CATH LAB;  Service: Cardiovascular;  Laterality: N/A;   transthoracic echcardiogram  09/15/2006   Patient Active Problem List   Diagnosis Date Noted   Irregular periods 04/30/2022   Right upper quadrant pain 08/26/2021   Diarrhea 08/26/2021   Nausea 08/26/2021   Incisional hernia 07/01/2021   H/O metal  allergy 05/01/2020   Insomnia 05/01/2020   Iron deficiency 10/26/2019   Hx of gastric bypass 06/29/2019   Migraine 04/28/2019   Situational depression 04/28/2019   Family history of colon cancer 01/19/2019   Hx of adenomatous colonic polyps 01/19/2019   Pain and swelling of right knee 12/20/2018   Intractable right heel pain 12/20/2018   Psychological factors affecting morbid obesity ([place]) 01/27/2018   Protein deficiency ([place]) 12/03/2017   Bilateral hip pain 10/17/2016   Left ureteral stone 04/11/2016   Low back pain 09/05/2015   Uterine fibroid 08/15/2015   Menorrhagia with irregular cycle 08/14/2015   Radiculitis of left cervical region 08/09/2015   Post-traumatic osteoarthritis of left knee 02/15/2015   History of bariatric surgery 02/06/2015   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   GERD (gastroesophageal reflux disease) 06/19/2014   History of continuous positive airway pressure (CPAP) therapy at home 06/19/2014   OSA on CPAP 06/19/2014   Need for immunization against influenza 05/11/2014   Other fatigue 05/11/2014   Snoring 05/11/2014   Mixed hyperlipidemia 04/10/2014   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   Vitamin D deficiency 01/11/2014   Heart palpitations 12/07/2013   Menopausal symptom 05/18/2012   Leg swelling 05/18/2012  History of kidney stones 01/28/2012   Left flank pain 01/28/2012   Microscopic hematuria 01/28/2012   Sebaceous hyperplasia 10/24/2011   BACK PAIN, THORACIC REGION 07/25/2009   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/19/2006   OBESITY, NOS 05/19/2006   Major depressive disorder, recurrent episode ([place]) 05/19/2006   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    PCP: Beatrice Lecher, MD REFERRING PROVIDER: Aundria Mems, MD REFERRING DIAG: [phone] (ICD-10-CM) - Bilateral hip pain  THERAPY DIAG:  Pain in right hip  Pain in left hip  Muscle weakness (generalized)  Rationale for Evaluation and Treatment Rehabilitation  ONSET  DATE: 05/19/22  SUBJECTIVE:   SUBJECTIVE STATEMENT: Patient reports her hips and low back feel sore today. Patient states she is very tired after coming off three consecutive night shifts.    PERTINENT HISTORY: Long standing h/o bilateral hip pain after childbirth, weight loss s/p bariatric surgery 3 years ago, [employer] with cardiac ablation  PAIN:  Are you having pain? Yes: NPRS scale: 3/10 Pain location: bilateral hip pain Pain description: sharp Aggravating factors: goes to an 8/10 when she wakes up at night and when first rising in the morning Relieving factors: pain meds , stretching helps Gets some pain into the sacrum and has pain across her gluts bilaterally  PRECAUTIONS: None  WEIGHT BEARING RESTRICTIONS No  FALLS:  Has patient fallen in last 6 months? 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 Also covering nights at the [place].  PLOF: Independent  PATIENT GOALS Strengthen hips to reduce pain  OBJECTIVE:   PATIENT SURVEYS:  FOTO 62% (goal 67%)  SENSATION: WFL  MUSCLE LENGTH: Hamstrings: Right -15 deg; Left -22 deg Thomas test: Right WNLs ROM, but pain over greater trochanter  POSTURE: WNLs.   PALPATION: Bilateral pain along lateral aspect, IT Band, lateral SI joint Hypomobility noted with PA mobs in thoracic and lumbar spine-- general tightness  LOWER EXTREMITY ROM:    Lumbar A/ROM: Pain at end range flexion (in L5/S1 region) and sensation of "tight" Extension: painful with 50% reduced motion L SB= R side discomfort R SB= no pain WNLs   LOWER EXTREMITY MMT:  MMT Right eval Left eval  Hip flexion 5/5 5/5  Hip extension 4/5 5/5  Hip abduction 3/5 With pain  4-/5  Hip adduction 3+/5 3+/5  Hip internal rotation 5/5 5/5  Hip external rotation 5/5 5/5  Knee flexion 5/5 5/5  Knee extension 5/5 5/5  Ankle dorsiflexion 4+/5 5/5  Ankle plantarflexion    Ankle inversion    Ankle eversion     (Blank rows = not tested)  LOWER EXTREMITY SPECIAL TESTS:  Squat-- at wall, painful bilateral knees Standing heel raises can do bilateral, not unilateral  GAIT: Distance walked: 100 ft Comments: gait characterized by antalgic pattern after sitting    [facility] Adult PT Treatment:                                                DATE: 10/20/2022 Therapeutic Exercise: Counter: L stretch, side lunge stretch Mat Table: Hip add isometric (bal squeeze) 10x5" Hip add (coregeous ball) & abd (gait belt) isometrics  10x5" each S/L straight leg hip abd + IR x10 B S/L hip abd + extension x10 B Bridges + ball squeeze 2x10 Unilateral hip flexion isometric (ball block on knee) Assisted bent knee double leg stretch --> modified on orange SB Seated captain morgan on stool 10x5" Manual Therapy: IASTM distal ITB/lateral thigh   [facility] Adult PT Treatment:                                                DATE: 10/14/2022 Therapeutic Exercise: Supine: Hip add (coregeous ball) & abd (gait belt) isometrics 10x5" each LTR w/ coregeous ball b/w knees x10 Bridges + hip add isometric x10 Prone press up x10 Standing roll down + hip add ball squeeze Cat/cow Quadruped + straight leg raises (hip ext) x10 B Captain morgan standing --> sitting on stool 10x5" B   PATIENT EDUCATION:  Education details: Progressing HEP Person educated: Patient Education method: Explanation, demo, handout Education comprehension: verbalized understanding and returned demonstration  HOME EXERCISE PROGRAM: Access Code: [email or web address] URL: [email or web address] Date: 10/20/2022 Prepared by: Helane Gunther  Exercises - Prone Press Up  - 1 x daily - 7 x weekly - 1 sets - 10 reps - Cat Cow  - 1  x daily - 7 x weekly - 1 sets - 5-10 reps - Primal Push Up  - 1 x daily - 7 x weekly - 1 sets - 5 reps - Standing Heel Raises  - 1 x daily - 7 x weekly - 1 sets - 10-15 reps - Supine Hip Adduction Isometric with Ball  - 1 x daily - 7 x weekly - 3 sets - 10 reps - Bird Dog  - 1 x daily - 7 x weekly - 3 sets - 10 reps - Glute Med Wall Lean  - 1 x daily - 7 x weekly - 3 sets - 10 reps - Supine Knees to Chest with Swiss Ball  - 1 x daily - 7 x weekly - 3 sets - 10 reps  ASSESSMENT:  CLINICAL IMPRESSION:  Assistance provided during modified double leg stretch; cueing improved TA activation and core stability. Increased fascial tightness and trigger points noted along distal lateral quads (R>L).    OBJECTIVE IMPAIRMENTS Abnormal gait, decreased activity tolerance, difficulty walking, decreased strength, hypomobility, increased fascial restrictions, impaired flexibility, and pain.   ACTIVITY LIMITATIONS sleeping, bed mobility, and locomotion level  PARTICIPATION LIMITATIONS: community activity  PERSONAL FACTORS Time since onset of injury/illness/exacerbation are also affecting patient's functional outcome.   REHAB POTENTIAL: Good  CLINICAL DECISION MAKING: Stable/uncomplicated  EVALUATION COMPLEXITY: Low   GOALS: Goals reviewed with patient? Yes  SHORT TERM GOALS: Target date: 11/05/22  The patient will be indep with HEP Baseline:modifying prior HEP Goal status: INITIAL  2.  The patient will report pain < or equal to 2/10 Baseline: 3/10, varies up to 8/10 Goal status: INITIAL  4.  The patient will improve R hip abductor strength to 4/5. Baseline: 3/5 Goal status: INITIAL  LONG TERM GOALS: Target date: 12/03/22  The patient will be indep with HEP progression. Baseline: for d/c Goal status: INITIAL  2.  The patient will improve FOTO to 67%. Baseline: 62% Goal status: INITIAL  3.  The patient will improve bilat hip abductor strength to 5/5. Baseline: see MMT chart Goal  status: INITIAL  4.  The patient will improve bilat hamstring  length to -10 from full extension. Baseline: -22 / -15 Goal status: INITIAL  5.  The patient will tolerate forward trunk flexion without pain. Baseline: end range pain Goal status: INITIAL  6.  The patient will be indep with home walking program and community wellness. Baseline: Member at [employer] Goal status: INITIAL   PLAN: PT FREQUENCY: 2x/week  PT DURATION: 8 weeks  PLANNED INTERVENTIONS: Therapeutic exercises, Therapeutic activity, Neuromuscular re-education, Balance training, Gait training, Patient/Family education, Self Care, Joint mobilization, Aquatic Therapy, Dry Needling, Electrical stimulation, Spinal mobilization, Moist heat, Taping, and Manual therapy  PLAN FOR NEXT SESSION: Progress LE strengthening, core stability, spinal mobility, and tightness. STM for self mgmt of pain.    Hardin Negus, PTA 10/20/2022, 11:00 AM

## 2022-10-21 NOTE — Progress Notes (Signed)
Hi Laura [name], the AST liver enzyme is back down to normal which is great.  The ALT went from 73 down to 34.  So it is also trending down though not quite back to normal.  Iron stores look great.  INR is normal.  No Anemia.  Additional labs still pending.

## 2022-10-22 ENCOUNTER — Encounter: Payer: Self-pay | Admitting: Rehabilitative and Restorative Service Providers"

## 2022-10-22 ENCOUNTER — Ambulatory Visit: Payer: Managed Care, Other (non HMO) | Admitting: Rehabilitative and Restorative Service Providers"

## 2022-10-22 DIAGNOSIS — M25551 Pain in right hip: Secondary | ICD-10-CM

## 2022-10-22 DIAGNOSIS — M25552 Pain in left hip: Secondary | ICD-10-CM

## 2022-10-22 DIAGNOSIS — M6281 Muscle weakness (generalized): Secondary | ICD-10-CM

## 2022-10-22 NOTE — Therapy (Signed)
OUTPATIENT PHYSICAL THERAPY LOWER EXTREMITY TREATMENT   Patient Name: Laura [name] MRN: [id number] DOB:06-Jul-1971, 52 y.o., female Today's Date: 10/22/2022   PT End of Session - 10/22/22 0933     Visit Number 4    Number of Visits 16    Date for PT Re-Evaluation 12/07/22    Authorization Type cigna    PT Start Time [phone]    PT Stop Time [id number]    PT Time Calculation (min) 42 min    Activity Tolerance Patient tolerated treatment well    Behavior During Therapy WFL for tasks assessed/performed             Past Medical History:  Diagnosis Date   Allergic rhinitis    Arthritis    Depression    Dyslipidemia    Dysmenorrhea    Family history of adverse reaction to anesthesia    mother slow to wake up   Gall bladder disease    GERD (gastroesophageal reflux disease)    Headache    History of kidney stones    Infertility, female    Obesity    Pneumonia    Polycystic ovary disease    PONV (postoperative nausea and vomiting)    Sebaceous hyperplasia     on tretinoin   Sleep apnea    cpap   WPW (Wolff-Parkinson-White syndrome)    hx of ablation 2012   Past Surgical History:  Procedure Laterality Date   CESAREAN SECTION     x 2   gastric bypass surgery      INCISIONAL HERNIA REPAIR N/A 07/01/2021   Procedure: INCISIONAL HERNIA REPAIR WITH MESH;  Surgeon: Kinsinger, Arta Bruce, MD;  Location: [facility];  Service: General;  Laterality: N/A;   LAPAROSCOPIC CHOLECYSTECTOMY     left foot surgery      SLEEVE GASTROPLASTY     SUPRAVENTRICULAR TACHYCARDIA ABLATION N/A 07/23/2011   Procedure: SUPRAVENTRICULAR TACHYCARDIA ABLATION;  Surgeon: Evans Lance, MD;  Location: [facility] CATH LAB;  Service: Cardiovascular;  Laterality: N/A;   transthoracic echcardiogram  09/15/2006   Patient Active Problem List   Diagnosis Date Noted   Irregular periods 04/30/2022   Right upper quadrant pain 08/26/2021   Diarrhea 08/26/2021   Nausea 08/26/2021   Incisional hernia 07/01/2021   H/O metal  allergy 05/01/2020   Insomnia 05/01/2020   Iron deficiency 10/26/2019   Hx of gastric bypass 06/29/2019   Migraine 04/28/2019   Situational depression 04/28/2019   Family history of colon cancer 01/19/2019   Hx of adenomatous colonic polyps 01/19/2019   Pain and swelling of right knee 12/20/2018   Intractable right heel pain 12/20/2018   Psychological factors affecting morbid obesity ([place]) 01/27/2018   Protein deficiency ([place]) 12/03/2017   Bilateral hip pain 10/17/2016   Left ureteral stone 04/11/2016   Low back pain 09/05/2015   Uterine fibroid 08/15/2015   Menorrhagia with irregular cycle 08/14/2015   Radiculitis of left cervical region 08/09/2015   Post-traumatic osteoarthritis of left knee 02/15/2015   History of bariatric surgery 02/06/2015   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   GERD (gastroesophageal reflux disease) 06/19/2014   History of continuous positive airway pressure (CPAP) therapy at home 06/19/2014   OSA on CPAP 06/19/2014   Need for immunization against influenza 05/11/2014   Other fatigue 05/11/2014   Snoring 05/11/2014   Mixed hyperlipidemia 04/10/2014   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   Vitamin D deficiency 01/11/2014   Heart palpitations 12/07/2013   Menopausal symptom 05/18/2012   Leg swelling 05/18/2012  History of kidney stones 01/28/2012   Left flank pain 01/28/2012   Microscopic hematuria 01/28/2012   Sebaceous hyperplasia 10/24/2011   BACK PAIN, THORACIC REGION 07/25/2009   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/19/2006   OBESITY, NOS 05/19/2006   Major depressive disorder, recurrent episode ([place]) 05/19/2006   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    PCP: Beatrice Lecher, MD REFERRING PROVIDER: Aundria Mems, MD REFERRING DIAG: [phone] (ICD-10-CM) - Bilateral hip pain  THERAPY DIAG:  Pain in right hip  Pain in left hip  Muscle weakness (generalized)  Rationale for Evaluation and Treatment Rehabilitation  ONSET  DATE: 05/19/22  SUBJECTIVE:   SUBJECTIVE STATEMENT: Patient reports "she worked me hard last time." She notes some soreness after last PT session.   PERTINENT HISTORY: Long standing h/o bilateral hip pain after childbirth, weight loss s/p bariatric surgery 3 years ago, [employer] with cardiac ablation  PAIN:  Are you having pain? Yes: NPRS scale: 4/10 Pain location: bilateral hip pain, pain into low back Pain description: sharp Aggravating factors: goes to an 8/10 when she wakes up at night and when first rising in the morning Relieving factors: pain meds , stretching helps *Had a hard time sleeping the past 2 nights  PRECAUTIONS: None  WEIGHT BEARING RESTRICTIONS No  FALLS:  Has patient fallen in last 6 months? No  PATIENT GOALS Strengthen hips to reduce pain  OBJECTIVE:  (Measures in this section from initial evaluation unless otherwise noted) PATIENT SURVEYS:  FOTO 62% (goal 67%) MUSCLE LENGTH: Hamstrings: Right -15 deg; Left -22 deg Thomas test: Right WNLs ROM, but pain over greater trochanter PALPATION: Bilateral pain along lateral aspect, IT Band, lateral SI joint Hypomobility noted with PA mobs in thoracic and lumbar spine-- general tightness LOWER EXTREMITY ROM:   Lumbar A/ROM: Pain at end range flexion (in L5/S1 region) and sensation of "tight" Extension: painful with 50% reduced motion L SB= R side discomfort R SB= no pain WNLs LOWER EXTREMITY MMT:  MMT Right eval Left eval  Hip flexion 5/5 5/5  Hip extension 4/5 5/5  Hip abduction 3/5 With pain 4-/5  Hip adduction 3+/5 3+/5  Hip internal rotation 5/5 5/5  Hip external rotation 5/5 5/5  Knee flexion 5/5 5/5  Knee extension 5/5 5/5  Ankle dorsiflexion 4+/5 5/5  Ankle plantarflexion    Ankle inversion    Ankle eversion     (Blank rows = not tested)  LOWER EXTREMITY SPECIAL TESTS:  Squat-- at wall, painful bilateral knees Standing heel raises can do bilateral, not  unilateral GAIT: Distance walked: 100 ft Comments: gait characterized by antalgic pattern after sitting   [facility] Adult PT Treatment:                                                DATE: 10/22/22 Therapeutic Exercise: Warm up on treadmill x 4 minutes up to 2.0 mph with bilat UE support Sidelying Hip IR L and R x 5 reps- easy on both sides Clamshell L and R x 12 reps Prone Modified plank on knees Supine Hip adduction x 5 second holds x 10 reps Hip adduction with bridge using ball for isometric squeeze x 10 reps (notes some soreness in coccyx initially) Happy baby stretch Lumbar trunk rotation x 10 reps Double knee to chest with gentle rocking Hip isometric knee flexion x 5 reps with 5 second holds R  and L sides Standing Mini squats x 10 reps Manual Therapy: STM Trigger Point Dry-Needling  Treatment instructions: Expect mild to moderate muscle soreness. Patient verbalized understanding of these instructions and education. Patient Consent Given: Yes Education handout provided: Yes Muscles treated: quad along IT Band (laterally) R and L sides Treatment response/outcome: palpable lengthening  [facility] Adult PT Treatment:                                                DATE: 10/20/2022 Therapeutic Exercise: Counter: L stretch, side lunge stretch Mat Table: Hip add isometric (bal squeeze) 10x5" Hip add (coregeous ball) & abd (gait belt) isometrics 10x5" each S/L straight leg hip abd + IR x10 B S/L hip abd + extension x10 B Bridges + ball squeeze 2x10 Unilateral hip flexion isometric (ball block on knee) Assisted bent knee double leg stretch --> modified on orange SB Seated captain morgan on stool 10x5" Manual Therapy: IASTM distal ITB/lateral thigh  PATIENT EDUCATION:  Education details: Progressing HEP Person educated: Patient Education method: Explanation, demo, handout Education comprehension: verbalized understanding and returned demonstration  HOME EXERCISE PROGRAM: Access  Code: [email or web address] URL: [email or web address] Date: 10/20/2022 Prepared by: Helane Gunther  Exercises - Prone Press Up  - 1 x daily - 7 x weekly - 1 sets - 10 reps - Cat Cow  - 1 x daily - 7 x weekly - 1 sets - 5-10 reps - Primal Push Up  - 1 x daily - 7 x weekly - 1 sets - 5 reps - Standing Heel Raises  - 1 x daily - 7 x weekly - 1 sets - 10-15 reps - Supine Hip Adduction Isometric with Ball  - 1 x daily - 7 x weekly - 3 sets - 10 reps - Bird Dog  - 1 x daily - 7 x weekly - 3 sets - 10 reps - Glute Med Wall Lean  - 1 x daily - 7 x weekly - 3 sets - 10 reps - Supine Knees to Chest with Swiss Ball  - 1 x daily - 7 x weekly - 3 sets - 10 reps  ASSESSMENT:  CLINICAL IMPRESSION:  The patient has increased pain this week with soreness bilateral lateral hips and tightness bilat IT Bands. She tolerated there ex well today and PT added DN and STM for quads.    OBJECTIVE IMPAIRMENTS Abnormal gait, decreased activity tolerance, difficulty walking, decreased strength, hypomobility, increased fascial restrictions, impaired flexibility, and pain.   GOALS: Goals reviewed with patient? Yes  SHORT TERM GOALS: Target date: 11/05/22  The patient will be indep with HEP Baseline:modifying prior HEP Goal status: INITIAL  2.  The patient will report pain < or equal to 2/10 Baseline: 3/10, varies up to 8/10 Goal status: INITIAL  4.  The patient will improve R hip abductor strength to 4/5. Baseline: 3/5 Goal status: INITIAL  LONG TERM GOALS: Target date: 12/03/22  The patient will be indep with HEP progression. Baseline: for d/c Goal status: INITIAL  2.  The patient will improve FOTO to 67%. Baseline: 62% Goal status: INITIAL  3.  The patient will improve bilat hip abductor strength to 5/5. Baseline: see MMT chart Goal status: INITIAL  4.  The patient will improve bilat hamstring length to -10 from full extension. Baseline: -22 / -15 Goal status: INITIAL  5.  The patient will  tolerate forward trunk flexion without pain. Baseline: end range pain Goal status: INITIAL  6.  The patient will be indep with home walking program and community wellness. Baseline: Member at [employer] Goal status: INITIAL   PLAN: PT FREQUENCY: 2x/week  PT DURATION: 8 weeks  PLANNED INTERVENTIONS: Therapeutic exercises, Therapeutic activity, Neuromuscular re-education, Balance training, Gait training, Patient/Family education, Self Care, Joint mobilization, Aquatic Therapy, Dry Needling, Electrical stimulation, Spinal mobilization, Moist heat, Taping, and Manual therapy  PLAN FOR NEXT SESSION: Progress LE strengthening, core stability, spinal mobility, and tightness. STM for self mgmt of pain.    [place], PT 10/22/2022, 9:34 AM

## 2022-10-22 NOTE — Progress Notes (Signed)
Margareta,  Negative for  Celiac disease

## 2022-10-24 ENCOUNTER — Other Ambulatory Visit: Payer: Self-pay | Admitting: *Deleted

## 2022-10-24 DIAGNOSIS — G43809 Other migraine, not intractable, without status migrainosus: Secondary | ICD-10-CM

## 2022-10-24 LAB — IRON,TIBC AND FERRITIN PANEL
%SAT: 21 % (calc) (ref 16–45)
Ferritin: 47 ng/mL (ref 16–232)
Iron: 57 ug/dL (ref 45–160)
TIBC: 276 mcg/dL (calc) (ref 250–450)

## 2022-10-24 LAB — TISSUE TRANSGLUTAMINASE ABS,IGG,IGA
(tTG) Ab, IgA: <1 U/mL
(tTG) Ab, IgG: <1 U/mL

## 2022-10-24 LAB — HEPATIC FUNCTION PANEL
AG Ratio: 2.1 (calc) (ref 1.0–2.5)
ALT: 34 U/L — ABNORMAL HIGH (ref 6–29)
AST: 29 U/L (ref 10–35)
Albumin: 4.1 g/dL (ref 3.6–5.1)
Alkaline phosphatase (APISO): 113 U/L (ref 37–153)
Bilirubin, Direct: 0.1 mg/dL (ref 0.0–0.2)
Globulin: 2 g/dL (calc) (ref 1.9–3.7)
Indirect Bilirubin: 0.4 mg/dL (calc) (ref 0.2–1.2)
Total Bilirubin: 0.5 mg/dL (ref 0.2–1.2)
Total Protein: 6.1 g/dL (ref 6.1–8.1)

## 2022-10-24 LAB — PROTIME-INR
INR: 1
Prothrombin Time: 10.7 s (ref 9.0–11.5)

## 2022-10-24 LAB — CBC
HCT: 42.2 % (ref 35.0–45.0)
Hemoglobin: 14.2 g/dL (ref 11.7–15.5)
MCH: 32.1 pg (ref 27.0–33.0)
MCHC: 33.6 g/dL (ref 32.0–36.0)
MCV: 95.5 fL (ref 80.0–100.0)
MPV: 10 fL (ref 7.5–12.5)
Platelets: 199 10*3/uL (ref 140–400)
RBC: 4.42 10*6/uL (ref 3.80–5.10)
RDW: 11.3 % (ref 11.0–15.0)
WBC: 8 10*3/uL (ref 3.8–10.8)

## 2022-10-24 LAB — IGA: Immunoglobulin A: 321 mg/dL — ABNORMAL HIGH (ref 47–310)

## 2022-10-24 LAB — ANA: Anti Nuclear Antibody (ANA): NEGATIVE

## 2022-10-24 LAB — ANTI-SMOOTH MUSCLE ANTIBODY, IGG: Actin (Smooth Muscle) Antibody (IGG): <20 U (ref <20)

## 2022-10-24 LAB — MITOCHONDRIAL ANTIBODIES: Mitochondrial M2 Ab, IgG: <=20 U (ref <=20.0)

## 2022-10-24 MED ORDER — ONDANSETRON HCL 4 MG PO TABS: 4.0000 mg | ORAL_TABLET | Freq: Three times a day (TID) | ORAL | 0 refills | Status: DC | PRN

## 2022-10-24 NOTE — Progress Notes (Signed)
Hi Laura [name], iron levels look great.  PT and INR normal.  No sign of lupus.  Normal mitochondrial antibodies.  Still awaiting anti-smooth muscle antibody.

## 2022-10-27 ENCOUNTER — Encounter: Payer: Self-pay | Admitting: Rehabilitative and Restorative Service Providers"

## 2022-10-27 ENCOUNTER — Ambulatory Visit: Payer: Managed Care, Other (non HMO) | Admitting: Rehabilitative and Restorative Service Providers"

## 2022-10-27 ENCOUNTER — Ambulatory Visit: Payer: Managed Care, Other (non HMO) | Admitting: Family Medicine

## 2022-10-27 DIAGNOSIS — M25551 Pain in right hip: Secondary | ICD-10-CM

## 2022-10-27 DIAGNOSIS — M25552 Pain in left hip: Secondary | ICD-10-CM

## 2022-10-27 DIAGNOSIS — M6281 Muscle weakness (generalized): Secondary | ICD-10-CM

## 2022-10-27 NOTE — Therapy (Signed)
OUTPATIENT PHYSICAL THERAPY LOWER EXTREMITY TREATMENT   Patient Name: Laura [name] MRN: [id number] DOB:1970-10-25, 52 y.o., female Today's Date: 10/27/2022   PT End of Session - 10/27/22 1152     Visit Number 5    Number of Visits 16    Date for PT Re-Evaluation 12/07/22    Authorization Type cigna    PT Start Time 1152    PT Stop Time 1231    PT Time Calculation (min) 39 min    Activity Tolerance Patient tolerated treatment well    Behavior During Therapy WFL for tasks assessed/performed             Past Medical History:  Diagnosis Date   Allergic rhinitis    Arthritis    Depression    Dyslipidemia    Dysmenorrhea    Family history of adverse reaction to anesthesia    mother slow to wake up   Gall bladder disease    GERD (gastroesophageal reflux disease)    Headache    History of kidney stones    Infertility, female    Obesity    Pneumonia    Polycystic ovary disease    PONV (postoperative nausea and vomiting)    Sebaceous hyperplasia     on tretinoin   Sleep apnea    cpap   WPW (Wolff-Parkinson-White syndrome)    hx of ablation 2012   Past Surgical History:  Procedure Laterality Date   CESAREAN SECTION     x 2   gastric bypass surgery      INCISIONAL HERNIA REPAIR N/A 07/01/2021   Procedure: INCISIONAL HERNIA REPAIR WITH MESH;  Surgeon: Kinsinger, Arta Bruce, MD;  Location: [facility];  Service: General;  Laterality: N/A;   LAPAROSCOPIC CHOLECYSTECTOMY     left foot surgery      SLEEVE GASTROPLASTY     SUPRAVENTRICULAR TACHYCARDIA ABLATION N/A 07/23/2011   Procedure: SUPRAVENTRICULAR TACHYCARDIA ABLATION;  Surgeon: Evans Lance, MD;  Location: [facility] CATH LAB;  Service: Cardiovascular;  Laterality: N/A;   transthoracic echcardiogram  09/15/2006   Patient Active Problem List   Diagnosis Date Noted   Irregular periods 04/30/2022   Right upper quadrant pain 08/26/2021   Diarrhea 08/26/2021   Nausea 08/26/2021   Incisional hernia 07/01/2021   H/O metal  allergy 05/01/2020   Insomnia 05/01/2020   Iron deficiency 10/26/2019   Hx of gastric bypass 06/29/2019   Migraine 04/28/2019   Situational depression 04/28/2019   Family history of colon cancer 01/19/2019   Hx of adenomatous colonic polyps 01/19/2019   Pain and swelling of right knee 12/20/2018   Intractable right heel pain 12/20/2018   Psychological factors affecting morbid obesity ([place]) 01/27/2018   Protein deficiency ([place]) 12/03/2017   Bilateral hip pain 10/17/2016   Left ureteral stone 04/11/2016   Low back pain 09/05/2015   Uterine fibroid 08/15/2015   Menorrhagia with irregular cycle 08/14/2015   Radiculitis of left cervical region 08/09/2015   Post-traumatic osteoarthritis of left knee 02/15/2015   History of bariatric surgery 02/06/2015   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   GERD (gastroesophageal reflux disease) 06/19/2014   History of continuous positive airway pressure (CPAP) therapy at home 06/19/2014   OSA on CPAP 06/19/2014   Need for immunization against influenza 05/11/2014   Other fatigue 05/11/2014   Snoring 05/11/2014   Mixed hyperlipidemia 04/10/2014   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   Vitamin D deficiency 01/11/2014   Heart palpitations 12/07/2013   Menopausal symptom 05/18/2012   Leg swelling 05/18/2012  History of kidney stones 01/28/2012   Left flank pain 01/28/2012   Microscopic hematuria 01/28/2012   Sebaceous hyperplasia 10/24/2011   BACK PAIN, THORACIC REGION 07/25/2009   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/19/2006   OBESITY, NOS 05/19/2006   Major depressive disorder, recurrent episode ([place]) 05/19/2006   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   PCP: Beatrice Lecher, MD REFERRING PROVIDER: Aundria Mems, MD REFERRING DIAG: [phone] (ICD-10-CM) - Bilateral hip pain  THERAPY DIAG:  Pain in right hip  Pain in left hip  Muscle weakness (generalized)  Rationale for Evaluation and Treatment Rehabilitation  ONSET DATE:  05/19/22  SUBJECTIVE:   SUBJECTIVE STATEMENT: Patient reports "she worked me hard last time." She notes some soreness after last PT session.   PERTINENT HISTORY: Long standing h/o bilateral hip pain after childbirth, weight loss s/p bariatric surgery 3 years ago, [employer] with cardiac ablation  PAIN:  Are you having pain? Yes: NPRS scale: 2-3, was a 4-5 over the weekend out of /10 Pain location: bilateral hip pain, pain into low back Pain description: sharp Aggravating factors: goes to an 8/10 when she wakes up at night and when first rising in the morning Relieving factors: pain meds , stretching helps *Had a hard time sleeping the past 2 nights  PRECAUTIONS: None  WEIGHT BEARING RESTRICTIONS No  FALLS:  Has patient fallen in last 6 months? No  PATIENT GOALS Strengthen hips to reduce pain  OBJECTIVE:  (Measures in this section from initial evaluation unless otherwise noted) PATIENT SURVEYS:  FOTO 62% (goal 67%) MUSCLE LENGTH: Hamstrings: Right -15 deg; Left -22 deg Thomas test: Right WNLs ROM, but pain over greater trochanter PALPATION: Bilateral pain along lateral aspect, IT Band, lateral SI joint Hypomobility noted with PA mobs in thoracic and lumbar spine-- general tightness LOWER EXTREMITY ROM:   Lumbar A/ROM: Pain at end range flexion (in L5/S1 region) and sensation of "tight" Extension: painful with 50% reduced motion L SB= R side discomfort R SB= no pain WNLs LOWER EXTREMITY MMT:  MMT Right eval Left eval  Hip flexion 5/5 5/5  Hip extension 4/5 5/5  Hip abduction 3/5 With pain 4-/5  Hip adduction 3+/5 3+/5  Hip internal rotation 5/5 5/5  Hip external rotation 5/5 5/5  Knee flexion 5/5 5/5  Knee extension 5/5 5/5  Ankle dorsiflexion 4+/5 5/5  Ankle plantarflexion    Ankle inversion    Ankle eversion     (Blank rows = not tested)  LOWER EXTREMITY SPECIAL TESTS:  Squat-- at wall, painful bilateral knees Standing heel raises can do  bilateral, not unilateral GAIT: Distance walked: 100 ft Comments: gait characterized by antalgic pattern after sitting   [facility] Adult PT Treatment:                                                DATE: 10/27/22  Therapeutic Exercise: Prone PROM bilateral hips Iliopsoas release on ball R side due to pain felt with piriformis stretch Supine Piriformis stretch provokes anterior hip pain Manual Therapy: STM for lumbar multifidi, glut med, R sacral border, R QL PA mobs grade II-III lumbar spine L2-L5 Trigger Point Dry-Needling  Treatment instructions: Expect mild to moderate muscle soreness. Patient verbalized understanding of these instructions and education. Patient Consent Given: Yes Education handout provided: Yes Muscles treated: lumbar multifid Treatment response/outcome: palpable lengthening, less tissue soreness  [facility] Adult PT  Treatment:                                                DATE: 10/22/22 Therapeutic Exercise: Warm up on treadmill x 4 minutes up to 2.0 mph with bilat UE support Sidelying Hip IR L and R x 5 reps- easy on both sides Clamshell L and R x 12 reps Prone Modified plank on knees Supine Hip adduction x 5 second holds x 10 reps Hip adduction with bridge using ball for isometric squeeze x 10 reps (notes some soreness in coccyx initially) Happy baby stretch Lumbar trunk rotation x 10 reps Double knee to chest with gentle rocking Hip isometric knee flexion x 5 reps with 5 second holds R and L sides Standing Mini squats x 10 reps Manual Therapy: STM Trigger Point Dry-Needling  Treatment instructions: Expect mild to moderate muscle soreness. Patient verbalized understanding of these instructions and education. Patient Consent Given: Yes Education handout provided: Yes Muscles treated: quad along IT Band (laterally) R and L sides Treatment response/outcome: palpable lengthening  PATIENT EDUCATION:  Education details: Progressing HEP Person educated:  Patient Education method: Explanation, demo, handout Education comprehension: verbalized understanding and returned demonstration  HOME EXERCISE PROGRAM: Access Code: [email or web address] URL: [email or web address] Date: 10/20/2022 Prepared by: Helane Gunther  Exercises - Prone Press Up  - 1 x daily - 7 x weekly - 1 sets - 10 reps - Cat Cow  - 1 x daily - 7 x weekly - 1 sets - 5-10 reps - Primal Push Up  - 1 x daily - 7 x weekly - 1 sets - 5 reps - Standing Heel Raises  - 1 x daily - 7 x weekly - 1 sets - 10-15 reps - Supine Hip Adduction Isometric with Ball  - 1 x daily - 7 x weekly - 3 sets - 10 reps - Bird Dog  - 1 x daily - 7 x weekly - 3 sets - 10 reps - Glute Med Wall Lean  - 1 x daily - 7 x weekly - 3 sets - 10 reps - Supine Knees to Chest with Swiss Ball  - 1 x daily - 7 x weekly - 3 sets - 10 reps  ASSESSMENT:  CLINICAL IMPRESSION:  The patient's IT bands are improved after home massage. She has increased sorenss R lumbar region and PT addressed today with STM, joint mobs and dry needling. Plan to continue to progress to patient tolerance.   OBJECTIVE IMPAIRMENTS Abnormal gait, decreased activity tolerance, difficulty walking, decreased strength, hypomobility, increased fascial restrictions, impaired flexibility, and pain.   GOALS: Goals reviewed with patient? Yes  SHORT TERM GOALS: Target date: 11/05/22  The patient will be indep with HEP Baseline:modifying prior HEP Goal status: INITIAL  2.  The patient will report pain < or equal to 2/10 Baseline: 3/10, varies up to 8/10 Goal status: INITIAL  4.  The patient will improve R hip abductor strength to 4/5. Baseline: 3/5 Goal status: INITIAL  LONG TERM GOALS: Target date: 12/03/22  The patient will be indep with HEP progression. Baseline: for d/c Goal status: INITIAL  2.  The patient will improve FOTO to 67%. Baseline: 62% Goal status: INITIAL  3.  The patient will improve bilat hip abductor strength to  5/5. Baseline: see MMT chart Goal status: INITIAL  4.  The patient will improve bilat hamstring  length to -10 from full extension. Baseline: -22 / -15 Goal status: INITIAL  5.  The patient will tolerate forward trunk flexion without pain. Baseline: end range pain Goal status: INITIAL  6.  The patient will be indep with home walking program and community wellness. Baseline: Member at [employer] Goal status: INITIAL   PLAN: PT FREQUENCY: 2x/week  PT DURATION: 8 weeks  PLANNED INTERVENTIONS: Therapeutic exercises, Therapeutic activity, Neuromuscular re-education, Balance training, Gait training, Patient/Family education, Self Care, Joint mobilization, Aquatic Therapy, Dry Needling, Electrical stimulation, Spinal mobilization, Moist heat, Taping, and Manual therapy  PLAN FOR NEXT SESSION: Progress LE strengthening, core stability, spinal mobility, and tightness. STM for self mgmt of pain.    [place], PT 10/27/2022, 12:44 PM

## 2022-10-29 ENCOUNTER — Ambulatory Visit: Payer: Managed Care, Other (non HMO)

## 2022-10-29 DIAGNOSIS — M25552 Pain in left hip: Secondary | ICD-10-CM

## 2022-10-29 DIAGNOSIS — M25551 Pain in right hip: Secondary | ICD-10-CM | POA: Diagnosis not present

## 2022-10-29 DIAGNOSIS — M6281 Muscle weakness (generalized): Secondary | ICD-10-CM

## 2022-10-29 NOTE — Therapy (Signed)
OUTPATIENT PHYSICAL THERAPY LOWER EXTREMITY TREATMENT   Patient Name: Laura [name] MRN: [id number] DOB:[birth year], 52 y.o., female Today's Date: 10/29/2022   PT End of Session - 10/29/22 0802     Visit Number 6    Number of Visits 16    Date for PT Re-Evaluation 12/07/22    Authorization Type cigna    PT Start Time 0802    PT Stop Time 0843    PT Time Calculation (min) 41 min    Activity Tolerance Patient tolerated treatment well    Behavior During Therapy [facility] for tasks assessed/performed             Past Medical History:  Diagnosis Date   Allergic rhinitis    Arthritis    Depression    Dyslipidemia    Dysmenorrhea    Family history of adverse reaction to anesthesia    mother slow to wake up   Gall bladder disease    GERD (gastroesophageal reflux disease)    Headache    History of kidney stones    Infertility, female    Obesity    Pneumonia    Polycystic ovary disease    PONV (postoperative nausea and vomiting)    Sebaceous hyperplasia     on tretinoin   Sleep apnea    cpap   WPW (Wolff-Parkinson-White syndrome)    hx of ablation 2012   Past Surgical History:  Procedure Laterality Date   CESAREAN SECTION     x 2   gastric bypass surgery      INCISIONAL HERNIA REPAIR N/A 07/01/2021   Procedure: INCISIONAL HERNIA REPAIR WITH MESH;  Surgeon: Kinsinger, Arta Bruce, MD;  Location: [facility];  Service: General;  Laterality: N/A;   LAPAROSCOPIC CHOLECYSTECTOMY     left foot surgery      SLEEVE GASTROPLASTY     SUPRAVENTRICULAR TACHYCARDIA ABLATION N/A 07/23/2011   Procedure: SUPRAVENTRICULAR TACHYCARDIA ABLATION;  Surgeon: Evans Lance, MD;  Location: [facility] CATH LAB;  Service: Cardiovascular;  Laterality: N/A;   transthoracic echcardiogram  09/15/2006   Patient Active Problem List   Diagnosis Date Noted   Irregular periods 04/30/2022   Right upper quadrant pain 08/26/2021   Diarrhea 08/26/2021   Nausea 08/26/2021   Incisional hernia 07/01/2021   H/O metal  allergy 05/01/2020   Insomnia 05/01/2020   Iron deficiency 10/26/2019   Hx of gastric bypass 06/29/2019   Migraine 04/28/2019   Situational depression 04/28/2019   Family history of colon cancer 01/19/2019   Hx of adenomatous colonic polyps 01/19/2019   Pain and swelling of right knee 12/20/2018   Intractable right heel pain 12/20/2018   Psychological factors affecting morbid obesity ([place]) 01/27/2018   Protein deficiency ([place]) 12/03/2017   Bilateral hip pain 10/17/2016   Left ureteral stone 04/11/2016   Low back pain 09/05/2015   Uterine fibroid 08/15/2015   Menorrhagia with irregular cycle 08/14/2015   Radiculitis of left cervical region 08/09/2015   Post-traumatic osteoarthritis of left knee 02/15/2015   History of bariatric surgery 02/06/2015   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   GERD (gastroesophageal reflux disease) 06/19/2014   History of continuous positive airway pressure (CPAP) therapy at home 06/19/2014   OSA on CPAP 06/19/2014   Need for immunization against influenza 05/11/2014   Other fatigue 05/11/2014   Snoring 05/11/2014   Mixed hyperlipidemia 04/10/2014   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   Vitamin D deficiency 01/11/2014   Heart palpitations 12/07/2013   Menopausal symptom 05/18/2012   Leg swelling 05/18/2012  History of kidney stones 01/28/2012   Left flank pain 01/28/2012   Microscopic hematuria 01/28/2012   Sebaceous hyperplasia 10/24/2011   BACK PAIN, THORACIC REGION 07/25/2009   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/19/2006   OBESITY, NOS 05/19/2006   Major depressive disorder, recurrent episode ([place]) 05/19/2006   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   PCP: Beatrice Lecher, MD REFERRING PROVIDER: Aundria Mems, MD REFERRING DIAG: [phone] (ICD-10-CM) - Bilateral hip pain  THERAPY DIAG:  Pain in right hip  Pain in left hip  Muscle weakness (generalized)  Rationale for Evaluation and Treatment Rehabilitation  ONSET DATE:  05/19/22  SUBJECTIVE:   SUBJECTIVE STATEMENT: Patient reports she woke up today with pain around her tailbone and pain on right side of sacrum. Patient states she continues to have pain in bilateral hips.   PERTINENT HISTORY: Long standing h/o bilateral hip pain after childbirth, weight loss s/p bariatric surgery 3 years ago, [employer] with cardiac ablation  PAIN:  Are you having pain? Yes: NPRS scale: 2-3, was a 4-5 over the weekend out of /10 Pain location: bilateral hip pain, pain into low back Pain description: sharp Aggravating factors: goes to an 8/10 when she wakes up at night and when first rising in the morning Relieving factors: pain meds , stretching helps *Had a hard time sleeping the past 2 nights  PRECAUTIONS: None  WEIGHT BEARING RESTRICTIONS No  FALLS:  Has patient fallen in last 6 months? No  PATIENT GOALS Strengthen hips to reduce pain  OBJECTIVE:  (Measures in this section from initial evaluation unless otherwise noted) PATIENT SURVEYS:  FOTO 62% (goal 67%)  MUSCLE LENGTH: Hamstrings: Right -15 deg; Left -22 deg Thomas test: Right WNLs ROM, but pain over greater trochanter  PALPATION: Bilateral pain along lateral aspect, IT Band, lateral SI joint Hypomobility noted with PA mobs in thoracic and lumbar spine-- general tightness  LOWER EXTREMITY ROM:   Lumbar A/ROM: Pain at end range flexion (in L5/S1 region) and sensation of "tight" Extension: painful with 50% reduced motion L SB= R side discomfort R SB= no pain WNLs  LOWER EXTREMITY MMT:  MMT Right eval Left eval  Hip flexion 5/5 5/5  Hip extension 4/5 5/5  Hip abduction 3/5 With pain 4-/5  Hip adduction 3+/5 3+/5  Hip internal rotation 5/5 5/5  Hip external rotation 5/5 5/5  Knee flexion 5/5 5/5  Knee extension 5/5 5/5  Ankle dorsiflexion 4+/5 5/5  Ankle plantarflexion    Ankle inversion    Ankle eversion     (Blank rows = not tested)  LOWER EXTREMITY SPECIAL TESTS:   Squat-- at wall, painful bilateral knees Standing heel raises can do bilateral, not unilateral  GAIT: Distance walked: 100 ft Comments: gait characterized by antalgic pattern after sitting    [facility] Adult PT Treatment:                                                DATE: 10/29/2022 Therapeutic Exercise: Standing -->prone myofascial release (small red ball) TFL (B) Prone windshield wiper legs Seated hip IR (ball b/w knees) x10 B (pain on L)  Prone HS curls YTB x10  Prone hip IR/ER (unilateral) Supine, pelvis on foam roller: Marching Bridges Double leg stretch Slow high knee march + opposite knee taps  Slow backwards walking Standing balance on bosu platform    [facility] Adult PT Treatment:  DATE: 10/27/22  Therapeutic Exercise: Prone PROM bilateral hips Iliopsoas release on ball R side due to pain felt with piriformis stretch Supine Piriformis stretch provokes anterior hip pain Manual Therapy: STM for lumbar multifidi, glut med, R sacral border, R QL PA mobs grade II-III lumbar spine L2-L5 Trigger Point Dry-Needling  Treatment instructions: Expect mild to moderate muscle soreness. Patient verbalized understanding of these instructions and education. Patient Consent Given: Yes Education handout provided: Yes Muscles treated: lumbar multifid Treatment response/outcome: palpable lengthening, less tissue soreness  [facility] Adult PT Treatment:                                                DATE: 10/22/22 Therapeutic Exercise: Warm up on treadmill x 4 minutes up to 2.0 mph with bilat UE support Sidelying Hip IR L and R x 5 reps- easy on both sides Clamshell L and R x 12 reps Prone Modified plank on knees Supine Hip adduction x 5 second holds x 10 reps Hip adduction with bridge using ball for isometric squeeze x 10 reps (notes some soreness in coccyx initially) Happy baby stretch Lumbar trunk rotation x 10 reps Double knee to chest with  gentle rocking Hip isometric knee flexion x 5 reps with 5 second holds R and L sides Standing Mini squats x 10 reps Manual Therapy: STM Trigger Point Dry-Needling  Treatment instructions: Expect mild to moderate muscle soreness. Patient verbalized understanding of these instructions and education. Patient Consent Given: Yes Education handout provided: Yes Muscles treated: quad along IT Band (laterally) R and L sides Treatment response/outcome: palpable lengthening  PATIENT EDUCATION:  Education details: Progressing HEP Person educated: Patient Education method: Explanation, demo, handout Education comprehension: verbalized understanding and returned demonstration  HOME EXERCISE PROGRAM: Access Code: [email or web address] URL: [email or web address] Date: 10/29/2022 Prepared by: Helane Gunther  Exercises - Prone Press Up  - 1 x daily - 7 x weekly - 1 sets - 10 reps - Cat Cow  - 1 x daily - 7 x weekly - 1 sets - 5-10 reps - Primal Push Up  - 1 x daily - 7 x weekly - 1 sets - 5 reps - Standing Heel Raises  - 1 x daily - 7 x weekly - 1 sets - 10-15 reps - Supine Hip Adduction Isometric with Ball  - 1 x daily - 7 x weekly - 3 sets - 10 reps - Bird Dog  - 1 x daily - 7 x weekly - 3 sets - 10 reps - Glute Med Wall Lean  - 1 x daily - 7 x weekly - 3 sets - 10 reps - Supine Knees to Chest with Swiss Ball  - 1 x daily - 7 x weekly - 3 sets - 10 reps - Prone Hip Internal Rotation AROM  - 1 x daily - 7 x weekly - 3 sets - 10 reps - Beginner Double Leg Stretch  - 1 x daily - 7 x weekly - 3 sets - 10 reps  ASSESSMENT:  CLINICAL IMPRESSION:  Patient instructed in self-myofascial release technique with soft ball on TFL; patient reported decrease in bilateral anterior hip tightness/tension. Tactile and verbal cues provided to improve ribcage stability and TA activation during bridges and abdominal exercises on foam roller. Hip stability challenged with slow backwards walking and standing  balance on bosu platform.   OBJECTIVE IMPAIRMENTS Abnormal gait,  decreased activity tolerance, difficulty walking, decreased strength, hypomobility, increased fascial restrictions, impaired flexibility, and pain.   GOALS: Goals reviewed with patient? Yes  SHORT TERM GOALS: Target date: 11/05/22  The patient will be indep with HEP Baseline:modifying prior HEP Goal status: INITIAL  2.  The patient will report pain < or equal to 2/10 Baseline: 3/10, varies up to 8/10 Goal status: INITIAL  4.  The patient will improve R hip abductor strength to 4/5. Baseline: 3/5 Goal status: INITIAL  LONG TERM GOALS: Target date: 12/03/22  The patient will be indep with HEP progression. Baseline: for d/c Goal status: INITIAL  2.  The patient will improve FOTO to 67%. Baseline: 62% Goal status: INITIAL  3.  The patient will improve bilat hip abductor strength to 5/5. Baseline: see MMT chart Goal status: INITIAL  4.  The patient will improve bilat hamstring length to -10 from full extension. Baseline: -22 / -15 Goal status: INITIAL  5.  The patient will tolerate forward trunk flexion without pain. Baseline: end range pain Goal status: INITIAL  6.  The patient will be indep with home walking program and community wellness. Baseline: Member at [employer] Goal status: INITIAL   PLAN: PT FREQUENCY: 2x/week  PT DURATION: 8 weeks  PLANNED INTERVENTIONS: Therapeutic exercises, Therapeutic activity, Neuromuscular re-education, Balance training, Gait training, Patient/Family education, Self Care, Joint mobilization, Aquatic Therapy, Dry Needling, Electrical stimulation, Spinal mobilization, Moist heat, Taping, and Manual therapy  PLAN FOR NEXT SESSION: Progress LE strengthening, core stability, spinal mobility, and tightness. STM for self mgmt of pain.    Hardin Negus, PTA 10/29/2022, 8:43 AM

## 2022-11-03 ENCOUNTER — Ambulatory Visit: Payer: Managed Care, Other (non HMO)

## 2022-11-03 DIAGNOSIS — M25551 Pain in right hip: Secondary | ICD-10-CM | POA: Diagnosis not present

## 2022-11-03 DIAGNOSIS — M6281 Muscle weakness (generalized): Secondary | ICD-10-CM

## 2022-11-03 DIAGNOSIS — M25552 Pain in left hip: Secondary | ICD-10-CM

## 2022-11-03 NOTE — Therapy (Signed)
OUTPATIENT PHYSICAL THERAPY LOWER EXTREMITY TREATMENT   Patient Name: Laura [name] MRN: [id number] DOB:1971-06-08, 52 y.o., female Today's Date: 11/03/2022   PT End of Session - 11/03/22 0805     Visit Number 7    Number of Visits 16    Date for PT Re-Evaluation 12/07/22    Authorization Type cigna    PT Start Time 0803    PT Stop Time 0845    PT Time Calculation (min) 42 min    Activity Tolerance Patient tolerated treatment well    Behavior During Therapy WFL for tasks assessed/performed             Past Medical History:  Diagnosis Date   Allergic rhinitis    Arthritis    Depression    Dyslipidemia    Dysmenorrhea    Family history of adverse reaction to anesthesia    mother slow to wake up   Gall bladder disease    GERD (gastroesophageal reflux disease)    Headache    History of kidney stones    Infertility, female    Obesity    Pneumonia    Polycystic ovary disease    PONV (postoperative nausea and vomiting)    Sebaceous hyperplasia     on tretinoin   Sleep apnea    cpap   WPW (Wolff-Parkinson-White syndrome)    hx of ablation 2012   Past Surgical History:  Procedure Laterality Date   CESAREAN SECTION     x 2   gastric bypass surgery      INCISIONAL HERNIA REPAIR N/A 07/01/2021   Procedure: INCISIONAL HERNIA REPAIR WITH MESH;  Surgeon: Kinsinger, Arta Bruce, MD;  Location: [facility];  Service: General;  Laterality: N/A;   LAPAROSCOPIC CHOLECYSTECTOMY     left foot surgery      SLEEVE GASTROPLASTY     SUPRAVENTRICULAR TACHYCARDIA ABLATION N/A 07/23/2011   Procedure: SUPRAVENTRICULAR TACHYCARDIA ABLATION;  Surgeon: Evans Lance, MD;  Location: [facility] CATH LAB;  Service: Cardiovascular;  Laterality: N/A;   transthoracic echcardiogram  09/15/2006   Patient Active Problem List   Diagnosis Date Noted   Irregular periods 04/30/2022   Right upper quadrant pain 08/26/2021   Diarrhea 08/26/2021   Nausea 08/26/2021   Incisional hernia 07/01/2021   H/O metal  allergy 05/01/2020   Insomnia 05/01/2020   Iron deficiency 10/26/2019   Hx of gastric bypass 06/29/2019   Migraine 04/28/2019   Situational depression 04/28/2019   Family history of colon cancer 01/19/2019   Hx of adenomatous colonic polyps 01/19/2019   Pain and swelling of right knee 12/20/2018   Intractable right heel pain 12/20/2018   Psychological factors affecting morbid obesity ([place]) 01/27/2018   Protein deficiency ([place]) 12/03/2017   Bilateral hip pain 10/17/2016   Left ureteral stone 04/11/2016   Low back pain 09/05/2015   Uterine fibroid 08/15/2015   Menorrhagia with irregular cycle 08/14/2015   Radiculitis of left cervical region 08/09/2015   Post-traumatic osteoarthritis of left knee 02/15/2015   History of bariatric surgery 02/06/2015   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   GERD (gastroesophageal reflux disease) 06/19/2014   History of continuous positive airway pressure (CPAP) therapy at home 06/19/2014   OSA on CPAP 06/19/2014   Need for immunization against influenza 05/11/2014   Other fatigue 05/11/2014   Snoring 05/11/2014   Mixed hyperlipidemia 04/10/2014   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   Vitamin D deficiency 01/11/2014   Heart palpitations 12/07/2013   Menopausal symptom 05/18/2012   Leg swelling 05/18/2012  History of kidney stones 01/28/2012   Left flank pain 01/28/2012   Microscopic hematuria 01/28/2012   Sebaceous hyperplasia 10/24/2011   BACK PAIN, THORACIC REGION 07/25/2009   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/19/2006   OBESITY, NOS 05/19/2006   Major depressive disorder, recurrent episode ([place]) 05/19/2006   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   PCP: Beatrice Lecher, MD REFERRING PROVIDER: Aundria Mems, MD REFERRING DIAG: [phone] (ICD-10-CM) - Bilateral hip pain  THERAPY DIAG:  Pain in right hip  Pain in left hip  Muscle weakness (generalized)  Rationale for Evaluation and Treatment Rehabilitation  ONSET DATE:  05/19/22  SUBJECTIVE:   SUBJECTIVE STATEMENT: Patient reports she has been in a lot of pain over the weekend, states all her joints are bothering her today and reports 6/10 pain "all over".   PERTINENT HISTORY: Long standing h/o bilateral hip pain after childbirth, weight loss s/p bariatric surgery 3 years ago, [employer] with cardiac ablation  PAIN:  Are you having pain? Yes: NPRS scale: 2-3, was a 4-5 over the weekend out of /10 Pain location: bilateral hip pain, pain into low back Pain description: sharp Aggravating factors: goes to an 8/10 when she wakes up at night and when first rising in the morning Relieving factors: pain meds , stretching helps *Had a hard time sleeping the past 2 nights  PRECAUTIONS: None  WEIGHT BEARING RESTRICTIONS No  FALLS:  Has patient fallen in last 6 months? No  PATIENT GOALS Strengthen hips to reduce pain  OBJECTIVE:  (Measures in this section from initial evaluation unless otherwise noted) PATIENT SURVEYS:  FOTO 62% (goal 67%)  MUSCLE LENGTH: Hamstrings: Right -15 deg; Left -22 deg Thomas test: Right WNLs ROM, but pain over greater trochanter  PALPATION: Bilateral pain along lateral aspect, IT Band, lateral SI joint Hypomobility noted with PA mobs in thoracic and lumbar spine-- general tightness  LOWER EXTREMITY ROM:   Lumbar A/ROM: Pain at end range flexion (in L5/S1 region) and sensation of "tight" Extension: painful with 50% reduced motion L SB= R side discomfort R SB= no pain WNLs  LOWER EXTREMITY MMT:  MMT Right eval Left eval  Hip flexion 5/5 5/5  Hip extension 4/5 5/5  Hip abduction 3/5 With pain 4-/5  Hip adduction 3+/5 3+/5  Hip internal rotation 5/5 5/5  Hip external rotation 5/5 5/5  Knee flexion 5/5 5/5  Knee extension 5/5 5/5  Ankle dorsiflexion 4+/5 5/5  Ankle plantarflexion    Ankle inversion    Ankle eversion     (Blank rows = not tested)  LOWER EXTREMITY SPECIAL TESTS:  Squat-- at wall,  painful bilateral knees Standing heel raises can do bilateral, not unilateral  GAIT: Distance walked: 100 ft Comments: gait characterized by antalgic pattern after sitting    [facility] Adult PT Treatment:                                                DATE: 11/03/2022 Therapeutic Exercise: Hip isometrics: add (ball squeeze) & abd (gait belt) Bridges with gait belt (hip abd isometrics) Bent knee fall out blue loop band --> clamshells Diaphragmatic breathing + TA activation Manual Therapy: STM for thoracolumbar paraspinals (focus on L) Modalities: Moist heat low/mid back     [facility] Adult PT Treatment:  DATE: 10/29/2022 Therapeutic Exercise: Standing -->prone myofascial release (small red ball) TFL (B) Prone windshield wiper legs Seated hip IR (ball b/w knees) x10 B (pain on L)  Prone HS curls YTB x10  Prone hip IR/ER (unilateral) Supine, pelvis on foam roller: Marching Bridges Double leg stretch Slow high knee march + opposite knee taps  Slow backwards walking Standing balance on bosu platform    [facility] Adult PT Treatment:                                                DATE: 10/27/22  Therapeutic Exercise: Prone PROM bilateral hips Iliopsoas release on ball R side due to pain felt with piriformis stretch Supine Piriformis stretch provokes anterior hip pain Manual Therapy: STM for lumbar multifidi, glut med, R sacral border, R QL PA mobs grade II-III lumbar spine L2-L5 Trigger Point Dry-Needling  Treatment instructions: Expect mild to moderate muscle soreness. Patient verbalized understanding of these instructions and education. Patient Consent Given: Yes Education handout provided: Yes Muscles treated: lumbar multifid Treatment response/outcome: palpable lengthening, less tissue soreness    PATIENT EDUCATION:  Education details: Progressing HEP Person educated: Patient Education method: Explanation, demo, handout Education  comprehension: verbalized understanding and returned demonstration  HOME EXERCISE PROGRAM: Access Code: [email or web address] URL: [email or web address] Date: 10/29/2022 Prepared by: Helane Gunther  Exercises - Prone Press Up  - 1 x daily - 7 x weekly - 1 sets - 10 reps - Cat Cow  - 1 x daily - 7 x weekly - 1 sets - 5-10 reps - Primal Push Up  - 1 x daily - 7 x weekly - 1 sets - 5 reps - Standing Heel Raises  - 1 x daily - 7 x weekly - 1 sets - 10-15 reps - Supine Hip Adduction Isometric with Ball  - 1 x daily - 7 x weekly - 3 sets - 10 reps - Bird Dog  - 1 x daily - 7 x weekly - 3 sets - 10 reps - Glute Med Wall Lean  - 1 x daily - 7 x weekly - 3 sets - 10 reps - Supine Knees to Chest with Swiss Ball  - 1 x daily - 7 x weekly - 3 sets - 10 reps - Prone Hip Internal Rotation AROM  - 1 x daily - 7 x weekly - 3 sets - 10 reps - Beginner Double Leg Stretch  - 1 x daily - 7 x weekly - 3 sets - 10 reps  ASSESSMENT:  CLINICAL IMPRESSION: Left-side low back spasms present during unilateral clamshells in supine (R LE moving, L LE stabilizing); symptoms alleviated with bilateral hip abd clamshells. Patient unable to tolerate much movement and exercises today due to significant increase in pain in bilateral hips and "all over" as reported by patient.    OBJECTIVE IMPAIRMENTS Abnormal gait, decreased activity tolerance, difficulty walking, decreased strength, hypomobility, increased fascial restrictions, impaired flexibility, and pain.   GOALS: Goals reviewed with patient? Yes  SHORT TERM GOALS: Target date: 11/05/22  The patient will be indep with HEP Baseline:modifying prior HEP Goal status: MET  2.  The patient will report pain < or equal to 2/10 Baseline: 3/10, varies up to 8/10 Goal status: IN PROGRESS  4.  The patient will improve R hip abductor strength to 4/5. Baseline: 3/5 Goal status: INITIAL  LONG TERM GOALS: Target  date: 12/03/22  The patient will be indep with HEP  progression. Baseline: for d/c Goal status: INITIAL  2.  The patient will improve FOTO to 67%. Baseline: 62% Goal status: IN PROGRESS (50% on 11/03/22)  3.  The patient will improve bilat hip abductor strength to 5/5. Baseline: see MMT chart Goal status: INITIAL  4.  The patient will improve bilat hamstring length to -10 from full extension. Baseline: -22 / -15 Goal status: INITIAL  5.  The patient will tolerate forward trunk flexion without pain. Baseline: end range pain Goal status: INITIAL  6.  The patient will be indep with home walking program and community wellness. Baseline: Member at [employer] Goal status: INITIAL   PLAN: PT FREQUENCY: 2x/week  PT DURATION: 8 weeks  PLANNED INTERVENTIONS: Therapeutic exercises, Therapeutic activity, Neuromuscular re-education, Balance training, Gait training, Patient/Family education, Self Care, Joint mobilization, Aquatic Therapy, Dry Needling, Electrical stimulation, Spinal mobilization, Moist heat, Taping, and Manual therapy  PLAN FOR NEXT SESSION: DN next visit. Progress LE strengthening, core stability, spinal mobility, and tightness. STM for self mgmt of pain.    Hardin Negus, PTA 11/03/2022, 8:46 AM

## 2022-11-05 ENCOUNTER — Ambulatory Visit: Payer: Managed Care, Other (non HMO)

## 2022-11-05 DIAGNOSIS — M6281 Muscle weakness (generalized): Secondary | ICD-10-CM

## 2022-11-05 DIAGNOSIS — M25551 Pain in right hip: Secondary | ICD-10-CM

## 2022-11-05 DIAGNOSIS — M25552 Pain in left hip: Secondary | ICD-10-CM

## 2022-11-05 NOTE — Therapy (Signed)
OUTPATIENT PHYSICAL THERAPY LOWER EXTREMITY TREATMENT   Patient Name: Laura [name] MRN: [id number] DOB:June 10, 1971, 52 y.o., female Today's Date: 11/05/2022   PT End of Session - 11/05/22 0852     Visit Number 8    Number of Visits 16    Date for PT Re-Evaluation 12/07/22    Authorization Type cigna    PT Start Time [phone]    PT Stop Time 0930    PT Time Calculation (min) 40 min    Activity Tolerance Patient tolerated treatment well    Behavior During Therapy WFL for tasks assessed/performed             Past Medical History:  Diagnosis Date   Allergic rhinitis    Arthritis    Depression    Dyslipidemia    Dysmenorrhea    Family history of adverse reaction to anesthesia    mother slow to wake up   Gall bladder disease    GERD (gastroesophageal reflux disease)    Headache    History of kidney stones    Infertility, female    Obesity    Pneumonia    Polycystic ovary disease    PONV (postoperative nausea and vomiting)    Sebaceous hyperplasia     on tretinoin   Sleep apnea    cpap   WPW (Wolff-Parkinson-White syndrome)    hx of ablation 2012   Past Surgical History:  Procedure Laterality Date   CESAREAN SECTION     x 2   gastric bypass surgery      INCISIONAL HERNIA REPAIR N/A 07/01/2021   Procedure: INCISIONAL HERNIA REPAIR WITH MESH;  Surgeon: Kinsinger, Arta Bruce, MD;  Location: [facility];  Service: General;  Laterality: N/A;   LAPAROSCOPIC CHOLECYSTECTOMY     left foot surgery      SLEEVE GASTROPLASTY     SUPRAVENTRICULAR TACHYCARDIA ABLATION N/A 07/23/2011   Procedure: SUPRAVENTRICULAR TACHYCARDIA ABLATION;  Surgeon: Evans Lance, MD;  Location: [facility] CATH LAB;  Service: Cardiovascular;  Laterality: N/A;   transthoracic echcardiogram  09/15/2006   Patient Active Problem List   Diagnosis Date Noted   Irregular periods 04/30/2022   Right upper quadrant pain 08/26/2021   Diarrhea 08/26/2021   Nausea 08/26/2021   Incisional hernia 07/01/2021   H/O metal  allergy 05/01/2020   Insomnia 05/01/2020   Iron deficiency 10/26/2019   Hx of gastric bypass 06/29/2019   Migraine 04/28/2019   Situational depression 04/28/2019   Family history of colon cancer 01/19/2019   Hx of adenomatous colonic polyps 01/19/2019   Pain and swelling of right knee 12/20/2018   Intractable right heel pain 12/20/2018   Psychological factors affecting morbid obesity ([place]) 01/27/2018   Protein deficiency ([place]) 12/03/2017   Bilateral hip pain 10/17/2016   Left ureteral stone 04/11/2016   Low back pain 09/05/2015   Uterine fibroid 08/15/2015   Menorrhagia with irregular cycle 08/14/2015   Radiculitis of left cervical region 08/09/2015   Post-traumatic osteoarthritis of left knee 02/15/2015   History of bariatric surgery 02/06/2015   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   GERD (gastroesophageal reflux disease) 06/19/2014   History of continuous positive airway pressure (CPAP) therapy at home 06/19/2014   OSA on CPAP 06/19/2014   Need for immunization against influenza 05/11/2014   Other fatigue 05/11/2014   Snoring 05/11/2014   Mixed hyperlipidemia 04/10/2014   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   Vitamin D deficiency 01/11/2014   Heart palpitations 12/07/2013   Menopausal symptom 05/18/2012   Leg swelling 05/18/2012  History of kidney stones 01/28/2012   Left flank pain 01/28/2012   Microscopic hematuria 01/28/2012   Sebaceous hyperplasia 10/24/2011   BACK PAIN, THORACIC REGION 07/25/2009   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/19/2006   OBESITY, NOS 05/19/2006   Major depressive disorder, recurrent episode ([place]) 05/19/2006   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   PCP: Beatrice Lecher, MD REFERRING PROVIDER: Aundria Mems, MD REFERRING DIAG: [phone] (ICD-10-CM) - Bilateral hip pain  THERAPY DIAG:  Pain in right hip  Pain in left hip  Muscle weakness (generalized)  Rationale for Evaluation and Treatment Rehabilitation  ONSET DATE:  05/19/22  SUBJECTIVE:   SUBJECTIVE STATEMENT: Patient reports she is feeling better but still 4-5/10 "all over". Patient states she feels "pain in my bones", states she has that feeling in bilateral thighs, right shoulder/upper arm, R index finger and L ankle. Patient states she has appointment with Dr. Madilyn Fireman on Thursday 11/06/22 to follow up on theses symptoms.   PERTINENT HISTORY: Long standing h/o bilateral hip pain after childbirth, weight loss s/p bariatric surgery 3 years ago, [employer] with cardiac ablation  PAIN:  Are you having pain? Yes: NPRS scale: 2-3, was a 4-5 over the weekend out of /10 Pain location: bilateral hip pain, pain into low back Pain description: sharp Aggravating factors: goes to an 8/10 when she wakes up at night and when first rising in the morning Relieving factors: pain meds , stretching helps *Had a hard time sleeping the past 2 nights  PRECAUTIONS: None  WEIGHT BEARING RESTRICTIONS No  FALLS:  Has patient fallen in last 6 months? No  PATIENT GOALS Strengthen hips to reduce pain  OBJECTIVE:  (Measures in this section from initial evaluation unless otherwise noted) PATIENT SURVEYS:  FOTO 62% (goal 67%)  MUSCLE LENGTH: Hamstrings: Right -15 deg; Left -22 deg Thomas test: Right WNLs ROM, but pain over greater trochanter  PALPATION: Bilateral pain along lateral aspect, IT Band, lateral SI joint Hypomobility noted with PA mobs in thoracic and lumbar spine-- general tightness  LOWER EXTREMITY ROM:   Lumbar A/ROM: Pain at end range flexion (in L5/S1 region) and sensation of "tight" Extension: painful with 50% reduced motion L SB= R side discomfort R SB= no pain WNLs  LOWER EXTREMITY MMT:  MMT Right eval Left eval  Hip flexion 5/5 5/5  Hip extension 4/5 5/5  Hip abduction 3/5 With pain 4-/5  Hip adduction 3+/5 3+/5  Hip internal rotation 5/5 5/5  Hip external rotation 5/5 5/5  Knee flexion 5/5 5/5  Knee extension 5/5 5/5   Ankle dorsiflexion 4+/5 5/5  Ankle plantarflexion    Ankle inversion    Ankle eversion     (Blank rows = not tested)  LOWER EXTREMITY SPECIAL TESTS:  Squat-- at wall, painful bilateral knees Standing heel raises can do bilateral, not unilateral  GAIT: Distance walked: 100 ft Comments: gait characterized by antalgic pattern after sitting    [facility] Adult PT Treatment:                                                DATE: 11/05/2022 Therapeutic Exercise: Supine with moist heat on low and mid back: LTR x10 Hip add isometric ball squeeze x10 Clamshells red loop band x10 Bridges with red loop band x10 Modified double leg stretch (ball b/w knees) x10 Prone hip IR & ER AROM Bear plank holds  5x5" Cat/cow Long sitting SLR side to side over yoga egg 2x5 B Side stepping (no resistance) Supine ITB stretch w/strap 3x30" B SKTC stretch (B)    [facility] Adult PT Treatment:                                                DATE: 11/03/2022 Therapeutic Exercise: Hip isometrics: add (ball squeeze) & abd (gait belt) Bridges with gait belt (hip abd isometrics) Bent knee fall out blue loop band --> clamshells Diaphragmatic breathing + TA activation Manual Therapy: STM for thoracolumbar paraspinals (focus on L) Modalities: Moist heat low/mid back     [facility] Adult PT Treatment:                                                DATE: 10/27/22  Therapeutic Exercise: Prone PROM bilateral hips Iliopsoas release on ball R side due to pain felt with piriformis stretch Supine Piriformis stretch provokes anterior hip pain Manual Therapy: STM for lumbar multifidi, glut med, R sacral border, R QL PA mobs grade II-III lumbar spine L2-L5 Trigger Point Dry-Needling  Treatment instructions: Expect mild to moderate muscle soreness. Patient verbalized understanding of these instructions and education. Patient Consent Given: Yes Education handout provided: Yes Muscles treated: lumbar multifid Treatment  response/outcome: palpable lengthening, less tissue soreness    PATIENT EDUCATION:  Education details: Progressing HEP Person educated: Patient Education method: Explanation, demo, handout Education comprehension: verbalized understanding and returned demonstration  HOME EXERCISE PROGRAM: Access Code: [email or web address] URL: [email or web address] Date: 10/29/2022 Prepared by: Helane Gunther  Exercises - Prone Press Up  - 1 x daily - 7 x weekly - 1 sets - 10 reps - Cat Cow  - 1 x daily - 7 x weekly - 1 sets - 5-10 reps - Primal Push Up  - 1 x daily - 7 x weekly - 1 sets - 5 reps - Standing Heel Raises  - 1 x daily - 7 x weekly - 1 sets - 10-15 reps - Supine Hip Adduction Isometric with Ball  - 1 x daily - 7 x weekly - 3 sets - 10 reps - Bird Dog  - 1 x daily - 7 x weekly - 3 sets - 10 reps - Glute Med Wall Lean  - 1 x daily - 7 x weekly - 3 sets - 10 reps - Supine Knees to Chest with Swiss Ball  - 1 x daily - 7 x weekly - 3 sets - 10 reps - Prone Hip Internal Rotation AROM  - 1 x daily - 7 x weekly - 3 sets - 10 reps - Beginner Double Leg Stretch  - 1 x daily - 7 x weekly - 3 sets - 10 reps  ASSESSMENT:  CLINICAL IMPRESSION: Patient able to tolerate more physical activity and exercises today with no exacerbation of symptoms. Core strengthening with focus on TA activation progressed with plank hold variation. Patient reported increase in pain in R hip/LE with weight bearing during side stepping.   OBJECTIVE IMPAIRMENTS Abnormal gait, decreased activity tolerance, difficulty walking, decreased strength, hypomobility, increased fascial restrictions, impaired flexibility, and pain.   GOALS: Goals reviewed with patient? Yes  SHORT TERM GOALS: Target date: 11/05/22  The patient will be  indep with HEP Baseline:modifying prior HEP Goal status: MET  2.  The patient will report pain < or equal to 2/10 Baseline: 3/10, varies up to 8/10 Goal status: IN PROGRESS  4.  The patient  will improve R hip abductor strength to 4/5. Baseline: 3/5 Goal status: INITIAL  LONG TERM GOALS: Target date: 12/03/22  The patient will be indep with HEP progression. Baseline: for d/c Goal status: INITIAL  2.  The patient will improve FOTO to 67%. Baseline: 62% Goal status: IN PROGRESS (50% on 11/03/22)  3.  The patient will improve bilat hip abductor strength to 5/5. Baseline: see MMT chart Goal status: INITIAL  4.  The patient will improve bilat hamstring length to -10 from full extension. Baseline: -22 / -15 Goal status: INITIAL  5.  The patient will tolerate forward trunk flexion without pain. Baseline: end range pain Goal status: INITIAL  6.  The patient will be indep with home walking program and community wellness. Baseline: Member at [employer] Goal status: INITIAL   PLAN: PT FREQUENCY: 2x/week  PT DURATION: 8 weeks  PLANNED INTERVENTIONS: Therapeutic exercises, Therapeutic activity, Neuromuscular re-education, Balance training, Gait training, Patient/Family education, Self Care, Joint mobilization, Aquatic Therapy, Dry Needling, Electrical stimulation, Spinal mobilization, Moist heat, Taping, and Manual therapy  PLAN FOR NEXT SESSION: DN next visit with PT. Progress LE strengthening, core stability, spinal mobility, and tightness. STM for self mgmt of pain.    Hardin Negus, PTA 11/05/2022, 9:30 AM

## 2022-11-06 ENCOUNTER — Encounter: Payer: Self-pay | Admitting: Family Medicine

## 2022-11-06 ENCOUNTER — Ambulatory Visit (INDEPENDENT_AMBULATORY_CARE_PROVIDER_SITE_OTHER): Payer: Managed Care, Other (non HMO) | Admitting: Family Medicine

## 2022-11-06 VITALS — BP 101/51 | HR 76 | Ht 64.0 in | Wt 204.0 lb

## 2022-11-06 DIAGNOSIS — M25551 Pain in right hip: Secondary | ICD-10-CM

## 2022-11-06 DIAGNOSIS — M25552 Pain in left hip: Secondary | ICD-10-CM

## 2022-11-06 DIAGNOSIS — M7918 Myalgia, other site: Secondary | ICD-10-CM | POA: Diagnosis not present

## 2022-11-06 MED ORDER — DULOXETINE HCL 30 MG PO CPEP: 30.0000 mg | ORAL_CAPSULE | Freq: Every day | ORAL | 1 refills | Status: DC

## 2022-11-06 MED ORDER — TRAMADOL HCL 50 MG PO TABS: 50.0000 mg | ORAL_TABLET | Freq: Three times a day (TID) | ORAL | 0 refills | Status: DC | PRN

## 2022-11-06 NOTE — Progress Notes (Signed)
Established Patient Office Visit  Subjective   Patient ID: Laura [name], female    DOB: [birth year]  Age: 52 y.o. MRN: [id number]  Chief Complaint  Patient presents with   joint pain    HPI Here today to discuss joint pain. Hx of back, neck and knee arthritis.  Currently engaged in therapy  for bilateral hip pain.  She is actually been going for several weeks.  She now has more shooting pain going down her right hip into her right thigh.  She is also noticed that the PIP joint in her index and on the right hand is swollen intermittently as well.  She does have a family history of rheumatoid in her great aunt in [place] and a cousin.  No family history of gout.  She says she is constantly going and it is difficult if her pain is elevated.  She will still have episodes of weakness going down her right arm ever since she had the COVID-vaccine she did have nerve conduction studies which were unrevealing for potential cause.  And then did see rheumatology in fact she last saw them in September and they felt like most of her symptoms are related to osteoarthritis.      ROS    Objective:     BP (!) 101/51   Pulse 76   Ht 5\' 4"  (1.626 m)   Wt 204 lb (92.5 kg)   LMP 02/12/2019   SpO2 98%   BMI 35.02 kg/m     Physical Exam Vitals reviewed.  Constitutional:      Appearance: She is well-developed.  HENT:     Head: Normocephalic and atraumatic.  Eyes:     Conjunctiva/sclera: Conjunctivae normal.  Cardiovascular:     Rate and Rhythm: Normal rate.  Pulmonary:     Effort: Pulmonary effort is normal.  Skin:    General: Skin is dry.     Coloration: Skin is not pale.  Neurological:     Mental Status: She is alert and oriented to person, place, and time.  Psychiatric:        Behavior: Behavior normal.      No results found for any visits on 11/06/22.     The 10-year ASCVD risk score (Arnett DK, et al., 2019) is: 0.8%    Assessment & Plan:   Problem List Items Addressed This  Visit       Other   Bilateral hip pain   Relevant Medications   traMADol (ULTRAM) 50 MG tablet   Other Visit Diagnoses     Musculoskeletal pain    -  Primary   Relevant Medications   DULoxetine (CYMBALTA) 30 MG capsule      SHe does have more chronic musculoskeletal pain affecting multiple joints.  She also has known osteoarthritis.  But has had some intermittent weakness ever since the COVID-vaccine.  Did discuss the possibility of Cymbalta for chronic musculoskeletal pain and it is FDA indicated.  It is also now generic.  Will start with 30 mg daily.  Will follow-up in 1 month.  If she is doing well then we can plan to be even potentially taper the Cipro, as Cymbalta also has a mood benefit.  For now we will keep both for at least this next month before adjusting the Lexapro.  Would also like a refill on the tramadol.  Return in about 6 weeks (around 12/18/2022) for New start medication.   I spent 25 minutes on the day of the encounter to  include pre-visit record review, face-to-face time with the patient and post visit ordering of test.   Beatrice Lecher, MD

## 2022-11-06 NOTE — Patient Instructions (Signed)
Plan will be to wean the Lexapro if you do well on Cymbalta.

## 2022-11-11 ENCOUNTER — Ambulatory Visit: Payer: Managed Care, Other (non HMO) | Attending: Sports Medicine

## 2022-11-11 DIAGNOSIS — M25551 Pain in right hip: Secondary | ICD-10-CM | POA: Diagnosis present

## 2022-11-11 DIAGNOSIS — M6281 Muscle weakness (generalized): Secondary | ICD-10-CM | POA: Diagnosis present

## 2022-11-11 DIAGNOSIS — M25552 Pain in left hip: Secondary | ICD-10-CM | POA: Diagnosis present

## 2022-11-11 NOTE — Therapy (Signed)
OUTPATIENT PHYSICAL THERAPY LOWER EXTREMITY TREATMENT   Patient Name: Laura [name] MRN: [id number] DOB:Jun 07, 1971, 52 y.o., female Today's Date: 11/11/2022   PT End of Session - 11/11/22 0903     Visit Number 9    Number of Visits 16    Date for PT Re-Evaluation 12/07/22    Authorization Type cigna    PT Start Time 0902   patient arrived late   PT Stop Time 0930    PT Time Calculation (min) 28 min    Activity Tolerance Patient tolerated treatment well    Behavior During Therapy WFL for tasks assessed/performed             Past Medical History:  Diagnosis Date   Allergic rhinitis    Arthritis    Depression    Dyslipidemia    Dysmenorrhea    Family history of adverse reaction to anesthesia    mother slow to wake up   Gall bladder disease    GERD (gastroesophageal reflux disease)    Headache    History of kidney stones    Infertility, female    Obesity    Pneumonia    Polycystic ovary disease    PONV (postoperative nausea and vomiting)    Sebaceous hyperplasia     on tretinoin   Sleep apnea    cpap   WPW (Wolff-Parkinson-White syndrome)    hx of ablation 2012   Past Surgical History:  Procedure Laterality Date   CESAREAN SECTION     x 2   gastric bypass surgery      INCISIONAL HERNIA REPAIR N/A 07/01/2021   Procedure: INCISIONAL HERNIA REPAIR WITH MESH;  Surgeon: Kinsinger, Arta Bruce, MD;  Location: [facility];  Service: General;  Laterality: N/A;   LAPAROSCOPIC CHOLECYSTECTOMY     left foot surgery      SLEEVE GASTROPLASTY     SUPRAVENTRICULAR TACHYCARDIA ABLATION N/A 07/23/2011   Procedure: SUPRAVENTRICULAR TACHYCARDIA ABLATION;  Surgeon: Evans Lance, MD;  Location: [facility] CATH LAB;  Service: Cardiovascular;  Laterality: N/A;   transthoracic echcardiogram  09/15/2006   Patient Active Problem List   Diagnosis Date Noted   Irregular periods 04/30/2022   Right upper quadrant pain 08/26/2021   Nausea 08/26/2021   Incisional hernia 07/01/2021   H/O metal  allergy 05/01/2020   Insomnia 05/01/2020   Iron deficiency 10/26/2019   Hx of gastric bypass 06/29/2019   Migraine 04/28/2019   Situational depression 04/28/2019   Family history of colon cancer 01/19/2019   Hx of adenomatous colonic polyps 01/19/2019   Pain and swelling of right knee 12/20/2018   Intractable right heel pain 12/20/2018   Psychological factors affecting morbid obesity 01/27/2018   Protein deficiency 12/03/2017   Bilateral hip pain 10/17/2016   Left ureteral stone 04/11/2016   Low back pain 09/05/2015   Uterine fibroid 08/15/2015   Menorrhagia with irregular cycle 08/14/2015   Radiculitis of left cervical region 08/09/2015   Post-traumatic osteoarthritis of left knee 02/15/2015   History of bariatric surgery 02/06/2015   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   GERD (gastroesophageal reflux disease) 06/19/2014   History of continuous positive airway pressure (CPAP) therapy at home 06/19/2014   OSA on CPAP 06/19/2014   Snoring 05/11/2014   Mixed hyperlipidemia 04/10/2014   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   Vitamin D deficiency 01/11/2014   Heart palpitations 12/07/2013   Menopausal symptom 05/18/2012   Leg swelling 05/18/2012   History of kidney stones 01/28/2012   Left flank pain 01/28/2012   Microscopic  hematuria 01/28/2012   Sebaceous hyperplasia 10/24/2011   BACK PAIN, THORACIC REGION 07/25/2009   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/19/2006   OBESITY, NOS 05/19/2006   Major depressive disorder, recurrent episode 05/19/2006   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   PCP: Beatrice Lecher, MD REFERRING PROVIDER: Aundria Mems, MD REFERRING DIAG: [phone] (ICD-10-CM) - Bilateral hip pain  THERAPY DIAG:  Pain in right hip  Pain in left hip  Muscle weakness (generalized)  Rationale for Evaluation and Treatment Rehabilitation  ONSET DATE: 05/19/22  SUBJECTIVE:   SUBJECTIVE STATEMENT: Patient reports 3/10 pain in bilateral hips today,  states she continues to have "bone pain". Patient states she received a prescription for cymbalta but has not started it yet.   PERTINENT HISTORY: Long standing h/o bilateral hip pain after childbirth, weight loss s/p bariatric surgery 3 years ago, [employer] with cardiac ablation  PAIN:  Are you having pain? Yes: NPRS scale: 2-3, was a 4-5 over the weekend out of /10 Pain location: bilateral hip pain, pain into low back Pain description: sharp Aggravating factors: goes to an 8/10 when she wakes up at night and when first rising in the morning Relieving factors: pain meds , stretching helps *Had a hard time sleeping the past 2 nights  PRECAUTIONS: None  WEIGHT BEARING RESTRICTIONS No  FALLS:  Has patient fallen in last 6 months? No  PATIENT GOALS Strengthen hips to reduce pain  OBJECTIVE:  (Measures in this section from initial evaluation unless otherwise noted) PATIENT SURVEYS:  FOTO 62% (goal 67%)  MUSCLE LENGTH: Hamstrings: Right -15 deg; Left -22 deg Thomas test: Right WNLs ROM, but pain over greater trochanter  PALPATION: Bilateral pain along lateral aspect, IT Band, lateral SI joint Hypomobility noted with PA mobs in thoracic and lumbar spine-- general tightness  LOWER EXTREMITY ROM:   Lumbar A/ROM: Pain at end range flexion (in L5/S1 region) and sensation of "tight" Extension: painful with 50% reduced motion L SB= R side discomfort R SB= no pain WNLs  LOWER EXTREMITY MMT:  MMT Right eval Left eval  Hip flexion 5/5 5/5  Hip extension 4/5 5/5  Hip abduction 3/5 With pain 4-/5  Hip adduction 3+/5 3+/5  Hip internal rotation 5/5 5/5  Hip external rotation 5/5 5/5  Knee flexion 5/5 5/5  Knee extension 5/5 5/5  Ankle dorsiflexion 4+/5 5/5  Ankle plantarflexion    Ankle inversion    Ankle eversion     (Blank rows = not tested)  LOWER EXTREMITY SPECIAL TESTS:  Squat-- at wall, painful bilateral knees Standing heel raises can do bilateral, not  unilateral  GAIT: Distance walked: 100 ft Comments: gait characterized by antalgic pattern after sitting    [facility] Adult PT Treatment:                                                DATE: 11/11/2022 Therapeutic Exercise: LTR 10x3" Hip add ball squeeze 5x5" --> bridges + ball squeeze x10 S/L hip abd + ext x10 --> rainbows x10 Supine wide knee windshield wipers Prone hip IR AROM Bear plank 3x5" holds Quadruped hip extension + hip IR/ER Side stepping  Standing captain morgan  Crossed leg forward bend stretch (ITB/hip stretch)   [facility] Adult PT Treatment:  DATE: 11/05/2022 Therapeutic Exercise: Supine with moist heat on low and mid back: LTR x10 Hip add isometric ball squeeze x10 Clamshells red loop band x10 Bridges with red loop band x10 Modified double leg stretch (ball b/w knees) x10 Prone hip IR & ER AROM Bear plank holds 5x5" Cat/cow Long sitting SLR side to side over yoga egg 2x5 B Side stepping (no resistance) Supine ITB stretch w/strap 3x30" B SKTC stretch (B)    [facility] Adult PT Treatment:                                                DATE: 11/03/2022 Therapeutic Exercise: Hip isometrics: add (ball squeeze) & abd (gait belt) Bridges with gait belt (hip abd isometrics) Bent knee fall out blue loop band --> clamshells Diaphragmatic breathing + TA activation Manual Therapy: STM for thoracolumbar paraspinals (focus on L) Modalities: Moist heat low/mid back     [facility] Adult PT Treatment:                                                DATE: 10/27/22  Therapeutic Exercise: Prone PROM bilateral hips Iliopsoas release on ball R side due to pain felt with piriformis stretch Supine Piriformis stretch provokes anterior hip pain Manual Therapy: STM for lumbar multifidi, glut med, R sacral border, R QL PA mobs grade II-III lumbar spine L2-L5 Trigger Point Dry-Needling  Treatment instructions: Expect mild to moderate muscle  soreness. Patient verbalized understanding of these instructions and education. Patient Consent Given: Yes Education handout provided: Yes Muscles treated: lumbar multifid Treatment response/outcome: palpable lengthening, less tissue soreness    PATIENT EDUCATION:  Education details: Progressing HEP Person educated: Patient Education method: Explanation, demo, handout Education comprehension: verbalized understanding and returned demonstration  HOME EXERCISE PROGRAM: Access Code: [email or web address] URL: [email or web address] Date: 10/29/2022 Prepared by: Helane Gunther  Exercises - Prone Press Up  - 1 x daily - 7 x weekly - 1 sets - 10 reps - Cat Cow  - 1 x daily - 7 x weekly - 1 sets - 5-10 reps - Primal Push Up  - 1 x daily - 7 x weekly - 1 sets - 5 reps - Standing Heel Raises  - 1 x daily - 7 x weekly - 1 sets - 10-15 reps - Supine Hip Adduction Isometric with Ball  - 1 x daily - 7 x weekly - 3 sets - 10 reps - Bird Dog  - 1 x daily - 7 x weekly - 3 sets - 10 reps - Glute Med Wall Lean  - 1 x daily - 7 x weekly - 3 sets - 10 reps - Supine Knees to Chest with Swiss Ball  - 1 x daily - 7 x weekly - 3 sets - 10 reps - Prone Hip Internal Rotation AROM  - 1 x daily - 7 x weekly - 3 sets - 10 reps - Beginner Double Leg Stretch  - 1 x daily - 7 x weekly - 3 sets - 10 reps  ASSESSMENT:  CLINICAL IMPRESSION: Hip strengthening continued with focus on pelvic alignment and stability. Increased hip pain when standing on R LE during captain morgan exercise.   OBJECTIVE IMPAIRMENTS Abnormal gait, decreased activity tolerance, difficulty walking,  decreased strength, hypomobility, increased fascial restrictions, impaired flexibility, and pain.   GOALS: Goals reviewed with patient? Yes  SHORT TERM GOALS: Target date: 11/05/22  The patient will be indep with HEP Baseline:modifying prior HEP Goal status: MET  2.  The patient will report pain < or equal to 2/10 Baseline: 3/10, varies  up to 8/10 Goal status: IN PROGRESS  4.  The patient will improve R hip abductor strength to 4/5. Baseline: 3/5 Goal status: INITIAL  LONG TERM GOALS: Target date: 12/03/22  The patient will be indep with HEP progression. Baseline: for d/c Goal status: INITIAL  2.  The patient will improve FOTO to 67%. Baseline: 62% Goal status: IN PROGRESS (50% on 11/03/22)  3.  The patient will improve bilat hip abductor strength to 5/5. Baseline: see MMT chart Goal status: INITIAL  4.  The patient will improve bilat hamstring length to -10 from full extension. Baseline: -22 / -15 Goal status: INITIAL  5.  The patient will tolerate forward trunk flexion without pain. Baseline: end range pain Goal status: INITIAL  6.  The patient will be indep with home walking program and community wellness. Baseline: Member at [employer] Goal status: INITIAL   PLAN: PT FREQUENCY: 2x/week  PT DURATION: 8 weeks  PLANNED INTERVENTIONS: Therapeutic exercises, Therapeutic activity, Neuromuscular re-education, Balance training, Gait training, Patient/Family education, Self Care, Joint mobilization, Aquatic Therapy, Dry Needling, Electrical stimulation, Spinal mobilization, Moist heat, Taping, and Manual therapy  PLAN FOR NEXT SESSION: DN next visit with PT. Progress LE strengthening, core stability, spinal mobility, and tightness. STM for self mgmt of pain.    Hardin Negus, PTA 11/11/2022, 9:29 AM

## 2022-11-14 ENCOUNTER — Encounter: Payer: Self-pay | Admitting: Rehabilitative and Restorative Service Providers"

## 2022-11-14 ENCOUNTER — Ambulatory Visit: Payer: Managed Care, Other (non HMO) | Admitting: Rehabilitative and Restorative Service Providers"

## 2022-11-14 DIAGNOSIS — M6281 Muscle weakness (generalized): Secondary | ICD-10-CM

## 2022-11-14 DIAGNOSIS — M25551 Pain in right hip: Secondary | ICD-10-CM

## 2022-11-14 DIAGNOSIS — M25552 Pain in left hip: Secondary | ICD-10-CM

## 2022-11-14 NOTE — Therapy (Signed)
OUTPATIENT PHYSICAL THERAPY LOWER EXTREMITY TREATMENT   Patient Name: Roosvelt HarpsLoretta Secord MRN: [id number] DOB:January 09, 1971, 52 y.o., female Today's Date: 11/14/2022   PT End of Session - 11/14/22 0935     Visit Number 10    Number of Visits 16    Date for PT Re-Evaluation 12/07/22    Authorization Type cigna    PT Start Time 0935    PT Stop Time 1015    PT Time Calculation (min) 40 min    Activity Tolerance Patient tolerated treatment well    Behavior During Therapy WFL for tasks assessed/performed             Past Medical History:  Diagnosis Date   Allergic rhinitis    Arthritis    Depression    Dyslipidemia    Dysmenorrhea    Family history of adverse reaction to anesthesia    mother slow to wake up   Gall bladder disease    GERD (gastroesophageal reflux disease)    Headache    History of kidney stones    Infertility, female    Obesity    Pneumonia    Polycystic ovary disease    PONV (postoperative nausea and vomiting)    Sebaceous hyperplasia     on tretinoin   Sleep apnea    cpap   WPW (Wolff-Parkinson-White syndrome)    hx of ablation 2012   Past Surgical History:  Procedure Laterality Date   CESAREAN SECTION     x 2   gastric bypass surgery      INCISIONAL HERNIA REPAIR N/A 07/01/2021   Procedure: INCISIONAL HERNIA REPAIR WITH MESH;  Surgeon: Kinsinger, De BlanchLuke Aaron, MD;  Location: [facility];  Service: General;  Laterality: N/A;   LAPAROSCOPIC CHOLECYSTECTOMY     left foot surgery      SLEEVE GASTROPLASTY     SUPRAVENTRICULAR TACHYCARDIA ABLATION N/A 07/23/2011   Procedure: SUPRAVENTRICULAR TACHYCARDIA ABLATION;  Surgeon: Marinus MawGregg W Taylor, MD;  [facility] C[facility] LAB;  Service: Cardiovascular;  Laterality: N/A;   transthoracic echcardiogram  09/15/2006   Patient Active Problem List   Diagnosis Date Noted   Irregular periods 04/30/2022   Right upper quadrant pain 08/26/2021   Nausea 08/26/2021   Incisional hernia 07/01/2021   H/O metal allergy 05/01/2020    Insomnia 05/01/2020   Iron deficiency 10/26/2019   Hx of gastric bypass 06/29/2019   Migraine 04/28/2019   Situational depression 04/28/2019   Family history of colon cancer 01/19/2019   Hx of adenomatous colonic polyps 01/19/2019   Pain and swelling of right knee 12/20/2018   Intractable right heel pain 12/20/2018   Psychological factors affecting morbid obesity 01/27/2018   Protein deficiency 12/03/2017   Bilateral hip pain 10/17/2016   Left ureteral stone 04/11/2016   Low back pain 09/05/2015   Uterine fibroid 08/15/2015   Menorrhagia with irregular cycle 08/14/2015   Radiculitis of left cervical region 08/09/2015   Post-traumatic osteoarthritis of left knee 02/15/2015   History of bariatric surgery 02/06/2015   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   GERD (gastroesophageal reflux disease) 06/19/2014   History of continuous positive airway pressure (CPAP) therapy at home 06/19/2014   OSA on CPAP 06/19/2014   Snoring 05/11/2014   Mixed hyperlipidemia 04/10/2014   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   Vitamin D deficiency 01/11/2014   Heart palpitations 12/07/2013   Menopausal symptom 05/18/2012   Leg swelling 05/18/2012   History of kidney stones 01/28/2012   Left flank pain 01/28/2012   Microscopic hematuria 01/28/2012  Sebaceous hyperplasia 10/24/2011   BACK PAIN, THORACIC REGION 07/25/2009   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/19/2006   OBESITY, NOS 05/19/2006   Major depressive disorder, recurrent episode 05/19/2006   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   PCP: Nani Gasseratherine Metheney, MD REFERRING PROVIDER: Rodney Langtonhomas Thekkekandam, MD REFERRING DIAG: [phone],M25.552 (ICD-10-CM) - Bilateral hip pain  THERAPY DIAG:  Pain in right hip  Pain in left hip  Muscle weakness (generalized)  Rationale for Evaluation and Treatment Rehabilitation  ONSET DATE: 05/19/22  SUBJECTIVE:   SUBJECTIVE STATEMENT: The patient reports that sleep is still hard when positioning on the  hips. She is going to the gym and overall feels better today compared to how she has felt during flare up over the past couple of weeks. PERTINENT HISTORY: Long standing h/o bilateral hip pain after childbirth, weight loss s/p bariatric surgery 3 years ago, [employer] Parkinson's White with cardiac ablation PAIN:  Are you having pain? Yes: NPRS scale: 2-3, was a 4-5 over the weekend out of /10 Pain location: bilateral hip pain, pain into low back Pain description: sharp Aggravating factors: goes to an 8/10 when she wakes up at night and when first rising in the morning Relieving factors: pain meds , stretching helps *Had a hard time sleeping the past 2 nights PRECAUTIONS: None WEIGHT BEARING RESTRICTIONS No FALLS:  Has patient fallen in last 6 months? No PATIENT GOALS Strengthen hips to reduce pain  OBJECTIVE:  (Measures in this section from initial evaluation unless otherwise noted) PATIENT SURVEYS:  FOTO 62% (goal 67%)  MUSCLE LENGTH: Hamstrings: Right -15 deg; Left -22 deg Thomas test: Right WNLs ROM, but pain over greater trochanter  PALPATION: Bilateral pain along lateral aspect, IT Band, lateral SI joint Hypomobility noted with PA mobs in thoracic and lumbar spine-- general tightness  LOWER EXTREMITY ROM:   Lumbar A/ROM: Pain at end range flexion (in L5/S1 region) and sensation of "tight" Extension: painful with 50% reduced motion L SB= R side discomfort R SB= no pain WNLs  LOWER EXTREMITY MMT:  MMT Right eval Left eval  Hip flexion 5/5 5/5  Hip extension 4/5 5/5  Hip abduction 3/5 With pain 4-/5  Hip adduction 3+/5 3+/5  Hip internal rotation 5/5 5/5  Hip external rotation 5/5 5/5  Knee flexion 5/5 5/5  Knee extension 5/5 5/5  Ankle dorsiflexion 4+/5 5/5  Ankle plantarflexion    Ankle inversion    Ankle eversion     (Blank rows = not tested)  LOWER EXTREMITY SPECIAL TESTS:  Squat-- at wall, painful bilateral knees Standing heel raises can do bilateral, not  unilateral  GAIT: Distance walked: 100 ft Comments: gait characterized by antalgic pattern after sitting   [facility] Adult PT Treatment:                                                DATE: 11/14/22 Therapeutic Exercise: Quadriped Thread the needle x 5 reps R and L sides Cat/cow for warm up Supine Wide knee windshield wipers Patient c/o significant R lateral/distal knee tightness PT performed supine HS stretch and this led to flare/spasm of R anterior hip and R lateral knee catching sensation Tender to palpation along lateral IT Band and HS Hip flexion on/off mat x 12 reps Thomas test stretch with gentle overpressure Bridge with green band for isometric hooklying abduction Prone Deep spinal rotation stretch R and L x  2 reps with 30 seconds hold Sidelying Reverse clam with green tband x 12 reps Hip adductor strengthening x 12 reps Standing Green theraband around ankle x 5-8 reps all planes SLR Hip flexion stretch R  [facility] Adult PT Treatment:                                                DATE: 11/11/2022 Therapeutic Exercise: LTR 10x3" Hip add ball squeeze 5x5" --> bridges + ball squeeze x10 S/L hip abd + ext x10 --> rainbows x10 Supine wide knee windshield wipers Prone hip IR AROM Bear plank 3x5" holds Quadruped hip extension + hip IR/ER Side stepping  Standing captain morgan  Crossed leg forward bend stretch (ITB/hip stretch)  PATIENT EDUCATION:  Education details: Progressing HEP Person educated: Patient Education method: Explanation, demo, handout Education comprehension: verbalized understanding and returned demonstration  HOME EXERCISE PROGRAM: Access Code: [email or web address] URL: [email or web address] Date: 11/14/2022 Prepared by: Margretta Ditty  Exercises - Prone Press Up  - 1 x daily - 7 x weekly - 1 sets - 10 reps - Cat Cow  - 1 x daily - 7 x weekly - 1 sets - 5-10 reps - Primal Push Up  - 1 x daily - 7 x weekly - 1 sets - 5 reps - Standing Heel Raises  -  1 x daily - 7 x weekly - 1 sets - 10-15 reps - Supine Hip Adduction Isometric with Ball  - 1 x daily - 7 x weekly - 3 sets - 10 reps - Bird Dog  - 1 x daily - 7 x weekly - 3 sets - 10 reps - Glute Med Wall Lean  - 1 x daily - 7 x weekly - 3 sets - 10 reps - Supine Knees to Chest with Swiss Ball  - 1 x daily - 7 x weekly - 3 sets - 10 reps - Prone Hip Internal Rotation AROM  - 1 x daily - 7 x weekly - 3 sets - 10 reps - Beginner Double Leg Stretch  - 1 x daily - 7 x weekly - 3 sets - 10 reps - Standing Hip Flexion with Resistance  - 1 x daily - 2 x weekly - 1 sets - 5 reps - Standing Hip Adduction with Resistance  - 1 x daily - 2 x weekly - 1 sets - 5 reps - Standing Hip Extension with Resistance  - 1 x daily - 2 x weekly - 1 sets - 5 reps - Standing Hip Abduction with Resistance  - 1 x daily - 2 x weekly - 1 sets - 5 reps  ASSESSMENT:  CLINICAL IMPRESSION: Patient continues with hip instability and discomfort. PT continuing to progress to tolerance. She wants to do HEP with band, noted # of exercises and dividing due to time constraints. Plan to check last STG next visit and continue working to LTGs.   OBJECTIVE IMPAIRMENTS Abnormal gait, decreased activity tolerance, difficulty walking, decreased strength, hypomobility, increased fascial restrictions, impaired flexibility, and pain.   GOALS: Goals reviewed with patient? Yes  SHORT TERM GOALS: Target date: 11/05/22  The patient will be indep with HEP Baseline:modifying prior HEP Goal status: MET  2.  The patient will report pain < or equal to 2/10 Baseline: 3/10, varies up to 8/10 Goal status: IN PROGRESS  4.  The patient will improve R hip  abductor strength to 4/5. Baseline: 3/5 Goal status: INITIAL  LONG TERM GOALS: Target date: 12/03/22  The patient will be indep with HEP progression. Baseline: for d/c Goal status: INITIAL  2.  The patient will improve FOTO to 67%. Baseline: 62% Goal status: IN PROGRESS (50% on  11/03/22)  3.  The patient will improve bilat hip abductor strength to 5/5. Baseline: see MMT chart Goal status: INITIAL  4.  The patient will improve bilat hamstring length to -10 from full extension. Baseline: -22 / -15 Goal status: INITIAL  5.  The patient will tolerate forward trunk flexion without pain. Baseline: end range pain Goal status: INITIAL  6.  The patient will be indep with home walking program and community wellness. Baseline: Member at [employer] Goal status: INITIAL   PLAN: PT FREQUENCY: 2x/week  PT DURATION: 8 weeks  PLANNED INTERVENTIONS: Therapeutic exercises, Therapeutic activity, Neuromuscular re-education, Balance training, Gait training, Patient/Family education, Self Care, Joint mobilization, Aquatic Therapy, Dry Needling, Electrical stimulation, Spinal mobilization, Moist heat, Taping, and Manual therapy  PLAN FOR NEXT SESSION: DN next visit with PT  (did not do due to pain improving and pain mostly along sacral border) . Progress LE strengthening, core stability, spinal mobility, and tightness. STM for self mgmt of pain.    Jovani Colquhoun, PT 11/14/2022, 9:36 AM

## 2022-11-18 ENCOUNTER — Ambulatory Visit: Payer: Managed Care, Other (non HMO)

## 2022-11-18 DIAGNOSIS — M25552 Pain in left hip: Secondary | ICD-10-CM

## 2022-11-18 DIAGNOSIS — M6281 Muscle weakness (generalized): Secondary | ICD-10-CM

## 2022-11-18 DIAGNOSIS — M25551 Pain in right hip: Secondary | ICD-10-CM | POA: Diagnosis not present

## 2022-11-18 NOTE — Therapy (Signed)
OUTPATIENT PHYSICAL THERAPY LOWER EXTREMITY TREATMENT   Patient Name: Laura [name] MRN: [id number] DOB:1970-12-03, 52 y.o., female Today's Date: 11/18/2022   PT End of Session - 11/18/22 1018     Visit Number 11    Number of Visits 16    Date for PT Re-Evaluation 12/07/22    Authorization Type cigna    PT Start Time 1017    PT Stop Time 1100    PT Time Calculation (min) 43 min    Activity Tolerance Patient tolerated treatment well    Behavior During Therapy WFL for tasks assessed/performed             Past Medical History:  Diagnosis Date   Allergic rhinitis    Arthritis    Depression    Dyslipidemia    Dysmenorrhea    Family history of adverse reaction to anesthesia    mother slow to wake up   Gall bladder disease    GERD (gastroesophageal reflux disease)    Headache    History of kidney stones    Infertility, female    Obesity    Pneumonia    Polycystic ovary disease    PONV (postoperative nausea and vomiting)    Sebaceous hyperplasia     on tretinoin   Sleep apnea    cpap   WPW (Wolff-Parkinson-White syndrome)    hx of ablation 2012   Past Surgical History:  Procedure Laterality Date   CESAREAN SECTION     x 2   gastric bypass surgery      INCISIONAL HERNIA REPAIR N/A 07/01/2021   Procedure: INCISIONAL HERNIA REPAIR WITH MESH;  Surgeon: Kinsinger, De Blanch, MD;  Location: [facility];  Service: General;  Laterality: N/A;   LAPAROSCOPIC CHOLECYSTECTOMY     left foot surgery      SLEEVE GASTROPLASTY     SUPRAVENTRICULAR TACHYCARDIA ABLATION N/A 07/23/2011   Procedure: SUPRAVENTRICULAR TACHYCARDIA ABLATION;  Surgeon: Marinus Maw, MD;  Location: [facility] CATH LAB;  Service: Cardiovascular;  Laterality: N/A;   transthoracic echcardiogram  09/15/2006   Patient Active Problem List   Diagnosis Date Noted   Irregular periods 04/30/2022   Right upper quadrant pain 08/26/2021   Nausea 08/26/2021   Incisional hernia 07/01/2021   H/O metal allergy 05/01/2020    Insomnia 05/01/2020   Iron deficiency 10/26/2019   Hx of gastric bypass 06/29/2019   Migraine 04/28/2019   Situational depression 04/28/2019   Family history of colon cancer 01/19/2019   Hx of adenomatous colonic polyps 01/19/2019   Pain and swelling of right knee 12/20/2018   Intractable right heel pain 12/20/2018   Psychological factors affecting morbid obesity 01/27/2018   Protein deficiency 12/03/2017   Bilateral hip pain 10/17/2016   Left ureteral stone 04/11/2016   Low back pain 09/05/2015   Uterine fibroid 08/15/2015   Menorrhagia with irregular cycle 08/14/2015   Radiculitis of left cervical region 08/09/2015   Post-traumatic osteoarthritis of left knee 02/15/2015   History of bariatric surgery 02/06/2015   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   GERD (gastroesophageal reflux disease) 06/19/2014   History of continuous positive airway pressure (CPAP) therapy at home 06/19/2014   OSA on CPAP 06/19/2014   Snoring 05/11/2014   Mixed hyperlipidemia 04/10/2014   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   Vitamin D deficiency 01/11/2014   Heart palpitations 12/07/2013   Menopausal symptom 05/18/2012   Leg swelling 05/18/2012   History of kidney stones 01/28/2012   Left flank pain 01/28/2012   Microscopic hematuria 01/28/2012  Sebaceous hyperplasia 10/24/2011   BACK PAIN, THORACIC REGION 07/25/2009   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/19/2006   OBESITY, NOS 05/19/2006   Major depressive disorder, recurrent episode 05/19/2006   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   PCP: Nani Gasseratherine Metheney, MD REFERRING PROVIDER: Rodney Langtonhomas Thekkekandam, MD REFERRING DIAG: [phone],M25.552 (ICD-10-CM) - Bilateral hip pain  THERAPY DIAG:  Pain in right hip  Pain in left hip  Muscle weakness (generalized)  Rationale for Evaluation and Treatment Rehabilitation  ONSET DATE: 05/19/22  SUBJECTIVE:   SUBJECTIVE STATEMENT: Patient reports her hips are feeling much better, states she had some  discomfort during long car ride but no significant pain.  PERTINENT HISTORY: Long standing h/o bilateral hip pain after childbirth, weight loss s/p bariatric surgery 3 years ago, [employer] Parkinson's White with cardiac ablation PAIN:  Are you having pain? Yes: NPRS scale: 2-3, was a 4-5 over the weekend out of /10 Pain location: bilateral hip pain, pain into low back Pain description: sharp Aggravating factors: goes to an 8/10 when she wakes up at night and when first rising in the morning Relieving factors: pain meds , stretching helps *Had a hard time sleeping the past 2 nights PRECAUTIONS: None WEIGHT BEARING RESTRICTIONS No FALLS:  Has patient fallen in last 6 months? No PATIENT GOALS Strengthen hips to reduce pain  OBJECTIVE:  (Measures in this section from initial evaluation unless otherwise noted) PATIENT SURVEYS:  FOTO 62% (goal 67%)  MUSCLE LENGTH: Hamstrings: Right -15 deg; Left -22 deg Thomas test: Right WNLs ROM, but pain over greater trochanter  PALPATION: Bilateral pain along lateral aspect, IT Band, lateral SI joint Hypomobility noted with PA mobs in thoracic and lumbar spine-- general tightness  LOWER EXTREMITY ROM:   Lumbar A/ROM: Pain at end range flexion (in L5/S1 region) and sensation of "tight" Extension: painful with 50% reduced motion L SB= R side discomfort R SB= no pain WNLs  LOWER EXTREMITY MMT:  MMT Right eval Left eval  Hip flexion 5/5 5/5  Hip extension 4/5 5/5  Hip abduction 3/5 With pain 4-/5  Hip adduction 3+/5 3+/5  Hip internal rotation 5/5 5/5  Hip external rotation 5/5 5/5  Knee flexion 5/5 5/5  Knee extension 5/5 5/5  Ankle dorsiflexion 4+/5 5/5  Ankle plantarflexion    Ankle inversion    Ankle eversion     (Blank rows = not tested)  LOWER EXTREMITY SPECIAL TESTS:  Squat-- at wall, painful bilateral knees Standing heel raises can do bilateral, not unilateral  GAIT: Distance walked: 100 ft Comments: gait characterized by  antalgic pattern after sitting   [facility] Adult PT Treatment:                                                DATE: 11/18/2022 Therapeutic Exercise: Cat/cow  Thred the needle B Primal plank Wide leg windshield wiper legs (supine) Side lying over orange SB (QL/lateral body stretch) Thomas stretch 3x30" B S/L clamshells & reverse clamshells GTB 2x10 B Figure 4 LTR x10 B S/L hip abduction in extension   [facility]                                                DATE: 11/14/22 Therapeutic Exercise: [place] LivingsQuadriped Thread  the needle x 5 reps R and L sides Cat/cow for warm up Supine Wide knee windshield wipers Patient c/o significant R lateral/distal knee tightness PT performed supine HS stretch and this led to flare/spasm of R anterior hip and R lateral knee catching sensation Tender to palpation along lateral IT Band and HS Hip flexion on/off mat x 12 reps Thomas test stretch with gentle overpressure Bridge with green band for isometric hooklying abduction Prone Deep spinal rotation stretch R and L x 2 reps with 30 seconds hold Sidelying Reverse clam with green tband x 12 reps Hip adductor strengthening x 12 reps Standing Green theraband around ankle x 5-8 reps all planes SLR Hip flexion stretch R  [facility] Adult PT Treatment:                                                DATE: 11/11/2022 Therapeutic Exercise: LTR 10x3" Hip add ball squeeze 5x5" --> bridges + ball squeeze x10 S/L hip abd + ext x10 --> rainbows x10 Supine wide knee windshield wipers Prone hip IR AROM Bear plank 3x5" holds Quadruped hip extension + hip IR/ER Side stepping  Standing captain morgan  Crossed leg forward bend stretch (ITB/hip stretch)  PATIENT EDUCATION:  Education details: Progressing HEP Person educated: Patient Education method: Explanation, demo, handout Education comprehension: verbalized understanding and returned demonstration  HOME EXERCISE PROGRAM: Access Code: [email or web address] URL:  [email or web address] Date: 11/18/2022 Prepared by: Carlynn Herald  Exercises - Prone Press Up  - 1 x daily - 7 x weekly - 1 sets - 10 reps - Cat Cow  - 1 x daily - 7 x weekly - 1 sets - 5-10 reps - Primal Push Up  - 1 x daily - 7 x weekly - 1 sets - 5 reps - Standing Heel Raises  - 1 x daily - 7 x weekly - 1 sets - 10-15 reps - Supine Hip Adduction Isometric with Ball  - 1 x daily - 7 x weekly - 3 sets - 10 reps - Bird Dog  - 1 x daily - 7 x weekly - 3 sets - 10 reps - Glute Med Wall Lean  - 1 x daily - 7 x weekly - 3 sets - 10 reps - Supine Knees to Chest with Swiss Ball  - 1 x daily - 7 x weekly - 3 sets - 10 reps - Prone Hip Internal Rotation AROM  - 1 x daily - 7 x weekly - 3 sets - 10 reps - Beginner Double Leg Stretch  - 1 x daily - 7 x weekly - 3 sets - 10 reps - Standing Hip Flexion with Resistance  - 1 x daily - 2 x weekly - 1 sets - 5 reps - Standing Hip Adduction with Resistance  - 1 x daily - 2 x weekly - 1 sets - 5 reps - Standing Hip Extension with Resistance  - 1 x daily - 2 x weekly - 1 sets - 5 reps - Standing Hip Abduction with Resistance  - 1 x daily - 2 x weekly - 1 sets - 5 reps - Sidelying Hip Extension in Abduction  - 1 x daily - 7 x weekly - 3 sets - 10 reps  ASSESSMENT:  CLINICAL IMPRESSION: Hip strengthening continued with increased resistance. Cueing proper pelvis alignment improved anterior hip lengthening with thomas stretch. Patient able  to complete all exercises with no exacerbation of symptoms.   OBJECTIVE IMPAIRMENTS Abnormal gait, decreased activity tolerance, difficulty walking, decreased strength, hypomobility, increased fascial restrictions, impaired flexibility, and pain.   GOALS: Goals reviewed with patient? Yes  SHORT TERM GOALS: Target date: 11/05/22  The patient will be indep with HEP Baseline:modifying prior HEP Goal status: MET  2.  The patient will report pain < or equal to 2/10 Baseline: 3/10, varies up to 8/10 Goal  status: IN PROGRESS  4.  The patient will improve R hip abductor strength to 4/5. Baseline: 3/5 Goal status: INITIAL  LONG TERM GOALS: Target date: 12/03/22  The patient will be indep with HEP progression. Baseline: for d/c Goal status: INITIAL  2.  The patient will improve FOTO to 67%. Baseline: 62% Goal status: IN PROGRESS (50% on 11/03/22)  3.  The patient will improve bilat hip abductor strength to 5/5. Baseline: see MMT chart Goal status: INITIAL  4.  The patient will improve bilat hamstring length to -10 from full extension. Baseline: -22 / -15 Goal status: INITIAL  5.  The patient will tolerate forward trunk flexion without pain. Baseline: end range pain Goal status: INITIAL  6.  The patient will be indep with home walking program and community wellness. Baseline: Member at [employer] Goal status: INITIAL   PLAN: PT FREQUENCY: 2x/week  PT DURATION: 8 weeks  PLANNED INTERVENTIONS: Therapeutic exercises, Therapeutic activity, Neuromuscular re-education, Balance training, Gait training, Patient/Family education, Self Care, Joint mobilization, Aquatic Therapy, Dry Needling, Electrical stimulation, Spinal mobilization, Moist heat, Taping, and Manual therapy  PLAN FOR NEXT SESSION: Check with patient about DN. Progress LE strengthening, core stability, spinal mobility, and tightness. STM for self mgmt of pain.    Sanjuana Mae, PTA 11/18/2022, 11:02 AM

## 2022-11-21 ENCOUNTER — Ambulatory Visit: Payer: Managed Care, Other (non HMO)

## 2022-11-21 DIAGNOSIS — M25552 Pain in left hip: Secondary | ICD-10-CM

## 2022-11-21 DIAGNOSIS — M6281 Muscle weakness (generalized): Secondary | ICD-10-CM

## 2022-11-21 DIAGNOSIS — M25551 Pain in right hip: Secondary | ICD-10-CM

## 2022-11-21 NOTE — Therapy (Addendum)
OUTPATIENT PHYSICAL THERAPY LOWER EXTREMITY TREATMENT   Patient Name: Laura [name] MRN: [id number] DOB:March 01, 1971, 52 y.o., female Today's Date: 11/21/2022   PT End of Session - 11/21/22 0937     Visit Number 12    Number of Visits 16    Date for PT Re-Evaluation 12/07/22    Authorization Type cigna    PT Start Time [phone]    PT Stop Time 1018    PT Time Calculation (min) 41 min    Activity Tolerance Patient tolerated treatment well    Behavior During Therapy WFL for tasks assessed/performed             Past Medical History:  Diagnosis Date   Allergic rhinitis    Arthritis    Depression    Dyslipidemia    Dysmenorrhea    Family history of adverse reaction to anesthesia    mother slow to wake up   Gall bladder disease    GERD (gastroesophageal reflux disease)    Headache    History of kidney stones    Infertility, female    Obesity    Pneumonia    Polycystic ovary disease    PONV (postoperative nausea and vomiting)    Sebaceous hyperplasia     on tretinoin   Sleep apnea    cpap   WPW (Wolff-Parkinson-White syndrome)    hx of ablation 2012   Past Surgical History:  Procedure Laterality Date   CESAREAN SECTION     x 2   gastric bypass surgery      INCISIONAL HERNIA REPAIR N/A 07/01/2021   Procedure: INCISIONAL HERNIA REPAIR WITH MESH;  Surgeon: Kinsinger, De Blanch, MD;  Location: [facility];  Service: General;  Laterality: N/A;   LAPAROSCOPIC CHOLECYSTECTOMY     left foot surgery      SLEEVE GASTROPLASTY     SUPRAVENTRICULAR TACHYCARDIA ABLATION N/A 07/23/2011   Procedure: SUPRAVENTRICULAR TACHYCARDIA ABLATION;  Surgeon: Marinus Maw, MD;  Location: [facility] CATH LAB;  Service: Cardiovascular;  Laterality: N/A;   transthoracic echcardiogram  09/15/2006   Patient Active Problem List   Diagnosis Date Noted   Irregular periods 04/30/2022   Right upper quadrant pain 08/26/2021   Nausea 08/26/2021   Incisional hernia 07/01/2021   H/O metal allergy 05/01/2020    Insomnia 05/01/2020   Iron deficiency 10/26/2019   Hx of gastric bypass 06/29/2019   Migraine 04/28/2019   Situational depression 04/28/2019   Family history of colon cancer 01/19/2019   Hx of adenomatous colonic polyps 01/19/2019   Pain and swelling of right knee 12/20/2018   Intractable right heel pain 12/20/2018   Psychological factors affecting morbid obesity 01/27/2018   Protein deficiency 12/03/2017   Bilateral hip pain 10/17/2016   Left ureteral stone 04/11/2016   Low back pain 09/05/2015   Uterine fibroid 08/15/2015   Menorrhagia with irregular cycle 08/14/2015   Radiculitis of left cervical region 08/09/2015   Post-traumatic osteoarthritis of left knee 02/15/2015   History of bariatric surgery 02/06/2015   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   GERD (gastroesophageal reflux disease) 06/19/2014   History of continuous positive airway pressure (CPAP) therapy at home 06/19/2014   OSA on CPAP 06/19/2014   Snoring 05/11/2014   Mixed hyperlipidemia 04/10/2014   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   Vitamin D deficiency 01/11/2014   Heart palpitations 12/07/2013   Menopausal symptom 05/18/2012   Leg swelling 05/18/2012   History of kidney stones 01/28/2012   Left flank pain 01/28/2012   Microscopic hematuria 01/28/2012  Sebaceous hyperplasia 10/24/2011   BACK PAIN, THORACIC REGION 07/25/2009   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/19/2006   OBESITY, NOS 05/19/2006   Major depressive disorder, recurrent episode 05/19/2006   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   PCP: Nani Gasser, MD REFERRING PROVIDER: Rodney Langton, MD REFERRING DIAG: [phone] (ICD-10-CM) - Bilateral hip pain  THERAPY DIAG:  Pain in right hip  Pain in left hip  Muscle weakness (generalized)  Rationale for Evaluation and Treatment Rehabilitation  ONSET DATE: 05/19/22  SUBJECTIVE:  SUBJECTIVE STATEMENT: Patient reports soreness along R lateral thigh today; states she reached  10,000 steps yesterday due to a busy work day. Patient reports 2/10 pain in bilateral hips, states it is is tender to the touch and she continues to have difficulty with lifting leg into trukck.  PERTINENT HISTORY: Long standing h/o bilateral hip pain after childbirth, weight loss s/p bariatric surgery 3 years ago, [employer] with cardiac ablation PAIN:  Are you having pain? Yes: NPRS scale: 2-3, was a 4-5 over the weekend out of /10 Pain location: bilateral hip pain, pain into low back Pain description: sharp Aggravating factors: goes to an 8/10 when she wakes up at night and when first rising in the morning Relieving factors: pain meds , stretching helps *Had a hard time sleeping the past 2 nights PRECAUTIONS: None WEIGHT BEARING RESTRICTIONS No FALLS:  Has patient fallen in last 6 months? No PATIENT GOALS Strengthen hips to reduce pain  OBJECTIVE:  (Measures in this section from initial evaluation unless otherwise noted) PATIENT SURVEYS:  FOTO 62% (goal 67%)  MUSCLE LENGTH: Hamstrings: Right -15 deg; Left -22 deg Thomas test: Right WNLs ROM, but pain over greater trochanter  PALPATION: Bilateral pain along lateral aspect, IT Band, lateral SI joint Hypomobility noted with PA mobs in thoracic and lumbar spine-- general tightness  LOWER EXTREMITY ROM:   Lumbar A/ROM: Pain at end range flexion (in L5/S1 region) and sensation of "tight" Extension: painful with 50% reduced motion L SB= R side discomfort R SB= no pain WNLs  LOWER EXTREMITY MMT:  MMT Right eval Left eval  Hip flexion 5/5 5/5  Hip extension 4/5 5/5  Hip abduction 3/5 With pain 4-/5  Hip adduction 3+/5 3+/5  Hip internal rotation 5/5 5/5  Hip external rotation 5/5 5/5  Knee flexion 5/5 5/5  Knee extension 5/5 5/5  Ankle dorsiflexion 4+/5 5/5  Ankle plantarflexion    Ankle inversion    Ankle eversion     (Blank rows = not tested)  LOWER EXTREMITY SPECIAL TESTS:  Squat-- at wall, painful  bilateral knees Standing heel raises can do bilateral, not unilateral  GAIT: Distance walked: 100 ft Comments: gait characterized by antalgic pattern after sitting   [facility] Adult PT Treatment:                                                DATE: 11/21/2022 Therapeutic Exercise: Recumbent bike warm-up L 5 x 5 min HS/ITB stretches w/strap DKTC --> SKTC S/L clamshells x15 S/L straight leg hip abd x10 Prone hip extension x10 B Primal plank 3x10" Thomas stretch 2x30" B Manual Therapy: STM for bilat IT bands Trigger Point Dry-Needling  Treatment instructions: Expect mild to moderate muscle soreness.  Patient Consent Given: Yes Education handout provided: Previously provided Muscles treated: bilat quad and HS at IT band insertion, distally Treatment response/outcome: palpable lengthening  [facility] Adult PT Treatment:                                                DATE: 11/18/2022 Therapeutic Exercise: Cat/cow  Thred the needle B Primal plank Wide leg windshield wiper legs (supine) Side lying over orange SB (QL/lateral body stretch) Thomas stretch 3x30" B S/L clamshells & reverse clamshells GTB 2x10 B Figure 4 LTR x10 B S/L hip abduction in extension   PATIENT EDUCATION:  Education details: Progressing HEP Person educated: Patient Education method: Explanation, demo, handout Education comprehension: verbalized understanding and returned demonstration  HOME EXERCISE PROGRAM: Access Code: [email or web address] URL: [email or web address] Date: 11/18/2022 Prepared by: Carlynn Herald  Exercises - Prone Press Up  - 1 x daily - 7 x weekly - 1 sets - 10 reps - Cat Cow  - 1 x daily - 7 x weekly - 1 sets - 5-10 reps - Primal Push Up  - 1 x daily - 7 x weekly - 1 sets - 5 reps - Standing Heel Raises  - 1 x daily - 7 x weekly - 1 sets - 10-15 reps - Supine Hip Adduction Isometric with Ball  - 1 x daily - 7 x weekly - 3 sets - 10 reps - Bird Dog  - 1 x daily - 7 x weekly - 3 sets - 10  reps - Glute Med Wall Lean  - 1 x daily - 7 x weekly - 3 sets - 10 reps - Supine Knees to Chest with Swiss Ball  - 1 x daily - 7 x weekly - 3 sets - 10 reps - Prone Hip Internal Rotation AROM  - 1 x daily - 7 x weekly - 3 sets - 10 reps - Beginner Double Leg Stretch  - 1 x daily - 7 x weekly - 3 sets - 10 reps - Standing Hip Flexion with Resistance  - 1 x daily - 2 x weekly - 1 sets - 5 reps - Standing Hip Adduction with Resistance  - 1 x daily - 2 x weekly - 1 sets - 5 reps - Standing Hip Extension with Resistance  - 1 x daily - 2 x weekly - 1 sets - 5 reps - Standing Hip Abduction with Resistance  - 1 x daily - 2 x weekly - 1 sets - 5 reps - Sidelying Hip Extension in Abduction  - 1 x daily - 7 x weekly - 3 sets - 10 reps  ASSESSMENT:  CLINICAL IMPRESSION: Patient able to hold quadruped plank longer, demonstrating improved postural and core strength. Resistance decreased during side lying hip abd exercises due to increased soreness after dry needling treatment.  OBJECTIVE IMPAIRMENTS Abnormal gait, decreased activity tolerance, difficulty walking, decreased strength, hypomobility, increased fascial restrictions, impaired flexibility, and pain.   GOALS: Goals reviewed with patient? Yes  SHORT TERM GOALS: Target date: 11/05/22  The patient will be indep with HEP Baseline:modifying prior HEP Goal status: MET  2.  The patient will report pain < or equal to 2/10 Baseline: 3/10, varies up to 8/10 Goal status: IN PROGRESS  4.  The patient will improve R hip abductor strength to 4/5. Baseline: 3/5 Goal status: INITIAL  LONG TERM GOALS: Target date: 12/03/22  The patient will be indep with HEP progression. Baseline: for d/c Goal status: INITIAL  2.  The patient will improve FOTO to  67%. Baseline: 62% Goal status: IN PROGRESS (50% on 11/03/22)  3.  The patient will improve bilat hip abductor strength to 5/5. Baseline: see MMT chart Goal status: INITIAL  4.  The patient will  improve bilat hamstring length to -10 from full extension. Baseline: -22 / -15 Goal status: INITIAL  5.  The patient will tolerate forward trunk flexion without pain. Baseline: end range pain Goal status: INITIAL  6.  The patient will be indep with home walking program and community wellness. Baseline: Member at [employer] Goal status: INITIAL   PLAN: PT FREQUENCY: 2x/week  PT DURATION: 8 weeks  PLANNED INTERVENTIONS: Therapeutic exercises, Therapeutic activity, Neuromuscular re-education, Balance training, Gait training, Patient/Family education, Self Care, Joint mobilization, Aquatic Therapy, Dry Needling, Electrical stimulation, Spinal mobilization, Moist heat, Taping, and Manual therapy  PLAN FOR NEXT SESSION: Progress LE strengthening, core stability, spinal mobility, and tightness. STM for self mgmt of pain.    WEAVER,CHRISTINA, PT  Sanjuana Mae, PTA 11/21/2022, 10:20 AM

## 2022-11-26 ENCOUNTER — Ambulatory Visit: Payer: Managed Care, Other (non HMO) | Admitting: Physical Therapy

## 2022-11-27 ENCOUNTER — Ambulatory Visit: Payer: Managed Care, Other (non HMO)

## 2022-11-27 DIAGNOSIS — M25552 Pain in left hip: Secondary | ICD-10-CM

## 2022-11-27 DIAGNOSIS — M25551 Pain in right hip: Secondary | ICD-10-CM

## 2022-11-27 DIAGNOSIS — M6281 Muscle weakness (generalized): Secondary | ICD-10-CM

## 2022-11-27 NOTE — Therapy (Signed)
OUTPATIENT PHYSICAL THERAPY LOWER EXTREMITY TREATMENT   Patient Name: Laura [name] MRN: [id number] DOB:[birth year], 52 y.o., female Today's Date: 11/27/2022   PT End of Session - 11/27/22 0849     Visit Number 13    Number of Visits 16    Date for PT Re-Evaluation 12/07/22    Authorization Type cigna    PT Start Time 0848    PT Stop Time 0928    PT Time Calculation (min) 40 min    Activity Tolerance Patient tolerated treatment well    Behavior During Therapy [facility] for tasks assessed/performed             Past Medical History:  Diagnosis Date   Allergic rhinitis    Arthritis    Depression    Dyslipidemia    Dysmenorrhea    Family history of adverse reaction to anesthesia    mother slow to wake up   Gall bladder disease    GERD (gastroesophageal reflux disease)    Headache    History of kidney stones    Infertility, female    Obesity    Pneumonia    Polycystic ovary disease    PONV (postoperative nausea and vomiting)    Sebaceous hyperplasia     on tretinoin   Sleep apnea    cpap   WPW (Wolff-Parkinson-White syndrome)    hx of ablation 2012   Past Surgical History:  Procedure Laterality Date   CESAREAN SECTION     x 2   gastric bypass surgery      INCISIONAL HERNIA REPAIR N/A 07/01/2021   Procedure: INCISIONAL HERNIA REPAIR WITH MESH;  Surgeon: Kinsinger, De Blanch, MD;  Location: [facility];  Service: General;  Laterality: N/A;   LAPAROSCOPIC CHOLECYSTECTOMY     left foot surgery      SLEEVE GASTROPLASTY     SUPRAVENTRICULAR TACHYCARDIA ABLATION N/A 07/23/2011   Procedure: SUPRAVENTRICULAR TACHYCARDIA ABLATION;  Surgeon: Marinus Maw, MD;  Location: [facility] CATH LAB;  Service: Cardiovascular;  Laterality: N/A;   transthoracic echcardiogram  09/15/2006   Patient Active Problem List   Diagnosis Date Noted   Irregular periods 04/30/2022   Right upper quadrant pain 08/26/2021   Nausea 08/26/2021   Incisional hernia 07/01/2021   H/O metal allergy 05/01/2020    Insomnia 05/01/2020   Iron deficiency 10/26/2019   Hx of gastric bypass 06/29/2019   Migraine 04/28/2019   Situational depression 04/28/2019   Family history of colon cancer 01/19/2019   Hx of adenomatous colonic polyps 01/19/2019   Pain and swelling of right knee 12/20/2018   Intractable right heel pain 12/20/2018   Psychological factors affecting morbid obesity 01/27/2018   Protein deficiency 12/03/2017   Bilateral hip pain 10/17/2016   Left ureteral stone 04/11/2016   Low back pain 09/05/2015   Uterine fibroid 08/15/2015   Menorrhagia with irregular cycle 08/14/2015   Radiculitis of left cervical region 08/09/2015   Post-traumatic osteoarthritis of left knee 02/15/2015   History of bariatric surgery 02/06/2015   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   GERD (gastroesophageal reflux disease) 06/19/2014   History of continuous positive airway pressure (CPAP) therapy at home 06/19/2014   OSA on CPAP 06/19/2014   Snoring 05/11/2014   Mixed hyperlipidemia 04/10/2014   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   Vitamin D deficiency 01/11/2014   Heart palpitations 12/07/2013   Menopausal symptom 05/18/2012   Leg swelling 05/18/2012   History of kidney stones 01/28/2012   Left flank pain 01/28/2012   Microscopic hematuria 01/28/2012  Sebaceous hyperplasia 10/24/2011   BACK PAIN, THORACIC REGION 07/25/2009   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/19/2006   OBESITY, NOS 05/19/2006   Major depressive disorder, recurrent episode 05/19/2006   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   PCP: Nani Gasser, MD REFERRING PROVIDER: Rodney Langton, MD REFERRING DIAG: [phone] (ICD-10-CM) - Bilateral hip pain  THERAPY DIAG:  Pain in right hip  Pain in left hip  Muscle weakness (generalized)  Rationale for Evaluation and Treatment Rehabilitation  ONSET DATE: 05/19/22  SUBJECTIVE:  SUBJECTIVE STATEMENT: Patient reports she slept with her new pillow for the first time and her  hips felt better. Patient states she found her daughter's roller and has been using it on her lateral thighs, states she is able to tolerate more pressure along R lateral thigh.   PERTINENT HISTORY: Long standing h/o bilateral hip pain after childbirth, weight loss s/p bariatric surgery 3 years ago, [employer] with cardiac ablation PAIN:  Are you having pain? Yes: NPRS scale: 2-3, was a 4-5 over the weekend out of /10 Pain location: bilateral hip pain, pain into low back Pain description: sharp Aggravating factors: goes to an 8/10 when she wakes up at night and when first rising in the morning Relieving factors: pain meds , stretching helps *Had a hard time sleeping the past 2 nights PRECAUTIONS: None WEIGHT BEARING RESTRICTIONS No FALLS:  Has patient fallen in last 6 months? No PATIENT GOALS Strengthen hips to reduce pain  OBJECTIVE:  (Measures in this section from initial evaluation unless otherwise noted) PATIENT SURVEYS:  FOTO 62% (goal 67%)  MUSCLE LENGTH: Hamstrings: Right -15 deg; Left -22 deg Thomas test: Right WNLs ROM, but pain over greater trochanter  PALPATION: Bilateral pain along lateral aspect, IT Band, lateral SI joint Hypomobility noted with PA mobs in thoracic and lumbar spine-- general tightness  LOWER EXTREMITY ROM:   Lumbar A/ROM: Pain at end range flexion (in L5/S1 region) and sensation of "tight" Extension: painful with 50% reduced motion L SB= R side discomfort R SB= no pain WNLs  LOWER EXTREMITY MMT:  MMT Right eval Left eval Right 11/27/22 Left  11/27/22  Hip flexion 5/5 5/5    Hip extension 4/5 5/5    Hip abduction 3/5 With pain 4-/5 5/5 5/5  Hip adduction 3+/5 3+/5 3+/5 With pain 4-/5  Hip internal rotation 5/5 5/5    Hip external rotation 5/5 5/5    Knee flexion 5/5 5/5    Knee extension 5/5 5/5    Ankle dorsiflexion 4+/5 5/5    Ankle plantarflexion      Ankle inversion      Ankle eversion       (Blank rows = not  tested)  LOWER EXTREMITY SPECIAL TESTS:  Squat-- at wall, painful bilateral knees Standing heel raises can do bilateral, not unilateral  GAIT: Distance walked: 100 ft Comments: gait characterized by antalgic pattern after sitting   [facility] Adult PT Treatment:                                                DATE: 11/27/2022 Therapeutic Exercise: Recumbent bike L5 x 5 min Hip abd/add MMT Seated hip add isometric ball squeeze 5x5" LAQ + ball squeeze x10 (alt) STS with ball b/w knees x10 Thred the needle stretch x10 B Primal push-up 2x12", x15" Prone hip extension x10 B DKTC --> SKTC Wall squat  with ball squeeze (discontinued d/t knee pain on eccentric phase) Seated captain morgan 8x5" --> standing 5x5" B Thomas stretch x30" B   [facility] Adult PT Treatment:                                                DATE: 11/21/2022 Therapeutic Exercise: Recumbent bike warm-up L 5 x 5 min HS/ITB stretches w/strap DKTC --> SKTC S/L clamshells x15 S/L straight leg hip abd x10 Prone hip extension x10 B Primal plank 3x10" Thomas stretch 2x30" B Manual Therapy: STM for bilat IT bands Trigger Point Dry-Needling  Treatment instructions: Expect mild to moderate muscle soreness.  Patient Consent Given: Yes Education handout provided: Previously provided Muscles treated: bilat quad and HS at IT band insertion, distally Treatment response/outcome: palpable lengthening    PATIENT EDUCATION:  Education details: Progressing HEP Person educated: Patient Education method: Explanation, demo, handout Education comprehension: verbalized understanding and returned demonstration  HOME EXERCISE PROGRAM: Access Code: [email or web address] URL: [email or web address] Date: 11/18/2022 Prepared by: Carlynn Herald  Exercises - Prone Press Up  - 1 x daily - 7 x weekly - 1 sets - 10 reps - Cat Cow  - 1 x daily - 7 x weekly - 1 sets - 5-10 reps - Primal Push Up  - 1 x daily - 7 x weekly - 1 sets - 5 reps -  Standing Heel Raises  - 1 x daily - 7 x weekly - 1 sets - 10-15 reps - Supine Hip Adduction Isometric with Ball  - 1 x daily - 7 x weekly - 3 sets - 10 reps - Bird Dog  - 1 x daily - 7 x weekly - 3 sets - 10 reps - Glute Med Wall Lean  - 1 x daily - 7 x weekly - 3 sets - 10 reps - Supine Knees to Chest with Swiss Ball  - 1 x daily - 7 x weekly - 3 sets - 10 reps - Prone Hip Internal Rotation AROM  - 1 x daily - 7 x weekly - 3 sets - 10 reps - Beginner Double Leg Stretch  - 1 x daily - 7 x weekly - 3 sets - 10 reps - Standing Hip Flexion with Resistance  - 1 x daily - 2 x weekly - 1 sets - 5 reps - Standing Hip Adduction with Resistance  - 1 x daily - 2 x weekly - 1 sets - 5 reps - Standing Hip Extension with Resistance  - 1 x daily - 2 x weekly - 1 sets - 5 reps - Standing Hip Abduction with Resistance  - 1 x daily - 2 x weekly - 1 sets - 5 reps - Sidelying Hip Extension in Abduction  - 1 x daily - 7 x weekly - 3 sets - 10 reps  ASSESSMENT:  CLINICAL IMPRESSION: Significant improvement in bilateral hip abd strength as measured by MMT; patient continues to exhibit weakness in R adductor strength and reported pain with over pressure. Mild pain reported in R hip when transitioning from standing forward flexion to standing.   OBJECTIVE IMPAIRMENTS Abnormal gait, decreased activity tolerance, difficulty walking, decreased strength, hypomobility, increased fascial restrictions, impaired flexibility, and pain.   GOALS: Goals reviewed with patient? Yes  SHORT TERM GOALS: Target date: 11/05/22  The patient will be indep with HEP Baseline:modifying prior HEP Goal status: MET  2.  The patient will report pain < or equal to 2/10 Baseline: 3/10, varies up to 8/10 Goal status: IN PROGRESS  4.  The patient will improve R hip abductor strength to 4/5. Baseline: 3/5 Goal status: MET  LONG TERM GOALS: Target date: 12/03/22  The patient will be indep with HEP progression. Baseline: for d/c Goal  status: INITIAL  2.  The patient will improve FOTO to 67%. Baseline: 62% Goal status: IN PROGRESS (50% on 11/03/22)  3.  The patient will improve bilat hip abductor strength to 5/5. Baseline: see MMT chart Goal status: MET  4.  The patient will improve bilat hamstring length to -10 from full extension. Baseline: -22 / -15 Goal status: INITIAL  5.  The patient will tolerate forward trunk flexion without pain. Baseline: end range pain Goal status: IN PROGRESS (mild pain in R hip when coming up to standing)  6.  The patient will be indep with home walking program and community wellness. Baseline: Member at [employer] Goal status: MET   PLAN: PT FREQUENCY: 2x/week  PT DURATION: 8 weeks  PLANNED INTERVENTIONS: Therapeutic exercises, Therapeutic activity, Neuromuscular re-education, Balance training, Gait training, Patient/Family education, Self Care, Joint mobilization, Aquatic Therapy, Dry Needling, Electrical stimulation, Spinal mobilization, Moist heat, Taping, and Manual therapy  PLAN FOR NEXT SESSION: Progress LE strengthening, core stability, spinal mobility, and tightness. DN as needed.   Sanjuana Mae, PTA 11/27/2022, 9:28 AM

## 2022-11-28 ENCOUNTER — Other Ambulatory Visit: Payer: Self-pay | Admitting: Family Medicine

## 2022-11-28 DIAGNOSIS — M7918 Myalgia, other site: Secondary | ICD-10-CM

## 2022-12-01 ENCOUNTER — Encounter: Payer: Self-pay | Admitting: Rehabilitative and Restorative Service Providers"

## 2022-12-01 ENCOUNTER — Ambulatory Visit: Payer: Managed Care, Other (non HMO) | Admitting: Rehabilitative and Restorative Service Providers"

## 2022-12-01 DIAGNOSIS — M25551 Pain in right hip: Secondary | ICD-10-CM

## 2022-12-01 DIAGNOSIS — M6281 Muscle weakness (generalized): Secondary | ICD-10-CM

## 2022-12-01 DIAGNOSIS — M25552 Pain in left hip: Secondary | ICD-10-CM

## 2022-12-01 NOTE — Therapy (Signed)
OUTPATIENT PHYSICAL THERAPY LOWER EXTREMITY TREATMENT   Patient Name: Laura [name] MRN: [id number] DOB:Jun 30, 1971, 52 y.o., female Today's Date: 12/01/2022   PT End of Session - 12/01/22 1104     Visit Number 14    Number of Visits 16    Date for PT Re-Evaluation 12/07/22    Authorization Type cigna    PT Start Time 1105    PT Stop Time 1150    PT Time Calculation (min) 45 min    Activity Tolerance Patient tolerated treatment well    Behavior During Therapy WFL for tasks assessed/performed            Past Medical History:  Diagnosis Date   Allergic rhinitis    Arthritis    Depression    Dyslipidemia    Dysmenorrhea    Family history of adverse reaction to anesthesia    mother slow to wake up   Gall bladder disease    GERD (gastroesophageal reflux disease)    Headache    History of kidney stones    Infertility, female    Obesity    Pneumonia    Polycystic ovary disease    PONV (postoperative nausea and vomiting)    Sebaceous hyperplasia     on tretinoin   Sleep apnea    cpap   WPW (Wolff-Parkinson-White syndrome)    hx of ablation 2012   Past Surgical History:  Procedure Laterality Date   CESAREAN SECTION     x 2   gastric bypass surgery      INCISIONAL HERNIA REPAIR N/A 07/01/2021   Procedure: INCISIONAL HERNIA REPAIR WITH MESH;  Surgeon: Kinsinger, De Blanch, MD;  Location: [facility];  Service: General;  Laterality: N/A;   LAPAROSCOPIC CHOLECYSTECTOMY     left foot surgery      SLEEVE GASTROPLASTY     SUPRAVENTRICULAR TACHYCARDIA ABLATION N/A 07/23/2011   Procedure: SUPRAVENTRICULAR TACHYCARDIA ABLATION;  Surgeon: Marinus Maw, MD;  Location: [facility] CATH LAB;  Service: Cardiovascular;  Laterality: N/A;   transthoracic echcardiogram  09/15/2006   Patient Active Problem List   Diagnosis Date Noted   Irregular periods 04/30/2022   Right upper quadrant pain 08/26/2021   Nausea 08/26/2021   Incisional hernia 07/01/2021   H/O metal allergy 05/01/2020    Insomnia 05/01/2020   Iron deficiency 10/26/2019   Hx of gastric bypass 06/29/2019   Migraine 04/28/2019   Situational depression 04/28/2019   Family history of colon cancer 01/19/2019   Hx of adenomatous colonic polyps 01/19/2019   Pain and swelling of right knee 12/20/2018   Intractable right heel pain 12/20/2018   Psychological factors affecting morbid obesity 01/27/2018   Protein deficiency 12/03/2017   Bilateral hip pain 10/17/2016   Left ureteral stone 04/11/2016   Low back pain 09/05/2015   Uterine fibroid 08/15/2015   Menorrhagia with irregular cycle 08/14/2015   Radiculitis of left cervical region 08/09/2015   Post-traumatic osteoarthritis of left knee 02/15/2015   History of bariatric surgery 02/06/2015   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   GERD (gastroesophageal reflux disease) 06/19/2014   History of continuous positive airway pressure (CPAP) therapy at home 06/19/2014   OSA on CPAP 06/19/2014   Snoring 05/11/2014   Mixed hyperlipidemia 04/10/2014   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   Vitamin D deficiency 01/11/2014   Heart palpitations 12/07/2013   Menopausal symptom 05/18/2012   Leg swelling 05/18/2012   History of kidney stones 01/28/2012   Left flank pain 01/28/2012   Microscopic hematuria 01/28/2012   Sebaceous  hyperplasia 10/24/2011   BACK PAIN, THORACIC REGION 07/25/2009   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/19/2006   OBESITY, NOS 05/19/2006   Major depressive disorder, recurrent episode 05/19/2006   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   PCP: Nani Gasser, MD REFERRING PROVIDER: Rodney Langton, MD REFERRING DIAG: [phone] (ICD-10-CM) - Bilateral hip pain THERAPY DIAG:  Pain in right hip  Pain in left hip  Muscle weakness (generalized)  Rationale for Evaluation and Treatment Rehabilitation  ONSET DATE: 05/19/22  SUBJECTIVE:  SUBJECTIVE STATEMENT: Patient reports she has increased pain this week. She had shooting pain when  standing and sitting at rest is painful. She describes it as a deep aching that she feels in her bones. She describes a sensation of "the bone is going to go through the hip". She also feels weakness is worse.  PERTINENT HISTORY: Long standing h/o bilateral hip pain after childbirth, weight loss s/p bariatric surgery 3 years ago, [employer] with cardiac ablation PAIN:  Are you having pain? Yes: NPRS scale:  4 /10 Pain location: bilateral hip pain, pain into low back Pain description: sharp Aggravating factors: goes to an 8/10 when she wakes up at night and when first rising in the morning Relieving factors: pain meds , stretching helps *4/10 at rest-- now hurting with sitting PRECAUTIONS: None WEIGHT BEARING RESTRICTIONS No FALLS:  Has patient fallen in last 6 months? No PATIENT GOALS Strengthen hips to reduce pain  OBJECTIVE:  (Measures in this section from initial evaluation unless otherwise noted) PATIENT SURVEYS:  FOTO 62% (goal 67%) MUSCLE LENGTH: Hamstrings: Right -15 deg; Left -22 deg Thomas test: Right WNLs ROM, but pain over greater trochanter PALPATION: Bilateral pain along lateral aspect, IT Band, lateral SI joint Hypomobility noted with PA mobs in thoracic and lumbar spine-- general tightness LOWER EXTREMITY ROM:   Lumbar A/ROM: Pain at end range flexion (in L5/S1 region) and sensation of "tight" Extension: painful with 50% reduced motion L SB= R side discomfort R SB= no pain WNLs LOWER EXTREMITY MMT:  MMT Right eval Left eval Right 11/27/22 Left  11/27/22  Hip flexion 5/5 5/5    Hip extension 4/5 5/5    Hip abduction 3/5 With pain 4-/5 5/5 5/5  Hip adduction 3+/5 3+/5 3+/5 With pain 4-/5  Hip internal rotation 5/5 5/5    Hip external rotation 5/5 5/5    Knee flexion 5/5 5/5    Knee extension 5/5 5/5    Ankle dorsiflexion 4+/5 5/5    Ankle plantarflexion      Ankle inversion      Ankle eversion       (Blank rows = not tested) LOWER EXTREMITY  SPECIAL TESTS:  Squat-- at wall, painful bilateral knees Standing heel raises can do bilateral, not unilateral GAIT: Distance walked: 100 ft Comments: gait characterized by antalgic pattern after sitting   [facility] Adult PT Treatment:                                                DATE: 12/01/22 Therapeutic Exercise: Supine Single and double knee to chest (notes some groin pain R side) Butterfly stretch R and L in hooklying Wide leg lumbar rotation Overpressure into rotation using one LE to deepen rotation stretch SLR 10 reps L and 8 reps R-- fatigues quickly today Strap stretch HS, hip adductor and lateral thigh  Prone Quad stretch R  and L x 30 second holds Press up with one leg off edge of mat Passive windshield wipers with deep tissue pressure Quadriped Thread the needle 5 reps R and L sides Primal push up x 12 +seconds x 3 reps Manual Therapy: STM R gluteus maximus, piriformis and lateral hip rotators   [facility] Adult PT Treatment:                                                DATE: 11/27/2022 Therapeutic Exercise: Recumbent bike L5 x 5 min Hip abd/add MMT Seated hip add isometric ball squeeze 5x5" LAQ + ball squeeze x10 (alt) STS with ball b/w knees x10 Thred the needle stretch x10 B Primal push-up 2x12", x15" Prone hip extension x10 B DKTC --> SKTC Wall squat with ball squeeze (discontinued d/t knee pain on eccentric phase) Seated captain morgan 8x5" --> standing 5x5" B Thomas stretch x30" B  [facility] Adult PT Treatment:                                                DATE: 11/21/2022 Therapeutic Exercise: Recumbent bike warm-up L 5 x 5 min HS/ITB stretches w/strap DKTC --> SKTC S/L clamshells x15 S/L straight leg hip abd x10 Prone hip extension x10 B Primal plank 3x10" Thomas stretch 2x30" B Manual Therapy: STM for bilat IT bands Trigger Point Dry-Needling  Treatment instructions: Expect mild to moderate muscle soreness.  Patient Consent Given: Yes Education handout  provided: Previously provided Muscles treated: bilat quad and HS at IT band insertion, distally Treatment response/outcome: palpable lengthening  PATIENT EDUCATION:  Education details: Progressing HEP Person educated: Patient Education method: Explanation, demo, handout Education comprehension: verbalized understanding and returned demonstration  HOME EXERCISE PROGRAM: Access Code: [email or web address] URL: [email or web address] Date: 11/18/2022 Prepared by: Carlynn Herald  Exercises - Prone Press Up  - 1 x daily - 7 x weekly - 1 sets - 10 reps - Cat Cow  - 1 x daily - 7 x weekly - 1 sets - 5-10 reps - Primal Push Up  - 1 x daily - 7 x weekly - 1 sets - 5 reps - Standing Heel Raises  - 1 x daily - 7 x weekly - 1 sets - 10-15 reps - Supine Hip Adduction Isometric with Ball  - 1 x daily - 7 x weekly - 3 sets - 10 reps - Bird Dog  - 1 x daily - 7 x weekly - 3 sets - 10 reps - Glute Med Wall Lean  - 1 x daily - 7 x weekly - 3 sets - 10 reps - Supine Knees to Chest with Swiss Ball  - 1 x daily - 7 x weekly - 3 sets - 10 reps - Prone Hip Internal Rotation AROM  - 1 x daily - 7 x weekly - 3 sets - 10 reps - Beginner Double Leg Stretch  - 1 x daily - 7 x weekly - 3 sets - 10 reps - Standing Hip Flexion with Resistance  - 1 x daily - 2 x weekly - 1 sets - 5 reps - Standing Hip Adduction with Resistance  - 1 x daily - 2 x weekly - 1 sets - 5 reps - Standing Hip Extension  with Resistance  - 1 x daily - 2 x weekly - 1 sets - 5 reps - Standing Hip Abduction with Resistance  - 1 x daily - 2 x weekly - 1 sets - 5 reps - Sidelying Hip Extension in Abduction  - 1 x daily - 7 x weekly - 3 sets - 10 reps  ASSESSMENT:  CLINICAL IMPRESSION: Patient with worsening pain today. She is progressing with strengthening, but pain worse this week. PT and patient discussed changes in activity and patient is walking more at work. Plan to continue to progress and check goals next visit.  OBJECTIVE IMPAIRMENTS  Abnormal gait, decreased activity tolerance, difficulty walking, decreased strength, hypomobility, increased fascial restrictions, impaired flexibility, and pain.   GOALS: Goals reviewed with patient? Yes  SHORT TERM GOALS: Target date: 11/05/22  The patient will be indep with HEP Baseline:modifying prior HEP Goal status: MET  2.  The patient will report pain < or equal to 2/10 Baseline: 3/10, varies up to 8/10 Goal status: partially MET  4.  The patient will improve R hip abductor strength to 4/5. Baseline: 3/5 Goal status: MET  LONG TERM GOALS: Target date: 12/03/22  The patient will be indep with HEP progression. Baseline: for d/c Goal status: INITIAL  2.  The patient will improve FOTO to 67%. Baseline: 62% Goal status: IN PROGRESS (50% on 11/03/22)  3.  The patient will improve bilat hip abductor strength to 5/5. Baseline: see MMT chart Goal status: MET  4.  The patient will improve bilat hamstring length to -10 from full extension. Baseline: -22 / -15 Goal status: INITIAL  5.  The patient will tolerate forward trunk flexion without pain. Baseline: end range pain Goal status: IN PROGRESS (mild pain in R hip when coming up to standing)  6.  The patient will be indep with home walking program and community wellness. Baseline: Member at [employer] Goal status: MET   PLAN: PT FREQUENCY: 2x/week  PT DURATION: 8 weeks  PLANNED INTERVENTIONS: Therapeutic exercises, Therapeutic activity, Neuromuscular re-education, Balance training, Gait training, Patient/Family education, Self Care, Joint mobilization, Aquatic Therapy, Dry Needling, Electrical stimulation, Spinal mobilization, Moist heat, Taping, and Manual therapy  PLAN FOR NEXT SESSION: Progress LE strengthening, core stability, spinal mobility, and tightness. DN as needed. Check goals and renew?    Haydn Hutsell, PT 12/01/2022, 11:05 AM

## 2022-12-03 ENCOUNTER — Ambulatory Visit: Payer: Managed Care, Other (non HMO) | Attending: Medical-Surgical

## 2022-12-03 ENCOUNTER — Encounter: Payer: Self-pay | Admitting: Medical-Surgical

## 2022-12-03 ENCOUNTER — Ambulatory Visit (INDEPENDENT_AMBULATORY_CARE_PROVIDER_SITE_OTHER): Payer: Managed Care, Other (non HMO) | Admitting: Medical-Surgical

## 2022-12-03 VITALS — BP 114/70 | HR 67 | Resp 20 | Ht 64.0 in | Wt 213.1 lb

## 2022-12-03 DIAGNOSIS — R002 Palpitations: Secondary | ICD-10-CM

## 2022-12-03 NOTE — Progress Notes (Unsigned)
Enrolled patient for a 14 day Zio XT monitor to be mailed to patients home  DOD to read 

## 2022-12-03 NOTE — Progress Notes (Signed)
        Established patient visit  History, exam, impression, and plan:  1. Palpitations Very pleasant 52 year old female with a history of Wolff-Parkinson-White syndrome status post ablation in 2012 presenting today with complaints of new onset continuous palpitations that started approximately 3-4 days ago.  Feels that the palpitations are frequent and are often accompanied by pressure in her head and a catch in her breathing.  Denies CP, SOB, LE edema, and syncopal episodes.  Endorses dizziness that has been going on for a while and does not seem to be associated with the palpitations.  On exam, heart rate irregular, bradycardic down to 51.  Alert, oriented x 3.  Gait steady, well-balanced.  In office EKG showing sinus bradycardia with frequent PVCs, normal axis, rate 59.  Checking labs as below.  Ordering a long-term monitor for further evaluation.  She has a cardiologist and has been able to set up an appointment with them tomorrow.  Encouraged her to keep this appointment for further evaluation.  Continue to monitor symptoms and if they worsen or are accompanied by chest pain, shortness of breath, nausea, vomiting, diaphoresis, etc. she should seek emergency care. - EKG 12-Lead - LONG TERM MONITOR (3-14 DAYS); Future - CBC with Differential/Platelet - COMPLETE METABOLIC PANEL WITH GFR - TSH - Magnesium - Troponin I   Procedures performed this visit: None.  Return if symptoms worsen or fail to improve.  __________________________________ Thayer Ohm, DNP, APRN, FNP-BC Primary Care and Sports Medicine [facility] [place]

## 2022-12-05 ENCOUNTER — Ambulatory Visit: Payer: Managed Care, Other (non HMO) | Admitting: Rehabilitative and Restorative Service Providers"

## 2022-12-05 ENCOUNTER — Encounter: Payer: Self-pay | Admitting: Rehabilitative and Restorative Service Providers"

## 2022-12-05 DIAGNOSIS — M25551 Pain in right hip: Secondary | ICD-10-CM | POA: Diagnosis not present

## 2022-12-05 DIAGNOSIS — M25552 Pain in left hip: Secondary | ICD-10-CM

## 2022-12-05 DIAGNOSIS — M6281 Muscle weakness (generalized): Secondary | ICD-10-CM

## 2022-12-05 LAB — COMPLETE METABOLIC PANEL WITH GFR
AG Ratio: 1.7 (calc) (ref 1.0–2.5)
ALT: 27 U/L (ref 6–29)
AST: 23 U/L (ref 10–35)
Albumin: 3.6 g/dL (ref 3.6–5.1)
Alkaline phosphatase (APISO): 99 U/L (ref 37–153)
BUN: 12 mg/dL (ref 7–25)
CO2: 30 mmol/L (ref 20–32)
Calcium: 8.7 mg/dL (ref 8.6–10.4)
Chloride: 108 mmol/L (ref 98–110)
Creat: 0.65 mg/dL (ref 0.50–1.03)
Globulin: 2.1 g/dL (calc) (ref 1.9–3.7)
Glucose, Bld: 87 mg/dL (ref 65–99)
Potassium: 4.7 mmol/L (ref 3.5–5.3)
Sodium: 144 mmol/L (ref 135–146)
Total Bilirubin: 0.4 mg/dL (ref 0.2–1.2)
Total Protein: 5.7 g/dL — ABNORMAL LOW (ref 6.1–8.1)
eGFR: 106 mL/min/{1.73_m2} (ref >=60)

## 2022-12-05 LAB — CBC WITH DIFFERENTIAL/PLATELET
Absolute Monocytes: 380 cells/uL (ref 200–950)
Basophils Absolute: 21 cells/uL (ref 0–200)
Basophils Relative: 0.3 %
Eosinophils Absolute: 90 cells/uL (ref 15–500)
Eosinophils Relative: 1.3 %
HCT: 41.7 % (ref 35.0–45.0)
Hemoglobin: 13.8 g/dL (ref 11.7–15.5)
Lymphs Abs: 2270 cells/uL (ref 850–3900)
MCH: 31.9 pg (ref 27.0–33.0)
MCHC: 33.1 g/dL (ref 32.0–36.0)
MCV: 96.3 fL (ref 80.0–100.0)
MPV: 10.2 fL (ref 7.5–12.5)
Monocytes Relative: 5.5 %
Neutro Abs: 4140 cells/uL (ref 1500–7800)
Neutrophils Relative %: 60 %
Platelets: 190 10*3/uL (ref 140–400)
RBC: 4.33 10*6/uL (ref 3.80–5.10)
RDW: 11.4 % (ref 11.0–15.0)
Total Lymphocyte: 32.9 %
WBC: 6.9 10*3/uL (ref 3.8–10.8)

## 2022-12-05 LAB — T4, FREE: Free T4: 0.9 ng/dL (ref 0.8–1.8)

## 2022-12-05 LAB — TROPONIN I: Troponin I: <3 ng/L (ref <=47)

## 2022-12-05 LAB — TSH: TSH: 4.73 mIU/L — ABNORMAL HIGH

## 2022-12-05 LAB — MAGNESIUM: Magnesium: 2 mg/dL (ref 1.5–2.5)

## 2022-12-05 NOTE — Therapy (Signed)
OUTPATIENT PHYSICAL THERAPY LOWER EXTREMITY TREATMENT and DISCHARGE SUMMARY  Patient Name: Laura [name] MRN: [id number] DOB:16-Jul-1971, 52 y.o., female Today's Date: 12/05/2022   PHYSICAL THERAPY DISCHARGE SUMMARY  Visits from Start of Care: 15  Current functional level related to goals / functional outcomes: See goals below.   Remaining deficits: Low grade pain today, able to follow through with the HEP.    Education / Equipment: HEP   Patient agrees to discharge. Patient goals were met. Patient is being discharged due to meeting the stated rehab goals.   PT End of Session - 12/05/22 1103     Visit Number 15    Number of Visits 16    Date for PT Re-Evaluation 12/07/22    Authorization Type cigna    PT Start Time 1104    PT Stop Time 1145    PT Time Calculation (min) 41 min    Activity Tolerance Patient tolerated treatment well    Behavior During Therapy WFL for tasks assessed/performed            Past Medical History:  Diagnosis Date   Allergic rhinitis    Arthritis    Depression    Dyslipidemia    Dysmenorrhea    Family history of adverse reaction to anesthesia    mother slow to wake up   Gall bladder disease    GERD (gastroesophageal reflux disease)    Headache    History of kidney stones    Infertility, female    Obesity    Pneumonia    Polycystic ovary disease    PONV (postoperative nausea and vomiting)    Sebaceous hyperplasia     on tretinoin   Sleep apnea    cpap   WPW (Wolff-Parkinson-White syndrome)    hx of ablation 2012   Past Surgical History:  Procedure Laterality Date   CESAREAN SECTION     x 2   gastric bypass surgery      INCISIONAL HERNIA REPAIR N/A 07/01/2021   Procedure: INCISIONAL HERNIA REPAIR WITH MESH;  Surgeon: Kinsinger, De Blanch, MD;  Location: [facility];  Service: General;  Laterality: N/A;   LAPAROSCOPIC CHOLECYSTECTOMY     left foot surgery      SLEEVE GASTROPLASTY     SUPRAVENTRICULAR TACHYCARDIA ABLATION N/A  07/23/2011   Procedure: SUPRAVENTRICULAR TACHYCARDIA ABLATION;  Surgeon: Marinus Maw, MD;  Location: [facility] CATH LAB;  Service: Cardiovascular;  Laterality: N/A;   transthoracic echcardiogram  09/15/2006   Patient Active Problem List   Diagnosis Date Noted   Irregular periods 04/30/2022   Right upper quadrant pain 08/26/2021   Nausea 08/26/2021   Incisional hernia 07/01/2021   H/O metal allergy 05/01/2020   Insomnia 05/01/2020   Iron deficiency 10/26/2019   Hx of gastric bypass 06/29/2019   Migraine 04/28/2019   Situational depression 04/28/2019   Family history of colon cancer 01/19/2019   Hx of adenomatous colonic polyps 01/19/2019   Pain and swelling of right knee 12/20/2018   Intractable right heel pain 12/20/2018   Psychological factors affecting morbid obesity (HCC) 01/27/2018   Protein deficiency (HCC) 12/03/2017   Bilateral hip pain 10/17/2016   Left ureteral stone 04/11/2016   Low back pain 09/05/2015   Uterine fibroid 08/15/2015   Menorrhagia with irregular cycle 08/14/2015   Radiculitis of left cervical region 08/09/2015   Post-traumatic osteoarthritis of left knee 02/15/2015   History of bariatric surgery 02/06/2015   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   GERD (gastroesophageal reflux disease) 06/19/2014  History of continuous positive airway pressure (CPAP) therapy at home 06/19/2014   OSA on CPAP 06/19/2014   Snoring 05/11/2014   Mixed hyperlipidemia 04/10/2014   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   Vitamin D deficiency 01/11/2014   Heart palpitations 12/07/2013   Menopausal symptom 05/18/2012   Leg swelling 05/18/2012   History of kidney stones 01/28/2012   Left flank pain 01/28/2012   Microscopic hematuria 01/28/2012   Sebaceous hyperplasia 10/24/2011   BACK PAIN, THORACIC REGION 07/25/2009   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/19/2006   OBESITY, NOS 05/19/2006   Major depressive disorder, recurrent episode (HCC) 05/19/2006   WOLFF (WOLFE)-PARKINSON-WHITE (WPW) SYNDROME  05/19/2006   PCP: Nani Gasser, MD REFERRING PROVIDER: Rodney Langton, MD REFERRING DIAG: [phone] (ICD-10-CM) - Bilateral hip pain THERAPY DIAG:  Pain in right hip  Pain in left hip  Muscle weakness (generalized)  Rationale for Evaluation and Treatment Rehabilitation  ONSET DATE: 05/19/22  SUBJECTIVE:  SUBJECTIVE STATEMENT: The patient reports that pain from earlier this week has subsided. She describes more tightness today rated a 1/10 PERTINENT HISTORY: Long standing h/o bilateral hip pain after childbirth, weight loss s/p bariatric surgery 3 years ago, [employer] with cardiac ablation PAIN:  Are you having pain? Yes: NPRS scale:  4 /10 Pain location: bilateral hip pain, pain into low back Pain description: sharp Aggravating factors: goes to an 8/10 when she wakes up at night and when first rising in the morning Relieving factors: pain meds , stretching helps *4/10 at rest-- now hurting with sitting PRECAUTIONS: None WEIGHT BEARING RESTRICTIONS No FALLS:  Has patient fallen in last 6 months? No PATIENT GOALS Strengthen hips to reduce pain  OBJECTIVE:  (Measures in this section from initial evaluation unless otherwise noted) PATIENT SURVEYS:  FOTO 62% (goal 67%) MUSCLE LENGTH: Hamstrings: Right -15 deg; Left -22 deg Thomas test: Right WNLs ROM, but pain over greater trochanter PALPATION: Bilateral pain along lateral aspect, IT Band, lateral SI joint Hypomobility noted with PA mobs in thoracic and lumbar spine-- general tightness LOWER EXTREMITY ROM:   Lumbar A/ROM: Pain at end range flexion (in L5/S1 region) and sensation of "tight" Extension: painful with 50% reduced motion L SB= R side discomfort R SB= no pain WNLs LOWER EXTREMITY MMT: MMT Right eval Left eval Right 11/27/22 Left  11/27/22  Hip flexion 5/5 5/5    Hip extension 4/5 5/5    Hip abduction 3/5 With pain 4-/5 5/5 5/5  Hip adduction 3+/5 3+/5 3+/5 With pain 4-/5   Hip internal rotation 5/5 5/5    Hip external rotation 5/5 5/5    Knee flexion 5/5 5/5    Knee extension 5/5 5/5    Ankle dorsiflexion 4+/5 5/5    Ankle plantarflexion      Ankle inversion      Ankle eversion       (Blank rows = not tested) LOWER EXTREMITY SPECIAL TESTS:  Squat-- at wall, painful bilateral knees Standing heel raises can do bilateral, not unilateral GAIT: Distance walked: 100 ft Comments: gait characterized by antalgic pattern after sitting   [facility] Adult PT Treatment:                                                DATE: 12/05/22 Therapeutic Exercise: Quadriped Thread the needle x 6 reps R and L sides Primal push up x 3 reps x 15 second holds  Standing Glut medius wall press Supine Hamstring length/ stretching Hip adductor stretch Lateral hip stretch with strap Prone Windshield wipers Self Care: Discussed and divided HEP into 2 groups for ease at home. Discussed continued strengthening.  [facility] Adult PT Treatment:                                                DATE: 12/01/22 Therapeutic Exercise: Supine Single and double knee to chest (notes some groin pain R side) Butterfly stretch R and L in hooklying Wide leg lumbar rotation Overpressure into rotation using one LE to deepen rotation stretch SLR 10 reps L and 8 reps R-- fatigues quickly today Strap stretch HS, hip adductor and lateral thigh  Prone Quad stretch R and L x 30 second holds Press up with one leg off edge of mat Passive windshield wipers with deep tissue pressure Quadriped Thread the needle 5 reps R and L sides Primal push up x 12 +seconds x 3 reps Manual Therapy: STM R gluteus maximus, piriformis and lateral hip rotators   PATIENT EDUCATION:  Education details: Progressing HEP Person educated: Patient Education method: Explanation, demo, handout Education comprehension: verbalized understanding and returned demonstration  HOME EXERCISE PROGRAM: Access Code: [email or web address] URL:  [email or web address] Date: 12/05/2022 Prepared by: Margretta Ditty  Exercises - Prone Press Up  - 1 x daily - 7 x weekly - 1 sets - 10 reps - Sidelying Hip Extension in Abduction  - 1 x daily - 7 x weekly - 3 sets - 10 reps - Bird Dog  - 1 x daily - 7 x weekly - 3 sets - 10 reps - Supine Hip Adduction Isometric with Ball  - 1 x daily - 7 x weekly - 3 sets - 10 reps - Supine Knees to Chest with Swiss Ball  - 1 x daily - 7 x weekly - 3 sets - 10 reps - Prone Hip Internal Rotation AROM  - 1 x daily - 7 x weekly - 3 sets - 10 reps - Beginner Double Leg Stretch  - 1 x daily - 7 x weekly - 3 sets - 10 reps - Cat Cow  - 1 x daily - 7 x weekly - 1 sets - 5-10 reps - Primal Push Up  - 1 x daily - 7 x weekly - 1 sets - 5 reps - Glute Med Wall Lean  - 1 x daily - 7 x weekly - 3 sets - 10 reps - Standing Hip Flexion with Resistance  - 1 x daily - 2 x weekly - 1 sets - 5 reps - Standing Hip Adduction with Resistance  - 1 x daily - 2 x weekly - 1 sets - 5 reps - Standing Hip Extension with Resistance  - 1 x daily - 2 x weekly - 1 sets - 5 reps - Standing Hip Abduction with Resistance  - 1 x daily - 2 x weekly - 1 sets - 5 reps ASSESSMENT:  CLINICAL IMPRESSION: The patient has partially met all LTGs. PT encouraged continuation of HEP and continued strengthening.   OBJECTIVE IMPAIRMENTS Abnormal gait, decreased activity tolerance, difficulty walking, decreased strength, hypomobility, increased fascial restrictions, impaired flexibility, and pain.   GOALS: Goals reviewed with patient? Yes  SHORT TERM GOALS: Target date: 11/05/22  The patient will be indep with HEP Baseline:modifying prior HEP Goal status: MET  2.  The patient will report pain < or equal to 2/10 Baseline: 3/10, varies up to 8/10 Goal status: partially MET  4.  The patient will improve R hip abductor strength to 4/5. Baseline: 3/5 Goal status: MET  LONG TERM GOALS: Target date: 12/03/22  The patient will be  indep with HEP progression. Baseline: for d/c Goal status: MET   2.  The patient will improve FOTO to 67%. Baseline: 62% Goal status: PARTIALLY MET65% on 12/05/22  3.  The patient will improve bilat hip abductor strength to 5/5. Baseline: see MMT chart Goal status: MET  4.  The patient will improve bilat hamstring length to -10 from full extension. Baseline: R=-5 and L =-10 Goal status: MET   5.  The patient will tolerate forward trunk flexion without pain. Baseline: end range pain Goal status: MET   6.  The patient will be indep with home walking program and community wellness. Baseline: Member at [employer] Goal status: MET  PLAN: PT FREQUENCY: 2x/week  PT DURATION: 8 weeks  PLANNED INTERVENTIONS: Therapeutic exercises, Therapeutic activity, Neuromuscular re-education, Balance training, Gait training, Patient/Family education, Self Care, Joint mobilization, Aquatic Therapy, Dry Needling, Electrical stimulation, Spinal mobilization, Moist heat, Taping, and Manual therapy  PLAN FOR NEXT SESSION: Renewed today for discharge visit.    Jasamine Pottinger, PT 12/05/2022, 12:06 PM

## 2022-12-16 ENCOUNTER — Encounter: Payer: Self-pay | Admitting: Family Medicine

## 2022-12-16 ENCOUNTER — Ambulatory Visit (INDEPENDENT_AMBULATORY_CARE_PROVIDER_SITE_OTHER): Payer: Managed Care, Other (non HMO) | Admitting: Family Medicine

## 2022-12-16 VITALS — BP 114/48 | HR 60 | Ht 64.0 in | Wt 211.0 lb

## 2022-12-16 DIAGNOSIS — M25551 Pain in right hip: Secondary | ICD-10-CM | POA: Diagnosis not present

## 2022-12-16 DIAGNOSIS — M7918 Myalgia, other site: Secondary | ICD-10-CM | POA: Diagnosis not present

## 2022-12-16 DIAGNOSIS — F4321 Adjustment disorder with depressed mood: Secondary | ICD-10-CM

## 2022-12-16 DIAGNOSIS — M25552 Pain in left hip: Secondary | ICD-10-CM

## 2022-12-16 MED ORDER — TRAMADOL HCL 50 MG PO TABS
50.0000 mg | ORAL_TABLET | Freq: Three times a day (TID) | ORAL | 0 refills | Status: DC | PRN
Start: 2022-12-16 — End: 2023-03-23

## 2022-12-16 NOTE — Assessment & Plan Note (Signed)
She did complete formal physical therapy and is planning on getting back in with Dr. Benjamin Stain since she is still having persistent pain even though she has gained some strength.  I did go ahead and refill the tramadol today.  Will try increasing the tramadol to 60 mg to see if this can also help with some of her more chronic musculoskeletal pain.  And try to taper down the Lexapro.

## 2022-12-16 NOTE — Assessment & Plan Note (Signed)
Will bump up Cymbalta for improved musculoskeletal pain relief as well as for mood and so we will decrease Lexapro down to 5 mg if she is doing well in a couple of weeks then we can hopefully taper off the Lexapro.  But we can always return back to it if she feels like the Lexapro was more helpful.

## 2022-12-16 NOTE — Progress Notes (Signed)
Established Patient Office Visit  Subjective   Patient ID: Laura [name], female    DOB: 1971-04-10  Age: 52 y.o. MRN: [id number]  Chief Complaint  Patient presents with   Follow-up         HPI f/u new start Cymbalta for chronic MSK pain. She hasn't had any SE but doesn't feel it is helping her pain.  Kept her on the Lexapro 10 mg.  But she is just not convinced it is really made much of a difference.  Lateral hip pain-she would like a refill on the tramadol to use as needed.  Also unfortunately since I last saw her she started experiencing persistent palpitations and ended up seeing one of my partners right before she traveled out of town she was able to get back in with cardiology the next day.  She says eventually it stopped.  It was felt like one of her Wolff-Parkinson-White flares for which she is already had an ablation but yet she stayed in it for days.  She is currently wearing a heart monitor and so far has felt okay.  Has not had any recurrence of symptoms.    ROS    Objective:     BP (!) 114/48   Pulse 60   Ht 5\' 4"  (1.626 m)   Wt 211 lb (95.7 kg)   LMP 02/12/2019   SpO2 96%   BMI 36.22 kg/m    Physical Exam Vitals and nursing note reviewed.  Constitutional:      Appearance: She is well-developed.  HENT:     Head: Normocephalic and atraumatic.  Cardiovascular:     Rate and Rhythm: Normal rate and regular rhythm.     Heart sounds: Normal heart sounds.  Pulmonary:     Effort: Pulmonary effort is normal.     Breath sounds: Normal breath sounds.  Skin:    General: Skin is warm and dry.  Neurological:     Mental Status: She is alert and oriented to person, place, and time.  Psychiatric:        Behavior: Behavior normal.     No results found for any visits on 12/16/22.    The 10-year ASCVD risk score (Arnett DK, et al., 2019) is: 1%    Assessment & Plan:   Problem List Items Addressed This Visit       Other   Situational depression     Will bump up Cymbalta for improved musculoskeletal pain relief as well as for mood and so we will decrease Lexapro down to 5 mg if she is doing well in a couple of weeks then we can hopefully taper off the Lexapro.  But we can always return back to it if she feels like the Lexapro was more helpful.      Bilateral hip pain    She did complete formal physical therapy and is planning on getting back in with Dr. Benjamin Stain since she is still having persistent pain even though she has gained some strength.  I did go ahead and refill the tramadol today.  Will try increasing the tramadol to 60 mg to see if this can also help with some of her more chronic musculoskeletal pain.  And try to taper down the Lexapro.      Relevant Medications   traMADol (ULTRAM) 50 MG tablet   Other Visit Diagnoses     Musculoskeletal pain    -  Primary      I spent 25 minutes on the day  of the encounter to include pre-visit record review, face-to-face time with the patient and post visit ordering of test.   Return in about 6 weeks (around 01/27/2023) for change in Cymbalta dose.    Nani Gasser, MD

## 2022-12-16 NOTE — Patient Instructions (Addendum)
Please cut the lexapro in half and take 5mg  daily  Ok to increase Cymbalta to 60mg  total dialy.   Let'tr try for a month and see what you think

## 2022-12-18 ENCOUNTER — Other Ambulatory Visit: Payer: Self-pay | Admitting: *Deleted

## 2022-12-18 DIAGNOSIS — G43809 Other migraine, not intractable, without status migrainosus: Secondary | ICD-10-CM

## 2022-12-18 MED ORDER — TOPIRAMATE 100 MG PO TABS
100.0000 mg | ORAL_TABLET | Freq: Two times a day (BID) | ORAL | 1 refills | Status: DC
Start: 2022-12-18 — End: 2023-06-22

## 2023-02-06 ENCOUNTER — Ambulatory Visit (INDEPENDENT_AMBULATORY_CARE_PROVIDER_SITE_OTHER): Payer: Managed Care, Other (non HMO) | Admitting: Family Medicine

## 2023-02-06 ENCOUNTER — Encounter: Payer: Self-pay | Admitting: Family Medicine

## 2023-02-06 VITALS — BP 101/59 | HR 73 | Ht 64.0 in | Wt 212.0 lb

## 2023-02-06 DIAGNOSIS — R7989 Other specified abnormal findings of blood chemistry: Secondary | ICD-10-CM

## 2023-02-06 DIAGNOSIS — F4321 Adjustment disorder with depressed mood: Secondary | ICD-10-CM | POA: Diagnosis not present

## 2023-02-06 DIAGNOSIS — M7918 Myalgia, other site: Secondary | ICD-10-CM | POA: Diagnosis not present

## 2023-02-06 DIAGNOSIS — F330 Major depressive disorder, recurrent, mild: Secondary | ICD-10-CM

## 2023-02-06 DIAGNOSIS — M25551 Pain in right hip: Secondary | ICD-10-CM

## 2023-02-06 DIAGNOSIS — G4710 Hypersomnia, unspecified: Secondary | ICD-10-CM

## 2023-02-06 MED ORDER — DULOXETINE HCL 30 MG PO CPEP: 30.0000 mg | ORAL_CAPSULE | Freq: Every day | ORAL | 0 refills | Status: DC

## 2023-02-06 MED ORDER — ESCITALOPRAM OXALATE 10 MG PO TABS: 10.0000 mg | ORAL_TABLET | Freq: Every day | ORAL | 1 refills | Status: DC

## 2023-02-06 NOTE — Assessment & Plan Note (Signed)
Would prefer to continue with her 10 mg Lexapro which I think is perfectly fine.  We just will be able to go up on the Cymbalta which is also fine she is can to keep it for now and see if she can get back in for a right hip injection and if she does well with that then working to try to taper off the Cymbalta which I think could be contributing to some of her excess sleepiness that she is experiencing.

## 2023-02-06 NOTE — Progress Notes (Signed)
Established Patient Office Visit  Subjective   Patient ID: Laura [name], female    DOB: 02/17/1971  Age: 52 y.o. MRN: [id number]  Chief Complaint  Patient presents with   mood    HPI  Follow-up situational depression-she decided she really did not want to adjust her Lexapro she is been on it for years and is done really well with it was really nervous about decreasing it to compensate for the increase in the Cymbalta so she should stay on the 30 mg Cymbalta and then 10 mg Lexapro.  She does feel like it helps her pain and it has not been as severe as it was but it is still present it got really flared recently when she drove to [place] and was sitting in the car for prolonged ride.  She also feels like it has been making her little bit more tired and sleepy during the daytime.  She takes both of her medications at night.  Sleep just now having right trochanteric hip pain.  Her left is actually pretty well-controlled.     ROS    Objective:     BP (!) 101/59   Pulse 73   Ht 5\' 4"  (1.626 m)   Wt 212 lb (96.2 kg)   LMP 02/12/2019   SpO2 100%   BMI 36.39 kg/m    Physical Exam Vitals and nursing note reviewed.  Constitutional:      Appearance: She is well-developed.  HENT:     Head: Normocephalic and atraumatic.  Cardiovascular:     Rate and Rhythm: Normal rate and regular rhythm.     Heart sounds: Normal heart sounds.  Pulmonary:     Effort: Pulmonary effort is normal.     Breath sounds: Normal breath sounds.  Skin:    General: Skin is warm and dry.  Neurological:     Mental Status: She is alert and oriented to person, place, and time.  Psychiatric:        Behavior: Behavior normal.      No results found for any visits on 02/06/23.    The 10-year ASCVD risk score (Arnett DK, et al., 2019) is: 0.8%    Assessment & Plan:   Problem List Items Addressed This Visit       Other   Situational depression - Primary    Would prefer to continue with her 10  mg Lexapro which I think is perfectly fine.  We just will be able to go up on the Cymbalta which is also fine she is can to keep it for now and see if she can get back in for a right hip injection and if she does well with that then working to try to taper off the Cymbalta which I think could be contributing to some of her excess sleepiness that she is experiencing.      Relevant Medications   DULoxetine (CYMBALTA) 30 MG capsule   escitalopram (LEXAPRO) 10 MG tablet   Major depressive disorder, recurrent episode (HCC)   Relevant Medications   DULoxetine (CYMBALTA) 30 MG capsule   escitalopram (LEXAPRO) 10 MG tablet   Other Visit Diagnoses     Right hip pain       Musculoskeletal pain       Relevant Medications   DULoxetine (CYMBALTA) 30 MG capsule   Abnormal TSH       Relevant Orders   TSH + free T4   Excessive sleepiness  Right hip pain -encouraged her to get back in with Dr. Karie Schwalbe for another injection I think this could be helpful for pain relief and if she does well then we can taper off the Cymbalta which would be ideal.  TSH was just slightly abnormal with a free T4 back in April.  Plan to recheck next month and went ahead and put in the order so that she can go at her convenience since working to push out the follow-up for 6 months.  Return in about 6 months (around 08/08/2023) for Mood, please schedule appoint with Dr. Benjamin Stain for your right hip.   I spent 25 minutes on the day of the encounter to include pre-visit record review, face-to-face time with the patient and post visit ordering of test.   Nani Gasser, MD

## 2023-02-09 ENCOUNTER — Ambulatory Visit (INDEPENDENT_AMBULATORY_CARE_PROVIDER_SITE_OTHER): Payer: Managed Care, Other (non HMO) | Admitting: Sports Medicine

## 2023-02-09 ENCOUNTER — Other Ambulatory Visit (INDEPENDENT_AMBULATORY_CARE_PROVIDER_SITE_OTHER): Payer: Managed Care, Other (non HMO)

## 2023-02-09 ENCOUNTER — Other Ambulatory Visit: Payer: Self-pay

## 2023-02-09 DIAGNOSIS — M1611 Unilateral primary osteoarthritis, right hip: Secondary | ICD-10-CM

## 2023-02-09 DIAGNOSIS — M19011 Primary osteoarthritis, right shoulder: Secondary | ICD-10-CM

## 2023-02-09 NOTE — Assessment & Plan Note (Signed)
We have been managing Laura [name] sit for a long time now, she continues to have pain that she localizes laterally, she did have an MRI that showed some hip osteoarthritis. On exam with internal rotation of the hip it does reproduce her lateral pain radiating down the anterolateral thigh. I injected her hip joint today for the first time, previous injections have been at the trochanteric bursa. Return to see me as needed.

## 2023-02-09 NOTE — Progress Notes (Signed)
    Procedures performed today:    Procedure: Real-time Ultrasound Guided injection of the right acromioclavicular joint Device: Samsung HS60  Verbal informed consent obtained.  Time-out conducted.  Noted no overlying erythema, induration, or other signs of local infection.  Skin prepped in a sterile fashion.  Local anesthesia: Topical Ethyl chloride.  With sterile technique and under real time ultrasound guidance: Arthritic joint noted, 1/2 cc lidocaine, 1/2 cc kenalog 40 injected easily.  Completed without difficulty  Advised to call if fevers/chills, erythema, induration, drainage, or persistent bleeding.  Images permanently stored and available for review in PACS.  Impression: Technically successful ultrasound guided injection.  Procedure: Real-time Ultrasound Guided injection of the right hip joint Device: Samsung HS60  Verbal informed consent obtained.  Time-out conducted.  Noted no overlying erythema, induration, or other signs of local infection.  Skin prepped in a sterile fashion.  Local anesthesia: Topical Ethyl chloride.  With sterile technique and under real time ultrasound guidance: Arthritic hip joint noted, 1 cc Kenalog 40, 2 cc lidocaine, 2 cc bupivacaine injected easily Completed without difficulty  Advised to call if fevers/chills, erythema, induration, drainage, or persistent bleeding.  Images permanently stored and available for review in PACS.  Impression: Technically successful ultrasound guided injection.  Independent interpretation of notes and tests performed by another provider:   None.  Brief History, Exam, Impression, and Recommendations:    Arthritis of right acromioclavicular joint Laura [name] returns, she has increasing pain right shoulder localized over the acromioclavicular joint and reproduced with arm across chest. On exam she has tenderness at the [facility] joint, a positive crossarm sign and negative impingement signs. We injected her acromioclavicular  joint today, return to see me in 6 weeks as needed.  Primary osteoarthritis of right hip We have been managing Laura [name] sit for a long time now, she continues to have pain that she localizes laterally, she did have an MRI that showed some hip osteoarthritis. On exam with internal rotation of the hip it does reproduce her lateral pain radiating down the anterolateral thigh. I injected her hip joint today for the first time, previous injections have been at the trochanteric bursa. Return to see me as needed.    ____________________________________________ Ihor Austin. Benjamin Stain, M.D., ABFM., CAQSM., AME. Primary Care and Sports Medicine [facility]  Adjunct Professor of Family Medicine  [employer]  Restaurant manager, fast food

## 2023-02-09 NOTE — Assessment & Plan Note (Signed)
Laura [name] returns, she has increasing pain right shoulder localized over the acromioclavicular joint and reproduced with arm across chest. On exam she has tenderness at the [facility] joint, a positive crossarm sign and negative impingement signs. We injected her acromioclavicular joint today, return to see me in 6 weeks as needed.

## 2023-03-06 ENCOUNTER — Ambulatory Visit: Payer: Managed Care, Other (non HMO) | Admitting: Family Medicine

## 2023-03-23 ENCOUNTER — Ambulatory Visit (INDEPENDENT_AMBULATORY_CARE_PROVIDER_SITE_OTHER): Payer: Managed Care, Other (non HMO) | Admitting: Sports Medicine

## 2023-03-23 ENCOUNTER — Other Ambulatory Visit: Payer: Self-pay | Admitting: Family Medicine

## 2023-03-23 DIAGNOSIS — M1611 Unilateral primary osteoarthritis, right hip: Secondary | ICD-10-CM | POA: Diagnosis not present

## 2023-03-23 DIAGNOSIS — R7989 Other specified abnormal findings of blood chemistry: Secondary | ICD-10-CM

## 2023-03-23 DIAGNOSIS — K439 Ventral hernia without obstruction or gangrene: Secondary | ICD-10-CM

## 2023-03-23 DIAGNOSIS — M25551 Pain in right hip: Secondary | ICD-10-CM

## 2023-03-23 DIAGNOSIS — M25552 Pain in left hip: Secondary | ICD-10-CM | POA: Diagnosis not present

## 2023-03-23 MED ORDER — TRAMADOL HCL 50 MG PO TABS: 50.0000 mg | ORAL_TABLET | Freq: Two times a day (BID) | ORAL | 0 refills | Status: DC | PRN

## 2023-03-23 NOTE — Progress Notes (Signed)
    Procedures performed today:    None.  Independent interpretation of notes and tests performed by another provider:   None.  Brief History, Exam, Impression, and Recommendations:    Primary osteoarthritis of right hip Though better returns, she is a pleasant 52 year old female, she has chronic multifactorial bilateral right worse than left hip pain, she has had an MRI that did show some hip osteoarthritis, also showed fluid collections around the greater trochanter and hip abductor tendinopathy. She has had multiple greater trochanteric bursa type ultrasound-guided injections that provided me good relief, more recently we did a hip joint injection that provided near complete relief for a few days. This does suggest the hip joint is her primary pain generator. She does however localize the pain laterally. In addition she does have a lumbar spine MRI that shows multilevel lumbar spinal stenosis, but she only has minimal pain from a posterior aspect. She has done formal physical therapy, she has been on multiple medications. At this point I would like a consultation with Dr. Steward Drone for discussion of operative intervention. If Dr. Steward Drone suspects that intervention to the hip abductors is warranted we can certainly try a PRP type percutaneous tenotomy. Until then refilling tramadol for use up to twice daily.  Hernia of abdominal wall Laura [name] has had multiple abdominal interventions, she has had incisional hernias. More recently she noted a lump right lower abdomen, she localizes this approximately near the linea semilunaris. On exam I am unable to palpate any hernias, fullness. We tried having her stand, we tried having her Valsalva, do a crunch, nothing. I am going to order a CT abdomen and pelvis with oral and IV contrast for further evaluation and surgical planning. Warning signs and symptoms for incarceration/strangulation given.  I spent 30 minutes of total time managing this  patient today, this includes chart review, face to face, and non-face to face time.  ____________________________________________ Ihor Austin. Benjamin Stain, M.D., ABFM., CAQSM., AME. Primary Care and Sports Medicine [facility]  Adjunct Professor of Family Medicine  [place] [employer] [facility] [employer] [facility] [employer]  Restaurant manager, fast food

## 2023-03-23 NOTE — Progress Notes (Signed)
Orders Placed This Encounter  Procedures   TSH + free T4

## 2023-03-23 NOTE — Assessment & Plan Note (Signed)
Laura [name] has had multiple abdominal interventions, she has had incisional hernias. More recently she noted a lump right lower abdomen, she localizes this approximately near the linea semilunaris. On exam I am unable to palpate any hernias, fullness. We tried having her stand, we tried having her Valsalva, do a crunch, nothing. I am going to order a CT abdomen and pelvis with oral and IV contrast for further evaluation and surgical planning. Warning signs and symptoms for incarceration/strangulation given.

## 2023-03-23 NOTE — Assessment & Plan Note (Signed)
Though better returns, she is a pleasant 52 year old female, she has chronic multifactorial bilateral right worse than left hip pain, she has had an MRI that did show some hip osteoarthritis, also showed fluid collections around the greater trochanter and hip abductor tendinopathy. She has had multiple greater trochanteric bursa type ultrasound-guided injections that provided me good relief, more recently we did a hip joint injection that provided near complete relief for a few days. This does suggest the hip joint is her primary pain generator. She does however localize the pain laterally. In addition she does have a lumbar spine MRI that shows multilevel lumbar spinal stenosis, but she only has minimal pain from a posterior aspect. She has done formal physical therapy, she has been on multiple medications. At this point I would like a consultation with Dr. Steward Drone for discussion of operative intervention. If Dr. Steward Drone suspects that intervention to the hip abductors is warranted we can certainly try a PRP type percutaneous tenotomy. Until then refilling tramadol for use up to twice daily.

## 2023-03-24 NOTE — Progress Notes (Signed)
Laura [name], TSH looks little better this time it was 4.7 and 3.1 so back into the normal range your free T4 still just a little bit on the low side.  I definitely want to keep an eye on this.  It may be the beginning of being hypothyroid.  But right now it still functioning.  Recommend repeat levels in 6 months.

## 2023-03-31 ENCOUNTER — Ambulatory Visit: Payer: Managed Care, Other (non HMO)

## 2023-03-31 DIAGNOSIS — K439 Ventral hernia without obstruction or gangrene: Secondary | ICD-10-CM | POA: Diagnosis not present

## 2023-03-31 MED ORDER — IOHEXOL 9 MG/ML PO SOLN: 500.0000 mL | ORAL | Status: AC

## 2023-03-31 MED ORDER — IOHEXOL 300 MG/ML  SOLN
100.0000 mL | Freq: Once | INTRAMUSCULAR | Status: AC | PRN
  Administered 2023-03-31: 100 mL via INTRAVENOUS

## 2023-04-02 ENCOUNTER — Encounter: Payer: Self-pay | Admitting: Sports Medicine

## 2023-04-06 ENCOUNTER — Ambulatory Visit (INDEPENDENT_AMBULATORY_CARE_PROVIDER_SITE_OTHER): Payer: Managed Care, Other (non HMO)

## 2023-04-06 ENCOUNTER — Encounter: Payer: Self-pay | Admitting: Medical-Surgical

## 2023-04-06 ENCOUNTER — Ambulatory Visit (INDEPENDENT_AMBULATORY_CARE_PROVIDER_SITE_OTHER): Payer: Managed Care, Other (non HMO) | Admitting: Medical-Surgical

## 2023-04-06 VITALS — BP 100/65 | HR 65 | Ht 64.0 in | Wt 222.1 lb

## 2023-04-06 DIAGNOSIS — R0781 Pleurodynia: Secondary | ICD-10-CM

## 2023-04-06 NOTE — Progress Notes (Signed)
        Established patient visit  History, exam, impression, and plan:  1. Rib pain on right side Pleasant 52 year old female presenting today with complaints of right lower rib pain. Pain started on Wednesday last week when she was sitting on the cough and leaned over sideways to pick up her laptop. She felt a sharp, pinching pain in the right lower ribs immediately. The pain has worsened since then and it has now radiated around to her back. The area has been significantly sore/tender. She tried Tramadol this wasn't helpful for the pain. Pain is worse with movement and with deep breathing. No shortness of breath, cough, hemoptysis, or respiratory distress. On exam, HRR, S1/S2 normal. Respirations even and unlabored. Lungs CTA. Tenderness to palpation along the right lower rib anteriorly around to the lateral rib. Mild soreness to palpation in the right thoracic musculature. Checking X-rays today. Discussed conservative measures for pain management. Offered stronger pain medication but patient declined. Getting x-rays today. If no fracture, plan to add steroid burst to treat for costochondritis/inflammation. Work note provided to be out for 1 week. - DG Ribs Unilateral W/Chest Right; Future  Procedures performed this visit: None.  Return if symptoms worsen or fail to improve.  __________________________________ Thayer Ohm, DNP, APRN, FNP-BC Primary Care and Sports Medicine [facility] [place]

## 2023-04-12 ENCOUNTER — Encounter: Payer: Self-pay | Admitting: Medical-Surgical

## 2023-04-15 ENCOUNTER — Ambulatory Visit (HOSPITAL_BASED_OUTPATIENT_CLINIC_OR_DEPARTMENT_OTHER): Payer: Managed Care, Other (non HMO) | Admitting: Orthopaedic Surgery

## 2023-04-15 DIAGNOSIS — S76011A Strain of muscle, fascia and tendon of right hip, initial encounter: Secondary | ICD-10-CM | POA: Diagnosis not present

## 2023-04-15 DIAGNOSIS — M25551 Pain in right hip: Secondary | ICD-10-CM | POA: Diagnosis not present

## 2023-04-15 MED ORDER — PREDNISONE 20 MG PO TABS: ORAL_TABLET | ORAL | 0 refills | Status: AC

## 2023-04-15 MED ORDER — TRIAMCINOLONE ACETONIDE 40 MG/ML IJ SUSP
80.0000 mg | INTRAMUSCULAR | Status: AC | PRN
Start: 2023-04-15 — End: 2023-04-15
  Administered 2023-04-15: 80 mg via INTRA_ARTICULAR

## 2023-04-15 MED ORDER — LIDOCAINE HCL 1 % IJ SOLN
4.0000 mL | INTRAMUSCULAR | Status: AC | PRN
Start: 2023-04-15 — End: 2023-04-15
  Administered 2023-04-15: 4 mL

## 2023-04-15 NOTE — Progress Notes (Signed)
Chief Complaint: Right hip pain     History of Present Illness:    Laura [name] is a 52 y.o. female presents today with ongoing predominantly lateral based hip pain which has been quite bothersome over the course the last several years but worse over the last several months.  She does enjoy going to [place] with [place] children although walking longer distance has been profoundly painful on the outside of the hip.  She has been seen by Dr. Karie Schwalbe who is done several injections in both the right and the left hip.  She did get very significant relief of the left hip but subsequently has had recurrent pain on the right hip.  She is here today for further discussion.  She has been undergoing physical therapy as well with limited relief    Surgical History:   none  PMH/PSH/Family History/Social History/Meds/Allergies:    Past Medical History:  Diagnosis Date  . Allergic rhinitis   . Arthritis   . Depression   . Dyslipidemia   . Dysmenorrhea   . Family history of adverse reaction to anesthesia    mother slow to wake up  . Gall bladder disease   . GERD (gastroesophageal reflux disease)   . Headache   . History of kidney stones   . Infertility, female   . Obesity   . Pneumonia   . Polycystic ovary disease   . PONV (postoperative nausea and vomiting)   . 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 Sleep apnea    cpap  . WPW (Wolff-Parkinson-White syndrome)    hx of ablation 2012   Past Surgical History:  Procedure Laterality Date  . CESAREAN SECTION     x 2  . gastric bypass surgery     . INCISIONAL HERNIA REPAIR N/A 07/01/2021   Procedure: INCISIONAL HERNIA REPAIR WITH MESH;  Surgeon: Kinsinger, De Blanch, MD;  Location: [facility];  Service: General;  Laterality: N/A;  . LAPAROSCOPIC CHOLECYSTECTOMY    . left foot surgery     . SLEEVE GASTROPLASTY    . SUPRAVENTRICULAR TACHYCARDIA ABLATION N/A 07/23/2011   Procedure: SUPRAVENTRICULAR TACHYCARDIA  ABLATION;  Surgeon: Marinus Maw, MD;  Location: [facility] CATH LAB;  Service: Cardiovascular;  Laterality: N/A;  . transthoracic echcardiogram  09/15/2006   Social History   Socioeconomic History  . Marital status: Married    Spouse name: matthew  . Number of children: 5  . Years of education: Not on file  . Highest education level: Not on file  Occupational History  . Occupation: Copywriter, advertising: [employer]  Tobacco Use  . Smoking status: Former    Current packs/day: 0.00    Types: Cigarettes    Quit date: 07/15/2002    Years since quitting: 20.7  . Smokeless tobacco: Never  Vaping Use  . Vaping status: Never Used  Substance and Sexual Activity  . Alcohol use: Never  . Drug use: Never  . Sexual activity: Yes    Partners: Male  Other Topics Concern  . Not on file  Social History Narrative   Some exercise.  In bariatric program at [facility].    Social Determinants of Health   Financial Resource Strain: Low Risk  (11/23/2022)   Received from [facility], [facility]   Overall  Financial Resource Strain (CARDIA)   . Difficulty of Paying Living Expenses: Not hard at all  Food Insecurity: No Food Insecurity (11/23/2022)   Received from [facility], [facility]   Hunger Vital Sign   . Worried About Programme researcher, broadcasting/film/video in the Last Year: Never true   . Ran Out of Food in the Last Year: Never true  Transportation Needs: No Transportation Needs (11/23/2022)   Received from [facility], [facility]   [facility] - Transportation   . Lack of Transportation (Medical): No   . Lack of Transportation (Non-Medical): No  Physical Activity: Insufficiently Active (11/23/2022)   [facility], [facility]   Exercise Vital Sign   . Days of Exercise per Week: 3 days   . Minutes of Exercise per Session: 30 min  Stress: Stress Concern Present (11/23/2022)   Received from [facility], [facility] -  Occupational Stress Questionnaire   . Feeling of Stress : To some extent  Social Connections: Moderately Integrated (11/23/2022)   Received from [facility], [facility]   Social Network   . How would you rate your social network (family, work, friends)?: Adequate participation with social networks   Family History  Problem Relation Age of Onset  . Colon cancer Mother 2  . Breast cancer Mother   . Diabetes Mother   . Heart attack Mother        AMI, in late 2's  . Depression Mother   . Heart disease Mother   . Hypertension Father   . Thyroid disease Brother   . Thyroid disease Sister    Allergies  Allergen Reactions  . Morphine Itching  . Chromium Other (See Comments)    causes contact dermatitis  . Neomycin     Blisters on hands  . Penicillins Hives   Current Outpatient Medications  Medication Sig Dispense Refill  . BIOTIN 5000 PO Take 5,000 mcg by mouth daily.    . Calcium Carbonate (CALCIUM 600 PO) Take 600 mg by mouth daily.    . Cholecalciferol 50 MCG (2000 UT) TABS Take 2,000 Units by mouth daily.    . clobetasol ointment (TEMOVATE) 0.05 % Apply 1 application topically 2 (two) times daily as needed (eczema).    . dicyclomine (BENTYL) 20 MG tablet Take 20 mg by mouth 3 (three) times daily.  2  . DULoxetine (CYMBALTA) 30 MG capsule Take 1 capsule (30 mg total) by mouth daily. 90 capsule 0  . escitalopram (LEXAPRO) 10 MG tablet Take 1 tablet (10 mg total) by mouth daily. 90 tablet 1  . famotidine (PEPCID) 20 MG tablet Take 20 mg by mouth at bedtime as needed for heartburn or indigestion.    . fluticasone (FLONASE) 50 MCG/ACT nasal spray Place 2 sprays into both nostrils daily. (Patient taking differently: Place 2 sprays into both nostrils daily as needed for allergies.) 16 g 6  . ipratropium (ATROVENT) 0.06 % nasal spray INSTILL 2 SPRAYS INTO BOTH NOSTRILS FOUR TIMES A DAY 45 mL 1  . levalbuterol (XOPENEX HFA) 45 MCG/ACT inhaler Inhale 1-2 puffs into the lungs every 6  (six) hours as needed for wheezing. 15 g 0  . omeprazole (PRILOSEC) 40 MG capsule Take 40 mg by mouth in the morning and at bedtime.    . ondansetron (ZOFRAN) 4 MG tablet Take 1 tablet (4 mg total) by mouth every 8 (eight) hours as needed for nausea or vomiting. 10 tablet 0  . predniSONE (  DELTASONE) 20 MG tablet Take 3 tablets (60 mg total) by mouth daily with breakfast for 2 days, THEN 2 tablets (40 mg total) daily with breakfast for 2 days, THEN 1 tablet (20 mg total) daily with breakfast for 2 days, THEN 0.5 tablets (10 mg total) daily with breakfast for 2 days. 13 tablet 0  . Prenatal Vit-Fe Fumarate-FA (PRENATAL VITAMIN PO) Take 1 tablet by mouth daily.    . rizatriptan (MAXALT-MLT) 10 MG disintegrating tablet Take 1 tablet (10 mg total) by mouth as needed for migraine. May repeat in 2 hours if needed 10 tablet 5  . topiramate (TOPAMAX) 100 MG tablet Take 1 tablet (100 mg total) by mouth 2 (two) times daily. 180 tablet 1  . traMADol (ULTRAM) 50 MG tablet Take 1 tablet (50 mg total) by mouth 2 (two) times daily as needed for moderate pain. 60 tablet 0   No current facility-administered medications for this visit.   No results found.  Review of Systems:   A ROS was performed including pertinent positives and negatives as documented in the HPI.  Physical Exam :   Constitutional: NAD and appears stated age Neurological: Alert and oriented Psych: Appropriate affect and cooperative Last menstrual period 02/12/2019.   Comprehensive Musculoskeletal Exam:    Inspection Right Left  Skin No atrophy or gross abnormalities appreciated No atrophy or gross abnormalities appreciated  Palpation    Tenderness None None  Crepitus None None  Range of Motion    Flexion (passive) 120 120  Extension 30 30  IR 30 with pain lateral 30  ER 45 with pain lateral  45  Strength    Flexion  5/5 5/5  Extension 5/5 5/5  Special Tests    FABER Negative Negative  FADIR Negative Negative  ER Lag/Capsular  Insufficiency Negative Negative  Instability Negative Negative  Sacroiliac pain Negative  Negative   Instability    Generalized Laxity No No  Neurologic    sciatic, femoral, obturator nerves intact to light sensation  Vascular/Lymphatic    DP pulse 2+ 2+  Lumbar Exam    Patient has symmetric lumbar range of motion with negative pain referral to hip    Trendelenburg gait with lateral based hip pain over the trochanter Imaging:   Xray (4 views right hip): 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   Assessment:   52 y.o. female with lateral trochanteric based hip pain in the setting of a gluteus minimus tear.  I did describe that overall her exam today is more consistent with abductor tendinopathy.  This is also consistent on her MRI.  She does occasionally have groin pain although this is overall quite mild.  At this time I would like to perform a diagnostic ultrasound based injection over the gluteus minimus and medius tendon so I can assess how much pain relief she gets over the course of the next 2 weeks.  Given the fact that she has ultimately trialed physical therapy as well as previous injections now I do ultimately believe that she may be a candidate for surgical repair although I would like her to be able to ascertain how much relief she gets from an isolated gluteus injection.  Will plan to perform this today.  I will see her back in 2 weeks for reassessment  Plan :    -  Right hip ultrasound-guided gluteus minimum injection performed and verbal consent obtained    Procedure Note  Patient: Laura [name]             Date of Birth: Dec 17, 1970           MRN: [id number]             Visit Date: 04/15/2023  Procedures: Visit Diagnoses: No diagnosis  found.  Large Joint Inj: R greater trochanter on 04/15/2023 9:01 PM Indications: pain Details: 22 G 3.5 in needle, ultrasound-guided anterolateral approach  Arthrogram: No  Medications: 4 mL lidocaine 1 %; 80 mg triamcinolone acetonide 40 MG/ML Outcome: tolerated well, no immediate complications Procedure, treatment alternatives, risks and benefits explained, specific risks discussed. Consent was given by the patient. Immediately prior to procedure a time out was called to verify the correct patient, procedure, equipment, support staff and site/side marked as required. Patient was prepped and draped in the usual sterile fashion.       I personally saw and evaluated the patient, and participated in the management and treatment plan.  Huel Cote, MD Attending Physician, Orthopedic Surgery  This document was dictated using Dragon voice recognition software. A reasonable attempt at proof reading has been made to minimize errors.

## 2023-05-01 ENCOUNTER — Ambulatory Visit (HOSPITAL_BASED_OUTPATIENT_CLINIC_OR_DEPARTMENT_OTHER): Payer: Managed Care, Other (non HMO) | Admitting: Orthopaedic Surgery

## 2023-05-01 DIAGNOSIS — S76011A Strain of muscle, fascia and tendon of right hip, initial encounter: Secondary | ICD-10-CM | POA: Diagnosis not present

## 2023-05-01 NOTE — Progress Notes (Signed)
Chief Complaint: Right hip pain     History of Present Illness:   05/01/2023: Presents today for follow-up of the right hip.  Overall she is feeling approximately 90% better.  She is quite happy with her outcome at this time.  Laura [name] is a 52 y.o. female presents today with ongoing predominantly lateral based hip pain which has been quite bothersome over the course the last several years but worse over the last several months.  She does enjoy going to [place] with her children although walking longer distance has been profoundly painful on the outside of the hip.  She has been seen by Dr. Karie Schwalbe who is done several injections in both the right and the left hip.  She did get very significant relief of the left hip but subsequently has had recurrent pain on the right hip.  She is here today for further discussion.  She has been undergoing physical therapy as well with limited relief    Surgical History:   none  PMH/PSH/Family History/Social History/Meds/Allergies:    Past Medical History:  Diagnosis Date   Allergic rhinitis    Arthritis    Depression    Dyslipidemia    Dysmenorrhea    Family history of adverse reaction to anesthesia    mother slow to wake up   Gall bladder disease    GERD (gastroesophageal reflux disease)    Headache    History of kidney stones    Infertility, female    Obesity    Pneumonia    Polycystic ovary disease    PONV (postoperative nausea and vomiting)    Sebaceous hyperplasia     on tretinoin   Sleep apnea    cpap   WPW (Wolff-Parkinson-White syndrome)    hx of ablation 2012   Past Surgical History:  Procedure Laterality Date   CESAREAN SECTION     x 2   gastric bypass surgery      INCISIONAL HERNIA REPAIR N/A 07/01/2021   Procedure: INCISIONAL HERNIA REPAIR WITH MESH;  Surgeon: Kinsinger, De Blanch, MD;  Location: [facility];  Service: General;  Laterality: N/A;   LAPAROSCOPIC CHOLECYSTECTOMY     left foot  surgery      SLEEVE GASTROPLASTY     SUPRAVENTRICULAR TACHYCARDIA ABLATION N/A 07/23/2011   Procedure: SUPRAVENTRICULAR TACHYCARDIA ABLATION;  Surgeon: Marinus Maw, MD;  Location: [facility] CATH LAB;  Service: Cardiovascular;  Laterality: N/A;   transthoracic echcardiogram  09/15/2006   Social History   Socioeconomic History   Marital status: Married    Spouse name: matthew   Number of children: 5   Years of education: Not on file   Highest education level: Not on file  Occupational History   Occupation: Copywriter, advertising: [employer]  Tobacco Use   Smoking status: Former    Current packs/day: 0.00    Types: Cigarettes    Quit date: 07/15/2002    Years since quitting: 20.8   Smokeless tobacco: Never  Vaping Use   Vaping status: Never Used  Substance and Sexual Activity   Alcohol use: Never   Drug use: Never   Sexual activity: Yes    Partners: Male  Other Topics Concern   Not on file  Social History Narrative   Some exercise.  In bariatric program at  [facility].    Social Determinants of Health   Financial Resource Strain: Low Risk  (04/29/2023)   Received from [facility]   Overall Financial Resource Strain (CARDIA)    Difficulty of Paying Living Expenses: Not hard at all  Food Insecurity: No Food Insecurity (04/29/2023)   Received from [facility]   Hunger Vital Sign    Worried About Running Out of Food in the Last Year: Never true    Ran Out of Food in the Last Year: Never true  Transportation Needs: No Transportation Needs (04/29/2023)   Received from [facility] - Transportation    Lack of Transportation (Medical): No    Lack of Transportation (Non-Medical): No  Physical Activity: Insufficiently Active (04/29/2023)   Received from [facility]   Exercise Vital Sign    Days of Exercise per Week: 2 days    Minutes of Exercise per Session: 20 min  Stress: No Stress Concern Present (04/29/2023)   Received from [facility] - Occupational Stress Questionnaire    Feeling of Stress : Not at all  Social Connections: Moderately Integrated (04/29/2023)   Received from [facility]   Social Network    How would you rate your social network (family, work, friends)?: Adequate participation with social networks   Family History  Problem Relation Age of Onset   Colon cancer Mother 84   Breast cancer Mother    Diabetes Mother    Heart attack Mother        AMI, in late 32's   Depression Mother    Heart disease Mother    Hypertension Father    Thyroid disease Brother    Thyroid disease Sister    Allergies  Allergen Reactions   Morphine Itching   Chromium Other (See Comments)    causes contact dermatitis   Neomycin     Blisters on hands   Penicillins Hives   Current Outpatient Medications  Medication Sig Dispense Refill   BIOTIN 5000 PO Take 5,000 mcg by mouth daily.     Calcium Carbonate (CALCIUM 600 PO) Take 600 mg by mouth daily.     Cholecalciferol 50 MCG (2000 UT) TABS Take 2,000 Units by mouth daily.     clobetasol ointment (TEMOVATE) 0.05 % Apply 1 application topically 2 (two) times daily as needed (eczema).     dicyclomine (BENTYL) 20 MG tablet Take 20 mg by mouth 3 (three) times daily.  2   DULoxetine (CYMBALTA) 30 MG capsule Take 1 capsule (30 mg total) by mouth daily. 90 capsule 0   escitalopram (LEXAPRO) 10 MG tablet Take 1 tablet (10 mg total) by mouth daily. 90 tablet 1   famotidine (PEPCID) 20 MG tablet Take 20 mg by mouth at bedtime as needed for heartburn or indigestion.     fluticasone (FLONASE) 50 MCG/ACT nasal spray Place 2 sprays into both nostrils daily. (Patient taking differently: Place 2 sprays into both nostrils daily as needed for allergies.) 16 g 6   ipratropium (ATROVENT) 0.06 % nasal spray INSTILL 2 SPRAYS INTO BOTH NOSTRILS FOUR TIMES A DAY 45 mL 1   levalbuterol (XOPENEX HFA) 45 MCG/ACT inhaler Inhale 1-2 puffs into the lungs every 6 (six)  hours as needed for wheezing. 15 g 0   omeprazole (PRILOSEC) 40 MG capsule Take 40 mg by mouth in the morning and at bedtime.     ondansetron (ZOFRAN) 4 MG tablet Take 1 tablet (4 mg total) by  mouth every 8 (eight) hours as needed for nausea or vomiting. 10 tablet 0   Prenatal Vit-Fe Fumarate-FA (PRENATAL VITAMIN PO) Take 1 tablet by mouth daily.     rizatriptan (MAXALT-MLT) 10 MG disintegrating tablet Take 1 tablet (10 mg total) by mouth as needed for migraine. May repeat in 2 hours if needed 10 tablet 5   topiramate (TOPAMAX) 100 MG tablet Take 1 tablet (100 mg total) by mouth 2 (two) times daily. 180 tablet 1   traMADol (ULTRAM) 50 MG tablet Take 1 tablet (50 mg total) by mouth 2 (two) times daily as needed for moderate pain. 60 tablet 0   No current facility-administered medications for this visit.   No results found.  Review of Systems:   A ROS was performed including pertinent positives and negatives as documented in the HPI.  Physical Exam :   Constitutional: NAD and appears stated age Neurological: Alert and oriented Psych: Appropriate affect and cooperative Last menstrual period 02/12/2019.   Comprehensive Musculoskeletal Exam:    Inspection Right Left  Skin No atrophy or gross abnormalities appreciated No atrophy or gross abnormalities appreciated  Palpation    Tenderness None None  Crepitus None None  Range of Motion    Flexion (passive) 120 120  Extension 30 30  IR 30 with pain lateral 30  ER 45 with pain lateral  45  Strength    Flexion  5/5 5/5  Extension 5/5 5/5  Special Tests    FABER Negative Negative  FADIR Negative Negative  ER Lag/Capsular Insufficiency Negative Negative  Instability Negative Negative  Sacroiliac pain Negative  Negative   Instability    Generalized Laxity No No  Neurologic    sciatic, femoral, obturator nerves intact to light sensation  Vascular/Lymphatic    DP pulse 2+ 2+  Lumbar Exam    Patient has symmetric lumbar range of  motion with negative pain referral to hip    Trendelenburg gait with lateral based hip pain over the trochanter Imaging:   Xray (4 views right hip): 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   Assessment:   52 y.o. female with lateral trochanteric based hip pain in the setting of a gluteus minimus tear.  She did get extremely good relief from the injection.  Given the fact that she does have a small tear I did describe the there is possible that this injection may work for many many months if not years.  She will plan to shoot [place] a message should this wear off. Plan :    -Return to clinic as needed    I personally saw and evaluated the patient, and participated in the management and treatment plan.  Huel Cote, MD Attending Physician, Orthopedic Surgery  This document was dictated using Dragon voice recognition software. A reasonable attempt at proof reading has been made to minimize errors.

## 2023-06-19 ENCOUNTER — Other Ambulatory Visit: Payer: Self-pay | Admitting: Family Medicine

## 2023-06-19 DIAGNOSIS — G43809 Other migraine, not intractable, without status migrainosus: Secondary | ICD-10-CM

## 2023-07-30 ENCOUNTER — Encounter (HOSPITAL_BASED_OUTPATIENT_CLINIC_OR_DEPARTMENT_OTHER): Payer: Self-pay | Admitting: Orthopaedic Surgery

## 2023-07-30 ENCOUNTER — Other Ambulatory Visit (INDEPENDENT_AMBULATORY_CARE_PROVIDER_SITE_OTHER): Payer: Managed Care, Other (non HMO)

## 2023-07-30 ENCOUNTER — Ambulatory Visit (INDEPENDENT_AMBULATORY_CARE_PROVIDER_SITE_OTHER): Payer: Managed Care, Other (non HMO) | Admitting: Sports Medicine

## 2023-07-30 DIAGNOSIS — M19011 Primary osteoarthritis, right shoulder: Secondary | ICD-10-CM

## 2023-07-30 MED ORDER — TRIAMCINOLONE ACETONIDE 40 MG/ML IJ SUSP
40.0000 mg | Freq: Once | INTRAMUSCULAR | Status: AC
Start: 2023-07-30 — End: 2023-07-30
  Administered 2023-07-30: 40 mg via INTRAMUSCULAR

## 2023-07-30 NOTE — Progress Notes (Signed)
    Procedures performed today:    Procedure: Real-time Ultrasound Guided injection of the right acromioclavicular joint Device: Samsung HS60  Verbal informed consent obtained.  Time-out conducted.  Noted no overlying erythema, induration, or other signs of local infection.  Skin prepped in a sterile fashion.  Local anesthesia: Topical Ethyl chloride.  With sterile technique and under real time ultrasound guidance: Arthritic joint noted, 1/2 cc lidocaine, 1/2 cc kenalog 40 injected easily.  Completed without difficulty  Advised to call if fevers/chills, erythema, induration, drainage, or persistent bleeding.  Images permanently stored and available for review in PACS.  Impression: Technically successful ultrasound guided injection.  Independent interpretation of notes and tests performed by another provider:   None.  Brief History, Exam, Impression, and Recommendations:    Arthritis of right acromioclavicular joint Pleasant 52 year old female, known right acromioclavicular osteoarthritis, historically she has done really well with [facility] joint injections with ultrasound guidance last done in July. Repeated today, return as needed.    ____________________________________________ Ihor Austin. Benjamin Stain, M.D., ABFM., CAQSM., AME. Primary Care and Sports Medicine [facility]  Adjunct Professor of Family Medicine  [place] [employer] [facility] [employer] [facility] [employer] [employer]  Restaurant manager, fast food

## 2023-07-30 NOTE — Assessment & Plan Note (Signed)
Pleasant 52 year old female, known right acromioclavicular osteoarthritis, historically she has done really well with [facility] joint injections with ultrasound guidance last done in July. Repeated today, return as needed.

## 2023-08-03 ENCOUNTER — Ambulatory Visit (INDEPENDENT_AMBULATORY_CARE_PROVIDER_SITE_OTHER): Payer: Managed Care, Other (non HMO) | Admitting: Student

## 2023-08-03 ENCOUNTER — Encounter (HOSPITAL_BASED_OUTPATIENT_CLINIC_OR_DEPARTMENT_OTHER): Payer: Self-pay | Admitting: Student

## 2023-08-03 DIAGNOSIS — S76011A Strain of muscle, fascia and tendon of right hip, initial encounter: Secondary | ICD-10-CM | POA: Diagnosis not present

## 2023-08-03 DIAGNOSIS — M25552 Pain in left hip: Secondary | ICD-10-CM

## 2023-08-03 NOTE — Progress Notes (Unsigned)
Chief Complaint: Bilateral hip pain     History of Present Illness:    Laura [name] is a 52 y.o. female here today with a chief complaint of lateral hip pain of both hips, right worse than left.  She did have a cortisone injection with Dr. Steward Drone on 04/15/2023 of the right gluteus tendons which did give her great relief.  This lasted up until last week and reports that she may have overdone it with her activity level.  Rates pain today at an 8/10.  Has been taking occasional tramadol which does help give her relief as well as using ice and heat.   Surgical History:   None  PMH/PSH/Family History/Social History/Meds/Allergies:    Past Medical History:  Diagnosis Date   Allergic rhinitis    Arthritis    Depression    Dyslipidemia    Dysmenorrhea    Family history of adverse reaction to anesthesia    mother slow to wake up   Gall bladder disease    GERD (gastroesophageal reflux disease)    Headache    History of kidney stones    Infertility, female    Obesity    Pneumonia    Polycystic ovary disease    PONV (postoperative nausea and vomiting)    Sebaceous hyperplasia     on tretinoin   Sleep apnea    cpap   WPW (Wolff-Parkinson-White syndrome)    hx of ablation 2012   Past Surgical History:  Procedure Laterality Date   CESAREAN SECTION     x 2   gastric bypass surgery      INCISIONAL HERNIA REPAIR N/A 07/01/2021   Procedure: INCISIONAL HERNIA REPAIR WITH MESH;  Surgeon: Kinsinger, De Blanch, MD;  Location: [facility];  Service: General;  Laterality: N/A;   LAPAROSCOPIC CHOLECYSTECTOMY     left foot surgery      SLEEVE GASTROPLASTY     SUPRAVENTRICULAR TACHYCARDIA ABLATION N/A 07/23/2011   Procedure: SUPRAVENTRICULAR TACHYCARDIA ABLATION;  Surgeon: Marinus Maw, MD;  Location: [facility] CATH LAB;  Service: Cardiovascular;  Laterality: N/A;   transthoracic echcardiogram  09/15/2006   Social History   Socioeconomic History   Marital  status: Married    Spouse name: matthew   Number of children: 5   Years of education: Not on file   Highest education level: Not on file  Occupational History   Occupation: Copywriter, advertising: [employer]  Tobacco Use   Smoking status: Former    Current packs/day: 0.00    Types: Cigarettes    Quit date: 07/15/2002    Years since quitting: 21.0   Smokeless tobacco: Never  Vaping Use   Vaping status: Never Used  Substance and Sexual Activity   Alcohol use: Never   Drug use: Never   Sexual activity: Yes    Partners: Male  Other Topics Concern   Not on file  Social History Narrative   Some exercise.  In bariatric program at [facility].    Social Drivers of Corporate investment banker Strain: Low Risk  (04/29/2023)   Received from [facility]   Overall Financial Resource Strain (CARDIA)    Difficulty of Paying Living Expenses: Not hard at all  Food Insecurity: No Food Insecurity (04/29/2023)   Received from [facility]  Vital Sign    Worried About Programme researcher, broadcasting/film/video in the Last Year: Never true    Ran Out of Food in the Last Year: Never true  Transportation Needs: No Transportation Needs (04/29/2023)   Received from [facility] - Transportation    Lack of Transportation (Medical): No    Lack of Transportation (Non-Medical): No  Physical Activity: Insufficiently Active (04/29/2023)   Received from [facility]   Exercise Vital Sign    Days of Exercise per Week: 2 days    Minutes of Exercise per Session: 20 min  Stress: No Stress Concern Present (04/29/2023)   Received from [facility] Occupational Health - Occupational Stress Questionnaire    Feeling of Stress : Not at all  Social Connections: Moderately Integrated (04/29/2023)   Received from [facility]   Social Network    How would you rate your social network (family, work, friends)?: Adequate participation with social networks   Family History  Problem  Relation Age of Onset   Colon cancer Mother 39   Breast cancer Mother    Diabetes Mother    Heart attack Mother        AMI, in late 45's   Depression Mother    Heart disease Mother    Hypertension Father    Thyroid disease Brother    Thyroid disease Sister    Allergies  Allergen Reactions   Morphine Itching   Chromium Other (See Comments)    causes contact dermatitis   Neomycin     Blisters on hands   Penicillins Hives   Current Outpatient Medications  Medication Sig Dispense Refill   BIOTIN 5000 PO Take 5,000 mcg by mouth daily.     Calcium Carbonate (CALCIUM 600 PO) Take 600 mg by mouth daily.     Cholecalciferol 50 MCG (2000 UT) TABS Take 2,000 Units by mouth daily.     clobetasol ointment (TEMOVATE) 0.05 % Apply 1 application topically 2 (two) times daily as needed (eczema).     dicyclomine (BENTYL) 20 MG tablet Take 20 mg by mouth 3 (three) times daily.  2   DULoxetine (CYMBALTA) 30 MG capsule Take 1 capsule (30 mg total) by mouth daily. 90 capsule 0   escitalopram (LEXAPRO) 10 MG tablet Take 1 tablet (10 mg total) by mouth daily. 90 tablet 1   famotidine (PEPCID) 20 MG tablet Take 20 mg by mouth at bedtime as needed for heartburn or indigestion.     fluticasone (FLONASE) 50 MCG/ACT nasal spray Place 2 sprays into both nostrils daily. (Patient taking differently: Place 2 sprays into both nostrils daily as needed for allergies.) 16 g 6   ipratropium (ATROVENT) 0.06 % nasal spray INSTILL 2 SPRAYS INTO BOTH NOSTRILS FOUR TIMES A DAY 45 mL 1   levalbuterol (XOPENEX HFA) 45 MCG/ACT inhaler Inhale 1-2 puffs into the lungs every 6 (six) hours as needed for wheezing. 15 g 0   omeprazole (PRILOSEC) 40 MG capsule Take 40 mg by mouth in the morning and at bedtime.     ondansetron (ZOFRAN) 4 MG tablet Take 1 tablet (4 mg total) by mouth every 8 (eight) hours as needed for nausea or vomiting. 10 tablet 0   Prenatal Vit-Fe Fumarate-FA (PRENATAL VITAMIN PO) Take 1 tablet by mouth daily.      rizatriptan (MAXALT-MLT) 10 MG disintegrating tablet Take 1 tablet (10 mg total) by mouth as needed for migraine. May repeat in 2 hours if needed 10  tablet 5   topiramate (TOPAMAX) 100 MG tablet TAKE 1 TABLET BY MOUTH TWICE A DAY 180 tablet 1   traMADol (ULTRAM) 50 MG tablet Take 1 tablet (50 mg total) by mouth 2 (two) times daily as needed for moderate pain. 60 tablet 0   No current facility-administered medications for this visit.   No results found.  Review of Systems:   A ROS was performed including pertinent positives and negatives as documented in the HPI.  Physical Exam :   Constitutional: NAD and appears stated age Neurological: Alert and oriented Psych: Appropriate affect and cooperative Last menstrual period 02/12/2019.   Comprehensive Musculoskeletal Exam:    Inspection Right Left  Skin No atrophy or gross abnormalities appreciated No atrophy or gross abnormalities appreciated  Palpation    Tenderness Greater trochanter Greater trochanter  Crepitus None None  Range of Motion    Flexion (passive) 120 120  Extension 30 30  IR 30 30  ER 45 45  Strength    Flexion  5/5 5/5  Extension 5/5 5/5  Special Tests    FABER Negative Negative  FADIR Negative Negative  Instability Negative Negative  Sacroiliac pain Negative  Negative   Instability    Generalized Laxity No No  Neurologic    sciatic, femoral, obturator nerves intact to light sensation     Imaging:    Assessment:   52 y.o. female with chronic pain over bilateral greater trochanters worse on the right side.  She does have findings of gluteus minimus tendinosis and trochanteric bursal fluid on MRI from earlier this year.  She also reports getting much more relief with previous gluteal tendon injection than she had with prior injections.  For this reason I believe she would benefit from repeating this today for symptomatic relief.  I did perform bilateral gluteus minimus/medius tendon injections under  ultrasound guidance today and she tolerated this extremely well.  Will have her assess the relief she gets from these and continue following with Dr. Steward Drone as needed.  Plan :    -Bilateral hip gluteus injections performed today -Return to clinic as needed    Procedure Note  Patient: Laura [name]             Date of Birth: June 23, 1971           MRN: [id number]             Visit Date: 08/03/2023  Procedures: Visit Diagnoses:  1. Strain of gluteus medius of right lower extremity, initial encounter   2. Pain in left hip     Large Joint Inj: bilateral greater trochanter on 08/03/2023 6:35 PM Indications: pain Details: 22 G 3.5 in needle, ultrasound-guided lateral approach Medications (Right): 4 mL lidocaine 1 %; 2 mL triamcinolone acetonide 40 MG/ML Medications (Left): 4 mL lidocaine 1 %; 2 mL triamcinolone acetonide 40 MG/ML Outcome: tolerated well, no immediate complications Procedure, treatment alternatives, risks and benefits explained, specific risks discussed. Consent was given by the patient. Immediately prior to procedure a time out was called to verify the correct patient, procedure, equipment, support staff and site/side marked as required. Patient was prepped and draped in the usual sterile fashion.      I personally saw and evaluated the patient, and participated in the management and treatment plan.  Hazle Nordmann, PA-C Orthopedics

## 2023-08-04 MED ORDER — LIDOCAINE HCL 1 % IJ SOLN
4.0000 mL | INTRAMUSCULAR | Status: AC | PRN
Start: 2023-08-03 — End: 2023-08-03
  Administered 2023-08-03: 4 mL

## 2023-08-04 MED ORDER — TRIAMCINOLONE ACETONIDE 40 MG/ML IJ SUSP
2.0000 mL | INTRAMUSCULAR | Status: AC | PRN
Start: 2023-08-03 — End: 2023-08-03
  Administered 2023-08-03: 2 mL via INTRA_ARTICULAR

## 2023-08-10 ENCOUNTER — Ambulatory Visit (INDEPENDENT_AMBULATORY_CARE_PROVIDER_SITE_OTHER): Payer: Managed Care, Other (non HMO) | Admitting: Family Medicine

## 2023-08-10 ENCOUNTER — Encounter: Payer: Self-pay | Admitting: Family Medicine

## 2023-08-10 VITALS — BP 111/54 | HR 54 | Ht 64.0 in | Wt 221.0 lb

## 2023-08-10 DIAGNOSIS — R233 Spontaneous ecchymoses: Secondary | ICD-10-CM | POA: Diagnosis not present

## 2023-08-10 DIAGNOSIS — J309 Allergic rhinitis, unspecified: Secondary | ICD-10-CM

## 2023-08-10 DIAGNOSIS — Z23 Encounter for immunization: Secondary | ICD-10-CM

## 2023-08-10 DIAGNOSIS — G43809 Other migraine, not intractable, without status migrainosus: Secondary | ICD-10-CM

## 2023-08-10 DIAGNOSIS — F330 Major depressive disorder, recurrent, mild: Secondary | ICD-10-CM | POA: Diagnosis not present

## 2023-08-10 DIAGNOSIS — R7989 Other specified abnormal findings of blood chemistry: Secondary | ICD-10-CM | POA: Insufficient documentation

## 2023-08-10 DIAGNOSIS — F4310 Post-traumatic stress disorder, unspecified: Secondary | ICD-10-CM

## 2023-08-10 MED ORDER — IPRATROPIUM BROMIDE 0.06 % NA SOLN: NASAL | 1 refills | Status: DC

## 2023-08-10 MED ORDER — ALPRAZOLAM 0.5 MG PO TABS: 0.2500 mg | ORAL_TABLET | Freq: Every evening | ORAL | 0 refills | Status: DC | PRN

## 2023-08-10 MED ORDER — ONDANSETRON HCL 4 MG PO TABS: 4.0000 mg | ORAL_TABLET | Freq: Three times a day (TID) | ORAL | 0 refills | Status: DC | PRN

## 2023-08-10 NOTE — Assessment & Plan Note (Signed)
Controlled on current regimen.  Follow-up in 6 months.

## 2023-08-10 NOTE — Assessment & Plan Note (Signed)
Continue current regimen.  Unfortunately having some PTSD.  We discussed possible referral to therapy for particularly addressing the PTSD stressor.  For short-term sent prescription for alprazolam to use sparingly at bedtime when it is difficult for her to sleep because of recurring thoughts.

## 2023-08-10 NOTE — Progress Notes (Signed)
Established Patient Office Visit  Subjective  Patient ID: Laura [name], female    DOB: 1970-11-06  Age: 52 y.o. MRN: [id number]  Chief Complaint  Patient presents with   Medical Management of Chronic Issues    HPI 6 months F/U depression -doing well on her current regimen of Lexapro she is completely off the duloxetine now.  Unfortunately she is having recurring thoughts of having witnessed somebody commit suicide after the jumped off a bridge 20 some years ago.  There was recently a similar suicide in her local area and that really triggered some of her thoughts she is having difficulty sleeping.  She reports some easy bruising and thin skin tears easily on her forearms.  She has noticed some easy bruising on her lower legs but she does not get the fragile skin on her lower legs.  Follow-up migraines-currently on Topamax and doing really well she is only had 2 migraines since she last saw me.  So using the Maxalt as needed and it does seem to be effective.    ROS    Objective:     BP (!) 111/54   Pulse (!) 54   Ht 5\' 4"  (1.626 m)   Wt 221 lb (100.2 kg)   LMP 02/12/2019   SpO2 97%   BMI 37.93 kg/m    Physical Exam Vitals and nursing note reviewed.  Constitutional:      Appearance: Normal appearance.  HENT:     Head: Normocephalic and atraumatic.  Eyes:     Conjunctiva/sclera: Conjunctivae normal.  Cardiovascular:     Rate and Rhythm: Normal rate and regular rhythm.  Pulmonary:     Effort: Pulmonary effort is normal.     Breath sounds: Normal breath sounds.  Skin:    General: Skin is warm and dry.     Comments: Skin on posterior forearms looks consistent with senile purpura.  Neurological:     Mental Status: She is alert.  Psychiatric:        Mood and Affect: Mood normal.      No results found for any visits on 08/10/23.    The 10-year ASCVD risk score (Arnett DK, et al., 2019) is: 1%    Assessment & Plan:   Problem List Items Addressed This  Visit       Cardiovascular and Mediastinum   Migraine   Controlled on current regimen.  Follow-up in 6 months.        Other   Major depressive disorder, recurrent episode (HCC)   Continue current regimen.  Unfortunately having some PTSD.  We discussed possible referral to therapy for particularly addressing the PTSD stressor.  For short-term sent prescription for alprazolam to use sparingly at bedtime when it is difficult for her to sleep because of recurring thoughts.      Relevant Medications   ALPRAZolam (XANAX) 0.5 MG tablet   Abnormal serum thyroxine (T4) level   Recheck levels today since it has been 6 months.      Relevant Orders   TSH+T4F+T3Free   Other Visit Diagnoses       Encounter for immunization    -  Primary   Relevant Orders   Flu vaccine trivalent PF, 6mos and older(Flulaval,Afluria,Fluarix,Fluzone) (Completed)     Easy bruising       Relevant Orders   INR/PT   CBC with Differential/Platelet     Allergic rhinitis, unspecified seasonality, unspecified trigger       Relevant Medications   ipratropium (ATROVENT) 0.06 %  nasal spray     PTSD (post-traumatic stress disorder)       Relevant Medications   ALPRAZolam (XANAX) 0.5 MG tablet      Easy bruising on forearms it looks most consistent visually with senile purpura.  But we did discuss doing at least some labs to make sure platelet levels are normal as well as PT/INR.  Not taking any blood thinning products such as fish oil or aspirin.    Return in about 6 months (around 02/08/2024) for Mood and migraines .    Nani Gasser, MD

## 2023-08-10 NOTE — Assessment & Plan Note (Signed)
Recheck levels today since it has been 6 months.

## 2023-08-11 LAB — CBC WITH DIFFERENTIAL/PLATELET
Basophils Absolute: 0 10*3/uL (ref 0.0–0.2)
Basos: 0 %
EOS (ABSOLUTE): 0 10*3/uL (ref 0.0–0.4)
Eos: 0 %
Hematocrit: 48.6 % — ABNORMAL HIGH (ref 34.0–46.6)
Hemoglobin: 15.7 g/dL (ref 11.1–15.9)
Immature Grans (Abs): 0 10*3/uL (ref 0.0–0.1)
Immature Granulocytes: 0 %
Lymphocytes Absolute: 1.8 10*3/uL (ref 0.7–3.1)
Lymphs: 19 %
MCH: 31.2 pg (ref 26.6–33.0)
MCHC: 32.3 g/dL (ref 31.5–35.7)
MCV: 97 fL (ref 79–97)
Monocytes Absolute: 0.4 10*3/uL (ref 0.1–0.9)
Monocytes: 4 %
Neutrophils Absolute: 7.3 10*3/uL — ABNORMAL HIGH (ref 1.4–7.0)
Neutrophils: 77 %
Platelets: 256 10*3/uL (ref 150–450)
RBC: 5.03 x10E6/uL (ref 3.77–5.28)
RDW: 12.2 % (ref 11.7–15.4)
WBC: 9.6 10*3/uL (ref 3.4–10.8)

## 2023-08-11 LAB — PROTIME-INR
INR: 1 (ref 0.9–1.2)
Prothrombin Time: 11.5 s (ref 9.1–12.0)

## 2023-08-11 LAB — TSH+T4F+T3FREE
Free T4: 0.94 ng/dL (ref 0.82–1.77)
T3, Free: 2.1 pg/mL (ref 2.0–4.4)
TSH: 3.21 u[IU]/mL (ref 0.450–4.500)

## 2023-08-31 ENCOUNTER — Telehealth: Payer: Managed Care, Other (non HMO) | Admitting: Physician Assistant

## 2023-08-31 DIAGNOSIS — B9689 Other specified bacterial agents as the cause of diseases classified elsewhere: Secondary | ICD-10-CM | POA: Diagnosis not present

## 2023-08-31 DIAGNOSIS — J069 Acute upper respiratory infection, unspecified: Secondary | ICD-10-CM

## 2023-08-31 MED ORDER — AZITHROMYCIN 250 MG PO TABS: ORAL_TABLET | ORAL | 0 refills | Status: AC

## 2023-08-31 NOTE — Progress Notes (Signed)

## 2023-10-11 ENCOUNTER — Other Ambulatory Visit: Payer: Self-pay | Admitting: Family Medicine

## 2023-10-11 DIAGNOSIS — F4321 Adjustment disorder with depressed mood: Secondary | ICD-10-CM

## 2023-10-11 DIAGNOSIS — F330 Major depressive disorder, recurrent, mild: Secondary | ICD-10-CM

## 2023-10-30 ENCOUNTER — Telehealth (INDEPENDENT_AMBULATORY_CARE_PROVIDER_SITE_OTHER): Admitting: Family Medicine

## 2023-10-30 ENCOUNTER — Ambulatory Visit: Payer: Self-pay

## 2023-10-30 ENCOUNTER — Ambulatory Visit: Admitting: Family Medicine

## 2023-10-30 VITALS — HR 102 | Temp 97.9°F

## 2023-10-30 DIAGNOSIS — Z8719 Personal history of other diseases of the digestive system: Secondary | ICD-10-CM

## 2023-10-30 DIAGNOSIS — U071 COVID-19: Secondary | ICD-10-CM | POA: Diagnosis not present

## 2023-10-30 DIAGNOSIS — Z9889 Other specified postprocedural states: Secondary | ICD-10-CM

## 2023-10-30 NOTE — Progress Notes (Signed)
 Pt reports that she tested positive for COVID last night she had been experiencing allergy sxs prior.  She has been taking dayquil

## 2023-10-30 NOTE — Progress Notes (Signed)
 Virtual Visit via Video Note  I connected with Laura [name] on 10/30/23 at  3:00 PM EDT by a video enabled telemedicine application and verified that I am speaking with the correct person using two identifiers.   I discussed the limitations of evaluation and management by telemedicine and the availability of in person appointments. The patient expressed understanding and agreed to proceed.  Patient location: at home Provider location: in office  Subjective:    CC:  No chief complaint on file.   HPI: She has been having abd pain. She was picking up something heavy and had a sudden pain or discomfort near the umbilicus it is close to where she had her hernia repaired in November 2022.  She has had a lot of soreness and pain . She also has COVID-19 right now.  She and her family were around some other family members last Friday her husband and daughter tested positive on Monday for COVID she just had a little bit of drainage and runny nose which she thought was just allergies but then started feeling worse yesterday.  She had tested initially and was negative but when she tested again yesterday she was positive.  No fever.  But she is coughing and has burning sensation in her eyes.  The cough is also bothering the abdominal pain and making that worse.  She has been taking some DayQuil.   Past medical history, Surgical history, Family history not pertinant except as noted below, Social history, Allergies, and medications have been entered into the medical record, reviewed, and corrections made.    Objective:    General: Speaking clearly in complete sentences without any shortness of breath.  Alert and oriented x3.  Normal judgment. No apparent acute distress.    Impression and Recommendations:    Problem List Items Addressed This Visit   None Visit Diagnoses       H/O hernia repair    -  Primary   Relevant Orders   Ambulatory referral to General Surgery     COVID-19           COVID-recommend symptomatic care.  Did offer Paxlovid if she would like to do so but declined.  Call if not feeling at least a little bit better after the weekend.  Abdominal pain in the area of her previous hernia repair-will refer back to general surgery for consultation they may opt to just monitor the area and see if the pain eventually improves and resolves she is not noticing any bulging.  Orders Placed This Encounter  Procedures   Ambulatory referral to General Surgery    Referral Priority:   Routine    Referral Type:   Surgical    Referral Reason:   Specialty Services Required    Requested Specialty:   General Surgery    Number of Visits Requested:   1    No orders of the defined types were placed in this encounter.    I discussed the assessment and treatment plan with the patient. The patient was provided an opportunity to ask questions and all were answered. The patient agreed with the plan and demonstrated an understanding of the instructions.   The patient was advised to call back or seek an in-person evaluation if the symptoms worsen or if the condition fails to improve as anticipated.  I spent 10 minutes on the day of the encounter to include pre-visit record review, face-to-face time with the patient and post visit ordering of test.  Nani Gasser, MD

## 2023-10-30 NOTE — Telephone Encounter (Signed)
 Chief Complaint: Shortness of Breath Symptoms: cough, HA, weakness, chills Frequency: Ongoing x 3 days Pertinent Negatives: Patient denies fever, N/V/D Disposition: [] ED /[] Urgent Care (no appt availability in office) / [x] Appointment(In office/virtual)/ []  [facility]/ [] Home Care/ [] Refused Recommended Disposition /[] [facility]/ []  Follow-up with PCP Additional Notes: Pt reports she was with family last Friday who have all been since diagnosed with Covid including her husband and daughter. Pt began with symptoms 3 days ago to include HA, non-productive cough, HA, weakness, chills. Pt reports SpO2 was down to 91% overnight, current reading 97%. Pt denies fever, N/V/D/ Pt reports her appt today was originally for abd pain due to what she believes is possibly a reoccurring hernia, denies protruding. Pt notes pain is worse with coughing. Advised to keep appt today due to symptoms. This RN educated pt on home care, new-worsening symptoms, when to call back/seek emergent care. Pt verbalized understanding and agrees to plan.    Copied from CRM [id number]. Topic: Clinical - Red Word Triage >> Oct 30, 2023  9:43 AM Alessandra Bevels wrote: Red Word that prompted transfer to Nurse Triage: Patient is calling to report that she tested positve for COVID -Reporting lung pain, short of breath, and eye pain Reason for Disposition  MILD difficulty breathing (e.g., minimal/no SOB at rest, SOB with walking, pulse <100)  Answer Assessment - Initial Assessment Questions 1. COVID-19 DIAGNOSIS: "How do you know that you have COVID?" (e.g., positive lab test or self-test, diagnosed by doctor or NP/PA, symptoms after exposure).     Took home test 2. COVID-19 EXPOSURE: "Was there any known exposure to COVID before the symptoms began?" CDC Definition of close contact: within 6 feet (2 meters) for a total of 15 minutes or more over a 24-hour period.      Pt was at a family gathering last Friday and everyone  has been positive she was around 3. ONSET: "When did the COVID-19 symptoms start?"      3 days ago 4. WORST SYMPTOM: "What is your worst symptom?" (e.g., cough, fever, shortness of breath, muscle aches)     Shortness of breath 5. COUGH: "Do you have a cough?" If Yes, ask: "How bad is the cough?"       Yes, "rough cough"  6. FEVER: "Do you have a fever?" If Yes, ask: "What is your temperature, how was it measured, and when did it start?"     None 7. RESPIRATORY STATUS: "Describe your breathing?" (e.g., normal; shortness of breath, wheezing, unable to speak)      SOB at rest, pt reports O2 was 91% overnight 8. BETTER-SAME-WORSE: "Are you getting better, staying the same or getting worse compared to yesterday?"  If getting worse, ask, "In what way?"     Worse 9. OTHER SYMPTOMS: "Do you have any other symptoms?"  (e.g., chills, fatigue, headache, loss of smell or taste, muscle pain, sore throat)     Chills, HA, blurry vision, "not a lot of drainage, crusty" 10. HIGH RISK DISEASE: "Do you have any chronic medical problems?" (e.g., asthma, heart or lung disease, weak immune system, obesity, etc.)       WPW, ablation 2012, A-fib 11. VACCINE: "Have you had the COVID-19 vaccine?" If Yes, ask: "Which one, how many shots, when did you get it?"       Yes 13. O2 SATURATION MONITOR:  "Do you use an oxygen saturation monitor (pulse oximeter) at home?" If Yes, ask "What is your reading (oxygen level) today?" "  What is your usual oxygen saturation reading?" (e.g., 95%)       97%  Protocols used: Coronavirus (COVID-19) Diagnosed or Suspected-A-AH

## 2023-11-01 ENCOUNTER — Other Ambulatory Visit: Payer: Self-pay | Admitting: Family Medicine

## 2023-11-01 DIAGNOSIS — J309 Allergic rhinitis, unspecified: Secondary | ICD-10-CM

## 2023-11-03 NOTE — Telephone Encounter (Signed)
 Patient seen 10/30/23 with DR. Metheney

## 2023-11-07 ENCOUNTER — Telehealth: Admitting: Physician Assistant

## 2023-11-07 DIAGNOSIS — S91341A Puncture wound with foreign body, right foot, initial encounter: Secondary | ICD-10-CM | POA: Diagnosis not present

## 2023-11-07 NOTE — Progress Notes (Signed)
 Virtual Visit Consent   Laura [name], you are scheduled for a virtual visit with a [facility] provider today. Just as with appointments in the office, your consent must be obtained to participate. Your consent will be active for this visit and any virtual visit you may have with one of our providers in the next 365 days. If you have a MyChart account, a copy of this consent can be sent to you electronically.  As this is a virtual visit, video technology does not allow for your provider to perform a traditional examination. This may limit your provider's ability to fully assess your condition. If your provider identifies any concerns that need to be evaluated in person or the need to arrange testing (such as labs, EKG, etc.), we will make arrangements to do so. Although advances in technology are sophisticated, we cannot ensure that it will always work on either your end or our end. If the connection with a video visit is poor, the visit may have to be switched to a telephone visit. With either a video or telephone visit, we are not always able to ensure that we have a secure connection.  By engaging in this virtual visit, you consent to the provision of healthcare and authorize for your insurance to be billed (if applicable) for the services provided during this visit. Depending on your insurance coverage, you may receive a charge related to this service.  I need to obtain your verbal consent now. Are you willing to proceed with your visit today? Laura [name] has provided verbal consent on 11/07/2023 for a virtual visit (video or telephone). Roney Jaffe, PA-C  Date: 11/07/2023 5:26 PM   Virtual Visit via Video Note   I, Laura [name], connected with  Laura [name]  ([id number], [birth year]) on 11/07/23 at  5:15 PM EDT by a video-enabled telemedicine application and verified that I am speaking with the correct person using two identifiers.  Location: Patient: Virtual Visit Location Patient:  Home Provider: Virtual Visit Location Provider: Home Office   I discussed the limitations of evaluation and management by telemedicine and the availability of in person appointments. The patient expressed understanding and agreed to proceed.    History of Present Illness: Laura [name] is a 53 y.o. who identifies as a female who was assigned female at birth,  presents after stepping on a 'furniture tack' while moving furniture. The tack, described as 'thick as a picture nail,' penetrated the sole of their shoe and entered the ball of their foot. The injury site is 'throbbing' and 'tender,' but only bled 'maybe a dime size.' They have been ambulating with a limp due to the discomfort. The patient's last tetanus shot was nearly ten years ago, and they are concerned about the need for a booster given the nature of the injury.  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   Migraine 04/28/2019   Situational depression 04/28/2019   Family history of colon cancer 01/19/2019   Hx of adenomatous colonic polyps 01/19/2019   Pain and swelling of right knee 12/20/2018   Intractable right heel pain 12/20/2018  Psychological factors affecting morbid obesity (HCC) 01/27/2018   Protein deficiency (HCC) 12/03/2017   Primary osteoarthritis of right hip 10/17/2016   Left ureteral stone 04/11/2016   Uterine fibroid 08/15/2015   Menorrhagia with irregular cycle 08/14/2015   Radiculitis of left cervical region 08/09/2015   Post-traumatic osteoarthritis of left knee 02/15/2015   History of bariatric surgery 02/06/2015   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   GERD (gastroesophageal reflux disease) 06/19/2014   History of continuous positive airway pressure (CPAP) therapy at home 06/19/2014   OSA on CPAP 06/19/2014   Snoring 05/11/2014   Mixed hyperlipidemia 04/10/2014   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   Vitamin D deficiency 01/11/2014   Heart palpitations 12/07/2013   Menopausal symptom 05/18/2012   Leg swelling 05/18/2012   History of kidney stones 01/28/2012   Left flank pain 01/28/2012   Microscopic hematuria 01/28/2012   Sebaceous hyperplasia 10/24/2011   BACK PAIN, THORACIC REGION 07/25/2009   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/19/2006   OBESITY, NOS 05/19/2006   Major depressive disorder, recurrent episode (HCC) 05/19/2006   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    Allergies:  Allergies  Allergen Reactions   Morphine Itching   Chromium Other (See Comments)    causes contact dermatitis   Neomycin     Blisters on hands   Penicillins Hives   Medications:  Current Outpatient Medications:    ALPRAZolam (XANAX) 0.5 MG tablet, Take 0.5-1 tablets (0.25-0.5 mg total) by mouth at bedtime as needed for anxiety., Disp: 20 tablet, Rfl: 0   BIOTIN 5000 PO, Take 5,000 mcg by mouth daily., Disp: , Rfl:    Calcium Carbonate (CALCIUM 600 PO), Take 600 mg by mouth daily., Disp: , Rfl:    Cholecalciferol 50 MCG (2000 UT) TABS, Take 2,000 Units by mouth daily., Disp: , Rfl:    clobetasol ointment (TEMOVATE) 0.05 %, Apply 1 application topically 2 (two) times daily as needed (eczema)., Disp: , Rfl:    dicyclomine (BENTYL) 20 MG tablet, Take 20 mg by mouth 3 (three) times daily., Disp: , Rfl: 2   escitalopram (LEXAPRO) 10 MG tablet, TAKE 1 TABLET BY MOUTH EVERY DAY, Disp: 90 tablet, Rfl: 1   famotidine (PEPCID) 20 MG tablet, Take 20 mg by mouth at bedtime as needed for heartburn or indigestion., Disp: , Rfl:    fluticasone (FLONASE) 50 MCG/ACT nasal spray, Place 2 sprays into both nostrils daily. (Patient taking  differently: Place 2 sprays into both nostrils daily as needed for allergies.), Disp: 16 g, Rfl: 6   ipratropium (ATROVENT) 0.06 % nasal spray, INSTILL 2 SPRAYS INTO BOTH NOSTRILS FOUR TIMES A DAY, Disp: 45 mL, Rfl: 1   levalbuterol (XOPENEX HFA) 45 MCG/ACT inhaler, Inhale 1-2 puffs into the lungs every 6 (six) hours as needed for wheezing., Disp: 15 g, Rfl: 0   omeprazole (PRILOSEC) 40 MG capsule, Take 40 mg by mouth in the morning and at bedtime., Disp: , Rfl:    ondansetron (ZOFRAN) 4 MG tablet, Take 1 tablet (4 mg total) by mouth every 8 (eight) hours as needed for nausea or vomiting., Disp: 10 tablet, Rfl: 0   Prenatal Vit-Fe Fumarate-FA (PRENATAL VITAMIN PO), Take 1 tablet by mouth daily., Disp: , Rfl:    rizatriptan (MAXALT-MLT) 10 MG disintegrating tablet, Take 1 tablet (10 mg total) by mouth as needed for migraine. May repeat in 2 hours if needed, Disp: 10 tablet, Rfl: 5   topiramate (TOPAMAX) 100 MG tablet, TAKE 1 TABLET BY MOUTH TWICE A DAY, Disp: 180 tablet, Rfl:  1   traMADol (ULTRAM) 50 MG tablet, Take 1 tablet (50 mg total) by mouth 2 (two) times daily as needed for moderate pain., Disp: 60 tablet, Rfl: 0  Observations/Objective: Patient is well-developed, well-nourished in no acute distress.  Resting comfortably  at home.  Head is normocephalic, atraumatic.  No labored breathing.  Speech is clear and coherent with logical content.  Patient is alert and oriented at baseline.    Assessment and Plan: 1. Penetrating foreign body of skin of right foot, initial encounter (Primary)   Puncture wound of the foot Sustained a puncture wound to the ball of the foot from stepping on a furniture tack. The injury is tender and throbbing, with minimal bleeding. The last tetanus immunization was nearly ten years ago, necessitating evaluation for tetanus prophylaxis.  - Refer to urgent care for evaluation and possible tetanus booster - Advise thorough wound cleaning  Follow Up  Instructions: I discussed the assessment and treatment plan with the patient. The patient was provided an opportunity to ask questions and all were answered. The patient agreed with the plan and demonstrated an understanding of the instructions.  A copy of instructions were sent to the patient via MyChart unless otherwise noted below.     The patient was advised to call back or seek an in-person evaluation if the symptoms worsen or if the condition fails to improve as anticipated.    Kasandra Knudsen Mayers, PA-C

## 2023-11-07 NOTE — Patient Instructions (Signed)
 Laura [name], thank you for joining Roney Jaffe, PA-C for today's virtual visit.  While this provider is not your primary care provider (PCP), if your PCP is located in our provider database this encounter information will be shared with them immediately following your visit.   A [facility] MyChart account gives you access to today's visit and all your visits, tests, and labs performed at [facility] " click here if you don't have a [facility] MyChart account or go to [email or web address]  Consent: (Patient) Laura [name] provided verbal consent for this virtual visit at the beginning of the encounter.  Current Medications:  Current Outpatient Medications:    ALPRAZolam (XANAX) 0.5 MG tablet, Take 0.5-1 tablets (0.25-0.5 mg total) by mouth at bedtime as needed for anxiety., Disp: 20 tablet, Rfl: 0   BIOTIN 5000 PO, Take 5,000 mcg by mouth daily., Disp: , Rfl:    Calcium Carbonate (CALCIUM 600 PO), Take 600 mg by mouth daily., Disp: , Rfl:    Cholecalciferol 50 MCG (2000 UT) TABS, Take 2,000 Units by mouth daily., Disp: , Rfl:    clobetasol ointment (TEMOVATE) 0.05 %, Apply 1 application topically 2 (two) times daily as needed (eczema)., Disp: , Rfl:    dicyclomine (BENTYL) 20 MG tablet, Take 20 mg by mouth 3 (three) times daily., Disp: , Rfl: 2   escitalopram (LEXAPRO) 10 MG tablet, TAKE 1 TABLET BY MOUTH EVERY DAY, Disp: 90 tablet, Rfl: 1   famotidine (PEPCID) 20 MG tablet, Take 20 mg by mouth at bedtime as needed for heartburn or indigestion., Disp: , Rfl:    fluticasone (FLONASE) 50 MCG/ACT nasal spray, Place 2 sprays into both nostrils daily. (Patient taking differently: Place 2 sprays into both nostrils daily as needed for allergies.), Disp: 16 g, Rfl: 6   ipratropium (ATROVENT) 0.06 % nasal spray, INSTILL 2 SPRAYS INTO BOTH NOSTRILS FOUR TIMES A DAY, Disp: 45 mL, Rfl: 1   levalbuterol (XOPENEX HFA) 45 MCG/ACT inhaler, Inhale 1-2 puffs into the lungs every 6 (six)  hours as needed for wheezing., Disp: 15 g, Rfl: 0   omeprazole (PRILOSEC) 40 MG capsule, Take 40 mg by mouth in the morning and at bedtime., Disp: , Rfl:    ondansetron (ZOFRAN) 4 MG tablet, Take 1 tablet (4 mg total) by mouth every 8 (eight) hours as needed for nausea or vomiting., Disp: 10 tablet, Rfl: 0   Prenatal Vit-Fe Fumarate-FA (PRENATAL VITAMIN PO), Take 1 tablet by mouth daily., Disp: , Rfl:    rizatriptan (MAXALT-MLT) 10 MG disintegrating tablet, Take 1 tablet (10 mg total) by mouth as needed for migraine. May repeat in 2 hours if needed, Disp: 10 tablet, Rfl: 5   topiramate (TOPAMAX) 100 MG tablet, TAKE 1 TABLET BY MOUTH TWICE A DAY, Disp: 180 tablet, Rfl: 1   traMADol (ULTRAM) 50 MG tablet, Take 1 tablet (50 mg total) by mouth 2 (two) times daily as needed for moderate pain., Disp: 60 tablet, Rfl: 0   Medications ordered in this encounter:  No orders of the defined types were placed in this encounter.    *If you need refills on other medications prior to your next appointment, please contact your pharmacy*  Follow-Up: Call back or seek an in-person evaluation if the symptoms worsen or if the condition fails to improve as anticipated.  [facility] [phone]  Other Instructions Puncture Wound A puncture wound is an injury that is caused by a sharp, thin object that penetrates your skin. Usually,  a puncture wound does not leave a large opening in your skin, so it may not bleed a lot. However, when you get a puncture wound, dirt or other materials (foreign bodies) can be forced into your wound and can break off inside. This increases the chance of infection, such as tetanus. There are many sharp, pointed objects that can cause puncture wounds, including teeth, nails, splinters of glass, fishhooks, and needles. Treatment may include washing out the wound with a germ-free (sterile) salt-water solution and having the wound opened surgically to remove a foreign object.  The wound may be closed with stitches (sutures), skin glue, or adhesive strips and covered with antibiotic ointment and a bandage (dressing). Depending on what caused the injury, you may also need a tetanus shot or a rabies shot. Follow these instructions at home: Medicines Take or apply over-the-counter and prescription medicines only as told by your health care provider. If you were prescribed antibiotics, take or apply them as told by your health care provider. Do not stop using the antibiotic even if you start to feel better. Bathing Keep the dressing clean and dry as told by your health care provider. Do not take baths, swim, or use a hot tub until your health care provider approves. Ask your health care provider if you may take showers. You may only be allowed to take sponge baths. Wound care  Follow instructions from your health care provider about how to take care of your wound. Make sure you: Wash your hands with soap and water for at least 20 seconds before and after you change your dressing. If soap and water are not available, use hand sanitizer. Change your dressing as told by your health care provider. Leave sutures, skin glue, or adhesive strips in place. These skin closures may need to stay in place for 2 weeks or longer. If adhesive strip edges start to loosen and curl up, you may trim the loose edges. Do not remove adhesive strips completely unless your health care provider tells you to do that. Clean the wound as told by your health care provider. Do not scratch or pick at the wound. Check your wound every day for signs of infection. Check for: Redness, swelling, or pain. Fluid or blood. Warmth. Pus or a bad smell. General instructions Raise (elevate) the injured area above the level of your heart while you are sitting or lying down. If your puncture wound is in your foot, ask your health care provider if you need to avoid putting weight on your foot and for how long. Do not  use the injured limb to support your body weight until your health care provider says that you can. Use crutches as told by your health care provider. Keep all follow-up visits. This is important. Contact a health care provider if: You received a tetanus or rabies shot and you have swelling, severe pain, redness, or bleeding at the injection site. You have any of these signs of wound infection: Redness, swelling, or pain. Fluid or blood. Warmth. Pus or a bad smell. Your sutures come out. You notice something coming out of your wound, such as wood or glass. Your pain is not controlled with medicine. You develop numbness around your wound. Get help right away if: You develop severe swelling around your wound. You have a red streak going away from your wound. You develop painful skin lumps. The wound is on your hand or foot and you: Cannot properly move a finger or toe. Notice that  your fingers or toes look pale or bluish. Summary A puncture wound is an injury that is caused by a sharp, thin object that penetrates your skin. Treatment may include washing out the wound and having the wound opened surgically to remove a foreign object, The wound may be closed with stitches (sutures), skin glue, or adhesive strips and covered with antibiotic ointment and a bandage (dressing). Follow instructions from your health care provider about how to take care of your wound. Contact a health care provider if you have redness, swelling, or pain at the site of your wound. This information is not intended to replace advice given to you by your health care provider. Make sure you discuss any questions you have with your health care provider. Document Revised: 08/28/2021 Document Reviewed: 08/28/2021 Elsevier Patient Education  2024 [employer]   If you have been instructed to have an in-person evaluation today at a local Urgent Care facility, please use the link below. It will take you to a list of all  of our available [facility], including address, phone number and hours of operation. Please do not delay care.  [facility]  If you or a family member do not have a primary care provider, use the link below to schedule a visit and establish care. When you choose a [facility] primary care physician or advanced practice provider, you gain a long-term partner in health. Find a Primary Care Provider  Learn more about [facility]'s in-office and virtual care options: [facility] - Get Care Now

## 2023-11-08 ENCOUNTER — Ambulatory Visit
Admission: RE | Admit: 2023-11-08 | Discharge: 2023-11-08 | Disposition: A | Discharge status: Home / Self Care | Source: Ambulatory Visit | Attending: Family Medicine | Admitting: Family Medicine

## 2023-11-08 VITALS — BP 122/81 | HR 68 | Temp 97.8°F | Resp 18

## 2023-11-08 DIAGNOSIS — Z23 Encounter for immunization: Secondary | ICD-10-CM | POA: Diagnosis not present

## 2023-11-08 DIAGNOSIS — S91339A Puncture wound without foreign body, unspecified foot, initial encounter: Secondary | ICD-10-CM

## 2023-11-08 MED ORDER — TETANUS-DIPHTH-ACELL PERTUSSIS 5-2.5-18.5 LF-MCG/0.5 IM SUSY
0.5000 mL | PREFILLED_SYRINGE | Freq: Once | INTRAMUSCULAR | Status: AC
  Administered 2023-11-08: 0.5 mL via INTRAMUSCULAR

## 2023-11-08 NOTE — ED Triage Notes (Signed)
 Patient states that she stepped on a furniture tack yesterday with her right foot.  The foot is sore, tender, some swelling.  Patient does need a Tdap.

## 2023-11-08 NOTE — Discharge Instructions (Signed)
 Watch area for signs of infection Make sure you move your arm frequently to prevent soreness Call return for problems

## 2023-11-08 NOTE — ED Provider Notes (Signed)
 [facility]    CSN: [id number] Arrival date & time: 11/08/23  1111      History   Chief Complaint Chief Complaint  Patient presents with   Puncture Wound    Stepped on furniture tack. Need Tetanus due November 25. Foot is sore minor swelling at the site. Improved with ice. - Entered by patient    HPI Laura [name] is a 53 y.o. female.   HPI  See above chief complaint.  Patient stepped on a furniture check yesterday and has a small wound on her foot.  She washed it thoroughly.  It is not infected.  She is here requesting a tetanus booster  Past Medical History:  Diagnosis Date   Allergic rhinitis    Arthritis    Depression    Dyslipidemia    Dysmenorrhea    Family history of adverse reaction to anesthesia    mother slow to wake up   Gall bladder disease    GERD (gastroesophageal reflux disease)    Headache    History of kidney stones    Infertility, female    Obesity    Pneumonia    Polycystic ovary disease    PONV (postoperative nausea and vomiting)    Sebaceous hyperplasia     on tretinoin   Sleep apnea    cpap   WPW (Wolff-Parkinson-White syndrome)    hx of ablation 2012    Patient Active Problem List   Diagnosis Date Noted   Abnormal serum thyroxine (T4) level 08/10/2023   Hernia of abdominal wall 03/23/2023   Arthritis of right acromioclavicular joint 02/09/2023   Irregular periods 04/30/2022   Right upper quadrant pain 08/26/2021   Incisional hernia 07/01/2021   H/O metal allergy 05/01/2020   Insomnia 05/01/2020   Iron deficiency 10/26/2019   Hx of gastric bypass 06/29/2019   Migraine 04/28/2019   Situational depression 04/28/2019   Family history of colon cancer 01/19/2019   Hx of adenomatous colonic polyps 01/19/2019   Pain and swelling of right knee 12/20/2018   Intractable right heel pain 12/20/2018   Psychological factors affecting morbid obesity (HCC) 01/27/2018   Protein deficiency (HCC) 12/03/2017   Primary osteoarthritis  of right hip 10/17/2016   Left ureteral stone 04/11/2016   Uterine fibroid 08/15/2015   Menorrhagia with irregular cycle 08/14/2015   Radiculitis of left cervical region 08/09/2015   Post-traumatic osteoarthritis of left knee 02/15/2015   History of bariatric surgery 02/06/2015   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   GERD (gastroesophageal reflux disease) 06/19/2014   History of continuous positive airway pressure (CPAP) therapy at home 06/19/2014   OSA on CPAP 06/19/2014   Snoring 05/11/2014   Mixed hyperlipidemia 04/10/2014   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   Vitamin D deficiency 01/11/2014   Heart palpitations 12/07/2013   Menopausal symptom 05/18/2012   Leg swelling 05/18/2012   History of kidney stones 01/28/2012   Left flank pain 01/28/2012   Microscopic hematuria 01/28/2012   Sebaceous hyperplasia 10/24/2011   BACK PAIN, THORACIC REGION 07/25/2009   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/19/2006   OBESITY, NOS 05/19/2006   Major depressive disorder, recurrent episode (HCC) 05/19/2006   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    Past Surgical History:  Procedure Laterality Date   CESAREAN SECTION     x 2   gastric bypass surgery      INCISIONAL HERNIA REPAIR N/A 07/01/2021   Procedure: INCISIONAL HERNIA REPAIR WITH MESH;  Surgeon: Kinsinger, De Blanch, MD;  Location: [facility];  Service: General;  Laterality: N/A;   LAPAROSCOPIC CHOLECYSTECTOMY     left foot surgery      SLEEVE GASTROPLASTY     SUPRAVENTRICULAR TACHYCARDIA ABLATION N/A 07/23/2011   Procedure: SUPRAVENTRICULAR TACHYCARDIA ABLATION;  Surgeon: Marinus Maw, MD;  Location: [facility] CATH LAB;  Service: Cardiovascular;  Laterality: N/A;   transthoracic echcardiogram  09/15/2006    OB History   No obstetric history on file.      Home Medications    Prior to Admission medications   Medication Sig Start Date End Date Taking? Authorizing Provider  ALPRAZolam Prudy Feeler) 0.5 MG tablet Take 0.5-1 tablets (0.25-0.5  mg total) by mouth at bedtime as needed for anxiety. 08/10/23  Yes Agapito Games, MD  BIOTIN 5000 PO Take 5,000 mcg by mouth daily.   Yes [provider]  Calcium Carbonate (CALCIUM 600 PO) Take 600 mg by mouth daily.   Yes [provider]  Cholecalciferol 50 MCG (2000 UT) TABS Take 2,000 Units by mouth daily.   Yes [provider]  clobetasol ointment (TEMOVATE) 0.05 % Apply 1 application topically 2 (two) times daily as needed (eczema).   Yes [provider]  dicyclomine (BENTYL) 20 MG tablet Take 20 mg by mouth 3 (three) times daily. 10/18/21  Yes Edwin Dada C, PA  escitalopram (LEXAPRO) 10 MG tablet TAKE 1 TABLET BY MOUTH EVERY DAY 10/12/23  Yes Agapito Games, MD  famotidine (PEPCID) 20 MG tablet Take 20 mg by mouth at bedtime as needed for heartburn or indigestion.   Yes [provider]  fluticasone (FLONASE) 50 MCG/ACT nasal spray Place 2 sprays into both nostrils daily. Patient taking differently: Place 2 sprays into both nostrils daily as needed for allergies. 03/11/21  Yes Joycelyn Man M, PA-C  ipratropium (ATROVENT) 0.06 % nasal spray INSTILL 2 SPRAYS INTO BOTH NOSTRILS FOUR TIMES A DAY 11/02/23  Yes Agapito Games, MD  levalbuterol [facility] HFA) 45 MCG/ACT inhaler Inhale 1-2 puffs into the lungs every 6 (six) hours as needed for wheezing. 10/01/22  Yes Breeback, Jade L, PA-C  omeprazole (PRILOSEC) 40 MG capsule Take 40 mg by mouth in the morning and at bedtime.   Yes [provider]  ondansetron (ZOFRAN) 4 MG tablet Take 1 tablet (4 mg total) by mouth every 8 (eight) hours as needed for nausea or vomiting. 08/10/23  Yes Agapito Games, MD  Prenatal Vit-Fe Fumarate-FA (PRENATAL VITAMIN PO) Take 1 tablet by mouth daily.   Yes [provider]  rizatriptan (MAXALT-MLT) 10 MG disintegrating tablet Take 1 tablet (10 mg total) by mouth as needed for migraine. May repeat in 2 hours if needed 09/05/22  Yes  Agapito Games, MD  topiramate (TOPAMAX) 100 MG tablet TAKE 1 TABLET BY MOUTH TWICE A DAY 06/22/23  Yes Agapito Games, MD  traMADol (ULTRAM) 50 MG tablet Take 1 tablet (50 mg total) by mouth 2 (two) times daily as needed for moderate pain. 03/23/23  Yes Monica Becton, MD    Family History Family History  Problem Relation Age of Onset   Colon cancer Mother 30   Breast cancer Mother    Diabetes Mother    Heart attack Mother        AMI, in late 72's   Depression Mother    Heart disease Mother    Hypertension Father    Thyroid disease Brother    Thyroid disease Sister     Social History Social History   Tobacco Use   Smoking status:  Former    Current packs/day: 0.00    Types: Cigarettes    Quit date: 07/15/2002    Years since quitting: 21.3   Smokeless tobacco: Never  Vaping Use   Vaping status: Never Used  Substance Use Topics   Alcohol use: Never   Drug use: Never     Allergies   Morphine, Chromium, Neomycin, and Penicillins   Review of Systems Review of Systems  See HPI Physical Exam Triage Vital Signs ED Triage Vitals  Encounter Vitals Group     BP 11/08/23 1118 122/81     Systolic BP Percentile --      Diastolic BP Percentile --      Pulse Rate 11/08/23 1118 68     Resp 11/08/23 1118 18     Temp 11/08/23 1118 97.8 F (36.6 C)     Temp Source 11/08/23 1118 Oral     SpO2 11/08/23 1118 96 %     Weight --      Height --      Head Circumference --      Peak Flow --      Pain Score 11/08/23 1120 2     Pain Loc --      Pain Education --      Exclude from Growth Chart --    No data found.  Updated Vital Signs BP 122/81 (BP Location: Right Arm)   Pulse 68   Temp 97.8 F (36.6 C) (Oral)   Resp 18   LMP 02/12/2019   SpO2 96%      Physical Exam Constitutional:      General: She is not in acute distress.    Appearance: She is well-developed.  HENT:     Head: Normocephalic and atraumatic.  Eyes:     Conjunctiva/sclera:  Conjunctivae normal.     Pupils: Pupils are equal, round, and reactive to light.  Cardiovascular:     Rate and Rhythm: Normal rate.  Pulmonary:     Effort: Pulmonary effort is normal. No respiratory distress.  Musculoskeletal:        General: Normal range of motion.     Cervical back: Normal range of motion.       Feet:  Skin:    General: Skin is warm and dry.  Neurological:     Mental Status: She is alert.      UC Treatments / Results  Labs (all labs ordered are listed, but only abnormal results are displayed) Labs Reviewed - No data to display  EKG   Radiology No results found.  Procedures Procedures (including critical care time)  Medications Ordered in UC Medications  Tdap (BOOSTRIX) injection 0.5 mL (0.5 mLs Intramuscular Given 11/08/23 1129)    Initial Impression / Assessment and Plan / UC Course  I have reviewed the triage vital signs and the nursing notes.  Pertinent labs & imaging results that were available during my care of the patient were reviewed by me and considered in my medical decision making (see chart for details).     Last tetanus is in November in 2015.  She is doing a couple months.  She desires a booster today.  This is provided.  Wound care discussed Final Clinical Impressions(s) / UC Diagnoses   Final diagnoses:  Need for prophylactic vaccination with combined diphtheria-tetanus-pertussis (DTP) vaccine  Puncture wound of foot, unspecified laterality, initial encounter     Discharge Instructions      Watch area for signs of infection Make sure you move  your arm frequently to prevent soreness Call return for problems     ED Prescriptions   None    PDMP not reviewed this encounter.   Eustace Moore, MD 11/08/23 1132    Eustace Moore, MD 11/11/23 [phone]

## 2023-12-01 ENCOUNTER — Ambulatory Visit: Payer: Self-pay

## 2023-12-01 NOTE — Telephone Encounter (Signed)
 Patient scheduled for 12/02/23 with Sandy Crumb, PA

## 2023-12-01 NOTE — Telephone Encounter (Signed)
 Copied from CRM [id number]. Topic: Clinical - Red Word Triage >> Dec 01, 2023  2:03 PM Danelle Dunning F wrote: [facility] that prompted transfer to Nurse Triage: ankle pain;   Scale 1-10: The pain comes and goes but when it comes on she said its a 8. Patient stated that it feels like she needs to crack the joint but it won't crack. The patient has experienced the pain alternating between both her ankles.   Chief Complaint: Ankle Pain Symptoms: Pain, Swelling Frequency: One month Pertinent Negatives: Patient denies fever, discoloration, numbness. Disposition: [] ED /[] Urgent Care (no appt availability in office) / [x] Appointment(In office/virtual)/ []  [facility]/ [] Home Care/ [] Refused Recommended Disposition /[] [place] [facility]/ []  Follow-up with PCP Additional Notes: LS is being triaged for ankle pain that is bilateral and has been present for a month. The patient states the pain alternates ankles and sometimes has some swelling. In office appointment made for tomorrow.   The patient had COVID a month ago.   Reason for Disposition  [1] Very swollen joint AND [2] no fever  Answer Assessment - Initial Assessment Questions 1. ONSET: "When did the pain start?"      One Month now  2. LOCATION: "Where is the pain located?"      Left and Right Ankle  3. PAIN: "How bad is the pain?"    (Scale 1-10; or mild, moderate, severe)  - MILD (1-3): doesn't interfere with normal activities.   - MODERATE (4-7): interferes with normal activities (e.g., work or school) or awakens from sleep, limping.   - SEVERE (8-10): excruciating pain, unable to do any normal activities, unable to walk.      Severe  4. WORK OR EXERCISE: "Has there been any recent work or exercise that involved this part of the body?"      No  5. CAUSE: "What do you think is causing the ankle pain?"     Unsure  6. OTHER SYMPTOMS: "Do you have any other symptoms?" (e.g., calf pain, rash, fever, swelling)     Swelling,    7. PREGNANCY: "Is there any chance you are pregnant?" "When was your last menstrual period?"     No and NO  Protocols used: Ankle Pain-A-AH

## 2023-12-02 ENCOUNTER — Ambulatory Visit: Admitting: Physician Assistant

## 2023-12-02 ENCOUNTER — Encounter: Payer: Self-pay | Admitting: Physician Assistant

## 2023-12-02 VITALS — BP 116/62 | HR 72 | Ht 64.0 in | Wt 221.0 lb

## 2023-12-02 DIAGNOSIS — Z8616 Personal history of COVID-19: Secondary | ICD-10-CM

## 2023-12-02 DIAGNOSIS — M2550 Pain in unspecified joint: Secondary | ICD-10-CM | POA: Diagnosis not present

## 2023-12-02 MED ORDER — KETOROLAC TROMETHAMINE 60 MG/2ML IM SOLN
60.0000 mg | Freq: Once | INTRAMUSCULAR | Status: AC
  Administered 2023-12-02: 60 mg via INTRAMUSCULAR

## 2023-12-02 MED ORDER — METHYLPREDNISOLONE 4 MG PO TBPK: ORAL_TABLET | ORAL | 0 refills | Status: DC

## 2023-12-02 NOTE — Progress Notes (Signed)
   Acute Office Visit  Subjective:     Patient ID: Laura [name], female    DOB: 01/30/1971, 53 y.o.   MRN: [id number]  Chief Complaint  Patient presents with   Ankle Pain    bilateral    HPI Patient is in today for multiple joints that are painful and achy after covid infection at beginning of April. She has had covid 3 times and all been really hard to get over every time. She has had similar issues with joint pain after. She did not have antiviral to most recent covid infection. She was not able to get out of bed for the first 28 hours. She has taken tramadol  which helps some but makes her sleepy. No hx of autoimmune diseases. She denies any swelling of joints but they are very achy. The worse being her ankles right now but can jump around. 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 She has hx of gastric surgery and cannot take NSAIDs. She takes tylenol  for pain that is minimally helping this pain. Rates 8/10 on pain score.     ROS See HPI.      Objective:    BP 116/62   Pulse 72   Ht 5\' 4"  (1.626 m)   Wt 221 lb (100.2 kg)   LMP 02/12/2019   SpO2 100%   BMI 37.93 kg/m  BP Readings from Last 3 Encounters:  12/02/23 116/62  11/08/23 122/81  08/10/23 (!) 111/54   Wt Readings from Last 3 Encounters:  12/02/23 221 lb (100.2 kg)  08/10/23 221 lb (100.2 kg)  04/06/23 222 lb 1.9 oz (100.8 kg)      Physical Exam Constitutional:      Appearance: Normal appearance. She is obese.  HENT:     Head: Normocephalic.  Cardiovascular:     Rate and Rhythm: Normal rate and regular rhythm.  Pulmonary:     Effort: Pulmonary effort is normal.  Musculoskeletal:     Right lower leg: No edema.     Left lower leg: No edema.  Neurological:     General: No focal deficit present.     Mental Status: She is alert and oriented to person, place, and time.  Psychiatric:        Mood and Affect: Mood normal.          Assessment & Plan:  Aaron AasAaron AasJara was seen today  for ankle pain.  Diagnoses and all orders for this visit:  Arthralgia of multiple joints -     methylPREDNISolone  (MEDROL  DOSEPAK) 4 MG TBPK tablet; Take as directed by package insert. -     CBC w/Diff/Platelet -     C-reactive protein -     CMP14+EGFR -     ketorolac  (TORADOL ) injection 60 mg  History of COVID-19 -     CBC w/Diff/Platelet -     C-reactive protein -     CMP14+EGFR   Post covid inflammation and pain otherwise stable Will check CRP,CMP, CBC Toradol  given IM today and sent home with medrol  dose pack Follow up as needed Considering icing joints that are inflammed as well as taking tylenol   Sandy Crumb, PA-C

## 2023-12-02 NOTE — Patient Instructions (Signed)
 Toradol  given in office today for pain. Don't take anymore anti-inflammatories.  Start medrol  dose pack  Joint Pain  Joint pain can be caused by many things. It may go away if you follow instructions from your health care provider for taking care of yourself at home. Sometimes, you may need more treatment. Joint pain can be caused by: Bruises at the area of the joint. An injury caused by movements that are repeated. Wear and tear on the joint as you get older. Buildup of uric acid crystals in the joint. This is also called gout. Irritation and swelling of the joint. Types of arthritis. Infections of the joint or of the bone. Your provider may tell you to take pain medicine or wear an elastic bandage, sling, or splint. If your joint pain continues, you may need lab or imaging tests to find the cause of your joint pain. Follow these instructions at home: If you have an elastic bandage, sling, or splint that can be taken off: Wear the bandage, sling, or splint as told by your provider. Take it off only if your provider says you can. Check the skin under and around it every day. Tell your provider if you see problems. Loosen it if your fingers or toes tingle, are numb, or turn cold and blue. Keep it clean and dry. Ask your provider if you should remove it before bathing. If the bandage, sling, or splint is not waterproof: Do not let it get wet. Cover it when you take a bath or shower. Use a cover that does not let any water in. Managing pain, stiffness, and swelling     If told, put ice on the area. If you have an elastic bandage, sling, or splint that you can take off, remove it as told. Put ice in a plastic bag. Place a towel between your skin and the bag. Leave the ice on for 20 minutes, 2-3 times a day. If told, put heat on the area. Do this as often as told. Use the heat source that your provider recommends, such as a moist heat pack or a heating pad. Place a towel between your  skin and the heat source. Leave the heat on for 20-30 minutes. If your skin turns bright red, take off the ice or heat right away to prevent skin damage. The risk of damage is higher if you can't feel pain, heat, or cold. Move your fingers or toes often to reduce stiffness and swelling. Raise the injured area above the level of your heart while you're sitting or lying down. Use a pillow to support the painful area as needed. Activity Rest the painful joint as told. Do not do things that cause pain or make pain worse. Begin exercising or stretching the affected area as told by your provider. Return to normal activities when you are told. Ask what things are safe for you to do. General instructions Take your medicines as told by your provider. Treatment may include medicines for pain and swelling that are taken by mouth or applied to the skin. Do not smoke, vape, or use products with nicotine or tobacco in them. If you need help quitting, talk with your provider. Keep all follow-up visits. Your provider will want to check on your condition. Contact a health care provider if: You have pain that does not get better with medicine. Your joint pain does not improve within 3 days. You have more bruising or swelling. You have a fever. You lose 10 lb (4.5  kg) or more without trying. Get help right away if: You cannot move the joint. Your fingers or toes tingle, become numb, or turn cold and blue. You have a fever along with a joint that's red, warm, and swollen. This information is not intended to replace advice given to you by your health care provider. Make sure you discuss any questions you have with your health care provider. Document Revised: 04/30/2023 Document Reviewed: 10/10/2022 Elsevier Patient Education  2024 [employer].

## 2023-12-03 LAB — CBC WITH DIFFERENTIAL/PLATELET
Basophils Absolute: 0 10*3/uL (ref 0.0–0.2)
Basos: 0 %
EOS (ABSOLUTE): 0.1 10*3/uL (ref 0.0–0.4)
Eos: 1 %
Hematocrit: 44.9 % (ref 34.0–46.6)
Hemoglobin: 15.1 g/dL (ref 11.1–15.9)
Immature Grans (Abs): 0 10*3/uL (ref 0.0–0.1)
Immature Granulocytes: 0 %
Lymphocytes Absolute: 2.3 10*3/uL (ref 0.7–3.1)
Lymphs: 32 %
MCH: 32.4 pg (ref 26.6–33.0)
MCHC: 33.6 g/dL (ref 31.5–35.7)
MCV: 96 fL (ref 79–97)
Monocytes Absolute: 0.5 10*3/uL (ref 0.1–0.9)
Monocytes: 6 %
Neutrophils Absolute: 4.3 10*3/uL (ref 1.4–7.0)
Neutrophils: 61 %
Platelets: 213 10*3/uL (ref 150–450)
RBC: 4.66 x10E6/uL (ref 3.77–5.28)
RDW: 12.1 % (ref 11.7–15.4)
WBC: 7.2 10*3/uL (ref 3.4–10.8)

## 2023-12-03 LAB — C-REACTIVE PROTEIN: CRP: 2 mg/L (ref 0–10)

## 2023-12-03 LAB — CMP14+EGFR
ALT: 29 IU/L (ref 0–32)
AST: 27 IU/L (ref 0–40)
Albumin: 3.8 g/dL (ref 3.8–4.9)
Alkaline Phosphatase: 130 IU/L — ABNORMAL HIGH (ref 44–121)
BUN/Creatinine Ratio: 10 (ref 9–23)
BUN: 8 mg/dL (ref 6–24)
Bilirubin Total: 0.2 mg/dL (ref 0.0–1.2)
CO2: 22 mmol/L (ref 20–29)
Calcium: 8.5 mg/dL — ABNORMAL LOW (ref 8.7–10.2)
Chloride: 110 mmol/L — ABNORMAL HIGH (ref 96–106)
Creatinine, Ser: 0.79 mg/dL (ref 0.57–1.00)
Globulin, Total: 2.1 g/dL (ref 1.5–4.5)
Glucose: 58 mg/dL — ABNORMAL LOW (ref 70–99)
Potassium: 4.2 mmol/L (ref 3.5–5.2)
Sodium: 145 mmol/L — ABNORMAL HIGH (ref 134–144)
Total Protein: 5.9 g/dL — ABNORMAL LOW (ref 6.0–8.5)
eGFR: 89 mL/min/{1.73_m2} (ref >59)

## 2023-12-04 ENCOUNTER — Encounter: Payer: Self-pay | Admitting: Physician Assistant

## 2023-12-04 NOTE — Progress Notes (Signed)
 No acute findings or evidence of inflammation. How are you feeling?  Sodium just a little high. Make sure drinking enough water.

## 2023-12-07 ENCOUNTER — Encounter: Payer: Self-pay | Admitting: Physician Assistant

## 2023-12-07 ENCOUNTER — Other Ambulatory Visit: Payer: Self-pay | Admitting: Physician Assistant

## 2023-12-17 ENCOUNTER — Other Ambulatory Visit: Payer: Self-pay | Admitting: General Surgery

## 2023-12-17 DIAGNOSIS — Z9884 Bariatric surgery status: Secondary | ICD-10-CM

## 2023-12-17 DIAGNOSIS — Z8719 Personal history of other diseases of the digestive system: Secondary | ICD-10-CM

## 2024-01-01 ENCOUNTER — Ambulatory Visit
Admission: RE | Admit: 2024-01-01 | Discharge: 2024-01-01 | Disposition: A | Discharge status: Home / Self Care | Source: Ambulatory Visit | Attending: General Surgery | Admitting: General Surgery

## 2024-01-01 DIAGNOSIS — Z9884 Bariatric surgery status: Secondary | ICD-10-CM

## 2024-01-01 DIAGNOSIS — Z8719 Personal history of other diseases of the digestive system: Secondary | ICD-10-CM

## 2024-01-01 MED ORDER — IOPAMIDOL (ISOVUE-300) INJECTION 61%
100.0000 mL | Freq: Once | INTRAVENOUS | Status: AC | PRN
  Administered 2024-01-01: 100 mL via INTRAVENOUS

## 2024-01-19 ENCOUNTER — Ambulatory Visit (INDEPENDENT_AMBULATORY_CARE_PROVIDER_SITE_OTHER): Admitting: Family Medicine

## 2024-01-19 ENCOUNTER — Ambulatory Visit

## 2024-01-19 ENCOUNTER — Ambulatory Visit: Payer: Self-pay

## 2024-01-19 ENCOUNTER — Encounter: Payer: Self-pay | Admitting: Family Medicine

## 2024-01-19 VITALS — BP 115/71 | HR 51 | Ht 64.0 in | Wt 220.0 lb

## 2024-01-19 DIAGNOSIS — S299XXA Unspecified injury of thorax, initial encounter: Secondary | ICD-10-CM

## 2024-01-19 MED ORDER — HYDROCODONE-ACETAMINOPHEN 5-325 MG PO TABS: 1.0000 | ORAL_TABLET | Freq: Three times a day (TID) | ORAL | 0 refills | Status: DC | PRN

## 2024-01-19 NOTE — Telephone Encounter (Signed)
 FYI Only or Action Required?: Action required by provider  Patient was last seen in primary care on 12/02/2023 by Araceli Knight, PA-C. Called Nurse Triage reporting Rib Injury. Symptoms began [name] week ago. Interventions attempted: Rest, hydration, or home remedies. Symptoms are: unchanged.  Triage Disposition: See Physician Within 24 Hours  Patient/caregiver understands and will follow disposition?: YesCopied from CRM [id number]. Topic: Clinical - Red Word Triage >> Jan 19, 2024  8:19 AM Laura [name] wrote: Red Word that prompted transfer to Nurse Triage: Patient has had probably [name] broken rib for [name] week. Cannot take the pain anymore. Reason for Disposition  [1] MODERATE pain (e.g., interferes with normal activities) AND [2] high-risk adult (e.g., age > 60 years, osteoporosis, chronic steroid use)  Answer Assessment - Initial Assessment Questions 1. MECHANISM: "How did the injury happen?"     During massage, pt felt it pop 2. ONSET: "When did the injury happen?" (Minutes or hours ago)     [name] week ago  3. LOCATION: "Where on the chest is the injury located?"     Right side under breast 4. APPEARANCE: "What does the injury look like?"     Looks normal  5. BLEEDING: "Is there any bleeding now? If Yes, ask: How long has it been bleeding?"     Na  6. SEVERITY: "Any difficulty with breathing?"     As work to take deep breath  7. SIZE: For cuts, bruises, or swelling, ask: "How large is it?" (e.g., inches or centimeters)     Na  8. PAIN: "Is there pain?" If Yes, ask: "How bad is the pain?"   (e.g., Scale 1-10; or mild, moderate, severe)     8     Pt and therapist heard it pop while taking [name] deep breath during [name] massage. Hurts to lie on right side. Pt has to work through taking  [name] deep breath due to pain.  Protocols used: Chest Injury-[name]-AH

## 2024-01-19 NOTE — Telephone Encounter (Signed)
 Patient seen  in office today with Dr. Greer Leak

## 2024-01-19 NOTE — Progress Notes (Signed)
   Acute Office Visit  Subjective:     Patient ID: Laura [name], female    DOB: May 21, 1971, 53 y.o.   MRN: [id number]  Chief Complaint  Patient presents with   Rib Injury    Pt reports that she was receiving a massage about 1 week ago and when she breathed in and the masseuse pushed down she heard a loud pop. She would like to get this checked because the pain has not eased up. Its under her R breast and feels like its rubbing when she breaths. She has tried Tylenol , IBU, Tramadol  and it doesn't touch her pain.      HPI Patient is in today for right rib anterior pain. Pt reports that she was receiving a massage about 1 week ago and when she breathed in and the masseuse pushed down she heard a loud pop. She would like to get this checked because the pain has not eased up. Its under her R breast and feels like its rubbing when she breaths. She has tried Tylenol , IBU, Tramadol  and it doesn't touch her pain.   ROS      Objective:    BP 115/71   Pulse (!) 51   Ht 5\' 4"  (1.626 m)   Wt 220 lb (99.8 kg)   LMP 02/12/2019   SpO2 100%   BMI 37.76 kg/m    Physical Exam Cardiovascular:     Rate and Rhythm: Normal rate and regular rhythm.  Pulmonary:     Effort: Pulmonary effort is normal.     Breath sounds: Normal breath sounds.  Musculoskeletal:     Comments: Tender over her right anterior ribs just below the breast area.  No popping with pressure.     No results found for any visits on 01/19/24.      Assessment & Plan:   Problem List Items Addressed This Visit   None Visit Diagnoses       Rib injury    -  Primary   Relevant Medications   HYDROcodone -acetaminophen  (NORCO/VICODIN) 5-325 MG tablet   Other Relevant Orders   DG Ribs Unilateral W/Chest Right      Rib injury-will give her a small quantity of hydrocodone  to use for the next 5 days as needed.  We then we can try to step back down to tramadol  if she is otherwise improving.  Will get evaluation further with  x-rays today we will call with results once available.  Meds ordered this encounter  Medications   HYDROcodone -acetaminophen  (NORCO/VICODIN) 5-325 MG tablet    Sig: Take 1 tablet by mouth 3 (three) times daily as needed for moderate pain (pain score 4-6).    Dispense:  15 tablet    Refill:  0    No follow-ups on file.  Duaine German, MD

## 2024-01-25 ENCOUNTER — Ambulatory Visit: Payer: Self-pay | Admitting: Family Medicine

## 2024-01-25 NOTE — Progress Notes (Signed)
 Yides, x-ray overall looks okay they could not confirm rib fracture but were just going to treated as 1.  It should hopefully continue to improve over the next couple of weeks if you have any problems or it does not seem to be healing then please let me know.  As you are feeling better I do want you to get an updated mammogram.

## 2024-01-28 NOTE — Progress Notes (Signed)
 Please order screening mammogram for patient and let her know when order has been placed.

## 2024-02-04 ENCOUNTER — Ambulatory Visit (INDEPENDENT_AMBULATORY_CARE_PROVIDER_SITE_OTHER): Payer: Managed Care, Other (non HMO) | Admitting: Family Medicine

## 2024-02-04 ENCOUNTER — Encounter: Payer: Self-pay | Admitting: Family Medicine

## 2024-02-04 VITALS — BP 111/64 | HR 58 | Ht 64.0 in | Wt 228.1 lb

## 2024-02-04 DIAGNOSIS — G43809 Other migraine, not intractable, without status migrainosus: Secondary | ICD-10-CM | POA: Diagnosis not present

## 2024-02-04 DIAGNOSIS — Z1231 Encounter for screening mammogram for malignant neoplasm of breast: Secondary | ICD-10-CM | POA: Diagnosis not present

## 2024-02-04 DIAGNOSIS — K589 Irritable bowel syndrome without diarrhea: Secondary | ICD-10-CM | POA: Insufficient documentation

## 2024-02-04 DIAGNOSIS — K580 Irritable bowel syndrome with diarrhea: Secondary | ICD-10-CM

## 2024-02-04 DIAGNOSIS — F330 Major depressive disorder, recurrent, mild: Secondary | ICD-10-CM

## 2024-02-04 MED ORDER — TOPIRAMATE 100 MG PO TABS: 100.0000 mg | ORAL_TABLET | Freq: Two times a day (BID) | ORAL | 3 refills | Status: AC

## 2024-02-04 MED ORDER — DICYCLOMINE HCL 20 MG PO TABS: 20.0000 mg | ORAL_TABLET | Freq: Three times a day (TID) | ORAL | 3 refills | Status: AC

## 2024-02-04 NOTE — Assessment & Plan Note (Signed)
 Stable.  Getting about 3 migraines per month on average usually lasting 1 to 3 days each.  Currently on Topamax .  Happy with her current regimen Maxalt  is effective when she uses it.

## 2024-02-04 NOTE — Assessment & Plan Note (Signed)
 Overall she is really doing well with Lexapro  10 mg does not want to make any changes today continue current regimen.  Plan to follow-up in 6 months.  Continue to use alprazolam  sparingly.

## 2024-02-04 NOTE — Assessment & Plan Note (Signed)
 I do not mind taking over the dicyclomine prescription new one sent for 1 year.

## 2024-02-04 NOTE — Progress Notes (Signed)
   Established Patient Office Visit  Subjective  Patient ID: Laura [name], female    DOB: 12/12/1970  Age: 53 y.o. MRN: [id number]  Chief Complaint  Patient presents with   mood   Migraine    HPI  57-month follow-up for migraine headaches-last headache was last week.  Follow-up mood-currently on Lexapro .  Uses alprazolam  as needed.  Overall she reports things are going well she recently got back from [place]  visiting her daughter.  PHQ-9 score of 1 for fatigue.  GAD-7 score of 7.  Labs were done in April.  Has a history of IBS and wanted to see if I would be willing to take over her dicyclomine she is more diarrhea predominant though she spends a fair amount of time and constipation as well.  Last prescription was written by Massie Mediate.    ROS    Objective:     BP 111/64   Pulse (!) 58   Ht 5' 4 (1.626 m)   Wt 228 lb 1.9 oz (103.5 kg)   LMP 02/12/2019   SpO2 99%   BMI 39.16 kg/m    Physical Exam Vitals and nursing note reviewed.  Constitutional:      Appearance: Normal appearance.  HENT:     Head: Normocephalic and atraumatic.   Eyes:     Conjunctiva/sclera: Conjunctivae normal.    Cardiovascular:     Rate and Rhythm: Normal rate and regular rhythm.  Pulmonary:     Effort: Pulmonary effort is normal.     Breath sounds: Normal breath sounds.   Skin:    General: Skin is warm and dry.   Neurological:     Mental Status: She is alert.   Psychiatric:        Mood and Affect: Mood normal.      No results found for any visits on 02/04/24.    The 10-year ASCVD risk score (Arnett DK, et al., 2019) is: 1%    Assessment & Plan:   Problem List Items Addressed This Visit       Cardiovascular and Mediastinum   Migraine   Stable.  Getting about 3 migraines per month on average usually lasting 1 to 3 days each.  Currently on Topamax .  Happy with her current regimen Maxalt  is effective when she uses it.      Relevant Medications   topiramate   (TOPAMAX ) 100 MG tablet     Digestive   IBS (irritable bowel syndrome)   I do not mind taking over the dicyclomine prescription new one sent for 1 year.      Relevant Medications   dicyclomine (BENTYL) 20 MG tablet     Other   Major depressive disorder, recurrent episode (HCC) - Primary   Overall she is really doing well with Lexapro  10 mg does not want to make any changes today continue current regimen.  Plan to follow-up in 6 months.  Continue to use alprazolam  sparingly.      Other Visit Diagnoses       Encounter for screening mammogram for malignant neoplasm of breast       Relevant Orders   MM 3D SCREENING MAMMOGRAM BILATERAL BREAST       Return in about 6 months (around 08/05/2024) for Mood, Migrains and labs .    Dorothyann Byars, MD

## 2024-03-02 ENCOUNTER — Ambulatory Visit

## 2024-03-02 DIAGNOSIS — Z1231 Encounter for screening mammogram for malignant neoplasm of breast: Secondary | ICD-10-CM | POA: Diagnosis not present

## 2024-03-07 ENCOUNTER — Ambulatory Visit: Payer: Self-pay | Admitting: Family Medicine

## 2024-03-07 NOTE — Progress Notes (Signed)
 Please call patient. Normal mammogram.  Repeat in 1 year.

## 2024-03-31 ENCOUNTER — Ambulatory Visit: Payer: Self-pay

## 2024-03-31 NOTE — Telephone Encounter (Signed)
 Spoke with patient. Scheduled tomorrow 04/01/2024 with Zada Palin, NP  States she had one low reading of 58 with symptoms of shakiness and presyncope Went and bought a glucometer and reading today 03/31/2024 after eating Trotellini pasta was 107 she states she felt like her heart was racing but pulse was 88.   Informed her that low blood sugar readings could be a medical emergency and if symptoms return she should call 911.  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 States he knows how to treat this.

## 2024-03-31 NOTE — Telephone Encounter (Signed)
 FYI Only or Action Required?: FYI only for provider.  Patient was last seen in primary care on 02/04/2024 by Alvan Dorothyann BIRCH, MD.  Called Nurse Triage reporting Blood Sugar Problem.  Symptoms began yesterday.  Interventions attempted: Nothing.  Symptoms are: stable.  Triage Disposition: Call PCP Within 24 Hours  Patient/caregiver understands and will follow disposition?: Yes  Copied from CRM #[id number]. Topic: Clinical - Red Word Triage >> Mar 31, 2024  8:30 AM Diannia H wrote: [facility] that prompted transfer to Nurse Triage: Patient blood sugar is really low, when she checked it last it was 58. She feels shaky. She doesn't have a meter and is not able to check her sugar. She ate something this morning but still feels shaky. No headache, no stomach ache. Reason for Disposition  [1] Blood glucose 70 mg/dL (3.9 mmol/L) or below OR symptomatic, now improved with Care Advice AND [2] cause unknown  Answer Assessment - Initial Assessment Questions 1. SYMPTOMS: What symptoms are you concerned about?     Shaking,  2. ONSET:  When did the symptoms start?     yesterday 3. BLOOD GLUCOSE: What is your blood glucose level?      Unknown at present time 4. USUAL RANGE: What is your blood glucose level usually? (e.g., usual fasting morning value, usual evening value)     unknown 5. TYPE 1 or 2:  Do you know what type of diabetes you have?  (e.g., Type 1, Type 2, Gestational; doesn't know)      no 6. INSULIN: Do you take insulin? What type of insulin(s) do you use? What is the mode of delivery? (syringe, pen; injection or pump) When did you last give yourself an insulin dose? (i.e., time or hours/minutes ago) How much did you give? (i.e., how many units)     no 7. DIABETES PILLS: Do you take any pills for your diabetes? If Yes, ask: What is the name of the medicine(s) that you take for high blood sugar?     no 8. OTHER SYMPTOMS: Do you have any symptoms? (e.g., fever,  frequent urination, difficulty breathing, vomiting)     Shaking, overall not feeling well at present time 9. LOW BLOOD GLUCOSE TREATMENT: What have you done so far to treat the low blood glucose level?    FSBS 58 yesterday 10. FOOD: When did you last eat or drink?       0800 11. ALONE: Are you alone right now or is someone with you?        no 12. PREGNANCY: Is there any chance you are pregnant? When was your last menstrual period?       Na  Pt does not have a history of diabetes but does have  Protocols used: Diabetes - Low Blood Sugar-A-AH

## 2024-04-01 ENCOUNTER — Ambulatory Visit (INDEPENDENT_AMBULATORY_CARE_PROVIDER_SITE_OTHER): Admitting: Medical-Surgical

## 2024-04-01 ENCOUNTER — Encounter: Payer: Self-pay | Admitting: Medical-Surgical

## 2024-04-01 VITALS — BP 119/78 | HR 57 | Resp 20 | Ht 64.0 in | Wt 223.0 lb

## 2024-04-01 DIAGNOSIS — Z9884 Bariatric surgery status: Secondary | ICD-10-CM | POA: Diagnosis not present

## 2024-04-01 DIAGNOSIS — E162 Hypoglycemia, unspecified: Secondary | ICD-10-CM | POA: Diagnosis not present

## 2024-04-01 DIAGNOSIS — R5383 Other fatigue: Secondary | ICD-10-CM | POA: Diagnosis not present

## 2024-04-01 DIAGNOSIS — L659 Nonscarring hair loss, unspecified: Secondary | ICD-10-CM

## 2024-04-01 LAB — POCT GLYCOSYLATED HEMOGLOBIN (HGB A1C): Hemoglobin A1C: 5.1 % (ref 4.0–5.6)

## 2024-04-01 MED ORDER — OMEPRAZOLE 40 MG PO CPDR: 40.0000 mg | DELAYED_RELEASE_CAPSULE | Freq: Two times a day (BID) | ORAL | 0 refills | Status: DC

## 2024-04-01 MED ORDER — ONDANSETRON HCL 4 MG PO TABS: 4.0000 mg | ORAL_TABLET | Freq: Three times a day (TID) | ORAL | 3 refills | Status: AC | PRN

## 2024-04-01 NOTE — Progress Notes (Signed)
 Established patient visit   History of Present Illness   Discussed the use of AI scribe software for clinical note transcription with the patient, who gave verbal consent to proceed.  History of Present Illness   Laura [name] is a 53 year old female who presents with episodes of hypoglycemia.  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 [employer] home - Diet is high in protein, with eggs for breakfast and meals from a meal delivery service - Acknowledges low protein levels despite high-protein diet  Post-bariatric surgery weight changes - History of gastric bypass surgery - Weight regain of 40 pounds over the past year - Expresses frustration with weight management  Constitutional symptoms - Significant hair loss - Fatigue, sleeping up to 14 hours at a time  Thyroid function abnormalities - Borderline thyroid levels in the past  Medication use - Omeprazole  40 mg twice daily - Zofran  for headaches with nausea      Physical Exam   Physical Exam Vitals reviewed.  Constitutional:      General: She is not in acute distress.    Appearance: Normal appearance.  HENT:     Head: Normocephalic and atraumatic.  Cardiovascular:     Rate and Rhythm: Normal rate and regular rhythm.     Pulses: Normal pulses.     Heart sounds: Normal heart sounds. No murmur heard.    No friction rub. No gallop.  Pulmonary:     Effort: Pulmonary effort is normal. No respiratory distress.     Breath  sounds: Normal breath sounds. No wheezing.  Skin:    General: Skin is warm and dry.  Neurological:     Mental Status: She is alert and oriented to person, place, and time.  Psychiatric:        Mood and Affect: Mood normal.        Behavior: Behavior normal.        Thought Content: Thought content normal.        Judgment: Judgment normal.    Assessment & Plan   Assessment and Plan    Hypoglycemia Recurrent hypoglycemia with blood sugar levels at 58 mg/dL. A1c is 5.1, indicating no diabetes. - Provide Freestyle Libre 3 Plus samples for continuous glucose monitoring. - Advise higher protein intake with regular meals and snacks. - Recommend protein and carbohydrate-rich snacks for hypoglycemic episodes.  Weight regain post-bariatric surgery 40-pound weight regain post-surgery. Possible insufficient protein intake. - Order comprehensive lab work for nutritional status. - Advise increased protein intake and monitor dietary habits.  Hair loss Significant hair loss possibly due to nutritional deficiencies, thyroid function, or post-COVID effects. - Order comprehensive lab work including thyroid panel, cortisol, copper, ferritin, folate, vitamin D, vitamin B1, B12, zinc, and magnesium. - Discuss nutritional causes and importance of adequate protein intake.  Fatigue Chronic fatigue possibly related to nutritional deficiencies, thyroid function, or post-COVID effects. - Order comprehensive lab work including thyroid panel, cortisol, copper, ferritin, folate, vitamin D, vitamin B1, B12, zinc, and magnesium.  Migraine with associated nausea  Migraines with nausea, exacerbated by weather changes. - Prescribe Zofran  for nausea.     Follow up   Return if symptoms worsen or fail to improve. __________________________________ Zada FREDRIK Palin, DNP, APRN, FNP-BC Primary Care and Sports Medicine [facility]

## 2024-04-04 ENCOUNTER — Ambulatory Visit: Payer: Self-pay | Admitting: Medical-Surgical

## 2024-04-04 DIAGNOSIS — R7989 Other specified abnormal findings of blood chemistry: Secondary | ICD-10-CM

## 2024-04-06 ENCOUNTER — Encounter: Payer: Self-pay | Admitting: Medical-Surgical

## 2024-04-06 DIAGNOSIS — R7989 Other specified abnormal findings of blood chemistry: Secondary | ICD-10-CM

## 2024-04-06 DIAGNOSIS — E162 Hypoglycemia, unspecified: Secondary | ICD-10-CM

## 2024-04-06 DIAGNOSIS — R5383 Other fatigue: Secondary | ICD-10-CM

## 2024-04-06 DIAGNOSIS — L659 Nonscarring hair loss, unspecified: Secondary | ICD-10-CM

## 2024-04-07 LAB — CMP14+EGFR
ALT: 38 IU/L — ABNORMAL HIGH (ref 0–32)
AST: 34 IU/L (ref 0–40)
Albumin: 3.7 g/dL — ABNORMAL LOW (ref 3.8–4.9)
Alkaline Phosphatase: 149 IU/L — ABNORMAL HIGH (ref 44–121)
BUN/Creatinine Ratio: 12 (ref 9–23)
BUN: 9 mg/dL (ref 6–24)
Bilirubin Total: 0.4 mg/dL (ref 0.0–1.2)
CO2: 19 mmol/L — ABNORMAL LOW (ref 20–29)
Calcium: 8.6 mg/dL — ABNORMAL LOW (ref 8.7–10.2)
Chloride: 109 mmol/L — ABNORMAL HIGH (ref 96–106)
Creatinine, Ser: 0.76 mg/dL (ref 0.57–1.00)
Globulin, Total: 2.1 g/dL (ref 1.5–4.5)
Glucose: 83 mg/dL (ref 70–99)
Potassium: 4 mmol/L (ref 3.5–5.2)
Sodium: 143 mmol/L (ref 134–144)
Total Protein: 5.8 g/dL — ABNORMAL LOW (ref 6.0–8.5)
eGFR: 93 mL/min/1.73 (ref >59)

## 2024-04-07 LAB — TSH+T4F+T3FREE+THYABS+TPO+VD25
Free T4: 0.96 ng/dL (ref 0.82–1.77)
T3, Free: 3 pg/mL (ref 2.0–4.4)
TSH: 6.17 u[IU]/mL — ABNORMAL HIGH (ref 0.450–4.500)
Thyroglobulin Antibody: <1 [IU]/mL (ref 0.0–0.9)
Thyroperoxidase Ab SerPl-aCnc: 41 [IU]/mL — ABNORMAL HIGH (ref 0–34)
Vit D, 25-Hydroxy: 70.7 ng/mL (ref 30.0–100.0)

## 2024-04-07 LAB — MAGNESIUM: Magnesium: 2 mg/dL (ref 1.6–2.3)

## 2024-04-07 LAB — FERRITIN: Ferritin: 46 ng/mL (ref 15–150)

## 2024-04-07 LAB — VITAMIN B12: Vitamin B-12: 1998 pg/mL — ABNORMAL HIGH (ref 232–1245)

## 2024-04-07 LAB — CORTISOL: Cortisol: 3.9 ug/dL — ABNORMAL LOW (ref 6.2–19.4)

## 2024-04-07 LAB — COPPER, SERUM: Copper: 112 ug/dL (ref 80–158)

## 2024-04-07 LAB — VITAMIN B1: Thiamine: 134.4 nmol/L (ref 66.5–200.0)

## 2024-04-07 LAB — ZINC: Zinc: 68 ug/dL (ref 44–115)

## 2024-04-07 LAB — FOLATE: Folate: 16.3 ng/mL (ref >3.0)

## 2024-04-12 ENCOUNTER — Encounter: Payer: Self-pay | Admitting: Sports Medicine

## 2024-04-13 NOTE — Telephone Encounter (Signed)
 Pended referral

## 2024-04-13 NOTE — Addendum Note (Signed)
 Addended by: Jamina Macbeth P on: 04/13/2024 10:07 AM   Modules accepted: Orders

## 2024-04-13 NOTE — Addendum Note (Signed)
 Addended by: Journey Castonguay D on: 04/13/2024 08:54 PM   Modules accepted: Orders

## 2024-04-14 ENCOUNTER — Other Ambulatory Visit: Payer: Self-pay | Admitting: Family Medicine

## 2024-04-14 DIAGNOSIS — F4321 Adjustment disorder with depressed mood: Secondary | ICD-10-CM

## 2024-04-14 DIAGNOSIS — F330 Major depressive disorder, recurrent, mild: Secondary | ICD-10-CM

## 2024-04-27 MED ORDER — FREESTYLE LIBRE 3 PLUS SENSOR MISC: 1 refills | Status: DC

## 2024-04-27 NOTE — Addendum Note (Signed)
 Addended by: Amely Voorheis P on: 04/27/2024 08:12 AM   Modules accepted: Orders

## 2024-04-27 NOTE — Telephone Encounter (Signed)
 Pended prescription

## 2024-04-27 NOTE — Addendum Note (Signed)
 Addended by: Orrie Schubert D on: 04/27/2024 09:10 AM   Modules accepted: Orders

## 2024-05-03 ENCOUNTER — Other Ambulatory Visit (HOSPITAL_BASED_OUTPATIENT_CLINIC_OR_DEPARTMENT_OTHER): Payer: Self-pay

## 2024-05-03 ENCOUNTER — Ambulatory Visit (HOSPITAL_BASED_OUTPATIENT_CLINIC_OR_DEPARTMENT_OTHER)

## 2024-05-03 ENCOUNTER — Encounter (HOSPITAL_BASED_OUTPATIENT_CLINIC_OR_DEPARTMENT_OTHER): Payer: Self-pay | Admitting: Student

## 2024-05-03 ENCOUNTER — Ambulatory Visit (HOSPITAL_BASED_OUTPATIENT_CLINIC_OR_DEPARTMENT_OTHER): Admitting: Student

## 2024-05-03 DIAGNOSIS — G8929 Other chronic pain: Secondary | ICD-10-CM

## 2024-05-03 DIAGNOSIS — M25562 Pain in left knee: Secondary | ICD-10-CM

## 2024-05-03 DIAGNOSIS — M25552 Pain in left hip: Secondary | ICD-10-CM

## 2024-05-03 MED ORDER — LIDOCAINE HCL 1 % IJ SOLN
4.0000 mL | INTRAMUSCULAR | Status: AC | PRN
  Administered 2024-05-03: 4 mL

## 2024-05-03 MED ORDER — TRIAMCINOLONE ACETONIDE 40 MG/ML IJ SUSP
2.0000 mL | INTRAMUSCULAR | Status: AC | PRN
  Administered 2024-05-03: 2 mL via INTRA_ARTICULAR

## 2024-05-03 NOTE — Progress Notes (Signed)
 Chief Complaint: Left hip and knee pain     History of Present Illness:   05/03/24: Patient presents to clinic today for follow-up of left hip and left knee pain.  She does have a longstanding history of greater trochanteric pain and has received cortisone injections of these, last was on 08/03/2023 which gave her about 3 months of good relief.  She is unable to lay on her side at night due to hip pain.  Both hips are bothersome although the left is notably worse.  She also describes some left knee pain that has been ongoing for a few weeks without recent injury.  Pain is more on the lateral side of the knee, has occasionally buckled, and is generally more painful with weightbearing.  She has tried cortisone and gel injections in the past.  Has recently been using Voltaren and a lidocaine  patch.  She has an upcoming weeklong trip to [place] this weekend.    08/02/24: Laura [name] is a 53 y.o. female here today with a chief complaint of lateral hip pain of both hips, right worse than left.  She did have a cortisone injection with Dr. Genelle on 04/15/2023 of the right gluteus tendons which did give her great relief.  This lasted up until last week and reports that she may have overdone it with her activity level.  Rates pain today at an 8/10.  Has been taking occasional tramadol  which does help give her relief as well as using ice and heat.   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    Sleep apnea    cpap   WPW (Wolff-Parkinson-White syndrome)    hx of ablation 2012   Past Surgical History:  Procedure Laterality Date   CESAREAN SECTION     x 2   gastric bypass surgery      INCISIONAL HERNIA REPAIR N/A 07/01/2021   Procedure: INCISIONAL HERNIA REPAIR WITH MESH;  Surgeon: Kinsinger, Herlene Righter, MD;  Location: [facility];  Service: General;  Laterality: N/A;   LAPAROSCOPIC CHOLECYSTECTOMY     left foot surgery      SLEEVE GASTROPLASTY     SUPRAVENTRICULAR TACHYCARDIA ABLATION N/A 07/23/2011   Procedure: SUPRAVENTRICULAR TACHYCARDIA ABLATION;  Surgeon: Danelle LELON Birmingham, MD;  Location[facility] CATH LAB;  Service: Cardiovascular;  Laterality: N/A;   transthoracic echcardiogram  09/15/2006   Social History   Socioeconomic History   Marital status: Married    Spouse name: matthew   Number of children: 5   Years of education: Not on file   Highest education level: Not on file  Occupational History   Occupation: Designer, jewellery    Employer: [employer]  Tobacco  Use   Smoking status: Former    Current packs/day: 0.00    Types: Cigarettes    Quit date: 07/15/2002    Years since quitting: 21.8   Smokeless tobacco: Never  Vaping Use   Vaping status: Never Used  Substance and Sexual Activity   Alcohol use: Never   Drug use: Never   Sexual activity: Yes    Partners: Male  Other Topics Concern   Not on file  Social History Narrative   Some exercise.  In bariatric program at [facility]    Social Drivers of Corporate investment banker Strain: Low Risk  (05/02/2024)   Received from [facility]   Overall Financial Resource Strain (CARDIA)    How hard is it for you to pay for the very basics like food, housing, medical care, and heating?: Not hard at all  Food Insecurity: No Food Insecurity (05/02/2024)   Received [facility]   Hunger Vital Sign    Within the past 12 months, you worried that your food would run out before you got  the money to buy more.: Never true    Within the past 12 months, the food you bought just didn't last and you didn't have money to get more.: Never true  Transportation Needs: No Transportation Needs (05/02/2024)   Received from [facility] - Transportation    In the past 12 months, has lack of transportation kept you from medical appointments or from getting medications?: No    In the past 12 months, has lack of transportation kept you from meetings, work, or from getting things needed for daily living?: No  Physical Activity: Inactive (05/02/2024)   Received from [facility]   Exercise Vital Sign    On average, how many days per week do you engage in moderate to strenuous exercise (like a brisk walk)?: 0 days    Minutes of Exercise per Session: Not on file  Stress: No Stress Concern Present (05/02/2024)   Received from [facility] - Occupational Stress Questionnaire    Do you feel stress - tense, restless, nervous, or anxious, or unable to sleep at night because your mind is troubled all the time - these days?: Only a little  Social Connections: Moderately Integrated (05/02/2024)   Received from [facility]   Social Network    How would you rate your social network (family, work, friends)?: Adequate participation with social networks   Family History  Problem Relation Age of Onset   Colon cancer Mother 32   Breast cancer Mother    Diabetes Mother    Heart attack Mother        AMI, in late 87's   Depression Mother    Heart disease Mother    Hypertension Father    Thyroid disease Brother    Thyroid disease Sister    Allergies  Allergen Reactions   Morphine Itching   Chromium Other (See Comments)    causes contact dermatitis   Neomycin     Blisters on hands   Penicillins Hives   Current Outpatient Medications  Medication Sig Dispense Refill   ALPRAZolam  (XANAX ) 0.5 MG tablet Take 0.5-1 tablets (0.25-0.5 mg total) by  mouth at bedtime as needed for anxiety. 20 tablet 0   BIOTIN  5000 PO Take 5,000 mcg by mouth daily.     Calcium  Carbonate (CALCIUM  600 PO) Take 600 mg by mouth daily.     Cholecalciferol  50 MCG (  2000 UT) TABS Take 2,000 Units by mouth daily.     clobetasol  ointment (TEMOVATE ) 0.05 % Apply 1 application topically 2 (two) times daily as needed (eczema).     Continuous Glucose Sensor (FREESTYLE LIBRE 3 PLUS SENSOR) MISC Change sensor every 15 days. 2 each 1   dicyclomine  (BENTYL ) 20 MG tablet Take 1 tablet (20 mg total) by mouth 3 (three) times daily. 270 tablet 3   escitalopram  (LEXAPRO ) 10 MG tablet TAKE 1 TABLET BY MOUTH EVERY DAY 90 tablet 1   famotidine  (PEPCID ) 20 MG tablet Take 20 mg by mouth at bedtime as needed for heartburn or indigestion.     fluticasone  (FLONASE ) 50 MCG/ACT nasal spray Place 2 sprays into both nostrils daily. (Patient taking differently: Place 2 sprays into both nostrils daily as needed for allergies.) 16 g 6   ipratropium (ATROVENT ) 0.06 % nasal spray INSTILL 2 SPRAYS INTO BOTH NOSTRILS FOUR TIMES A DAY 45 mL 1   levalbuterol (XOPENEX HFA) 45 MCG/ACT inhaler Inhale 1-2 puffs into the lungs every 6 (six) hours as needed for wheezing. 15 g 0   omeprazole  (PRILOSEC) 40 MG capsule Take 1 capsule (40 mg total) by mouth in the morning and at bedtime. 180 capsule 0   ondansetron  (ZOFRAN ) 4 MG tablet Take 1 tablet (4 mg total) by mouth every 8 (eight) hours as needed for nausea or vomiting. 20 tablet 3   Prenatal Vit-Fe Fumarate-FA (PRENATAL VITAMIN PO) Take 1 tablet by mouth daily.     rizatriptan  (MAXALT -MLT) 10 MG disintegrating tablet Take 1 tablet (10 mg total) by mouth as needed for migraine. May repeat in 2 hours if needed 10 tablet 5   topiramate  (TOPAMAX ) 100 MG tablet Take 1 tablet (100 mg total) by mouth 2 (two) times daily. 180 tablet 3   traMADol  (ULTRAM ) 50 MG tablet Take 1 tablet (50 mg total) by mouth 2 (two) times daily as needed for moderate pain. 60 tablet 0    No current facility-administered medications for this visit.   No results found.  Review of Systems:   A ROS was performed including pertinent positives and negatives as documented in the HPI.  Physical Exam :   Constitutional: NAD and appears stated age Neurological: Alert and oriented Psych: Appropriate affect and cooperative Last menstrual period 02/12/2019.   Comprehensive Musculoskeletal Exam:    Exam of the left hip demonstrates tenderness directly over the greater trochanter.  Fluid passive range of motion of the hip to 120 degrees flexion, 30 degrees ER, and 20 degrees IR.  Negative FABER. Left knee demonstrates tenderness over the lateral joint line.  Active range of motion is from 0 to 110 degrees.  No laxity with varus or valgus stress.  No overlying erythema or warmth.    Imaging:   Xray (left knee 4 views): Mild to moderate osteoarthritis of the medial and patellofemoral compartments with osteophyte formation and joint space narrowing.   I personally reviewed and interpreted the radiographs.   Assessment:   53 y.o. female with lateral left hip pain as well as left knee pain.  Her hip pain is suggestive of underlying gluteal tendinopathy or trochanteric bursitis as the pain is lateral and does not radiate.  She has done well with injections in the past so I would like to repeat an injection at the left trochanter for symptom relief particular with her upcoming trip to [place].  Given her history of multiple injections, I would like to consider her having a follow-up in  the future with Dr. Genelle to discuss further options.  She is also experiencing some atraumatic left knee pain which I believe is most attributed to mild to moderate osteoarthritis.  Has been different types of injections for these in the past with some relief.  Offered a cortisone injection of the left knee as well today which patient would like to proceed with.  Can follow-up as needed.  Plan :    -  Left greater trochanter ultrasound-guided injection and left knee injection performed today - Follow-up as needed    Procedure Note  Patient: Laura [name]             Date of Birth: 05/17/1971           MRN: [id number]             Visit Date: 05/03/2024  Procedures: Visit Diagnoses:  1. Chronic pain of left knee     Large Joint Inj: L knee on 05/03/2024 10:57 AM Indications: pain Details: 22 G 1.5 in needle, anterolateral approach Medications: 4 mL lidocaine  1 %; 2 mL triamcinolone  acetonide 40 MG/ML Outcome: tolerated well, no immediate complications Procedure, treatment alternatives, risks and benefits explained, specific risks discussed. Consent was given by the patient. Immediately prior to procedure a time out was called to verify the correct patient, procedure, equipment, support staff and site/side marked as required. Patient was prepped and draped in the usual sterile fashion.    Large Joint Inj: L greater trochanter on 05/03/2024 10:58 AM Indications: pain Details: 22 G 3.5 in needle, ultrasound-guided lateral approach Medications: 4 mL lidocaine  1 %; 2 mL triamcinolone  acetonide 40 MG/ML Outcome: tolerated well, no immediate complications Procedure, treatment alternatives, risks and benefits explained, specific risks discussed. Consent was given by the patient. Immediately prior to procedure a time out was called to verify the correct patient, procedure, equipment, support staff and site/side marked as required. Patient was prepped and draped in the usual sterile fashion.      I personally saw and evaluated the patient, and participated in the management and treatment plan.  Leonce Reveal, PA-C Orthopedics

## 2024-05-04 ENCOUNTER — Other Ambulatory Visit: Payer: Self-pay | Admitting: Physician Assistant

## 2024-05-04 ENCOUNTER — Other Ambulatory Visit: Payer: Self-pay | Admitting: Family Medicine

## 2024-05-04 DIAGNOSIS — F4310 Post-traumatic stress disorder, unspecified: Secondary | ICD-10-CM

## 2024-05-04 DIAGNOSIS — M2550 Pain in unspecified joint: Secondary | ICD-10-CM

## 2024-05-12 NOTE — Telephone Encounter (Signed)
 Okay to enter new referral for [facility] per her request below.  Hopefully their availability is a little better.

## 2024-05-12 NOTE — Addendum Note (Signed)
 Addended by: Jalan Bodi P on: 05/12/2024 10:14 AM   Modules accepted: Orders

## 2024-05-19 LAB — ACTH: ACTH: 10.6 pg/mL (ref 7.2–63.3)

## 2024-05-19 LAB — CORTISOL-AM, BLOOD: Cortisol - AM: 1.5 ug/dL — AB (ref 6.2–19.4)

## 2024-05-23 ENCOUNTER — Encounter: Payer: Self-pay | Admitting: Family Medicine

## 2024-05-25 NOTE — Telephone Encounter (Signed)
 Please check on endo referral at [facility] per below and see where that is at.    Also make sure eating 60 grams of protein throught the day to keep sugars stable.

## 2024-06-13 ENCOUNTER — Encounter: Payer: Self-pay | Admitting: Radiology

## 2024-06-21 ENCOUNTER — Other Ambulatory Visit: Payer: Self-pay | Admitting: Family Medicine

## 2024-06-21 DIAGNOSIS — E162 Hypoglycemia, unspecified: Secondary | ICD-10-CM

## 2024-06-27 ENCOUNTER — Other Ambulatory Visit: Payer: Self-pay | Admitting: Medical-Surgical

## 2024-07-21 ENCOUNTER — Ambulatory Visit

## 2024-07-21 ENCOUNTER — Ambulatory Visit (INDEPENDENT_AMBULATORY_CARE_PROVIDER_SITE_OTHER): Admitting: Medical-Surgical

## 2024-07-21 ENCOUNTER — Ambulatory Visit: Payer: Self-pay | Admitting: *Deleted

## 2024-07-21 ENCOUNTER — Other Ambulatory Visit: Payer: Self-pay | Admitting: Medical-Surgical

## 2024-07-21 ENCOUNTER — Encounter: Payer: Self-pay | Admitting: Medical-Surgical

## 2024-07-21 VITALS — BP 110/71 | HR 67 | Resp 20 | Ht 64.0 in | Wt 229.6 lb

## 2024-07-21 DIAGNOSIS — R0789 Other chest pain: Secondary | ICD-10-CM | POA: Diagnosis not present

## 2024-07-21 DIAGNOSIS — Z1211 Encounter for screening for malignant neoplasm of colon: Secondary | ICD-10-CM | POA: Diagnosis not present

## 2024-07-21 DIAGNOSIS — Z9884 Bariatric surgery status: Secondary | ICD-10-CM | POA: Diagnosis not present

## 2024-07-21 MED ORDER — HYDROCODONE-ACETAMINOPHEN 5-325 MG PO TABS: 1.0000 | ORAL_TABLET | Freq: Three times a day (TID) | ORAL | 0 refills | Status: AC | PRN

## 2024-07-21 NOTE — Telephone Encounter (Signed)
 The patient has been scheduled with the provider today at 1015 for the following symptoms.

## 2024-07-21 NOTE — Telephone Encounter (Signed)
 FYI Only or Action Required?: FYI only for provider: ED advised and patient declined, requesting OV.  Patient was last seen in primary care on 04/01/2024 by Laura Mini, NP.  Called Nurse Triage reporting Chest Pain. Left side under breast , s/p fall pain breathing in   Symptoms began several days ago. Couple of days ago   Interventions attempted: Nothing.  Symptoms are: gradually worsening.  Triage Disposition: Go to ED Now (or PCP Triage)  Patient/caregiver understands and will follow disposition?: No, wishes to speak with PCP  Patient requesting call back if PCP will see her today . Please advise.   CAL notified pt declined ED.    Copied from CRM #[id number]. Topic: Clinical - Red Word Triage >> Jul 21, 2024  8:25 AM Olam RAMAN wrote: Red Word that prompted transfer to Nurse Triage: Pt feels she has broken ribs. Pt stated she fell and now is in pain L side. Reason for Disposition  Taking a deep breath makes pain worse  Answer Assessment - Initial Assessment Questions Recommended ED. Patient does not want to go to ED and requesting to see PCP . Patient reports falling up stairs couple of days ago and laid on massage table yesterday and made sx worse. Pain taking breath in .  CAL notified patient does not want to go to ED. Please advise and patient requesting OV       1. LOCATION: Where does it hurt?       Left side under breast  2. RADIATION: Does the pain go anywhere else? (e.g., into neck, jaw, arms, back)     No  3. ONSET: When did the chest pain begin? (Minutes, hours or days)      After falling couple of days ago up stairs 4. PATTERN: Does the pain come and go, or has it been constant since it started?  Does it get worse with exertion?      Constant  5. DURATION: How long does it last (e.g., seconds, minutes, hours)     All day since last night , triggered pain after laying on massage table  6. SEVERITY: How bad is the pain?  (e.g., Scale 1-10; mild,  moderate, or severe)     3/10  7. CARDIAC RISK FACTORS: Do you have any history of heart problems or risk factors for heart disease? (e.g., angina, prior heart attack; diabetes, high blood pressure, high cholesterol, smoker, or strong family history of heart disease)     Na  8. PULMONARY RISK FACTORS: Do you have any history of lung disease?  (e.g., blood clots in lung, asthma, emphysema, birth control pills)     Na  9. CAUSE: What do you think is causing the chest pain?     S/p fall up stairs and left rib area pain 10. OTHER SYMPTOMS: Do you have any other symptoms? (e.g., dizziness, nausea, vomiting, sweating, fever, difficulty breathing, cough)       Left side pain under left breast area taking deep breath in causing pain .  11. PREGNANCY: Is there any chance you are pregnant? When was your last menstrual period?       na  Protocols used: Chest Pain-A-AH

## 2024-07-21 NOTE — Progress Notes (Signed)
° °       Established patient visit   History of Present Illness   Discussed the use of AI scribe software for clinical note transcription with the patient, who gave verbal consent to proceed.  History of Present Illness   Laura [name] is a 53 year old female who presents with left-sided chest pain following a fall.  Left-sided chest pain - Acute onset following a fall on the stairs - Sharp pain located under the left breast, radiating to the sternum - Pain intensity increases with certain movements, deep breathing, lifting, and exhaling - Unable to tolerate lying prone for a massage due to pain - No associated shortness of breath, cough, or wheeze - Deep inspiration exacerbates chest pain  Nephrolithiasis and flank pain - Recent severe flank pain attributed to kidney stones - Using oxycodone  for pain management, prescribed by the hospital; makes her woozy and may have contributed to her fall - Believes a stone was passed with intense pain during dinner - Subsequent episode of severe kidney pain the following Monday, resulting in missed work - Known 12 mm right renal stone, concerned about its size  History of musculoskeletal injuries - Prior rib injuries and fractures with slow healing - History of foot fracture requiring surgical intervention - Chronic hip problems, descends stairs one step at a time - Takes daily calcium  with vitamin D supplementation     Physical Exam   Physical Exam Vitals reviewed.  Constitutional:      General: She is not in acute distress.    Appearance: Normal appearance. She is not ill-appearing.  HENT:     Head: Normocephalic and atraumatic.  Cardiovascular:     Rate and Rhythm: Normal rate and regular rhythm.  Pulmonary:     Effort: Pulmonary effort is normal. No respiratory distress.  Chest:    Skin:    General: Skin is warm and dry.  Neurological:     Mental Status: She is alert and oriented to person, place, and time.  Psychiatric:         Mood and Affect: Mood normal.        Behavior: Behavior normal.        Thought Content: Thought content normal.        Judgment: Judgment normal.    Assessment & Plan   Left-sided rib pain Acute rib pain post-fall, likely fracture or contusion. Oxycodone  makes her woozy and may have contributed to her fall. Consideration of bone density issues due to recurrent injuries. - Ordered chest x-ray with rib evaluation. - Discontinue oxycodone . - Prescribed Norco 5-325mg  1 tab q8 hours prn for pain management. - Ordered DEXA scan for bone density evaluation.  Nephrolithiasis Recurrent kidney stones with a 12 mm stone in the right kidney. Has a follow up with Urology scheduled. - Follow up with Urology as directed.   General Health Maintenance Discussed bone health and need for bone density evaluation due to recurrent rib injuries and gastric bypass history. - Ordered DEXA scan for bone density evaluation.  - Referring to Dr. San for colonoscopy per patient preference.     Follow up   Return if symptoms worsen or fail to improve. __________________________________ Zada FREDRIK Palin, DNP, APRN, FNP-BC Primary Care and Sports Medicine [facility]

## 2024-07-22 ENCOUNTER — Telehealth: Payer: Self-pay

## 2024-07-22 ENCOUNTER — Other Ambulatory Visit (HOSPITAL_COMMUNITY): Payer: Self-pay

## 2024-07-22 ENCOUNTER — Encounter: Payer: Self-pay | Admitting: Medical-Surgical

## 2024-07-22 NOTE — Telephone Encounter (Signed)
 Pharmacy Patient Advocate Encounter  Received notification from [facility] that Prior Authorization for Hydrocodone /APAP 5/325mg  tabs has been APPROVED from 07/22/24 to 08/21/24   PA #/Case ID/Reference #: [id number]

## 2024-07-22 NOTE — Telephone Encounter (Signed)
 Pharmacy Patient Advocate Encounter   Received notification from Pt Calls Messages that prior authorization for Hydrocodone /APAP 5/325mg  tabs is required/requested.   Insurance verification completed.   The patient is insured through [facility].   Per test claim: PA required; PA submitted to above mentioned insurance via Latent Key/confirmation #/EOC [id number] Status is pending

## 2024-07-26 ENCOUNTER — Ambulatory Visit: Admitting: "Endocrinology

## 2024-07-29 ENCOUNTER — Ambulatory Visit: Payer: Self-pay | Admitting: Medical-Surgical

## 2024-07-29 NOTE — Telephone Encounter (Signed)
 I called and had the x-ray changed to a stat read.

## 2024-08-08 ENCOUNTER — Telehealth: Payer: Self-pay | Admitting: Family Medicine

## 2024-08-08 ENCOUNTER — Ambulatory Visit (INDEPENDENT_AMBULATORY_CARE_PROVIDER_SITE_OTHER): Admitting: Family Medicine

## 2024-08-08 ENCOUNTER — Encounter: Payer: Self-pay | Admitting: Family Medicine

## 2024-08-08 VITALS — BP 127/61 | HR 61 | Ht 64.0 in | Wt 224.0 lb

## 2024-08-08 DIAGNOSIS — G43809 Other migraine, not intractable, without status migrainosus: Secondary | ICD-10-CM | POA: Diagnosis not present

## 2024-08-08 DIAGNOSIS — F330 Major depressive disorder, recurrent, mild: Secondary | ICD-10-CM

## 2024-08-08 DIAGNOSIS — S298XXA Other specified injuries of thorax, initial encounter: Secondary | ICD-10-CM

## 2024-08-08 DIAGNOSIS — M1611 Unilateral primary osteoarthritis, right hip: Secondary | ICD-10-CM

## 2024-08-08 DIAGNOSIS — Z23 Encounter for immunization: Secondary | ICD-10-CM

## 2024-08-08 DIAGNOSIS — M25551 Pain in right hip: Secondary | ICD-10-CM

## 2024-08-08 DIAGNOSIS — M25552 Pain in left hip: Secondary | ICD-10-CM | POA: Diagnosis not present

## 2024-08-08 MED ORDER — TRAMADOL HCL 50 MG PO TABS: 50.0000 mg | ORAL_TABLET | Freq: Two times a day (BID) | ORAL | 0 refills | Status: AC | PRN

## 2024-08-08 MED ORDER — RIZATRIPTAN BENZOATE 10 MG PO TBDP: 10.0000 mg | ORAL_TABLET | ORAL | 12 refills | Status: AC | PRN

## 2024-08-08 NOTE — Telephone Encounter (Signed)
Please check on GI referral.

## 2024-08-08 NOTE — Progress Notes (Signed)
 "  Established Patient Office Visit  Patient ID: Laura [name], female    DOB: 1971/04/19  Age: 53 y.o. MRN: [id number] PCP: Alvan Dorothyann BIRCH, MD  No chief complaint on file.   Subjective:     HPI  Discussed the use of AI scribe software for clinical note transcription with the patient, who gave verbal consent to proceed.  History of Present Illness Laura [name] is a 53 year old female who presents with worsening mood and fatigue.  Depressed mood and fatigue - Mood is at its worst in a while, feeling miserable and attributing this to physical health issues. - Persistent fatigue with episodes of falling asleep at work. - On stable dose of Lexapro  for an extended period; uncertain if mood symptoms are related to medication or physical health. - Works nights at a part-time job.  Thyroid dysfunction - Recent endocrinology evaluation; awaiting further instructions regarding thyroid management. - Recent laboratory results show slightly elevated TSH and low free T4. - Not currently on thyroid medication. - Significant exhaustion present.  Rib pain and chest wall contusion - Severe rib pain following injury from tripping on stairs after taking oxycodone  for kidney stone pain. - X-ray did not show fracture; diagnosed with contusion. - Persistent lump under breast causing significant discomfort. - Pain managed with lidocaine  patches, ibuprofen   Nephrolithiasis - History of kidney stones; most recent episode occurred prior to a trip to [place]. - Fifth kidney stone, measuring 5 mm, non-obstructive but caused significant pain. - Required emergency room visits and oxycodone  for pain relief.  Headaches - Worsening headaches, attributed to weather changes. - Out of rizatriptan , her rescue medication. - Experiencing frequent headaches. - on topamax .       ROS    Objective:     BP 127/61   Pulse 61   Ht 5' 4 (1.626 m)   Wt 224 lb (101.6 kg)   LMP 02/12/2019   SpO2  100%   BMI 38.45 kg/m    Physical Exam Vitals and nursing note reviewed.  Constitutional:      Appearance: Normal appearance.  HENT:     Head: Normocephalic and atraumatic.  Eyes:     Conjunctiva/sclera: Conjunctivae normal.  Cardiovascular:     Rate and Rhythm: Normal rate and regular rhythm.  Pulmonary:     Effort: Pulmonary effort is normal.     Breath sounds: Normal breath sounds.  Skin:    General: Skin is warm and dry.  Neurological:     Mental Status: She is alert.  Psychiatric:        Mood and Affect: Mood normal.      No results found for any visits on 08/08/24.    The 10-year ASCVD risk score (Arnett DK, et al., 2019) is: 1.4%    Assessment & Plan:   Problem List Items Addressed This Visit       Cardiovascular and Mediastinum   Migraine - Primary   Relevant Medications   rizatriptan  (MAXALT -MLT) 10 MG disintegrating tablet   traMADol  (ULTRAM ) 50 MG tablet     Musculoskeletal and Integument   Primary osteoarthritis of right hip   Relevant Medications   traMADol  (ULTRAM ) 50 MG tablet     Other   Major depressive disorder, recurrent episode   Other Visit Diagnoses       Encounter for immunization       Relevant Orders   Flu vaccine trivalent PF, 6mos and older(Flulaval,Afluria,Fluarix,Fluzone) (Completed)   Pneumococcal conjugate vaccine 20-valent (Completed)  Bilateral hip pain       Relevant Medications   traMADol  (ULTRAM ) 50 MG tablet     Contusion of rib, initial encounter           Assessment and Plan Assessment & Plan Major depressive disorder, recurrent, mild Mood worsened due to rib pain and fatigue. Awaiting thyroid function feedback from endocrinologist. - Continue Lexapro  at current dose. - Await endocrinologist's feedback on thyroid function and potential treatment adjustments.  Migraine, not intractable, without status migrainosus Severe headaches likely triggered by weather changes. Out of rizatriptan . - Refilled  rizatriptan  prescription for acute migraine management. - Consider adjusting Topamax  if headaches persist or worsen.  Rib contusion Severe pain from contusion, not fracture. Pain exacerbated by sneezing, coughing, and movement. - Continue lidocaine  patches, ibuprofen , and hydrocodone  for pain management. - Use heat therapy with a heating pad or heat wrap at night. - Splint rib area during sneezing, coughing, or laughing to reduce pain.  Nephrolithiasis Recurrent kidney stones with recent 5 mm stone causing significant pain but no obstruction.    Return in about 6 months (around 02/06/2025).    Dorothyann Byars, MD [facility] Medicine at [facility]   "

## 2024-08-22 ENCOUNTER — Ambulatory Visit: Admitting: "Endocrinology

## 2024-09-12 ENCOUNTER — Encounter: Payer: Self-pay | Admitting: Gastroenterology

## 2024-09-14 ENCOUNTER — Telehealth: Payer: Self-pay | Admitting: Gastroenterology

## 2024-09-14 NOTE — Telephone Encounter (Signed)
 Good afternoon Dr. San,    I received a call from this patient stating she has a referral to us  to have a colonoscopy. Patient was last colonoscopy was done with [facility] back in 2019. Patient records are in Epic ready for you to review them. Patient is requesting to see you do to her husband seeing you. Please advise.    Thank you

## 2024-09-15 ENCOUNTER — Encounter: Payer: Self-pay | Admitting: Gastroenterology

## 2024-09-29 ENCOUNTER — Encounter

## 2024-10-13 ENCOUNTER — Encounter: Admitting: Gastroenterology

## 2025-02-06 ENCOUNTER — Ambulatory Visit: Admitting: Family Medicine
